# Patient Record
Sex: Female | Born: 1950
Health system: Southern US, Community
[De-identification: ages and names within clinical notes are randomized; demographics above are authoritative.]

## PROBLEM LIST (undated history)

## (undated) DIAGNOSIS — E079 Disorder of thyroid, unspecified: Secondary | ICD-10-CM

## (undated) DIAGNOSIS — E785 Hyperlipidemia, unspecified: Secondary | ICD-10-CM

## (undated) DIAGNOSIS — G709 Myoneural disorder, unspecified: Secondary | ICD-10-CM

## (undated) DIAGNOSIS — K219 Gastro-esophageal reflux disease without esophagitis: Secondary | ICD-10-CM

## (undated) DIAGNOSIS — H409 Unspecified glaucoma: Secondary | ICD-10-CM

## (undated) DIAGNOSIS — M1990 Unspecified osteoarthritis, unspecified site: Secondary | ICD-10-CM

## (undated) DIAGNOSIS — D259 Leiomyoma of uterus, unspecified: Secondary | ICD-10-CM

## (undated) DIAGNOSIS — H269 Unspecified cataract: Secondary | ICD-10-CM

## (undated) DIAGNOSIS — I10 Essential (primary) hypertension: Secondary | ICD-10-CM

## (undated) DIAGNOSIS — N189 Chronic kidney disease, unspecified: Secondary | ICD-10-CM

## (undated) DIAGNOSIS — E119 Type 2 diabetes mellitus without complications: Secondary | ICD-10-CM

## (undated) DIAGNOSIS — R87629 Unspecified abnormal cytological findings in specimens from vagina: Secondary | ICD-10-CM

## (undated) HISTORY — DX: Disorder of thyroid, unspecified: E07.9

## (undated) HISTORY — PX: COLONOSCOPY: SHX174

## (undated) HISTORY — DX: Unspecified cataract: H26.9

## (undated) HISTORY — DX: Hyperlipidemia, unspecified: E78.5

## (undated) HISTORY — DX: Unspecified glaucoma: H40.9

## (undated) HISTORY — DX: Unspecified osteoarthritis, unspecified site: M19.90

## (undated) HISTORY — DX: Unspecified abnormal cytological findings in specimens from vagina: R87.629

## (undated) HISTORY — DX: Myoneural disorder, unspecified: G70.9

## (undated) HISTORY — DX: Chronic kidney disease, unspecified: N18.9

## (undated) HISTORY — DX: Type 2 diabetes mellitus without complications: E11.9

---

## 2002-07-30 ENCOUNTER — Encounter: Payer: Self-pay | Admitting: Internal Medicine

## 2002-07-30 ENCOUNTER — Encounter: Admission: RE | Admit: 2002-07-30 | Discharge: 2002-07-30 | Payer: Self-pay | Admitting: Internal Medicine

## 2003-08-16 ENCOUNTER — Emergency Department (HOSPITAL_COMMUNITY): Admission: EM | Admit: 2003-08-16 | Discharge: 2003-08-17 | Payer: Self-pay | Admitting: Emergency Medicine

## 2004-05-13 ENCOUNTER — Emergency Department (HOSPITAL_COMMUNITY): Admission: EM | Admit: 2004-05-13 | Discharge: 2004-05-13 | Payer: Self-pay | Admitting: Emergency Medicine

## 2004-05-15 ENCOUNTER — Emergency Department (HOSPITAL_COMMUNITY): Admission: EM | Admit: 2004-05-15 | Discharge: 2004-05-15 | Payer: Self-pay | Admitting: Emergency Medicine

## 2004-05-19 ENCOUNTER — Emergency Department (HOSPITAL_COMMUNITY): Admission: EM | Admit: 2004-05-19 | Discharge: 2004-05-19 | Payer: Self-pay | Admitting: Emergency Medicine

## 2005-05-28 ENCOUNTER — Encounter: Admission: RE | Admit: 2005-05-28 | Discharge: 2005-05-28 | Payer: Self-pay | Admitting: Internal Medicine

## 2007-05-28 ENCOUNTER — Emergency Department (HOSPITAL_COMMUNITY): Admission: EM | Admit: 2007-05-28 | Discharge: 2007-05-28 | Payer: Self-pay | Admitting: Emergency Medicine

## 2007-07-07 ENCOUNTER — Encounter: Admission: RE | Admit: 2007-07-07 | Discharge: 2007-07-07 | Payer: Self-pay | Admitting: Internal Medicine

## 2008-08-14 ENCOUNTER — Encounter: Admission: RE | Admit: 2008-08-14 | Discharge: 2008-08-14 | Payer: Self-pay | Admitting: Obstetrics

## 2008-12-14 ENCOUNTER — Inpatient Hospital Stay (HOSPITAL_COMMUNITY): Admission: AD | Admit: 2008-12-14 | Discharge: 2008-12-14 | Payer: Self-pay | Admitting: Obstetrics

## 2008-12-14 ENCOUNTER — Ambulatory Visit: Payer: Self-pay | Admitting: Family

## 2009-02-28 ENCOUNTER — Emergency Department (HOSPITAL_COMMUNITY): Admission: EM | Admit: 2009-02-28 | Discharge: 2009-02-28 | Payer: Self-pay | Admitting: Emergency Medicine

## 2009-09-03 ENCOUNTER — Encounter: Admission: RE | Admit: 2009-09-03 | Discharge: 2009-09-03 | Payer: Self-pay | Admitting: Obstetrics

## 2010-06-04 LAB — CBC
Hemoglobin: 12.2 g/dL (ref 12.0–15.0)
RBC: 3.83 MIL/uL — ABNORMAL LOW (ref 3.87–5.11)
WBC: 5.2 10*3/uL (ref 4.0–10.5)

## 2010-07-11 ENCOUNTER — Inpatient Hospital Stay (HOSPITAL_COMMUNITY)
Admission: AD | Admit: 2010-07-11 | Discharge: 2010-07-11 | Disposition: A | Discharge status: Home / Self Care | Payer: 59 | Source: Ambulatory Visit | Attending: Obstetrics & Gynecology | Admitting: Obstetrics & Gynecology

## 2010-07-11 ENCOUNTER — Inpatient Hospital Stay (HOSPITAL_COMMUNITY): Payer: 59

## 2010-07-11 DIAGNOSIS — D259 Leiomyoma of uterus, unspecified: Secondary | ICD-10-CM

## 2010-07-11 DIAGNOSIS — R109 Unspecified abdominal pain: Secondary | ICD-10-CM

## 2010-07-11 LAB — CBC
HCT: 37.4 % (ref 36.0–46.0)
MCHC: 32.6 g/dL (ref 30.0–36.0)
Platelets: 221 10*3/uL (ref 150–400)
RDW: 14.6 % (ref 11.5–15.5)
WBC: 7.1 10*3/uL (ref 4.0–10.5)

## 2010-07-11 LAB — URINALYSIS, ROUTINE W REFLEX MICROSCOPIC
Glucose, UA: NEGATIVE mg/dL
Specific Gravity, Urine: 1.02 (ref 1.005–1.030)
pH: 6 (ref 5.0–8.0)

## 2010-07-11 LAB — URINE MICROSCOPIC-ADD ON

## 2010-08-30 ENCOUNTER — Inpatient Hospital Stay (HOSPITAL_COMMUNITY)
Admission: AD | Admit: 2010-08-30 | Discharge: 2010-08-30 | Disposition: A | Discharge status: Home / Self Care | Payer: 59 | Source: Ambulatory Visit | Attending: Obstetrics | Admitting: Obstetrics

## 2010-08-30 DIAGNOSIS — M25559 Pain in unspecified hip: Secondary | ICD-10-CM | POA: Insufficient documentation

## 2010-08-30 DIAGNOSIS — W19XXXA Unspecified fall, initial encounter: Secondary | ICD-10-CM | POA: Insufficient documentation

## 2010-09-22 ENCOUNTER — Other Ambulatory Visit: Payer: Self-pay | Admitting: Obstetrics

## 2010-09-22 DIAGNOSIS — Z1231 Encounter for screening mammogram for malignant neoplasm of breast: Secondary | ICD-10-CM

## 2010-10-02 ENCOUNTER — Ambulatory Visit
Admission: RE | Admit: 2010-10-02 | Discharge: 2010-10-02 | Disposition: A | Discharge status: Home / Self Care | Payer: 59 | Source: Ambulatory Visit | Attending: Obstetrics | Admitting: Obstetrics

## 2010-10-02 DIAGNOSIS — Z1231 Encounter for screening mammogram for malignant neoplasm of breast: Secondary | ICD-10-CM

## 2011-09-14 ENCOUNTER — Other Ambulatory Visit: Payer: Self-pay | Admitting: Internal Medicine

## 2011-09-14 DIAGNOSIS — Z780 Asymptomatic menopausal state: Secondary | ICD-10-CM

## 2011-09-16 ENCOUNTER — Ambulatory Visit
Admission: RE | Admit: 2011-09-16 | Discharge: 2011-09-16 | Disposition: A | Discharge status: Home / Self Care | Payer: 59 | Source: Ambulatory Visit | Attending: Internal Medicine | Admitting: Internal Medicine

## 2011-09-16 DIAGNOSIS — Z780 Asymptomatic menopausal state: Secondary | ICD-10-CM

## 2012-01-07 ENCOUNTER — Emergency Department (HOSPITAL_COMMUNITY)
Admission: EM | Admit: 2012-01-07 | Discharge: 2012-01-08 | Disposition: A | Discharge status: Home / Self Care | Payer: 59 | Attending: Emergency Medicine | Admitting: Emergency Medicine

## 2012-01-07 ENCOUNTER — Encounter (HOSPITAL_COMMUNITY): Payer: Self-pay | Admitting: Emergency Medicine

## 2012-01-07 DIAGNOSIS — J3489 Other specified disorders of nose and nasal sinuses: Secondary | ICD-10-CM | POA: Insufficient documentation

## 2012-01-07 DIAGNOSIS — R5381 Other malaise: Secondary | ICD-10-CM | POA: Insufficient documentation

## 2012-01-07 DIAGNOSIS — J029 Acute pharyngitis, unspecified: Secondary | ICD-10-CM | POA: Insufficient documentation

## 2012-01-07 DIAGNOSIS — I10 Essential (primary) hypertension: Secondary | ICD-10-CM | POA: Insufficient documentation

## 2012-01-07 DIAGNOSIS — R51 Headache: Secondary | ICD-10-CM | POA: Insufficient documentation

## 2012-01-07 DIAGNOSIS — J069 Acute upper respiratory infection, unspecified: Secondary | ICD-10-CM | POA: Insufficient documentation

## 2012-01-07 HISTORY — DX: Essential (primary) hypertension: I10

## 2012-01-07 NOTE — ED Notes (Signed)
C/o headache, non-productive cough, and body aches x 1 1/2 weeks.  Denies fever, nausea, and vomiting.

## 2012-01-08 MED ORDER — CETIRIZINE-PSEUDOEPHEDRINE ER 5-120 MG PO TB12: 1.0000 | ORAL_TABLET | Freq: Every day | ORAL | Status: DC

## 2012-01-08 MED ORDER — GUAIFENESIN ER 600 MG PO TB12: 1200.0000 mg | ORAL_TABLET | Freq: Two times a day (BID) | ORAL | Status: DC

## 2012-01-08 NOTE — ED Notes (Signed)
PA to bedside

## 2012-01-08 NOTE — ED Provider Notes (Signed)
History     CSN: [id number]  Arrival date & time 01/07/12  2337   First MD Initiated Contact with Patient 01/07/12 2343      Chief Complaint  Patient presents with  . Headache  . Cough   HPI  History provided by the patient. Patient is a 60 year old female with history of hypertension who presents with multiple complaints of cough, congestion, bodyaches and headache for the past week. Came on gradually and have been persistent. Patient has used Alka-Seltzer cold medicine occasionally without complete relief of symptoms. She denies any known sick contacts. Denies any recent travel. She denies any associated fever, chills, nausea or vomiting symptoms. Any chest pain or shortness of breath. Patient is nonsmoker. No prior history of asthma or bronchitis.     Past Medical History  Diagnosis Date  . Hypertension     History reviewed. No pertinent past surgical history.  No family history on file.  History  Substance Use Topics  . Smoking status: Never Smoker   . Smokeless tobacco: Not on file  . Alcohol Use: No    OB History    Grav Para Term Preterm Abortions TAB SAB Ect Mult Living                  Review of Systems  Constitutional: Positive for fatigue. Negative for fever and chills.  HENT: Positive for congestion, sore throat, rhinorrhea and sinus pressure.   Respiratory: Positive for cough. Negative for shortness of breath and wheezing.   Cardiovascular: Negative for chest pain.  Gastrointestinal: Negative for nausea, vomiting, abdominal pain and diarrhea.  Skin: Negative for rash.  Neurological: Positive for headaches.    Allergies  Review of patient's allergies indicates no known allergies.  Home Medications   Current Outpatient Rx  Name  Route  Sig  Dispense  Refill  . HYDROCHLOROTHIAZIDE 25 MG PO TABS   Oral   Take 25 mg by mouth daily.           BP 150/85  Pulse 89  Temp 99.1 F (37.3 C) (Oral)  Resp 18  SpO2 98%  Physical Exam  Nursing  note and vitals reviewed. Constitutional: She is oriented to person, place, and time. She appears well-developed and well-nourished. No distress.  HENT:  Head: Normocephalic and atraumatic.  Right Ear: Tympanic membrane normal.  Left Ear: Tympanic membrane normal.  Mouth/Throat: Oropharynx is clear and moist.  Neck: Normal range of motion. Neck supple.       No meningeal signs  Cardiovascular: Normal rate and regular rhythm.   No murmur heard. Pulmonary/Chest: Effort normal and breath sounds normal. No respiratory distress. She has no wheezes. She has no rales.  Abdominal: Soft. There is no tenderness. There is no rebound and no guarding.  Musculoskeletal: She exhibits no edema and no tenderness.  Lymphadenopathy:    She has no cervical adenopathy.  Neurological: She is alert and oriented to person, place, and time.  Skin: Skin is warm and dry. No rash noted.  Psychiatric: She has a normal mood and affect. Her behavior is normal.    ED Course  Procedures       1. URI (upper respiratory infection)       MDM  Patient seen and evaluated. Patient well-appearing and nontoxic. No concerning arrhythmia symptoms her headache.        Angus Seller, [place] 01/09/12 [phone]

## 2012-01-09 NOTE — ED Provider Notes (Signed)
Medical screening examination/treatment/procedure(s) were performed by non-physician practitioner and as supervising physician I was immediately available for consultation/collaboration.  Tobin Chad, MD 01/09/12 [phone]

## 2012-02-21 ENCOUNTER — Other Ambulatory Visit: Payer: Self-pay | Admitting: Internal Medicine

## 2012-02-21 ENCOUNTER — Other Ambulatory Visit: Payer: Self-pay | Admitting: Obstetrics

## 2012-02-21 DIAGNOSIS — Z1231 Encounter for screening mammogram for malignant neoplasm of breast: Secondary | ICD-10-CM

## 2012-03-09 ENCOUNTER — Ambulatory Visit
Admission: RE | Admit: 2012-03-09 | Discharge: 2012-03-09 | Disposition: A | Discharge status: Home / Self Care | Payer: 59 | Source: Ambulatory Visit | Attending: Internal Medicine | Admitting: Internal Medicine

## 2012-03-09 DIAGNOSIS — Z1231 Encounter for screening mammogram for malignant neoplasm of breast: Secondary | ICD-10-CM

## 2012-11-09 ENCOUNTER — Ambulatory Visit: Payer: Self-pay | Admitting: Obstetrics

## 2013-02-09 ENCOUNTER — Other Ambulatory Visit: Payer: Self-pay | Admitting: Internal Medicine

## 2013-02-09 DIAGNOSIS — Z1231 Encounter for screening mammogram for malignant neoplasm of breast: Secondary | ICD-10-CM

## 2013-02-19 ENCOUNTER — Ambulatory Visit
Admission: RE | Admit: 2013-02-19 | Discharge: 2013-02-19 | Disposition: A | Discharge status: Home / Self Care | Payer: 59 | Source: Ambulatory Visit | Attending: Internal Medicine | Admitting: Internal Medicine

## 2013-02-19 DIAGNOSIS — Z1231 Encounter for screening mammogram for malignant neoplasm of breast: Secondary | ICD-10-CM

## 2013-04-17 ENCOUNTER — Ambulatory Visit: Payer: 59 | Admitting: Obstetrics

## 2013-04-24 ENCOUNTER — Ambulatory Visit: Payer: 59 | Admitting: Obstetrics

## 2013-05-02 ENCOUNTER — Encounter: Payer: Self-pay | Admitting: Obstetrics

## 2013-05-02 ENCOUNTER — Ambulatory Visit (INDEPENDENT_AMBULATORY_CARE_PROVIDER_SITE_OTHER): Payer: 59 | Admitting: Obstetrics

## 2013-05-02 VITALS — BP 153/97 | HR 77 | Temp 98.6°F | Ht 63.0 in | Wt 188.0 lb

## 2013-05-02 DIAGNOSIS — Z113 Encounter for screening for infections with a predominantly sexual mode of transmission: Secondary | ICD-10-CM

## 2013-05-02 DIAGNOSIS — Z01419 Encounter for gynecological examination (general) (routine) without abnormal findings: Secondary | ICD-10-CM

## 2013-05-02 DIAGNOSIS — Z124 Encounter for screening for malignant neoplasm of cervix: Secondary | ICD-10-CM

## 2013-05-02 NOTE — Progress Notes (Signed)
Subjective:     Kristi Campos is a 63 y.o. female here for a routine exam.  Current complaints: Patient is in the office today for an Annual Exam. Patient state she would like her colon checked. Patient states she would like to have her cholesterol checked.   Personal health questionnaire reviewed: yes.   Gynecologic History No LMP recorded. Patient is postmenopausal. Contraception: post menopausal status Last Pap: 05/14/2011. Results were: abnormal ([place]) Last mammogram: 2014. Results were: normal  Obstetric History OB History  Gravida Para Term Preterm AB SAB TAB Ectopic Multiple Living  0 0 0 0 0 0 0 0 0 0          The following portions of the patient's history were reviewed and updated as appropriate: allergies, current medications, past family history, past medical history, past social history, past surgical history and problem list.  Review of Systems Pertinent items are noted in HPI.    Objective:    General appearance: alert and no distress Breasts: normal appearance, no masses or tenderness Abdomen: normal findings: soft, non-tender Pelvic: external genitalia normal, no adnexal masses or tenderness, no cervical motion tenderness, rectovaginal septum normal, uterus normal size, shape, and consistency and vagina normal without discharge  cervical os stenosed.  Assessment:    Healthy female exam.    Plan:    Follow up in: 1 year.

## 2013-05-03 LAB — GC/CHLAMYDIA PROBE AMP
CT Probe RNA: NEGATIVE
GC PROBE AMP APTIMA: NEGATIVE

## 2013-05-03 LAB — WET PREP BY MOLECULAR PROBE
CANDIDA SPECIES: NEGATIVE
Gardnerella vaginalis: POSITIVE — AB
Trichomonas vaginosis: NEGATIVE

## 2013-05-04 LAB — PAP IG W/ RFLX HPV ASCU

## 2013-05-08 ENCOUNTER — Other Ambulatory Visit: Payer: Self-pay | Admitting: *Deleted

## 2013-05-08 DIAGNOSIS — N760 Acute vaginitis: Secondary | ICD-10-CM

## 2013-05-08 DIAGNOSIS — B9689 Other specified bacterial agents as the cause of diseases classified elsewhere: Secondary | ICD-10-CM

## 2013-05-08 MED ORDER — METRONIDAZOLE 500 MG PO TABS
500.0000 mg | ORAL_TABLET | Freq: Two times a day (BID) | ORAL | Status: DC
Start: 2013-05-08 — End: 2013-07-03

## 2013-06-15 ENCOUNTER — Other Ambulatory Visit: Payer: Self-pay | Admitting: Internal Medicine

## 2013-06-15 DIAGNOSIS — E2839 Other primary ovarian failure: Secondary | ICD-10-CM

## 2013-07-03 ENCOUNTER — Emergency Department (HOSPITAL_COMMUNITY): Payer: 59

## 2013-07-03 ENCOUNTER — Emergency Department (HOSPITAL_COMMUNITY)
Admission: EM | Admit: 2013-07-03 | Discharge: 2013-07-03 | Disposition: A | Discharge status: Home / Self Care | Payer: 59 | Attending: Emergency Medicine | Admitting: Emergency Medicine

## 2013-07-03 ENCOUNTER — Encounter (HOSPITAL_COMMUNITY): Payer: Self-pay | Admitting: Emergency Medicine

## 2013-07-03 DIAGNOSIS — K5900 Constipation, unspecified: Secondary | ICD-10-CM | POA: Insufficient documentation

## 2013-07-03 DIAGNOSIS — Z79899 Other long term (current) drug therapy: Secondary | ICD-10-CM | POA: Insufficient documentation

## 2013-07-03 DIAGNOSIS — I10 Essential (primary) hypertension: Secondary | ICD-10-CM | POA: Insufficient documentation

## 2013-07-03 DIAGNOSIS — Z7982 Long term (current) use of aspirin: Secondary | ICD-10-CM | POA: Insufficient documentation

## 2013-07-03 LAB — CBC WITH DIFFERENTIAL/PLATELET
BASOS ABS: 0 10*3/uL (ref 0.0–0.1)
Basophils Relative: 0 % (ref 0–1)
EOS ABS: 0.1 10*3/uL (ref 0.0–0.7)
EOS PCT: 2 % (ref 0–5)
HCT: 37.5 % (ref 36.0–46.0)
Hemoglobin: 12.8 g/dL (ref 12.0–15.0)
LYMPHS ABS: 1.7 10*3/uL (ref 0.7–4.0)
Lymphocytes Relative: 25 % (ref 12–46)
MCH: 31.3 pg (ref 26.0–34.0)
MCHC: 34.1 g/dL (ref 30.0–36.0)
MCV: 91.7 fL (ref 78.0–100.0)
Monocytes Absolute: 0.5 10*3/uL (ref 0.1–1.0)
Monocytes Relative: 8 % (ref 3–12)
NEUTROS PCT: 65 % (ref 43–77)
Neutro Abs: 4.4 10*3/uL (ref 1.7–7.7)
PLATELETS: 258 10*3/uL (ref 150–400)
RBC: 4.09 MIL/uL (ref 3.87–5.11)
RDW: 14.8 % (ref 11.5–15.5)
WBC: 6.7 10*3/uL (ref 4.0–10.5)

## 2013-07-03 LAB — COMPREHENSIVE METABOLIC PANEL
ALBUMIN: 3.6 g/dL (ref 3.5–5.2)
ALK PHOS: 70 U/L (ref 39–117)
ALT: 23 U/L (ref 0–35)
AST: 24 U/L (ref 0–37)
BUN: 31 mg/dL — ABNORMAL HIGH (ref 6–23)
CALCIUM: 9.6 mg/dL (ref 8.4–10.5)
CO2: 22 mEq/L (ref 19–32)
Chloride: 104 mEq/L (ref 96–112)
Creatinine, Ser: 0.94 mg/dL (ref 0.50–1.10)
GFR calc Af Amer: 73 mL/min — ABNORMAL LOW (ref >90)
GFR calc non Af Amer: 63 mL/min — ABNORMAL LOW (ref >90)
Glucose, Bld: 152 mg/dL — ABNORMAL HIGH (ref 70–99)
Potassium: 3.9 mEq/L (ref 3.7–5.3)
SODIUM: 140 meq/L (ref 137–147)
TOTAL PROTEIN: 7.9 g/dL (ref 6.0–8.3)
Total Bilirubin: 0.4 mg/dL (ref 0.3–1.2)

## 2013-07-03 LAB — URINALYSIS, ROUTINE W REFLEX MICROSCOPIC
BILIRUBIN URINE: NEGATIVE
GLUCOSE, UA: NEGATIVE mg/dL
HGB URINE DIPSTICK: NEGATIVE
KETONES UR: NEGATIVE mg/dL
Leukocytes, UA: NEGATIVE
Nitrite: NEGATIVE
PH: 5.5 (ref 5.0–8.0)
Protein, ur: NEGATIVE mg/dL
SPECIFIC GRAVITY, URINE: 1.023 (ref 1.005–1.030)
Urobilinogen, UA: 0.2 mg/dL (ref 0.0–1.0)

## 2013-07-03 MED ORDER — POLYETHYLENE GLYCOL 3350 17 G PO PACK: 17.0000 g | PACK | Freq: Two times a day (BID) | ORAL | Status: DC

## 2013-07-03 MED ORDER — DOCUSATE SODIUM 100 MG PO CAPS: 100.0000 mg | ORAL_CAPSULE | Freq: Two times a day (BID) | ORAL | Status: DC

## 2013-07-03 NOTE — ED Notes (Signed)
Pt. reports low abdominal pain onset 3 days ago , denies nausea, vomitting or diarrhea . No fever or chills.

## 2013-07-03 NOTE — Discharge Instructions (Signed)
Your abdominal xray showed constipation. Your urine was normal. You likely have pain from your constipation. Take miralax as directed until you are stooling normally. Take colace as needed as well. Stay hydrated and eat foods high in fiber. Seek immediate care if you develop worsening symptoms, nausea, vomiting, fever, or other concerns. Follow up with your PCP.   Constipation, Adult Constipation is when a person:  Poops (bowel movement) less than 3 times a week.  Has a hard time pooping.  Has poop that is dry, hard, or bigger than normal. HOME CARE   Eat more fiber, such as fruits, vegetables, whole grains like brown rice, and beans.  Eat less fatty foods and sugar. This includes [place] fries, hamburgers, cookies, candy, and soda.  If you are not getting enough fiber from food, take products with added fiber in them (supplements).  Drink enough fluid to keep your pee (urine) clear or pale yellow.  Go to the restroom when you feel like you need to poop. Do not hold it.  Only take medicine as told by your doctor. Do not take medicines that help you poop (laxatives) without talking to your doctor first.  Exercise on a regular basis, or as told by your doctor. GET HELP RIGHT AWAY IF:   You have bright red blood in your poop (stool).  Your constipation lasts more than 4 days or gets worse.  You have belly (abdomen) or butt (rectal) pain.  You have thin poop (as thin as a pencil).  You lose weight, and it cannot be explained. MAKE SURE YOU:   Understand these instructions.  Will watch your condition.  Will get help right away if you are not doing well or get worse. Document Released: 08/04/2007 Document Revised: 05/10/2011 Document Reviewed: 11/27/2012 [facility] Patient Information 2014 [place], [place].

## 2013-07-03 NOTE — ED Provider Notes (Signed)
CSN: [id number]     Arrival date & time 07/03/13  5732 History   First MD Initiated Contact with Patient 07/03/13 [phone]     Chief Complaint  Patient presents with  . Abdominal Pain   HPI  Bilateral lower abdominal pain for the past 3 days that is 5/10 and "throbbing." Patient denies fevers, chills, chest pain, dyspnea, diarrhea, vomiting/nausea, or dysuria. No radiation of pain to groin, back, or chest. No skin changes. No hematuria or bloody stool. She has had firm stools which is her baseline, with last stool yesterday that was firm and she had to strain to get out. Pain is worse with standing long periods of time (at work) but she otherwise notes no pattern with time of day or food intake. Denies vaginal bleeding and does not report any vaginal discharge. Only medicines are aspirin and HCTZ, with no recent new medicines.  Past Medical History  Diagnosis Date  . Hypertension   "Thyroid problem"  History reviewed. No pertinent past surgical history. No family history on file. History  Substance Use Topics  . Smoking status: Never Smoker   . Smokeless tobacco: Never Used  . Alcohol Use: No   OB History   Grav Para Term Preterm Abortions TAB SAB Ect Mult Living   0 0 0 0 0 0 0 0 0 0      Review of Systems Per HPI  Allergies  Review of patient's allergies indicates no known allergies.  Home Medications   Prior to Admission medications   Medication Sig Start Date End Date Taking? Authorizing Provider  aspirin EC 81 MG tablet Take 81 mg by mouth daily.   Yes Historical Provider, MD  hydrochlorothiazide (HYDRODIURIL) 25 MG tablet Take 25 mg by mouth daily.   Yes Historical Provider, MD   BP 114/77  Pulse 86  Temp(Src) 98 F (36.7 C) (Oral)  Ht 5\' 3"  (1.6 m)  Wt 185 lb (83.915 kg)  BMI 32.78 kg/m2  SpO2 100% Physical Exam GEN: NAD, pleasant HEENT: Atraumatic, normocephalic, neck supple, EOMI, sclera clear, o/p clear, MMM CV: RRR, no murmurs, rubs, or gallops PULM: CTAB,  normal effort ABD: Soft, nontender, nondistended, obese, NABS, no organomegaly, no suprapubic tenderenss SKIN: No rash or cyanosis; warm and well-perfused PSYCH: Mood and affect euthymic, normal rate and volume of speech NEURO: Awake, alert, no focal deficits grossly, normal speech, normal gait BACK: No CVA tenderness, no erythema or deformity  ED Course  Procedures (including critical care time) Labs Review Labs Reviewed  COMPREHENSIVE METABOLIC PANEL - Abnormal; Notable for the following:    Glucose, Bld 152 (*)    BUN 31 (*)    GFR calc non Af Amer 63 (*)    GFR calc Af Amer 73 (*)    All other components within normal limits  CBC WITH DIFFERENTIAL  URINALYSIS, ROUTINE W REFLEX MICROSCOPIC    Imaging Review Dg Abd 2 Views  07/03/2013   CLINICAL DATA:  Abdominal pain.  EXAM: ABDOMEN - 2 VIEW  COMPARISON:  None.  FINDINGS: Large stool burden throughout the colon. Nonobstructive bowel gas pattern. No free air organomegaly. No suspicious calcification. Visualized lung bases are clear. No acute bony abnormality.  IMPRESSION: Large stool burden.  No acute findings.   Electronically Signed   By: Rolm Baptise M.D.   On: 07/03/2013 09:18     EKG Interpretation None      MDM   Final diagnoses:  None   Subacute abdominal pain in pt with HTN  and hypothyroidism, Nonacute abdomen, no fevers/chills or other systemic symptoms, no nausea/vomiting/diarrhea to raise concern for infective process. DDx includes constipation, UTI (though no dysuria), viral gastroenteritis (though no diarrhea/vomiting or fever), abdominal ligament strain due to pannus, HHS (glucose only 152, no n/v). Normal CMET and WBC. -UA>>negative -ABd Xray>>large stool burden -D/c from ED with Colace+miralax daily prn until stooling normally, and increase fiber content -Core strengthening -F/u with PCP if no better  Hilton Sinclair, MD PGY-2, [facility]     Hilton Sinclair, MD 07/03/13  [phone]

## 2013-07-03 NOTE — ED Provider Notes (Signed)
I performed a history and physical examination of  Kristi Campos and discussed her management with resident physician. I agree with the history, physical, assessment, and plan of care, with the following exceptions: None I was present for the following procedures: None  Time Spent in Critical Care of the patient: None  Time spent in discussions with the patient and family: 15 min  Kobyn Kray Joni Fears, MD 07/04/13 1701

## 2013-07-04 NOTE — ED Provider Notes (Signed)
I saw and evaluated the patient, reviewed the resident's note and I agree with the findings and plan.   EKG Interpretation None      Pt with abd pain. Non peritoneal exam. Reports being constipated - and Xrays ordered, which confirm high stool burden. She is over all healthy, and her vitals are stable, so we don't see any reason to get CT scans.   Varney Biles, MD 07/04/13 [phone]

## 2013-08-09 ENCOUNTER — Ambulatory Visit (HOSPITAL_BASED_OUTPATIENT_CLINIC_OR_DEPARTMENT_OTHER)
Admission: RE | Admit: 2013-08-09 | Discharge: 2013-08-09 | Disposition: A | Discharge status: Home / Self Care | Payer: 59 | Source: Ambulatory Visit | Attending: Orthopedic Surgery | Admitting: Orthopedic Surgery

## 2013-08-09 ENCOUNTER — Encounter (HOSPITAL_BASED_OUTPATIENT_CLINIC_OR_DEPARTMENT_OTHER): Payer: Self-pay | Admitting: *Deleted

## 2013-08-09 ENCOUNTER — Encounter (HOSPITAL_BASED_OUTPATIENT_CLINIC_OR_DEPARTMENT_OTHER)
Admission: RE | Disposition: A | Discharge status: Home / Self Care | Payer: Self-pay | Source: Ambulatory Visit | Attending: Orthopedic Surgery

## 2013-08-09 DIAGNOSIS — L03019 Cellulitis of unspecified finger: Secondary | ICD-10-CM | POA: Insufficient documentation

## 2013-08-09 DIAGNOSIS — I10 Essential (primary) hypertension: Secondary | ICD-10-CM | POA: Insufficient documentation

## 2013-08-09 HISTORY — PX: I & D EXTREMITY: SHX5045

## 2013-08-09 SURGERY — MINOR IRRIGATION AND DEBRIDEMENT EXTREMITY
Anesthesia: LOCAL | Site: Finger | Laterality: Right

## 2013-08-09 MED ORDER — LIDOCAINE HCL 2 % IJ SOLN
INTRAMUSCULAR | Status: DC | PRN
  Administered 2013-08-09: 6 mL

## 2013-08-09 MED ORDER — ACETAMINOPHEN-CODEINE 300-30 MG PO TABS: ORAL_TABLET | ORAL | Status: DC

## 2013-08-09 MED ORDER — DOXYCYCLINE HYCLATE 100 MG PO TABS: 100.0000 mg | ORAL_TABLET | Freq: Two times a day (BID) | ORAL | Status: DC

## 2013-08-09 MED ORDER — METHYLPREDNISOLONE ACETATE 40 MG/ML IJ SUSP
INTRAMUSCULAR | Status: AC
  Filled 2013-08-09: qty 1

## 2013-08-09 SURGICAL SUPPLY — 51 items
BANDAGE ADH SHEER 1  50/CT (GAUZE/BANDAGES/DRESSINGS) IMPLANT
BANDAGE COBAN STERILE 2 (GAUZE/BANDAGES/DRESSINGS) IMPLANT
BLADE SURG 15 STRL LF DISP TIS (BLADE) ×1 IMPLANT
BLADE SURG 15 STRL SS (BLADE) ×2
BNDG CMPR 9X4 STRL LF SNTH (GAUZE/BANDAGES/DRESSINGS)
BNDG CMPR MD 5X2 ELC HKLP STRL (GAUZE/BANDAGES/DRESSINGS)
BNDG COHESIVE 1X5 TAN STRL LF (GAUZE/BANDAGES/DRESSINGS) ×2 IMPLANT
BNDG COHESIVE 4X5 TAN STRL (GAUZE/BANDAGES/DRESSINGS) IMPLANT
BNDG ELASTIC 2 VLCR STRL LF (GAUZE/BANDAGES/DRESSINGS) IMPLANT
BNDG ESMARK 4X9 LF (GAUZE/BANDAGES/DRESSINGS) IMPLANT
BRUSH SCRUB EZ PLAIN DRY (MISCELLANEOUS) ×2 IMPLANT
CORDS BIPOLAR (ELECTRODE) IMPLANT
COVER MAYO STAND STRL (DRAPES) ×2 IMPLANT
CUFF TOURNIQUET SINGLE 18IN (TOURNIQUET CUFF) IMPLANT
DECANTER SPIKE VIAL GLASS SM (MISCELLANEOUS) IMPLANT
DRAIN PENROSE 1/2X12 LTX STRL (WOUND CARE) ×2 IMPLANT
DRAIN PENROSE 1/4X12 LTX STRL (WOUND CARE) IMPLANT
DRAPE SURG 17X23 STRL (DRAPES) IMPLANT
DRAPE U 20/CS (DRAPES) IMPLANT
DRAPE U-SHAPE 47X51 STRL (DRAPES) IMPLANT
DRAPE U-SHAPE 76X120 STRL (DRAPES) IMPLANT
DRSG PAD ABDOMINAL 8X10 ST (GAUZE/BANDAGES/DRESSINGS) IMPLANT
GAUZE SPONGE 4X4 12PLY STRL (GAUZE/BANDAGES/DRESSINGS) IMPLANT
GAUZE XEROFORM 1X8 LF (GAUZE/BANDAGES/DRESSINGS) ×2 IMPLANT
GLOVE BIO SURGEON STRL SZ 6.5 (GLOVE) ×2 IMPLANT
GLOVE BIOGEL M STRL SZ7.5 (GLOVE) ×2 IMPLANT
GLOVE BIOGEL PI IND STRL 7.0 (GLOVE) ×1 IMPLANT
GLOVE BIOGEL PI INDICATOR 7.0 (GLOVE) ×1
GLOVE ECLIPSE 6.5 STRL STRAW (GLOVE) ×2 IMPLANT
GLOVE ORTHO TXT STRL SZ7.5 (GLOVE) ×2 IMPLANT
GOWN STRL REUS W/ TWL LRG LVL3 (GOWN DISPOSABLE) ×2 IMPLANT
GOWN STRL REUS W/ TWL XL LVL3 (GOWN DISPOSABLE) ×1 IMPLANT
GOWN STRL REUS W/TWL LRG LVL3 (GOWN DISPOSABLE) ×4
GOWN STRL REUS W/TWL XL LVL3 (GOWN DISPOSABLE) ×2
NEEDLE 27GAX1X1/2 (NEEDLE) ×2 IMPLANT
PACK BASIN DAY SURGERY FS (CUSTOM PROCEDURE TRAY) ×2 IMPLANT
PADDING CAST ABS 4INX4YD NS (CAST SUPPLIES)
PADDING CAST ABS COTTON 4X4 ST (CAST SUPPLIES) IMPLANT
SPONGE GAUZE 4X4 12PLY STER LF (GAUZE/BANDAGES/DRESSINGS) IMPLANT
STOCKINETTE 4X48 STRL (DRAPES) ×2 IMPLANT
STRIP CLOSURE SKIN 1/2X4 (GAUZE/BANDAGES/DRESSINGS) IMPLANT
SUT ETHILON 5 0 P 3 18 (SUTURE) ×1
SUT NYLON ETHILON 5-0 P-3 1X18 (SUTURE) ×1 IMPLANT
SUT PROLENE 3 0 PS 2 (SUTURE) IMPLANT
SWAB COLLECTION DEVICE MRSA (MISCELLANEOUS) ×2 IMPLANT
SYR 3ML 23GX1 SAFETY (SYRINGE) IMPLANT
SYR CONTROL 10ML LL (SYRINGE) ×2 IMPLANT
TOWEL OR 17X24 6PK STRL BLUE (TOWEL DISPOSABLE) ×4 IMPLANT
TRAY DSU PREP LF (CUSTOM PROCEDURE TRAY) ×2 IMPLANT
TUBE ANAEROBIC SPECIMEN COL (MISCELLANEOUS) ×2 IMPLANT
UNDERPAD 30X30 INCONTINENT (UNDERPADS AND DIAPERS) ×2 IMPLANT

## 2013-08-09 NOTE — Discharge Instructions (Signed)

## 2013-08-09 NOTE — H&P (Signed)
  [name] is a 62 year old right-hand-dominant female who telephoned our office on the morning of 08/09/2013. She stated that she had a finger that had become infected approximately 3-4 days ago. She has seen no other medical provider concerning this finger and requested that we see her and evaluate her for treatment of this infection. Patient states that approximately one week ago she the cuticle of the right finger several days later this became infected. This is apparently waking her up at night due to the pain. After telephoning [place] this morning we asked her to in day surgery Center for this infection.  Allergies: No known drug allergies Review of systems: Patient has no history of diabetes or thyroid disease. There is a history of hypertension which is treated. 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 She does not smoke or drink alcohol.  Exam: Examination of her right long finger reveals a stage III paronychia of the medial crura of the finger distal phalanx. There is no tenderness along the flexor tendon area just with motion of the MP and PIP joints. Vascular she is intact.  Cardiac: Patient has a regular rate and rhythm without murmur Lungs clear  Impression: Paronychia of right long finger  Plan: Patient to be taken to the operating room to undergo sedation and drainage with intraoperative cultures of the right long finger paronychia. The procedure risks benefits and postoperative course was discussed with the patient at length.  Julian Reil PA-C  H&P documentation: 08/09/2013  -History and Physical Reviewed  -Patient has been re-examined  -No change in the plan of care  Cammie Sickle, MD

## 2013-08-09 NOTE — Brief Op Note (Signed)
08/09/2013  4:31 PM  PATIENT:  Kristi Campos  63 y.o. female  PRE-OPERATIVE DIAGNOSIS:  Infected Right Long Finger  POST-OPERATIVE DIAGNOSIS:  infected right long finger  PROCEDURE:  Procedure(s) with comments: MINOR IRRIGATION AND DEBRIDEMENT EXTREMITY (Right) - long       wound class 4  SURGEON:  Surgeon(s) and Role:    * Cammie Sickle, MD - Primary  PHYSICIAN ASSISTANT:   ASSISTANTS: Kathyrn Sheriff.A-C    ANESTHESIA:   local  EBL:     BLOOD ADMINISTERED:none  DRAINS: xeroflo   LOCAL MEDICATIONS USED:  LIDOCAINE   SPECIMEN:  No Specimen  DISPOSITION OF SPECIMEN:  N/A  COUNTS:  YES  TOURNIQUET:    DICTATION: .Other Dictation: Dictation Number [id number]  PLAN OF CARE: Discharge to home after PACU  PATIENT DISPOSITION:  PACU - hemodynamically stable.   Delay start of Pharmacological VTE agent (>24hrs) due to surgical blood loss or risk of bleeding: not applicable

## 2013-08-09 NOTE — Op Note (Signed)
[id number] 

## 2013-08-10 NOTE — Op Note (Signed)
[name], [name]              ACCOUNT NO.:  [id number]  MEDICAL RECORD NO.:  [id number]  LOCATION:                                 FACILITY:  PHYSICIAN:  Youlanda Mighty. Gilberta Peeters, M.D. 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  OPERATION:  Incision and drainage of nail fold abscess, right long finger with aerobic and anaerobic cultures and Gram stain followed by placement of a Xeroflo drain.  OPERATING SURGEON:  Youlanda Mighty. Trenity Pha, M.D.  ASSISTANT:  Marily Lente. Dasnoit, PA-C  ANESTHESIA:  2% lidocaine metacarpal head level block of right long finger.  This was performed as a minor operating room procedure at the [facility].  INDICATIONS:  Kristi Campos is a 63 year old woman who was referred by her primary care physician for evaluation and management of a right long finger acute bacterial paronychia.  She had a large collection of purulent material along the radial nail wall of her right long finger. She was advised by our office to proceed directly to the [facility] for treatment by our on-call physician.  She was noted to have a stage III paronychia.  She reported that she was on hydrochlorothiazide for high blood pressure, did not have antibiotic allergies.  She had no treatment of her predicament with medications to date.  After detailed informed consent, her right long finger was marked as a proper surgical site per protocol with marking pen.  She was then transferred to room 1 of the [facility] and placed in a supine position upon the operating table.  Following Betadine prep of the right hand, 2% lidocaine was infiltrated at the metacarpal head level to obtain a digital block.  After 10 minutes,  excellent anesthesia was achieved.  The right hand and arm were then prepped with Betadine soap and solution, sterilely draped. The long finger was exsanguinated with direct compression and a 0.5-inch Penrose drain was placed at the proximal phalangeal segment as a digital tourniquet.  A routine surgical time-out was accomplished.  We then proceeded to place a Freer elevator deep to the nail plate.  Purulent material was immediately recovered.  This was cultured for aerobic and anaerobic growth and a Gram stain was obtained.  The radial 4 mm of nail plate was then removed beneath the nail fold. There was a large abscess cavity noted deep to the dorsal nail fold. There was also an area that extended proximally along the radial aspect of the finger stopping just short of the DIP joint capsule.  This was curetted and irrigated and subsequently drained with a wick of Xeroflo placed to the depth of the wound.  The wound was dressed open.  We then dressed the finger with Xeroflo, sterile gauze, and Coban wrist base dressing.  There were no apparent complications.  For aftercare, Kristi Campos was provided prescription for Tylenol with Codeine No. 3, one or two tablets p.o. q.4-6 hours p.r.n. pain, 20 tabs  without refill.  Also, she is provided a prescription for doxycycline 100 mg 1 p.o. b.i.d. x4 days as a therapeutic antibiotic.     Youlanda Mighty Sharol Croghan, M.D.     RVS/MEDQ  D:  08/09/2013  T:  08/10/2013  Job:  [id number]

## 2013-08-12 LAB — WOUND CULTURE

## 2013-08-13 ENCOUNTER — Encounter (HOSPITAL_BASED_OUTPATIENT_CLINIC_OR_DEPARTMENT_OTHER): Payer: Self-pay | Admitting: Orthopedic Surgery

## 2013-08-14 LAB — ANAEROBIC CULTURE

## 2013-09-17 ENCOUNTER — Encounter (INDEPENDENT_AMBULATORY_CARE_PROVIDER_SITE_OTHER): Payer: Self-pay

## 2013-09-17 ENCOUNTER — Ambulatory Visit
Admission: RE | Admit: 2013-09-17 | Discharge: 2013-09-17 | Disposition: A | Discharge status: Home / Self Care | Payer: 59 | Source: Ambulatory Visit | Attending: Internal Medicine | Admitting: Internal Medicine

## 2013-09-17 DIAGNOSIS — E2839 Other primary ovarian failure: Secondary | ICD-10-CM

## 2014-01-09 ENCOUNTER — Other Ambulatory Visit: Payer: Self-pay | Admitting: Internal Medicine

## 2014-05-06 ENCOUNTER — Ambulatory Visit: Payer: 59 | Admitting: Obstetrics

## 2014-05-21 ENCOUNTER — Other Ambulatory Visit: Payer: Self-pay

## 2014-05-21 DIAGNOSIS — Z1231 Encounter for screening mammogram for malignant neoplasm of breast: Secondary | ICD-10-CM

## 2014-05-24 ENCOUNTER — Ambulatory Visit
Admission: RE | Admit: 2014-05-24 | Discharge: 2014-05-24 | Disposition: A | Discharge status: Home / Self Care | Payer: 59 | Source: Ambulatory Visit

## 2014-05-24 DIAGNOSIS — Z1231 Encounter for screening mammogram for malignant neoplasm of breast: Secondary | ICD-10-CM

## 2014-10-30 ENCOUNTER — Encounter: Payer: Self-pay | Admitting: Obstetrics

## 2014-10-30 ENCOUNTER — Ambulatory Visit (INDEPENDENT_AMBULATORY_CARE_PROVIDER_SITE_OTHER): Payer: 59 | Admitting: Obstetrics

## 2014-10-30 VITALS — BP 129/87 | HR 76 | Temp 97.2°F | Ht 63.0 in | Wt 192.0 lb

## 2014-10-30 DIAGNOSIS — Z01419 Encounter for gynecological examination (general) (routine) without abnormal findings: Secondary | ICD-10-CM | POA: Diagnosis not present

## 2014-10-30 DIAGNOSIS — J029 Acute pharyngitis, unspecified: Secondary | ICD-10-CM | POA: Diagnosis not present

## 2014-10-30 DIAGNOSIS — Z124 Encounter for screening for malignant neoplasm of cervix: Secondary | ICD-10-CM

## 2014-10-30 DIAGNOSIS — R059 Cough, unspecified: Secondary | ICD-10-CM

## 2014-10-30 DIAGNOSIS — J069 Acute upper respiratory infection, unspecified: Secondary | ICD-10-CM | POA: Diagnosis not present

## 2014-10-30 DIAGNOSIS — R05 Cough: Secondary | ICD-10-CM

## 2014-10-30 LAB — POCT RAPID STREP A (OFFICE): Rapid Strep A Screen: NEGATIVE

## 2014-10-30 MED ORDER — GUAIFENESIN-CODEINE 100-10 MG/5ML PO SYRP: 5.0000 mL | ORAL_SOLUTION | Freq: Three times a day (TID) | ORAL | Status: DC | PRN

## 2014-10-30 MED ORDER — AZITHROMYCIN 250 MG PO TABS: ORAL_TABLET | ORAL | Status: DC

## 2014-10-30 NOTE — Progress Notes (Signed)
Subjective:        Kristi Campos is a 64 y.o. female here for a routine exam.  Current complaints: URI with sore throat.    Personal health questionnaire:  Is patient Ashkenazi Jewish, have a family history of breast and/or ovarian cancer: no Is there a family history of uterine cancer diagnosed at age < 29, gastrointestinal cancer, urinary tract cancer, family member who is a Field seismologist syndrome-associated carrier: no Is the patient overweight and hypertensive, family history of diabetes, personal history of gestational diabetes, preeclampsia or PCOS: no Is patient over 79, have PCOS,  family history of premature CHD under age 53, diabetes, smoke, have hypertension or peripheral artery disease:  no At any time, has a partner hit, kicked or otherwise hurt or frightened you?: no Over the past 2 weeks, have you felt down, depressed or hopeless?: no Over the past 2 weeks, have you felt little interest or pleasure in doing things?:no   Gynecologic History No LMP recorded. Patient is postmenopausal. Contraception: post menopausal status Last Pap: 2015. Results were: normal Last mammogram: 2016. Results were: normal  Obstetric History OB History  Gravida Para Term Preterm AB SAB TAB Ectopic Multiple Living  0 0 0 0 0 0 0 0 0 0         Past Medical History  Diagnosis Date  . Hypertension   . Diabetes mellitus without complication     Past Surgical History  Procedure Laterality Date  . I&d extremity Right 08/09/2013    Procedure: MINOR IRRIGATION AND DEBRIDEMENT EXTREMITY;  Surgeon: Cammie Sickle, MD;  Location: [facility];  Service: Orthopedics;  Laterality: Right;  long       wound class 4     Current outpatient prescriptions:  .  aspirin EC 81 MG tablet, Take 81 mg by mouth daily., Disp: , Rfl:  .  losartan-hydrochlorothiazide (HYZAAR) 100-12.5 MG per tablet, , Disp: , Rfl:  .  metFORMIN (GLUCOPHAGE) 500 MG tablet, Take 500 mg by mouth 2 (two) times daily  with a meal., Disp: , Rfl:  .  polyethylene glycol (MIRALAX / GLYCOLAX) packet, Take 17 g by mouth 2 (two) times daily. Until stooling regularly, Disp: 30 packet, Rfl: 0 No Known Allergies  Social History  Substance Use Topics  . Smoking status: Never Smoker   . Smokeless tobacco: Never Used  . Alcohol Use: No    History reviewed. No pertinent family history.    Review of Systems  Constitutional: negative for fatigue and weight loss Respiratory: negative for cough and wheezing Cardiovascular: negative for chest pain, fatigue and palpitations Gastrointestinal: negative for abdominal pain and change in bowel habits Musculoskeletal:negative for myalgias Neurological: negative for gait problems and tremors Behavioral/Psych: negative for abusive relationship, depression Endocrine: negative for temperature intolerance   Genitourinary:negative for abnormal menstrual periods, genital lesions, hot flashes, sexual problems and vaginal discharge Integument/breast: negative for breast lump, breast tenderness, nipple discharge and skin lesion(s)    Objective:       BP 129/87 mmHg  Pulse 76  Temp(Src) 97.2 F (36.2 C)  Ht 5\' 3"  (1.6 m)  Wt 192 lb (87.091 kg)  BMI 34.02 kg/m2 General:   alert  Skin:   no rash or abnormalities  Lungs:   clear to auscultation bilaterally  Heart:   regular rate and rhythm, S1, S2 normal, no murmur, click, rub or gallop  Breasts:   normal without suspicious masses, skin or nipple changes or axillary nodes  Abdomen:  normal  findings: no organomegaly, soft, non-tender and no hernia  Pelvis:  External genitalia: normal general appearance Urinary system: urethral meatus normal and bladder without fullness, nontender Vaginal: normal without tenderness, induration or masses Cervix: normal appearance Adnexa: normal bimanual exam Uterus: anteverted and non-tender, normal size   Lab Review Urine pregnancy test Labs reviewed yes Radiologic studies reviewed  yes    Assessment:    Healthy female exam.    Plan:    Education reviewed: calcium supplements, low fat, low cholesterol diet, self breast exams and weight bearing exercise. Follow up in: 1 year.   Meds ordered this encounter  Medications  . metFORMIN (GLUCOPHAGE) 500 MG tablet    Sig: Take 500 mg by mouth 2 (two) times daily with a meal.  . losartan-hydrochlorothiazide (HYZAAR) 100-12.5 MG per tablet    Sig:    Orders Placed This Encounter  Procedures  . POCT rapid strep A

## 2014-11-01 LAB — PAP, TP IMAGING W/ HPV RNA, RFLX HPV TYPE 16,18/45: HPV mRNA, High Risk: NOT DETECTED

## 2014-11-04 LAB — SURESWAB, VAGINOSIS/VAGINITIS PLUS
Atopobium vaginae: NOT DETECTED Log (cells/mL)
C. TROPICALIS, DNA: NOT DETECTED
C. albicans, DNA: NOT DETECTED
C. glabrata, DNA: NOT DETECTED
C. parapsilosis, DNA: NOT DETECTED
C. trachomatis RNA, TMA: NOT DETECTED
GARDNERELLA VAGINALIS: NOT DETECTED Log (cells/mL)
LACTOBACILLUS SPECIES: 7.6 Log (cells/mL)
MEGASPHAERA SPECIES: NOT DETECTED Log (cells/mL)
N. gonorrhoeae RNA, TMA: NOT DETECTED
T. vaginalis RNA, QL TMA: NOT DETECTED

## 2014-12-18 ENCOUNTER — Encounter (HOSPITAL_COMMUNITY): Payer: Self-pay | Admitting: Emergency Medicine

## 2014-12-18 ENCOUNTER — Emergency Department (HOSPITAL_COMMUNITY)
Admission: EM | Admit: 2014-12-18 | Discharge: 2014-12-19 | Disposition: A | Discharge status: Home / Self Care | Payer: 59 | Attending: Emergency Medicine | Admitting: Emergency Medicine

## 2014-12-18 DIAGNOSIS — E119 Type 2 diabetes mellitus without complications: Secondary | ICD-10-CM | POA: Insufficient documentation

## 2014-12-18 DIAGNOSIS — Z86018 Personal history of other benign neoplasm: Secondary | ICD-10-CM | POA: Diagnosis not present

## 2014-12-18 DIAGNOSIS — R1031 Right lower quadrant pain: Secondary | ICD-10-CM | POA: Diagnosis not present

## 2014-12-18 DIAGNOSIS — Z7982 Long term (current) use of aspirin: Secondary | ICD-10-CM | POA: Diagnosis not present

## 2014-12-18 DIAGNOSIS — I10 Essential (primary) hypertension: Secondary | ICD-10-CM | POA: Insufficient documentation

## 2014-12-18 DIAGNOSIS — Z79899 Other long term (current) drug therapy: Secondary | ICD-10-CM | POA: Insufficient documentation

## 2014-12-18 DIAGNOSIS — R1032 Left lower quadrant pain: Secondary | ICD-10-CM | POA: Diagnosis not present

## 2014-12-18 DIAGNOSIS — R109 Unspecified abdominal pain: Secondary | ICD-10-CM

## 2014-12-18 HISTORY — DX: Leiomyoma of uterus, unspecified: D25.9

## 2014-12-18 LAB — COMPREHENSIVE METABOLIC PANEL
ALK PHOS: 72 U/L (ref 38–126)
ALT: 16 U/L (ref 14–54)
AST: 19 U/L (ref 15–41)
Albumin: 3.6 g/dL (ref 3.5–5.0)
Anion gap: 9 (ref 5–15)
BILIRUBIN TOTAL: 0.4 mg/dL (ref 0.3–1.2)
BUN: 29 mg/dL — ABNORMAL HIGH (ref 6–20)
CALCIUM: 9.5 mg/dL (ref 8.9–10.3)
CO2: 26 mmol/L (ref 22–32)
CREATININE: 1.25 mg/dL — AB (ref 0.44–1.00)
Chloride: 99 mmol/L — ABNORMAL LOW (ref 101–111)
GFR calc Af Amer: 52 mL/min — ABNORMAL LOW (ref >60)
GFR, EST NON AFRICAN AMERICAN: 44 mL/min — AB (ref >60)
Glucose, Bld: 129 mg/dL — ABNORMAL HIGH (ref 65–99)
Potassium: 4 mmol/L (ref 3.5–5.1)
Sodium: 134 mmol/L — ABNORMAL LOW (ref 135–145)
TOTAL PROTEIN: 7.4 g/dL (ref 6.5–8.1)

## 2014-12-18 LAB — CBC
HCT: 35.6 % — ABNORMAL LOW (ref 36.0–46.0)
Hemoglobin: 11.8 g/dL — ABNORMAL LOW (ref 12.0–15.0)
MCH: 30.6 pg (ref 26.0–34.0)
MCHC: 33.1 g/dL (ref 30.0–36.0)
MCV: 92.2 fL (ref 78.0–100.0)
PLATELETS: 271 10*3/uL (ref 150–400)
RBC: 3.86 MIL/uL — AB (ref 3.87–5.11)
RDW: 14.6 % (ref 11.5–15.5)
WBC: 7.5 10*3/uL (ref 4.0–10.5)

## 2014-12-18 LAB — URINALYSIS, ROUTINE W REFLEX MICROSCOPIC
Bilirubin Urine: NEGATIVE
Glucose, UA: NEGATIVE mg/dL
Hgb urine dipstick: NEGATIVE
KETONES UR: NEGATIVE mg/dL
LEUKOCYTES UA: NEGATIVE
NITRITE: NEGATIVE
PROTEIN: NEGATIVE mg/dL
Specific Gravity, Urine: 1.024 (ref 1.005–1.030)
UROBILINOGEN UA: 0.2 mg/dL (ref 0.0–1.0)
pH: 5 (ref 5.0–8.0)

## 2014-12-18 LAB — LIPASE, BLOOD: Lipase: 25 U/L (ref 22–51)

## 2014-12-18 NOTE — ED Notes (Signed)
Pt. reports low abdominal pain onset last week , denies nausea or vomitting , no diarrhea or fever .

## 2014-12-18 NOTE — ED Notes (Signed)
MD at bedside. 

## 2014-12-19 MED ORDER — TRAMADOL HCL 50 MG PO TABS: 50.0000 mg | ORAL_TABLET | Freq: Four times a day (QID) | ORAL | Status: DC | PRN

## 2014-12-19 NOTE — Discharge Instructions (Signed)
Follow up with your md next week.  Return if getting worse

## 2014-12-23 NOTE — ED Provider Notes (Signed)
CSN: [id number]     Arrival date & time 12/18/14  2228 History   First MD Initiated Contact with Patient 12/18/14 2332     Chief Complaint  Patient presents with  . Abdominal Pain     (Consider location/radiation/quality/duration/timing/severity/associated sxs/prior Treatment) Patient is a 64 y.o. female presenting with abdominal pain. The history is provided by the patient (pt complains of intermittent lower abd pain).  Abdominal Pain Pain location:  LLQ and RLQ Pain quality: aching   Pain radiates to:  Does not radiate Pain severity:  Mild Onset quality:  Sudden Timing:  Intermittent Chronicity:  New Context: not alcohol use   Associated symptoms: no chest pain, no cough, no diarrhea, no fatigue and no hematuria     Past Medical History  Diagnosis Date  . Hypertension   . Diabetes mellitus without complication ([place])   . Fibroid uterus    Past Surgical History  Procedure Laterality Date  . I&d extremity Right 08/09/2013    Procedure: MINOR IRRIGATION AND DEBRIDEMENT EXTREMITY;  Surgeon: Cammie Sickle, MD;  Location: [place];  Service: Orthopedics;  Laterality: Right;  long       wound class 4   No family history on file. Social History  Substance Use Topics  . Smoking status: Never Smoker   . Smokeless tobacco: Never Used  . Alcohol Use: No   OB History    Gravida Para Term Preterm AB TAB SAB Ectopic Multiple Living   0 0 0 0 0 0 0 0 0 0      Review of Systems  Constitutional: Negative for appetite change and fatigue.  HENT: Negative for congestion, ear discharge and sinus pressure.   Eyes: Negative for discharge.  Respiratory: Negative for cough.   Cardiovascular: Negative for chest pain.  Gastrointestinal: Positive for abdominal pain. Negative for diarrhea.  Genitourinary: Negative for frequency and hematuria.  Musculoskeletal: Negative for back pain.  Skin: Negative for rash.  Neurological: Negative for seizures and headaches.   Psychiatric/Behavioral: Negative for hallucinations.      Allergies  Review of patient's allergies indicates no known allergies.  Home Medications   Prior to Admission medications   Medication Sig Start Date End Date Taking? Authorizing Provider  aspirin EC 81 MG tablet Take 81 mg by mouth daily.    Historical Provider, MD  azithromycin (ZITHROMAX Z-PAK) 250 MG tablet Take as directed. 10/30/14   Shelly Bombard, MD  guaiFENesin-codeine (GUAIFENESIN AC) 100-10 MG/5ML syrup Take 5 mLs by mouth 3 (three) times daily as needed for cough. 10/30/14   Shelly Bombard, MD  losartan-hydrochlorothiazide [facility]) 100-12.5 MG per tablet  10/08/14   Historical Provider, MD  metFORMIN (GLUCOPHAGE) 500 MG tablet Take 500 mg by mouth 2 (two) times daily with a meal.    Historical Provider, MD  polyethylene glycol (MIRALAX / GLYCOLAX) packet Take 17 g by mouth 2 (two) times daily. Until stooling regularly 07/03/13   Hilton Sinclair, MD  traMADol (ULTRAM) 50 MG tablet Take 1 tablet (50 mg total) by mouth every 6 (six) hours as needed. 12/19/14   Milton Ferguson, MD   BP 123/75 mmHg  Pulse 81  Temp(Src) 98.2 F (36.8 C) (Oral)  Resp 16  Ht 5\' 3"  (1.6 m)  Wt 191 lb (86.637 kg)  BMI 33.84 kg/m2  SpO2 100% Physical Exam  Constitutional: She is oriented to person, place, and time. She appears well-developed.  HENT:  Head: Normocephalic.  Eyes: Conjunctivae and EOM are normal.  No scleral icterus.  Neck: Neck supple. No thyromegaly present.  Cardiovascular: Normal rate and regular rhythm.  Exam reveals no gallop and no friction rub.   No murmur heard. Pulmonary/Chest: No stridor. She has no wheezes. She has no rales. She exhibits no tenderness.  Abdominal: She exhibits no distension. There is no tenderness. There is no rebound.  Musculoskeletal: Normal range of motion. She exhibits no edema.  Lymphadenopathy:    She has no cervical adenopathy.  Neurological: She is oriented to person, place, and  time. She exhibits normal muscle tone. Coordination normal.  Skin: No rash noted. No erythema.  Psychiatric: She has a normal mood and affect. Her behavior is normal.    ED Course  Procedures (including critical care time) Labs Review Labs Reviewed  COMPREHENSIVE METABOLIC PANEL - Abnormal; Notable for the following:    Sodium 134 (*)    Chloride 99 (*)    Glucose, Bld 129 (*)    BUN 29 (*)    Creatinine, Ser 1.25 (*)    GFR calc non Af Amer 44 (*)    GFR calc Af Amer 52 (*)    All other components within normal limits  CBC - Abnormal; Notable for the following:    RBC 3.86 (*)    Hemoglobin 11.8 (*)    HCT 35.6 (*)    All other components within normal limits  LIPASE, BLOOD  URINALYSIS, ROUTINE W REFLEX MICROSCOPIC (NOT AT [facility])    Imaging Review No results found. I have personally reviewed and evaluated these images and lab results as part of my medical decision-making.   EKG Interpretation None      MDM   Final diagnoses:  Abdominal pain in female       Milton Ferguson, MD 12/23/14 1553

## 2015-04-06 ENCOUNTER — Emergency Department (INDEPENDENT_AMBULATORY_CARE_PROVIDER_SITE_OTHER)
Admission: EM | Admit: 2015-04-06 | Discharge: 2015-04-06 | Disposition: A | Discharge status: Home / Self Care | Payer: 59 | Source: Home / Self Care | Attending: Family Medicine | Admitting: Family Medicine

## 2015-04-06 ENCOUNTER — Encounter (HOSPITAL_COMMUNITY): Payer: Self-pay | Admitting: Emergency Medicine

## 2015-04-06 ENCOUNTER — Emergency Department (INDEPENDENT_AMBULATORY_CARE_PROVIDER_SITE_OTHER): Payer: 59

## 2015-04-06 DIAGNOSIS — M545 Low back pain, unspecified: Secondary | ICD-10-CM

## 2015-04-06 MED ORDER — NAPROXEN 500 MG PO TABS: 500.0000 mg | ORAL_TABLET | Freq: Two times a day (BID) | ORAL | Status: DC

## 2015-04-06 MED ORDER — CYCLOBENZAPRINE HCL 10 MG PO TABS: 10.0000 mg | ORAL_TABLET | Freq: Two times a day (BID) | ORAL | Status: DC | PRN

## 2015-04-06 NOTE — ED Notes (Signed)
C/o back pain onset 3-4 months... Hx of pinched nerve Denies inj/trauma A&O x4... No acute distress.

## 2015-04-06 NOTE — ED Notes (Signed)
Pt d/c by Linde Gillis, PA

## 2015-04-06 NOTE — ED Provider Notes (Signed)
CSN: [id number]     Arrival date & time 04/06/15  1333 History   First MD Initiated Contact with Patient 04/06/15 1450     Chief Complaint  Patient presents with  . Back Pain   (Consider location/radiation/quality/duration/timing/severity/associated sxs/prior Treatment) HPI History obtained from patient:   LOCATION:bilateral lumbar SEVERITY:6 DURATION:over 1 week CONTEXT: unsure if pain onset while at home or at work QUALITY:like similar back pain MODIFYING FACTORS:rest, nsaids not helping ASSOCIATED SYMPTOMS:none TIMING:constant OCCUPATION:weaver   Past Medical History  Diagnosis Date  . Hypertension   . Diabetes mellitus without complication ([place])   . Fibroid uterus    Past Surgical History  Procedure Laterality Date  . I&d extremity Right 08/09/2013    Procedure: MINOR IRRIGATION AND DEBRIDEMENT EXTREMITY;  Surgeon: Cammie Sickle, MD;  Location: [place];  Service: Orthopedics;  Laterality: Right;  long       wound class 4   No family history on file. Social History  Substance Use Topics  . Smoking status: Never Smoker   . Smokeless tobacco: Never Used  . Alcohol Use: No   OB History    Gravida Para Term Preterm AB TAB SAB Ectopic Multiple Living   0 0 0 0 0 0 0 0 0 0      Review of Systems ROS +'ve back pain Denies: HEADACHE, NAUSEA, ABDOMINAL PAIN, CHEST PAIN, CONGESTION, DYSURIA, SHORTNESS OF BREATH  Allergies  Review of patient's allergies indicates no known allergies.  Home Medications   Prior to Admission medications   Medication Sig Start Date End Date Taking? Authorizing Provider  losartan-hydrochlorothiazide [facility]) 100-12.5 MG per tablet  10/08/14  Yes Historical Provider, MD  metFORMIN (GLUCOPHAGE) 500 MG tablet Take 500 mg by mouth 2 (two) times daily with a meal.   Yes Historical Provider, MD  aspirin EC 81 MG tablet Take 81 mg by mouth daily.    Historical Provider, MD  azithromycin (ZITHROMAX Z-PAK) 250 MG tablet Take as  directed. 10/30/14   Shelly Bombard, MD  cyclobenzaprine (FLEXERIL) 10 MG tablet Take 1 tablet (10 mg total) by mouth 2 (two) times daily as needed for muscle spasms. 04/06/15   Konrad Felix, PA  guaiFENesin-codeine (GUAIFENESIN AC) 100-10 MG/5ML syrup Take 5 mLs by mouth 3 (three) times daily as needed for cough. 10/30/14   Shelly Bombard, MD  naproxen (NAPROSYN) 500 MG tablet Take 1 tablet (500 mg total) by mouth 2 (two) times daily. 04/06/15   Konrad Felix, PA  polyethylene glycol [facility] / GLYCOLAX) packet Take 17 g by mouth 2 (two) times daily. Until stooling regularly 07/03/13   Hilton Sinclair, MD  traMADol (ULTRAM) 50 MG tablet Take 1 tablet (50 mg total) by mouth every 6 (six) hours as needed. 12/19/14   Milton Ferguson, MD   Meds Ordered and Administered this Visit  Medications - No data to display  BP 150/54 mmHg  Pulse 69  Temp(Src) 98.2 F (36.8 C) (Oral)  Resp 18  SpO2 97% No data found.   Physical Exam  Constitutional: She is oriented to person, place, and time. She appears well-developed and well-nourished.  HENT:  Head: Normocephalic and atraumatic.  Pulmonary/Chest: Effort normal.  Abdominal: Soft. Bowel sounds are normal.  Musculoskeletal: Normal range of motion. She exhibits tenderness.       Lumbar back: She exhibits pain and spasm. She exhibits no tenderness.       Back:  Neurological: She is alert and oriented to person, place,  and time.  Skin: Skin is warm and dry.  Nursing note and vitals reviewed.   ED Course  Procedures (including critical care time)  Labs Review Labs Reviewed - No data to display  Imaging Review Dg Lumbar Spine Complete  04/06/2015  CLINICAL DATA:  Chronic low back pain for 30 years without known injury. No radiculopathy. EXAM: LUMBAR SPINE - COMPLETE 4+ VIEW COMPARISON:  None. FINDINGS: No fracture or significant spondylolisthesis is noted. Severe degenerative disc disease is noted at L5-S1. Degenerative changes seen  involving posterior facet joints of L3-4 and L4-5 bilaterally. IMPRESSION: Severe degenerative disc disease is noted at L5-S1. No acute abnormality seen in the lumbar spine. Electronically Signed   By: Marijo Conception, M.D.   On: 04/06/2015 15:08     Visual Acuity Review  Right Eye Distance:   Left Eye Distance:   Bilateral Distance:    Right Eye Near:   Left Eye Near:    Bilateral Near:        Discussed results of x-rays with patient. MDM   1. Bilateral low back pain without sciatica    Patient is advised to continue home symptomatic treatment. Prescription for flexeril  sent pharmacy patient has indicated. Patient is advised that if there are new or worsening symptoms or attend the emergency department, or contact primary care provider. Instructions of care provided discharged home in stable condition. Return to work/school note provided.  THIS NOTE WAS GENERATED USING A VOICE RECOGNITION SOFTWARE PROGRAM. ALL REASONABLE EFFORTS  WERE MADE TO PROOFREAD THIS DOCUMENT FOR ACCURACY.     Konrad Felix, [place] 04/06/15 2000

## 2015-04-06 NOTE — Discharge Instructions (Signed)

## 2015-07-21 ENCOUNTER — Other Ambulatory Visit: Payer: Self-pay

## 2015-07-21 DIAGNOSIS — Z1231 Encounter for screening mammogram for malignant neoplasm of breast: Secondary | ICD-10-CM

## 2015-07-24 ENCOUNTER — Ambulatory Visit
Admission: RE | Admit: 2015-07-24 | Discharge: 2015-07-24 | Disposition: A | Discharge status: Home / Self Care | Payer: 59 | Source: Ambulatory Visit

## 2015-07-24 DIAGNOSIS — Z1231 Encounter for screening mammogram for malignant neoplasm of breast: Secondary | ICD-10-CM

## 2015-10-01 ENCOUNTER — Other Ambulatory Visit: Payer: Self-pay | Admitting: Internal Medicine

## 2015-10-01 DIAGNOSIS — E2839 Other primary ovarian failure: Secondary | ICD-10-CM

## 2015-10-01 DIAGNOSIS — R5381 Other malaise: Secondary | ICD-10-CM

## 2015-10-13 ENCOUNTER — Other Ambulatory Visit: Payer: 59

## 2015-10-17 ENCOUNTER — Ambulatory Visit
Admission: RE | Admit: 2015-10-17 | Discharge: 2015-10-17 | Disposition: A | Discharge status: Home / Self Care | Payer: 59 | Source: Ambulatory Visit | Attending: Internal Medicine | Admitting: Internal Medicine

## 2015-10-17 DIAGNOSIS — E2839 Other primary ovarian failure: Secondary | ICD-10-CM

## 2015-11-05 ENCOUNTER — Ambulatory Visit: Payer: 59 | Admitting: Obstetrics

## 2016-02-23 ENCOUNTER — Encounter (HOSPITAL_COMMUNITY): Payer: Self-pay

## 2016-02-23 ENCOUNTER — Emergency Department (HOSPITAL_COMMUNITY)
Admission: EM | Admit: 2016-02-23 | Discharge: 2016-02-23 | Disposition: A | Discharge status: Home / Self Care | Payer: 59 | Attending: Emergency Medicine | Admitting: Emergency Medicine

## 2016-02-23 DIAGNOSIS — R102 Pelvic and perineal pain: Secondary | ICD-10-CM

## 2016-02-23 DIAGNOSIS — Z7982 Long term (current) use of aspirin: Secondary | ICD-10-CM | POA: Insufficient documentation

## 2016-02-23 DIAGNOSIS — Z79899 Other long term (current) drug therapy: Secondary | ICD-10-CM | POA: Diagnosis not present

## 2016-02-23 DIAGNOSIS — I10 Essential (primary) hypertension: Secondary | ICD-10-CM | POA: Insufficient documentation

## 2016-02-23 DIAGNOSIS — N289 Disorder of kidney and ureter, unspecified: Secondary | ICD-10-CM

## 2016-02-23 DIAGNOSIS — E119 Type 2 diabetes mellitus without complications: Secondary | ICD-10-CM | POA: Diagnosis not present

## 2016-02-23 DIAGNOSIS — N3001 Acute cystitis with hematuria: Secondary | ICD-10-CM | POA: Diagnosis not present

## 2016-02-23 DIAGNOSIS — R350 Frequency of micturition: Secondary | ICD-10-CM | POA: Diagnosis not present

## 2016-02-23 DIAGNOSIS — E1165 Type 2 diabetes mellitus with hyperglycemia: Secondary | ICD-10-CM

## 2016-02-23 DIAGNOSIS — R1032 Left lower quadrant pain: Secondary | ICD-10-CM | POA: Diagnosis present

## 2016-02-23 LAB — CBC WITH DIFFERENTIAL/PLATELET
Basophils Absolute: 0 10*3/uL (ref 0.0–0.1)
Basophils Relative: 0 %
EOS ABS: 0.1 10*3/uL (ref 0.0–0.7)
Eosinophils Relative: 1 %
HEMATOCRIT: 37.6 % (ref 36.0–46.0)
HEMOGLOBIN: 12.8 g/dL (ref 12.0–15.0)
LYMPHS ABS: 2.2 10*3/uL (ref 0.7–4.0)
Lymphocytes Relative: 26 %
MCH: 31.1 pg (ref 26.0–34.0)
MCHC: 34 g/dL (ref 30.0–36.0)
MCV: 91.5 fL (ref 78.0–100.0)
MONOS PCT: 7 %
Monocytes Absolute: 0.6 10*3/uL (ref 0.1–1.0)
NEUTROS PCT: 66 %
Neutro Abs: 5.7 10*3/uL (ref 1.7–7.7)
Platelets: 256 10*3/uL (ref 150–400)
RBC: 4.11 MIL/uL (ref 3.87–5.11)
RDW: 14.4 % (ref 11.5–15.5)
WBC: 8.6 10*3/uL (ref 4.0–10.5)

## 2016-02-23 LAB — COMPREHENSIVE METABOLIC PANEL
ALK PHOS: 65 U/L (ref 38–126)
ALT: 28 U/L (ref 14–54)
ANION GAP: 8 (ref 5–15)
AST: 25 U/L (ref 15–41)
Albumin: 3.6 g/dL (ref 3.5–5.0)
BILIRUBIN TOTAL: 0.8 mg/dL (ref 0.3–1.2)
BUN: 22 mg/dL — ABNORMAL HIGH (ref 6–20)
CALCIUM: 9.6 mg/dL (ref 8.9–10.3)
CO2: 26 mmol/L (ref 22–32)
Chloride: 104 mmol/L (ref 101–111)
Creatinine, Ser: 1.04 mg/dL — ABNORMAL HIGH (ref 0.44–1.00)
GFR, EST NON AFRICAN AMERICAN: 55 mL/min — AB (ref >60)
GLUCOSE: 181 mg/dL — AB (ref 65–99)
Potassium: 3.8 mmol/L (ref 3.5–5.1)
Sodium: 138 mmol/L (ref 135–145)
TOTAL PROTEIN: 7.7 g/dL (ref 6.5–8.1)

## 2016-02-23 LAB — URINALYSIS, ROUTINE W REFLEX MICROSCOPIC
BILIRUBIN URINE: NEGATIVE
GLUCOSE, UA: NEGATIVE mg/dL
Ketones, ur: NEGATIVE mg/dL
LEUKOCYTES UA: NEGATIVE
NITRITE: NEGATIVE
PH: 6 (ref 5.0–8.0)
Protein, ur: NEGATIVE mg/dL
SPECIFIC GRAVITY, URINE: 1.017 (ref 1.005–1.030)

## 2016-02-23 LAB — LIPASE, BLOOD: Lipase: 16 U/L (ref 11–51)

## 2016-02-23 MED ORDER — IBUPROFEN 400 MG PO TABS
400.0000 mg | ORAL_TABLET | Freq: Once | ORAL | Status: AC
  Administered 2016-02-23: 400 mg via ORAL
  Filled 2016-02-23: qty 1

## 2016-02-23 MED ORDER — CEPHALEXIN 500 MG PO CAPS: ORAL_CAPSULE | ORAL | 0 refills | Status: DC

## 2016-02-23 NOTE — ED Provider Notes (Signed)
[facility] DEPT Provider Note   CSN: [id number] Arrival date & time: 02/23/16  1125     History   Chief Complaint Chief Complaint  Patient presents with  . Abdominal Pain    HPI Kristi Campos is a 65 y.o. female with a PMHx of DM2, HTN, and uterine fibroids, who presents to the ED with complaints of gradual onset lower abdominal pain 8 days. Patient describes the pain as 4/10 intermittent nonradiating aching and sharp lower abdominal pain, worse with holding her urine, and with no treatments tried prior to arrival and no known alleviating factors. Associated symptoms include increased urinary frequency and urgency. Nursing report stated that she had "loose stools" but patient denies this and states that she had a small soft formed stool approximately 1 hour prior to arrival. Denies any changes in her pain related to BMs. Reports that she had a UTI many years ago, and this feels somewhat similar to that.  She denies any fevers, chills, CP, SOB, N/V/D/C, obstipation, melena, hematochezia, hematuria, dysuria, vaginal bleeding/discharge, myalgias, arthralgias, numbness, tingling, weakness, or any other symptoms. Denies sick contacts, recent travel, suspicious food intake, EtOH/NSAID use, or prior abd surg.    The history is provided by the patient and medical records. No language interpreter was used.  Abdominal Pain   This is a new problem. The current episode started more than 1 week ago. The problem occurs daily. The problem has not changed since onset.The pain is associated with an unknown factor. The pain is located in the suprapubic region, LLQ and RLQ. The quality of the pain is sharp and aching. The pain is at a severity of 4/10. The pain is mild. Associated symptoms include frequency. Pertinent negatives include fever, diarrhea, flatus, hematochezia, melena, nausea, vomiting, constipation, dysuria, hematuria, arthralgias and myalgias. Exacerbated by: holding her urine. Nothing  relieves the symptoms.    Past Medical History:  Diagnosis Date  . Diabetes mellitus without complication ([place])   . Fibroid uterus   . Hypertension     There are no active problems to display for this patient.   Past Surgical History:  Procedure Laterality Date  . I&D EXTREMITY Right 08/09/2013   Procedure: MINOR IRRIGATION AND DEBRIDEMENT EXTREMITY;  Surgeon: Cammie Sickle, MD;  Location: [place];  Service: Orthopedics;  Laterality: Right;  long       wound class 4    OB History    Gravida Para Term Preterm AB Living   0 0 0 0 0 0   SAB TAB Ectopic Multiple Live Births   0 0 0 0         Home Medications    Prior to Admission medications   Medication Sig Start Date End Date Taking? Authorizing Provider  aspirin EC 81 MG tablet Take 81 mg by mouth daily.    Historical Provider, MD  azithromycin (ZITHROMAX Z-PAK) 250 MG tablet Take as directed. 10/30/14   Shelly Bombard, MD  cyclobenzaprine (FLEXERIL) 10 MG tablet Take 1 tablet (10 mg total) by mouth 2 (two) times daily as needed for muscle spasms. 04/06/15   Konrad Felix, PA  guaiFENesin-codeine (GUAIFENESIN AC) 100-10 MG/5ML syrup Take 5 mLs by mouth 3 (three) times daily as needed for cough. 10/30/14   Shelly Bombard, MD  losartan-hydrochlorothiazide [facility]) 100-12.5 MG per tablet  10/08/14   Historical Provider, MD  metFORMIN (GLUCOPHAGE) 500 MG tablet Take 500 mg by mouth 2 (two) times daily with a meal.  Historical Provider, MD  naproxen (NAPROSYN) 500 MG tablet Take 1 tablet (500 mg total) by mouth 2 (two) times daily. 04/06/15   Konrad Felix, PA  polyethylene glycol [facility] / GLYCOLAX) packet Take 17 g by mouth 2 (two) times daily. Until stooling regularly 07/03/13   Hilton Sinclair, MD  traMADol (ULTRAM) 50 MG tablet Take 1 tablet (50 mg total) by mouth every 6 (six) hours as needed. 12/19/14   Milton Ferguson, MD    Family History No family history on file.  Social History Social  History  Substance Use Topics  . Smoking status: Never Smoker  . Smokeless tobacco: Never Used  . Alcohol use No     Allergies   Patient has no known allergies.   Review of Systems Review of Systems  Constitutional: Negative for chills and fever.  Respiratory: Negative for shortness of breath.   Cardiovascular: Negative for chest pain.  Gastrointestinal: Positive for abdominal pain. Negative for blood in stool, constipation, diarrhea, flatus, hematochezia, melena, nausea and vomiting.  Genitourinary: Positive for frequency and urgency. Negative for dysuria, hematuria, vaginal bleeding and vaginal discharge.  Musculoskeletal: Negative for arthralgias and myalgias.  Skin: Negative for color change.  Allergic/Immunologic: Positive for immunocompromised state (diabetic).  Neurological: Negative for weakness and numbness.  Psychiatric/Behavioral: Negative for confusion.   10 Systems reviewed and are negative for acute change except as noted in the HPI.   Physical Exam Updated Vital Signs BP 156/95 (BP Location: Left Arm)   Pulse 116   Temp 99.1 F (37.3 C) (Oral)   Resp 18   Ht 5\' 3"  (1.6 m)   Wt 86.2 kg   SpO2 98%   BMI 33.66 kg/m   Physical Exam  Constitutional: She is oriented to person, place, and time. Vital signs are normal. She appears well-developed and well-nourished.  Non-toxic appearance. No distress.  Afebrile, nontoxic, NAD  HENT:  Head: Normocephalic and atraumatic.  Mouth/Throat: Oropharynx is clear and moist and mucous membranes are normal.  Eyes: Conjunctivae and EOM are normal. Right eye exhibits no discharge. Left eye exhibits no discharge.  Neck: Normal range of motion. Neck supple.  Cardiovascular: Normal rate, regular rhythm, normal heart sounds and intact distal pulses.  Exam reveals no gallop and no friction rub.   No murmur heard. HR 98-100 during exam  Pulmonary/Chest: Effort normal and breath sounds normal. No respiratory distress. She has no  decreased breath sounds. She has no wheezes. She has no rhonchi. She has no rales.  Abdominal: Soft. Normal appearance and bowel sounds are normal. She exhibits no distension. There is tenderness in the suprapubic area. There is no rigidity, no rebound, no guarding, no CVA tenderness, no tenderness at McBurney's point and negative Murphy's sign.  Soft, obese which limits exam slightly, but doesn't appear distended, +BS throughout, with very mild suprapubic TTP, no r/g/r, neg murphy's, neg mcburney's, no CVA TTP   Musculoskeletal: Normal range of motion.  Neurological: She is alert and oriented to person, place, and time. She has normal strength. No sensory deficit.  Skin: Skin is warm, dry and intact. No rash noted.  Psychiatric: She has a normal mood and affect.  Nursing note and vitals reviewed.    ED Treatments / Results  Labs (all labs ordered are listed, but only abnormal results are displayed) Labs Reviewed  COMPREHENSIVE METABOLIC PANEL - Abnormal; Notable for the following:       Result Value   Glucose, Bld 181 (*)    BUN 22 (*)  Creatinine, Ser 1.04 (*)    GFR calc non Af Amer 55 (*)    All other components within normal limits  URINALYSIS, ROUTINE W REFLEX MICROSCOPIC - Abnormal; Notable for the following:    Hgb urine dipstick SMALL (*)    Bacteria, UA RARE (*)    Squamous Epithelial / LPF 0-5 (*)    All other components within normal limits  URINE CULTURE  LIPASE, BLOOD  CBC WITH DIFFERENTIAL/PLATELET    EKG  EKG Interpretation None       Radiology No results found.  Procedures Procedures (including critical care time)  Medications Ordered in ED Medications  ibuprofen (ADVIL,MOTRIN) tablet 400 mg (400 mg Oral Given 02/23/16 1150)     Initial Impression / Assessment and Plan / ED Course  I have reviewed the triage vital signs and the nursing notes.  Pertinent labs & imaging results that were available during my care of the patient were reviewed by me  and considered in my medical decision making (see chart for details).  Clinical Course     65 y.o. female here with lower abd pain x8 days and increased urinary freq/urg. Nursing staff reported "loose stool" but pt reports that she had a small soft formed BM just prior to arrival and denies any diarrhea or loose stools to me. On exam, mild suprapubic TTP, nonperitoneal exam otherwise. Triage reporting HR 116 but during exam HR 98-100. Overall well appearing. Will proceed with labs and U/A, but hold off on imaging. Seems most consistent with UTI. Pt driving, so will opt for ibuprofen instead of narcotic meds. Will reassess shortly.   12:50 PM Pt feeling better. CBC w/diff unremarkable. CMP with mildly elevated BUN 22/Cr 1.04 near baseline, and gluc 181 but no anion gap or abnormal bicarb. Lipase WNL. U/A with small Hgb, 0-5 WBC and rare bacteria without significant amount of squamous cells, so likely represents fairly clean catch. Given symptoms c/w UTI, and U/A showing some evidence of such, will treat for UTI; will send for culture as well. Will start on keflex. Doubt other emergent pathology requiring further emergent work up. Discussed tylenol/motrin for pain, staying hydrated with water, and f/up with PCP in 3-5 days for recheck (pt actually has appt in 2 days). Strict return precautions discussed. I explained the diagnosis and have given explicit precautions to return to the ER including for any other new or worsening symptoms. The patient understands and accepts the medical plan as it's been dictated and I have answered their questions. Discharge instructions concerning home care and prescriptions have been given. The patient is STABLE and is discharged to home in good condition.   Final Clinical Impressions(s) / ED Diagnoses   Final diagnoses:  Suprapubic abdominal pain  Urinary frequency  Acute cystitis with hematuria  Renal insufficiency  Type 2 diabetes mellitus with hyperglycemia,  without long-term current use of insulin (HCC)    New Prescriptions New Prescriptions   CEPHALEXIN (KEFLEX) 500 MG CAPSULE    2 caps po bid x 5 days     Clea Dubach Camprubi-Soms, PA-C 02/23/16 1250    Julianne Rice, MD 02/25/16 1553

## 2016-02-23 NOTE — Discharge Instructions (Signed)
Stay very well hydrated with plenty of water throughout the day. Take antibiotic until completed. Use tylenol or motrin as needed for pain. Follow up with your primary care physician in 3-5 days for recheck of ongoing symptoms but return to ER for emergent changing or worsening of symptoms. Please seek immediate care if you develop the following: You develop back pain.  Your symptoms are no better, or worse in 3 days. There is severe back pain or lower abdominal pain.  You develop chills.  You have a fever.  There is nausea or vomiting.  There is continued burning or discomfort with urination.

## 2016-02-23 NOTE — ED Triage Notes (Signed)
Per Pt, Pt is coming from home with complaints of lower abdominal pain that started last Sunday. Pt reports "loose stools" and urinary frequency. But denies any nausea or vomiting.

## 2016-02-24 LAB — URINE CULTURE

## 2016-02-25 ENCOUNTER — Ambulatory Visit (INDEPENDENT_AMBULATORY_CARE_PROVIDER_SITE_OTHER): Payer: 59 | Admitting: Obstetrics

## 2016-02-25 ENCOUNTER — Encounter: Payer: Self-pay | Admitting: Obstetrics

## 2016-02-25 VITALS — BP 127/84 | HR 99 | Temp 98.0°F | Wt 192.8 lb

## 2016-02-25 DIAGNOSIS — Z01419 Encounter for gynecological examination (general) (routine) without abnormal findings: Secondary | ICD-10-CM

## 2016-02-25 DIAGNOSIS — Z124 Encounter for screening for malignant neoplasm of cervix: Secondary | ICD-10-CM

## 2016-02-25 NOTE — Addendum Note (Signed)
Addended by: Manuela Schwartz C on: 02/25/2016 02:42 PM   Modules accepted: Orders

## 2016-02-25 NOTE — Progress Notes (Signed)
Subjective:        Kristi Campos is a 65 y.o. female here for a routine exam.  Current complaints: Recent UTI, treated.    Personal health questionnaire:  Is patient Ashkenazi Jewish, have a family history of breast and/or ovarian cancer: no Is there a family history of uterine cancer diagnosed at age < 47, gastrointestinal cancer, urinary tract cancer, family member who is a Field seismologist syndrome-associated carrier: no Is the patient overweight and hypertensive, family history of diabetes, personal history of gestational diabetes, preeclampsia or PCOS: no Is patient over 76, have PCOS,  family history of premature CHD under age 41, diabetes, smoke, have hypertension or peripheral artery disease:  no At any time, has a partner hit, kicked or otherwise hurt or frightened you?: no Over the past 2 weeks, have you felt down, depressed or hopeless?: no Over the past 2 weeks, have you felt little interest or pleasure in doing things?:no   Gynecologic History No LMP recorded. Patient is postmenopausal. Contraception: post menopausal status Last Pap: 2016. Results were: normal Last mammogram: 2017. Results were: normal  Obstetric History OB History  Gravida Para Term Preterm AB Living  0 0 0 0 0 0  SAB TAB Ectopic Multiple Live Births  0 0 0 0          Past Medical History:  Diagnosis Date  . Diabetes mellitus without complication ([place])   . Fibroid uterus   . Hypertension     Past Surgical History:  Procedure Laterality Date  . I&D EXTREMITY Right 08/09/2013   Procedure: MINOR IRRIGATION AND DEBRIDEMENT EXTREMITY;  Surgeon: Cammie Sickle, MD;  Location: [place];  Service: Orthopedics;  Laterality: Right;  long       wound class 4     Current Outpatient Prescriptions:  .  aspirin EC 81 MG tablet, Take 81 mg by mouth daily., Disp: , Rfl:  .  azithromycin (ZITHROMAX Z-PAK) 250 MG tablet, Take as directed., Disp: 6 tablet, Rfl: 1 .  cephALEXin (KEFLEX) 500 MG  capsule, 2 caps po bid x 5 days, Disp: 20 capsule, Rfl: 0 .  cyclobenzaprine (FLEXERIL) 10 MG tablet, Take 1 tablet (10 mg total) by mouth 2 (two) times daily as needed for muscle spasms., Disp: 20 tablet, Rfl: 0 .  losartan-hydrochlorothiazide (HYZAAR) 100-12.5 MG per tablet, Take 1 tablet by mouth daily. , Disp: , Rfl:  .  metFORMIN (GLUCOPHAGE) 500 MG tablet, Take 500 mg by mouth 2 (two) times daily with a meal., Disp: , Rfl:  .  naproxen (NAPROSYN) 500 MG tablet, Take 1 tablet (500 mg total) by mouth 2 (two) times daily., Disp: 30 tablet, Rfl: 0 .  polyethylene glycol (MIRALAX / GLYCOLAX) packet, Take 17 g by mouth 2 (two) times daily. Until stooling regularly, Disp: 30 packet, Rfl: 0 .  guaiFENesin-codeine (GUAIFENESIN AC) 100-10 MG/5ML syrup, Take 5 mLs by mouth 3 (three) times daily as needed for cough. (Patient not taking: Reported on 02/25/2016), Disp: 236 mL, Rfl: 0 .  traMADol (ULTRAM) 50 MG tablet, Take 1 tablet (50 mg total) by mouth every 6 (six) hours as needed. (Patient not taking: Reported on 02/25/2016), Disp: 15 tablet, Rfl: 0 No Known Allergies  Social History  Substance Use Topics  . Smoking status: Never Smoker  . Smokeless tobacco: Never Used  . Alcohol use No    History reviewed. No pertinent family history.    Review of Systems  Constitutional: negative for fatigue and weight loss  Respiratory: negative for cough and wheezing Cardiovascular: negative for chest pain, fatigue and palpitations Gastrointestinal: negative for abdominal pain and change in bowel habits Musculoskeletal:negative for myalgias Neurological: negative for gait problems and tremors Behavioral/Psych: negative for abusive relationship, depression Endocrine: negative for temperature intolerance    Genitourinary:negative for abnormal menstrual periods, genital lesions, hot flashes, sexual problems and vaginal discharge Integument/breast: negative for breast lump, breast tenderness, nipple discharge  and skin lesion(s)    Objective:       BP 127/84   Pulse 99   Temp 98 F (36.7 C) (Oral)   Wt 192 lb 12.8 oz (87.5 kg)   BMI 34.15 kg/m  General:   alert  Skin:   no rash or abnormalities  Lungs:   clear to auscultation bilaterally  Heart:   regular rate and rhythm, S1, S2 normal, no murmur, click, rub or gallop  Breasts:   normal without suspicious masses, skin or nipple changes or axillary nodes  Abdomen:  normal findings: no organomegaly, soft, non-tender and no hernia  Pelvis:  External genitalia: normal general appearance Urinary system: urethral meatus normal and bladder without fullness, nontender Vaginal: normal without tenderness, induration or masses Cervix: normal appearance Adnexa: normal bimanual exam Uterus: anteverted and non-tender, normal size   Lab Review Urine pregnancy test Labs reviewed yes Radiologic studies reviewed yes  50% of 20 min visit spent on counseling and coordination of care.    Assessment:    Healthy female exam.    Plan:    Education reviewed: calcium supplements, low fat, low cholesterol diet, self breast exams and weight bearing exercise. Follow up in: 2 years.   No orders of the defined types were placed in this encounter.  No orders of the defined types were placed in this encounter.    Patient ID: Kristi Campos, female   DOB: 03-30-1950, 65 y.o.   MRN: [id number]

## 2016-02-26 LAB — CYTOLOGY - PAP
Diagnosis: UNDETERMINED — AB
HPV: NOT DETECTED

## 2016-02-27 LAB — NUSWAB BV AND CANDIDA, NAA
Candida albicans, NAA: NEGATIVE
Candida glabrata, NAA: NEGATIVE

## 2016-03-25 DIAGNOSIS — L97409 Non-pressure chronic ulcer of unspecified heel and midfoot with unspecified severity: Secondary | ICD-10-CM | POA: Diagnosis not present

## 2016-03-25 DIAGNOSIS — E1165 Type 2 diabetes mellitus with hyperglycemia: Secondary | ICD-10-CM | POA: Diagnosis not present

## 2016-03-25 DIAGNOSIS — I10 Essential (primary) hypertension: Secondary | ICD-10-CM | POA: Diagnosis not present

## 2016-03-25 DIAGNOSIS — E669 Obesity, unspecified: Secondary | ICD-10-CM | POA: Diagnosis not present

## 2016-04-19 DIAGNOSIS — M7731 Calcaneal spur, right foot: Secondary | ICD-10-CM | POA: Diagnosis not present

## 2016-04-19 DIAGNOSIS — M79672 Pain in left foot: Secondary | ICD-10-CM | POA: Diagnosis not present

## 2016-04-19 DIAGNOSIS — M7732 Calcaneal spur, left foot: Secondary | ICD-10-CM | POA: Diagnosis not present

## 2016-04-19 DIAGNOSIS — M79671 Pain in right foot: Secondary | ICD-10-CM | POA: Diagnosis not present

## 2016-04-19 DIAGNOSIS — M71571 Other bursitis, not elsewhere classified, right ankle and foot: Secondary | ICD-10-CM | POA: Diagnosis not present

## 2016-04-19 DIAGNOSIS — M722 Plantar fascial fibromatosis: Secondary | ICD-10-CM | POA: Diagnosis not present

## 2016-04-19 DIAGNOSIS — M71572 Other bursitis, not elsewhere classified, left ankle and foot: Secondary | ICD-10-CM | POA: Diagnosis not present

## 2016-05-03 DIAGNOSIS — M71572 Other bursitis, not elsewhere classified, left ankle and foot: Secondary | ICD-10-CM | POA: Diagnosis not present

## 2016-05-03 DIAGNOSIS — M71571 Other bursitis, not elsewhere classified, right ankle and foot: Secondary | ICD-10-CM | POA: Diagnosis not present

## 2016-05-03 DIAGNOSIS — M722 Plantar fascial fibromatosis: Secondary | ICD-10-CM | POA: Diagnosis not present

## 2016-05-06 DIAGNOSIS — K219 Gastro-esophageal reflux disease without esophagitis: Secondary | ICD-10-CM | POA: Diagnosis not present

## 2016-05-06 DIAGNOSIS — E1165 Type 2 diabetes mellitus with hyperglycemia: Secondary | ICD-10-CM | POA: Diagnosis not present

## 2016-05-06 DIAGNOSIS — I10 Essential (primary) hypertension: Secondary | ICD-10-CM | POA: Diagnosis not present

## 2016-05-06 DIAGNOSIS — M7061 Trochanteric bursitis, right hip: Secondary | ICD-10-CM | POA: Diagnosis not present

## 2016-05-24 DIAGNOSIS — M71571 Other bursitis, not elsewhere classified, right ankle and foot: Secondary | ICD-10-CM | POA: Diagnosis not present

## 2016-05-24 DIAGNOSIS — M71572 Other bursitis, not elsewhere classified, left ankle and foot: Secondary | ICD-10-CM | POA: Diagnosis not present

## 2016-05-24 DIAGNOSIS — M722 Plantar fascial fibromatosis: Secondary | ICD-10-CM | POA: Diagnosis not present

## 2016-06-14 DIAGNOSIS — M71571 Other bursitis, not elsewhere classified, right ankle and foot: Secondary | ICD-10-CM | POA: Diagnosis not present

## 2016-06-14 DIAGNOSIS — M722 Plantar fascial fibromatosis: Secondary | ICD-10-CM | POA: Diagnosis not present

## 2016-06-14 DIAGNOSIS — M71572 Other bursitis, not elsewhere classified, left ankle and foot: Secondary | ICD-10-CM | POA: Diagnosis not present

## 2016-06-17 DIAGNOSIS — K219 Gastro-esophageal reflux disease without esophagitis: Secondary | ICD-10-CM | POA: Diagnosis not present

## 2016-06-17 DIAGNOSIS — I10 Essential (primary) hypertension: Secondary | ICD-10-CM | POA: Diagnosis not present

## 2016-06-17 DIAGNOSIS — E669 Obesity, unspecified: Secondary | ICD-10-CM | POA: Diagnosis not present

## 2016-06-17 DIAGNOSIS — E1165 Type 2 diabetes mellitus with hyperglycemia: Secondary | ICD-10-CM | POA: Diagnosis not present

## 2016-07-05 DIAGNOSIS — M722 Plantar fascial fibromatosis: Secondary | ICD-10-CM | POA: Diagnosis not present

## 2016-07-05 DIAGNOSIS — M79671 Pain in right foot: Secondary | ICD-10-CM | POA: Diagnosis not present

## 2016-07-05 DIAGNOSIS — M79672 Pain in left foot: Secondary | ICD-10-CM | POA: Diagnosis not present

## 2016-10-28 DIAGNOSIS — M722 Plantar fascial fibromatosis: Secondary | ICD-10-CM | POA: Diagnosis not present

## 2016-10-28 DIAGNOSIS — M65871 Other synovitis and tenosynovitis, right ankle and foot: Secondary | ICD-10-CM | POA: Diagnosis not present

## 2016-10-28 DIAGNOSIS — M65872 Other synovitis and tenosynovitis, left ankle and foot: Secondary | ICD-10-CM | POA: Diagnosis not present

## 2016-12-23 DIAGNOSIS — I10 Essential (primary) hypertension: Secondary | ICD-10-CM | POA: Diagnosis not present

## 2016-12-23 DIAGNOSIS — Z23 Encounter for immunization: Secondary | ICD-10-CM | POA: Diagnosis not present

## 2016-12-23 DIAGNOSIS — M5136 Other intervertebral disc degeneration, lumbar region: Secondary | ICD-10-CM | POA: Diagnosis not present

## 2016-12-23 DIAGNOSIS — E1165 Type 2 diabetes mellitus with hyperglycemia: Secondary | ICD-10-CM | POA: Diagnosis not present

## 2017-01-05 DIAGNOSIS — M71572 Other bursitis, not elsewhere classified, left ankle and foot: Secondary | ICD-10-CM | POA: Diagnosis not present

## 2017-01-05 DIAGNOSIS — M722 Plantar fascial fibromatosis: Secondary | ICD-10-CM | POA: Diagnosis not present

## 2017-01-05 DIAGNOSIS — M71571 Other bursitis, not elsewhere classified, right ankle and foot: Secondary | ICD-10-CM | POA: Diagnosis not present

## 2017-01-22 ENCOUNTER — Emergency Department (HOSPITAL_COMMUNITY): Payer: BLUE CROSS/BLUE SHIELD

## 2017-01-22 ENCOUNTER — Emergency Department (HOSPITAL_COMMUNITY)
Admission: EM | Admit: 2017-01-22 | Discharge: 2017-01-22 | Disposition: A | Discharge status: Home / Self Care | Payer: BLUE CROSS/BLUE SHIELD | Attending: Emergency Medicine | Admitting: Emergency Medicine

## 2017-01-22 ENCOUNTER — Other Ambulatory Visit: Payer: Self-pay

## 2017-01-22 ENCOUNTER — Encounter (HOSPITAL_COMMUNITY): Payer: Self-pay | Admitting: Emergency Medicine

## 2017-01-22 DIAGNOSIS — Y93E1 Activity, personal bathing and showering: Secondary | ICD-10-CM | POA: Diagnosis not present

## 2017-01-22 DIAGNOSIS — S0003XA Contusion of scalp, initial encounter: Secondary | ICD-10-CM | POA: Insufficient documentation

## 2017-01-22 DIAGNOSIS — Y999 Unspecified external cause status: Secondary | ICD-10-CM | POA: Diagnosis not present

## 2017-01-22 DIAGNOSIS — Y929 Unspecified place or not applicable: Secondary | ICD-10-CM | POA: Diagnosis not present

## 2017-01-22 DIAGNOSIS — E119 Type 2 diabetes mellitus without complications: Secondary | ICD-10-CM | POA: Diagnosis not present

## 2017-01-22 DIAGNOSIS — S0990XA Unspecified injury of head, initial encounter: Secondary | ICD-10-CM

## 2017-01-22 DIAGNOSIS — S064X0A Epidural hemorrhage without loss of consciousness, initial encounter: Secondary | ICD-10-CM | POA: Diagnosis not present

## 2017-01-22 DIAGNOSIS — I10 Essential (primary) hypertension: Secondary | ICD-10-CM | POA: Insufficient documentation

## 2017-01-22 DIAGNOSIS — W010XXA Fall on same level from slipping, tripping and stumbling without subsequent striking against object, initial encounter: Secondary | ICD-10-CM | POA: Insufficient documentation

## 2017-01-22 DIAGNOSIS — Z79899 Other long term (current) drug therapy: Secondary | ICD-10-CM | POA: Insufficient documentation

## 2017-01-22 MED ORDER — ACETAMINOPHEN 325 MG PO TABS
650.0000 mg | ORAL_TABLET | Freq: Once | ORAL | Status: AC
  Administered 2017-01-22: 650 mg via ORAL
  Filled 2017-01-22: qty 2

## 2017-01-22 NOTE — ED Notes (Signed)
Patient transported to CT 

## 2017-01-22 NOTE — ED Triage Notes (Signed)
Received pt from home with c/o fell out of shower backwards hitting head on bathroom sink. Pt denies + LOC.

## 2017-01-22 NOTE — Discharge Instructions (Signed)
Please read attached information. If you experience any new or worsening signs or symptoms please return to the emergency room for evaluation. Please follow-up with your primary care provider or specialist as discussed.  °

## 2017-01-22 NOTE — ED Provider Notes (Signed)
[facility] EMERGENCY DEPARTMENT Provider Note   CSN: [id number] Arrival date & time: 01/22/17  1032   History   Chief Complaint Chief Complaint  Patient presents with  . Fall  . Head Injury    HPI Kristi Campos is a 66 y.o. female.  HPI   66 year old female presents status post fall.  Patient reports she slipped in the shower fell back and hit the posterior aspect her head.  She denies any other injuries, denies any neck pain, back pain, that she can ambulate without significant difficulty.  Patient reports posterior head pain, no bleeding, denies any loss of consciousness, denies any neurological deficits.  Patient reports she is not on blood thinners.   Past Medical History:  Diagnosis Date  . Diabetes mellitus without complication ([place])   . Fibroid uterus   . Hypertension     There are no active problems to display for this patient.   Past Surgical History:  Procedure Laterality Date  . I&D EXTREMITY Right 08/09/2013   Procedure: MINOR IRRIGATION AND DEBRIDEMENT EXTREMITY;  Surgeon: Cammie Sickle, MD;  Location: [facility];  Service: Orthopedics;  Laterality: Right;  long       wound class 4    OB History    Gravida Para Term Preterm AB Living   0 0 0 0 0 0   SAB TAB Ectopic Multiple Live Births   0 0 0 0         Home Medications    Prior to Admission medications   Medication Sig Start Date End Date Taking? Authorizing Provider  aspirin EC 81 MG tablet Take 81 mg by mouth daily.   Yes [provider]  losartan-hydrochlorothiazide (HYZAAR) 100-12.5 MG per tablet Take 1 tablet by mouth daily.  10/08/14  Yes [provider]  metFORMIN (GLUCOPHAGE) 500 MG tablet Take 500 mg by mouth 2 (two) times daily with a meal.   Yes [provider]  naproxen (NAPROSYN) 500 MG tablet Take 1 tablet (500 mg total) by mouth 2 (two) times daily. 04/06/15  Yes Konrad Felix, PA  polyethylene glycol (MIRALAX /  GLYCOLAX) packet Take 17 g by mouth 2 (two) times daily. Until stooling regularly 07/03/13  Yes Hilton Sinclair, MD  azithromycin (ZITHROMAX Z-PAK) 250 MG tablet Take as directed. Patient not taking: Reported on 01/22/2017 10/30/14   Shelly Bombard, MD  cephALEXin [facility]) 500 MG capsule 2 caps po bid x 5 days Patient not taking: Reported on 01/22/2017 02/23/16   Street, [place], PA-C  cyclobenzaprine (FLEXERIL) 10 MG tablet Take 1 tablet (10 mg total) by mouth 2 (two) times daily as needed for muscle spasms. Patient not taking: Reported on 01/22/2017 04/06/15   Konrad Felix, PA  guaiFENesin-codeine (GUAIFENESIN AC) 100-10 MG/5ML syrup Take 5 mLs by mouth 3 (three) times daily as needed for cough. Patient not taking: Reported on 02/25/2016 10/30/14   Shelly Bombard, MD    Family History No family history on file.  Social History Social History   Tobacco Use  . Smoking status: Never Smoker  . Smokeless tobacco: Never Used  Substance Use Topics  . Alcohol use: No    Alcohol/week: 0.0 oz  . Drug use: No     Allergies   Patient has no known allergies.   Review of Systems Review of Systems  All other systems reviewed and are negative.    Physical Exam Updated Vital Signs BP (!) 161/76  Pulse 60   Temp 98.6 F (37 C) (Oral)   Resp 16   Ht 5\' 3"  (1.6 m)   Wt 86.6 kg (191 lb)   SpO2 100%   BMI 33.83 kg/m   Physical Exam  Constitutional: She is oriented to person, place, and time. She appears well-developed and well-nourished.  HENT:  Head: Normocephalic.  Hematoma to occipital region  Eyes: Conjunctivae are normal. Pupils are equal, round, and reactive to light. Right eye exhibits no discharge. Left eye exhibits no discharge. No scleral icterus.  Neck: Normal range of motion. No JVD present. No tracheal deviation present.  Pulmonary/Chest: Effort normal. No stridor.  Musculoskeletal:  No CT or L-spine tenderness palpation full active range of motion of  the neck, bilateral upper and lower extremities atraumatic full active pain-free range of motion  Neurological: She is alert and oriented to person, place, and time. No cranial nerve deficit. Coordination normal.  Psychiatric: She has a normal mood and affect. Her behavior is normal. Judgment and thought content normal.  Nursing note and vitals reviewed.    ED Treatments / Results  Labs (all labs ordered are listed, but only abnormal results are displayed) Labs Reviewed - No data to display  EKG  EKG Interpretation None       Radiology Ct Head Wo Contrast  Result Date: 01/22/2017 CLINICAL DATA:  Posttraumatic headache after fall in shower today. EXAM: CT HEAD WITHOUT CONTRAST TECHNIQUE: Contiguous axial images were obtained from the base of the skull through the vertex without intravenous contrast. COMPARISON:  None. FINDINGS: Brain: No evidence of acute infarction, hemorrhage, hydrocephalus, extra-axial collection or mass lesion/mass effect. Vascular: No hyperdense vessel or unexpected calcification. Skull: Normal. Negative for fracture or focal lesion. Sinuses/Orbits: No acute finding. Other: None. IMPRESSION: Normal head CT. Electronically Signed   By: Marijo Conception, M.D.   On: 01/22/2017 11:56    Procedures Procedures (including critical care time)  Medications Ordered in ED Medications  acetaminophen (TYLENOL) tablet 650 mg (650 mg Oral Given 01/22/17 1239)     Initial Impression / Assessment and Plan / ED Course  I have reviewed the triage vital signs and the nursing notes.  Pertinent labs & imaging results that were available during my care of the patient were reviewed by me and considered in my medical decision making (see chart for details).      Final Clinical Impressions(s) / ED Diagnoses   Final diagnoses:  Injury of head, initial encounter   Labs:   Imaging: CT head without  Consults:  Therapeutics:  Discharge Meds:   Assessment/Plan:  66 year old female presents status post fall.  She has a small hematoma on the posterior aspect of her head.  She has no neurological deficits, no neck pain, no other injuries from the fall.  I discussed the options of imaging versus not imaging.  I have low suspicion for acute intracranial abnormality, but patient is concerned that she hit her head with significant force and would like imaging.  CT head will be ordered, if no significant findings noted patient will be discharged with strict return precautions and follow-up information.  Patient verbalized understanding and agreement to today's plan had no further questions or concerns at time of discharge.      ED Discharge Orders    None       Francee Gentile 01/22/17 1628    Virgel Manifold, MD 01/23/17 1139

## 2017-01-24 DIAGNOSIS — M71572 Other bursitis, not elsewhere classified, left ankle and foot: Secondary | ICD-10-CM | POA: Diagnosis not present

## 2017-01-24 DIAGNOSIS — M722 Plantar fascial fibromatosis: Secondary | ICD-10-CM | POA: Diagnosis not present

## 2017-01-31 DIAGNOSIS — Z79899 Other long term (current) drug therapy: Secondary | ICD-10-CM | POA: Diagnosis not present

## 2017-01-31 DIAGNOSIS — M6702 Short Achilles tendon (acquired), left ankle: Secondary | ICD-10-CM | POA: Diagnosis not present

## 2017-01-31 DIAGNOSIS — E119 Type 2 diabetes mellitus without complications: Secondary | ICD-10-CM | POA: Diagnosis not present

## 2017-01-31 DIAGNOSIS — Z01812 Encounter for preprocedural laboratory examination: Secondary | ICD-10-CM | POA: Diagnosis not present

## 2017-01-31 DIAGNOSIS — M722 Plantar fascial fibromatosis: Secondary | ICD-10-CM | POA: Diagnosis not present

## 2017-02-15 DIAGNOSIS — M6702 Short Achilles tendon (acquired), left ankle: Secondary | ICD-10-CM | POA: Diagnosis not present

## 2017-02-15 DIAGNOSIS — M722 Plantar fascial fibromatosis: Secondary | ICD-10-CM | POA: Diagnosis not present

## 2017-02-18 DIAGNOSIS — M722 Plantar fascial fibromatosis: Secondary | ICD-10-CM | POA: Diagnosis not present

## 2017-03-04 DIAGNOSIS — M722 Plantar fascial fibromatosis: Secondary | ICD-10-CM | POA: Diagnosis not present

## 2017-03-18 DIAGNOSIS — M722 Plantar fascial fibromatosis: Secondary | ICD-10-CM | POA: Diagnosis not present

## 2017-04-04 ENCOUNTER — Other Ambulatory Visit: Payer: Self-pay | Admitting: Obstetrics

## 2017-04-04 DIAGNOSIS — Z139 Encounter for screening, unspecified: Secondary | ICD-10-CM

## 2017-04-15 DIAGNOSIS — M722 Plantar fascial fibromatosis: Secondary | ICD-10-CM | POA: Diagnosis not present

## 2017-04-18 ENCOUNTER — Other Ambulatory Visit: Payer: Self-pay

## 2017-04-18 ENCOUNTER — Other Ambulatory Visit (HOSPITAL_COMMUNITY)
Admission: RE | Admit: 2017-04-18 | Discharge: 2017-04-18 | Disposition: A | Discharge status: Home / Self Care | Payer: BLUE CROSS/BLUE SHIELD | Source: Ambulatory Visit | Attending: Obstetrics | Admitting: Obstetrics

## 2017-04-18 ENCOUNTER — Encounter: Payer: Self-pay | Admitting: Obstetrics

## 2017-04-18 ENCOUNTER — Ambulatory Visit (INDEPENDENT_AMBULATORY_CARE_PROVIDER_SITE_OTHER): Payer: BLUE CROSS/BLUE SHIELD | Admitting: Obstetrics

## 2017-04-18 VITALS — BP 120/76 | HR 75 | Ht 63.0 in | Wt 189.8 lb

## 2017-04-18 DIAGNOSIS — Z780 Asymptomatic menopausal state: Secondary | ICD-10-CM | POA: Diagnosis not present

## 2017-04-18 DIAGNOSIS — Z01419 Encounter for gynecological examination (general) (routine) without abnormal findings: Secondary | ICD-10-CM | POA: Diagnosis not present

## 2017-04-18 DIAGNOSIS — N898 Other specified noninflammatory disorders of vagina: Secondary | ICD-10-CM

## 2017-04-18 NOTE — Progress Notes (Signed)
Subjective:        Kristi Campos is a 67 y.o. female here for a routine exam.  Current complaints: None.    Personal health questionnaire:  Is patient Ashkenazi Jewish, have a family history of breast and/or ovarian cancer: no Is there a family history of uterine cancer diagnosed at age < 48, gastrointestinal cancer, urinary tract cancer, family member who is a Field seismologist syndrome-associated carrier: no Is the patient overweight and hypertensive, family history of diabetes, personal history of gestational diabetes, preeclampsia or PCOS: no Is patient over 56, have PCOS,  family history of premature CHD under age 16, diabetes, smoke, have hypertension or peripheral artery disease:  no At any time, has a partner hit, kicked or otherwise hurt or frightened you?: no Over the past 2 weeks, have you felt down, depressed or hopeless?: no Over the past 2 weeks, have you felt little interest or pleasure in doing things?:no   Gynecologic History No LMP recorded. Patient is postmenopausal. Contraception: post menopausal status Last Pap: 2017. Results were: normal Last mammogram: 2017. Results were: normal  Obstetric History OB History  Gravida Para Term Preterm AB Living  0 0 0 0 0 0  SAB TAB Ectopic Multiple Live Births  0 0 0 0          Past Medical History:  Diagnosis Date  . Diabetes mellitus without complication ([place])   . Fibroid uterus   . Hypertension     Past Surgical History:  Procedure Laterality Date  . I&D EXTREMITY Right 08/09/2013   Procedure: MINOR IRRIGATION AND DEBRIDEMENT EXTREMITY;  Surgeon: Cammie Sickle, MD;  Location: [facility];  Service: Orthopedics;  Laterality: Right;  long       wound class 4     Current Outpatient Medications:  .  aspirin EC 81 MG tablet, Take 81 mg by mouth daily., Disp: , Rfl:  .  azithromycin (ZITHROMAX Z-PAK) 250 MG tablet, Take as directed. (Patient not taking: Reported on 01/22/2017), Disp: 6 tablet, Rfl: 1 .   cephALEXin (KEFLEX) 500 MG capsule, 2 caps po bid x 5 days (Patient not taking: Reported on 01/22/2017), Disp: 20 capsule, Rfl: 0 .  cyclobenzaprine (FLEXERIL) 10 MG tablet, Take 1 tablet (10 mg total) by mouth 2 (two) times daily as needed for muscle spasms. (Patient not taking: Reported on 01/22/2017), Disp: 20 tablet, Rfl: 0 .  guaiFENesin-codeine (GUAIFENESIN AC) 100-10 MG/5ML syrup, Take 5 mLs by mouth 3 (three) times daily as needed for cough. (Patient not taking: Reported on 02/25/2016), Disp: 236 mL, Rfl: 0 .  losartan-hydrochlorothiazide (HYZAAR) 100-12.5 MG per tablet, Take 1 tablet by mouth daily. , Disp: , Rfl:  .  metFORMIN (GLUCOPHAGE) 500 MG tablet, Take 500 mg by mouth 2 (two) times daily with a meal., Disp: , Rfl:  .  naproxen (NAPROSYN) 500 MG tablet, Take 1 tablet (500 mg total) by mouth 2 (two) times daily. (Patient not taking: Reported on 04/18/2017), Disp: 30 tablet, Rfl: 0 .  polyethylene glycol (MIRALAX / GLYCOLAX) packet, Take 17 g by mouth 2 (two) times daily. Until stooling regularly, Disp: 30 packet, Rfl: 0 No Known Allergies  Social History   Tobacco Use  . Smoking status: Never Smoker  . Smokeless tobacco: Never Used  Substance Use Topics  . Alcohol use: No    Alcohol/week: 0.0 oz    History reviewed. No pertinent family history.    Review of Systems  Constitutional: negative for fatigue and weight loss  Respiratory: negative for cough and wheezing Cardiovascular: negative for chest pain, fatigue and palpitations Gastrointestinal: negative for abdominal pain and change in bowel habits Musculoskeletal:negative for myalgias Neurological: negative for gait problems and tremors Behavioral/Psych: negative for abusive relationship, depression Endocrine: negative for temperature intolerance    Genitourinary:negative for abnormal menstrual periods, genital lesions, hot flashes, sexual problems and vaginal discharge Integument/breast: negative for breast lump, breast  tenderness, nipple discharge and skin lesion(s)    Objective:       BP 120/76   Pulse 75   Ht 5\' 3"  (1.6 m)   Wt 189 lb 12.8 oz (86.1 kg)   BMI 33.62 kg/m  General:   alert  Skin:   no rash or abnormalities  Lungs:   clear to auscultation bilaterally  Heart:   regular rate and rhythm, S1, S2 normal, no murmur, click, rub or gallop  Breasts:   normal without suspicious masses, skin or nipple changes or axillary nodes  Abdomen:  normal findings: no organomegaly, soft, non-tender and no hernia  Pelvis:  External genitalia: normal general appearance Urinary system: urethral meatus normal and bladder without fullness, nontender Vaginal: normal without tenderness, induration or masses Cervix: normal appearance Adnexa: normal bimanual exam Uterus: anteverted and non-tender, normal size   Lab Review Urine pregnancy test Labs reviewed yes Radiologic studies reviewed yes  50% of 20 min visit spent on counseling and coordination of care.   Assessment:     1. Encounter for routine gynecological examination with Papanicolaou smear of cervix Rx: - Cytology - PAP  2. Postmenopausal - doing well  3. Vaginal discharge Rx - Cervicovaginal ancillary only    Plan:    Education reviewed: calcium supplements, depression evaluation, low fat, low cholesterol diet, safe sex/STD prevention, self breast exams and weight bearing exercise. Follow up in: 2 years.   No orders of the defined types were placed in this encounter.  No orders of the defined types were placed in this encounter.   Shelly Bombard MD

## 2017-04-18 NOTE — Progress Notes (Signed)
Presents for AEX/PAP/GC

## 2017-04-19 LAB — CYTOLOGY - PAP
DIAGNOSIS: NEGATIVE
HPV: NOT DETECTED

## 2017-04-19 LAB — CERVICOVAGINAL ANCILLARY ONLY
BACTERIAL VAGINITIS: NEGATIVE
Candida vaginitis: NEGATIVE
TRICH (WINDOWPATH): NEGATIVE

## 2017-04-22 ENCOUNTER — Ambulatory Visit
Admission: RE | Admit: 2017-04-22 | Discharge: 2017-04-22 | Disposition: A | Discharge status: Home / Self Care | Payer: BLUE CROSS/BLUE SHIELD | Source: Ambulatory Visit | Attending: Obstetrics | Admitting: Obstetrics

## 2017-04-22 DIAGNOSIS — Z1231 Encounter for screening mammogram for malignant neoplasm of breast: Secondary | ICD-10-CM | POA: Diagnosis not present

## 2017-04-22 DIAGNOSIS — Z139 Encounter for screening, unspecified: Secondary | ICD-10-CM

## 2017-05-31 DIAGNOSIS — I10 Essential (primary) hypertension: Secondary | ICD-10-CM | POA: Diagnosis not present

## 2017-05-31 DIAGNOSIS — Z0000 Encounter for general adult medical examination without abnormal findings: Secondary | ICD-10-CM | POA: Diagnosis not present

## 2017-05-31 DIAGNOSIS — E1165 Type 2 diabetes mellitus with hyperglycemia: Secondary | ICD-10-CM | POA: Diagnosis not present

## 2017-05-31 DIAGNOSIS — M5136 Other intervertebral disc degeneration, lumbar region: Secondary | ICD-10-CM | POA: Diagnosis not present

## 2017-06-15 DIAGNOSIS — E1165 Type 2 diabetes mellitus with hyperglycemia: Secondary | ICD-10-CM | POA: Diagnosis not present

## 2017-06-15 DIAGNOSIS — I10 Essential (primary) hypertension: Secondary | ICD-10-CM | POA: Diagnosis not present

## 2017-06-15 DIAGNOSIS — M5136 Other intervertebral disc degeneration, lumbar region: Secondary | ICD-10-CM | POA: Diagnosis not present

## 2017-06-15 DIAGNOSIS — Z0000 Encounter for general adult medical examination without abnormal findings: Secondary | ICD-10-CM | POA: Diagnosis not present

## 2017-08-10 DIAGNOSIS — M71572 Other bursitis, not elsewhere classified, left ankle and foot: Secondary | ICD-10-CM | POA: Diagnosis not present

## 2017-08-10 DIAGNOSIS — M722 Plantar fascial fibromatosis: Secondary | ICD-10-CM | POA: Diagnosis not present

## 2017-08-10 DIAGNOSIS — M7732 Calcaneal spur, left foot: Secondary | ICD-10-CM | POA: Diagnosis not present

## 2017-08-23 DIAGNOSIS — I10 Essential (primary) hypertension: Secondary | ICD-10-CM | POA: Diagnosis not present

## 2017-08-23 DIAGNOSIS — E1165 Type 2 diabetes mellitus with hyperglycemia: Secondary | ICD-10-CM | POA: Diagnosis not present

## 2017-08-23 DIAGNOSIS — M5136 Other intervertebral disc degeneration, lumbar region: Secondary | ICD-10-CM | POA: Diagnosis not present

## 2017-08-23 DIAGNOSIS — E669 Obesity, unspecified: Secondary | ICD-10-CM | POA: Diagnosis not present

## 2017-08-24 DIAGNOSIS — M722 Plantar fascial fibromatosis: Secondary | ICD-10-CM | POA: Diagnosis not present

## 2017-08-24 DIAGNOSIS — M71572 Other bursitis, not elsewhere classified, left ankle and foot: Secondary | ICD-10-CM | POA: Diagnosis not present

## 2017-09-19 ENCOUNTER — Encounter (HOSPITAL_COMMUNITY): Payer: Self-pay | Admitting: Emergency Medicine

## 2017-09-19 ENCOUNTER — Emergency Department (HOSPITAL_COMMUNITY)
Admission: EM | Admit: 2017-09-19 | Discharge: 2017-09-20 | Disposition: A | Discharge status: Home / Self Care | Payer: BLUE CROSS/BLUE SHIELD | Attending: Emergency Medicine | Admitting: Emergency Medicine

## 2017-09-19 DIAGNOSIS — R112 Nausea with vomiting, unspecified: Secondary | ICD-10-CM | POA: Insufficient documentation

## 2017-09-19 DIAGNOSIS — Z7984 Long term (current) use of oral hypoglycemic drugs: Secondary | ICD-10-CM | POA: Diagnosis not present

## 2017-09-19 DIAGNOSIS — Z7982 Long term (current) use of aspirin: Secondary | ICD-10-CM | POA: Diagnosis not present

## 2017-09-19 DIAGNOSIS — I10 Essential (primary) hypertension: Secondary | ICD-10-CM | POA: Diagnosis not present

## 2017-09-19 DIAGNOSIS — E119 Type 2 diabetes mellitus without complications: Secondary | ICD-10-CM | POA: Diagnosis not present

## 2017-09-19 DIAGNOSIS — K5900 Constipation, unspecified: Secondary | ICD-10-CM | POA: Diagnosis not present

## 2017-09-19 DIAGNOSIS — Z79899 Other long term (current) drug therapy: Secondary | ICD-10-CM | POA: Insufficient documentation

## 2017-09-19 DIAGNOSIS — N3000 Acute cystitis without hematuria: Secondary | ICD-10-CM

## 2017-09-19 DIAGNOSIS — R1084 Generalized abdominal pain: Secondary | ICD-10-CM | POA: Diagnosis not present

## 2017-09-19 LAB — LIPASE, BLOOD: Lipase: 32 U/L (ref 11–51)

## 2017-09-19 LAB — URINALYSIS, ROUTINE W REFLEX MICROSCOPIC
Bilirubin Urine: NEGATIVE
Glucose, UA: NEGATIVE mg/dL
Hgb urine dipstick: NEGATIVE
Ketones, ur: NEGATIVE mg/dL
Nitrite: NEGATIVE
PROTEIN: 30 mg/dL — AB
SPECIFIC GRAVITY, URINE: 1.021 (ref 1.005–1.030)
pH: 5 (ref 5.0–8.0)

## 2017-09-19 LAB — CBC
HCT: 38.9 % (ref 36.0–46.0)
Hemoglobin: 12.3 g/dL (ref 12.0–15.0)
MCH: 30.4 pg (ref 26.0–34.0)
MCHC: 31.6 g/dL (ref 30.0–36.0)
MCV: 96 fL (ref 78.0–100.0)
PLATELETS: 297 10*3/uL (ref 150–400)
RBC: 4.05 MIL/uL (ref 3.87–5.11)
RDW: 14.5 % (ref 11.5–15.5)
WBC: 7.7 10*3/uL (ref 4.0–10.5)

## 2017-09-19 LAB — COMPREHENSIVE METABOLIC PANEL
ALT: 18 U/L (ref 0–44)
AST: 18 U/L (ref 15–41)
Albumin: 3.7 g/dL (ref 3.5–5.0)
Alkaline Phosphatase: 69 U/L (ref 38–126)
Anion gap: 13 (ref 5–15)
BUN: 25 mg/dL — AB (ref 8–23)
CHLORIDE: 101 mmol/L (ref 98–111)
CO2: 24 mmol/L (ref 22–32)
CREATININE: 2.06 mg/dL — AB (ref 0.44–1.00)
Calcium: 9.6 mg/dL (ref 8.9–10.3)
GFR calc Af Amer: 28 mL/min — ABNORMAL LOW (ref >60)
GFR calc non Af Amer: 24 mL/min — ABNORMAL LOW (ref >60)
GLUCOSE: 135 mg/dL — AB (ref 70–99)
Potassium: 4.3 mmol/L (ref 3.5–5.1)
Sodium: 138 mmol/L (ref 135–145)
Total Bilirubin: 0.5 mg/dL (ref 0.3–1.2)
Total Protein: 7.7 g/dL (ref 6.5–8.1)

## 2017-09-19 NOTE — ED Triage Notes (Signed)
Pt reports abd pain cramping X 1 week, intermittent. Denies N/V/D.

## 2017-09-20 ENCOUNTER — Emergency Department (HOSPITAL_COMMUNITY): Payer: BLUE CROSS/BLUE SHIELD

## 2017-09-20 DIAGNOSIS — K5900 Constipation, unspecified: Secondary | ICD-10-CM | POA: Diagnosis not present

## 2017-09-20 MED ORDER — CEPHALEXIN 500 MG PO CAPS: 500.0000 mg | ORAL_CAPSULE | Freq: Two times a day (BID) | ORAL | 0 refills | Status: DC

## 2017-09-20 MED ORDER — CEPHALEXIN 500 MG PO CAPS: 500.0000 mg | ORAL_CAPSULE | Freq: Three times a day (TID) | ORAL | 0 refills | Status: DC

## 2017-09-20 MED ORDER — SODIUM CHLORIDE 0.9 % IV SOLN
1.0000 g | Freq: Once | INTRAVENOUS | Status: AC
  Administered 2017-09-20: 1 g via INTRAVENOUS
  Filled 2017-09-20: qty 10

## 2017-09-20 MED ORDER — SODIUM CHLORIDE 0.9 % IV BOLUS
1000.0000 mL | Freq: Once | INTRAVENOUS | Status: AC
  Administered 2017-09-20: 1000 mL via INTRAVENOUS

## 2017-09-20 NOTE — Discharge Instructions (Signed)
Take the prescribed medication as directed.  Can increase your miralax at home to twice daily for now to get bowels moving a little better, then can reduce back to once daily.  Make sure to drink plenty of water. Your labs showed some signs of dehydration-- we recommend to have your kidney function re-checked soon. Follow-up with your primary care doctor. Return to the ED for new or worsening symptoms.

## 2017-09-20 NOTE — ED Provider Notes (Signed)
[facility] EMERGENCY DEPARTMENT Provider Note   CSN: [id number] Arrival date & time: 09/19/17  2123     History   Chief Complaint Chief Complaint  Patient presents with  . Abdominal Pain    HPI Kristi Campos is a 67 y.o. female.  The history is provided by the patient and medical records.  Abdominal Pain   Associated symptoms include nausea and vomiting.    67 year old female with history of diabetes, uterine fibroids, hypertension, presenting to the ED with intermittent abdominal pain.  Reports for the past week she is been having some intermittent cramping sensations and feeling of fullness in her lower abdomen.  States this comes and goes, sometimes random but has noticed increased sensation of fullness after eating.  She denies any nausea, vomiting, or diarrhea.  She reports some urinary frequency but denies any dysuria.  Does admit when she urinates sometimes she just urinates a small amount.  States she does work in a hot environment but tries to drink plenty of water.  She denies any fever or chills.  States she has been having bowel movements every day or every other day, the last few have been somewhat harder in caliber.  States she does take miralax when she starts to feel constipated.  Past Medical History:  Diagnosis Date  . Diabetes mellitus without complication ([place])   . Fibroid uterus   . Hypertension     There are no active problems to display for this patient.   Past Surgical History:  Procedure Laterality Date  . I&D EXTREMITY Right 08/09/2013   Procedure: MINOR IRRIGATION AND DEBRIDEMENT EXTREMITY;  Surgeon: Cammie Sickle, MD;  Location: [facility];  Service: Orthopedics;  Laterality: Right;  long       wound class 4     OB History    Gravida  0   Para  0   Term  0   Preterm  0   AB  0   Living  0     SAB  0   TAB  0   Ectopic  0   Multiple  0   Live Births               Home Medications      Prior to Admission medications   Medication Sig Start Date End Date Taking? Authorizing Provider  aspirin EC 81 MG tablet Take 81 mg by mouth daily.    [provider]  azithromycin (ZITHROMAX Z-PAK) 250 MG tablet Take as directed. Patient not taking: Reported on 01/22/2017 10/30/14   Shelly Bombard, MD  cephALEXin [facility]) 500 MG capsule 2 caps po bid x 5 days Patient not taking: Reported on 01/22/2017 02/23/16   Street, [place], PA-C  cyclobenzaprine (FLEXERIL) 10 MG tablet Take 1 tablet (10 mg total) by mouth 2 (two) times daily as needed for muscle spasms. Patient not taking: Reported on 01/22/2017 04/06/15   Konrad Felix, PA  guaiFENesin-codeine (GUAIFENESIN AC) 100-10 MG/5ML syrup Take 5 mLs by mouth 3 (three) times daily as needed for cough. Patient not taking: Reported on 02/25/2016 10/30/14   Shelly Bombard, MD  losartan-hydrochlorothiazide [facility]) 100-12.5 MG per tablet Take 1 tablet by mouth daily.  10/08/14   [provider]  metFORMIN (GLUCOPHAGE) 500 MG tablet Take 500 mg by mouth 2 (two) times daily with a meal.    [provider]  naproxen (NAPROSYN) 500 MG tablet Take 1 tablet (500 mg total) by  mouth 2 (two) times daily. Patient not taking: Reported on 04/18/2017 04/06/15   Linde Gillis C, PA  polyethylene glycol [facility] / GLYCOLAX) packet Take 17 g by mouth 2 (two) times daily. Until stooling regularly 07/03/13   Hilton Sinclair, MD    Family History No family history on file.  Social History Social History   Tobacco Use  . Smoking status: Never Smoker  . Smokeless tobacco: Never Used  Substance Use Topics  . Alcohol use: No    Alcohol/week: 0.0 oz  . Drug use: No     Allergies   Patient has no known allergies.   Review of Systems Review of Systems  Gastrointestinal: Positive for abdominal pain, nausea and vomiting.  All other systems reviewed and are negative.    Physical Exam Updated Vital Signs BP 126/77 (BP  Location: Right Arm)   Pulse 70   Temp 98.1 F (36.7 C) (Oral)   Resp 16   Ht 5\' 3"  (1.6 m)   Wt 86.2 kg (190 lb)   SpO2 (!) 70%   BMI 33.66 kg/m   Physical Exam  Constitutional: She is oriented to person, place, and time. She appears well-developed and well-nourished.  HENT:  Head: Normocephalic and atraumatic.  Mouth/Throat: Oropharynx is clear and moist.  Eyes: Pupils are equal, round, and reactive to light. Conjunctivae and EOM are normal.  Neck: Normal range of motion.  Cardiovascular: Normal rate, regular rhythm and normal heart sounds.  Pulmonary/Chest: Effort normal and breath sounds normal.  Abdominal: Soft. Bowel sounds are normal. There is no tenderness. There is no rigidity, no guarding and no tenderness at McBurney's point.  Soft, non-tender, no peritoneal signs No CVA tenderness  Musculoskeletal: Normal range of motion.  Neurological: She is alert and oriented to person, place, and time.  Skin: Skin is warm and dry.  Psychiatric: She has a normal mood and affect.  Nursing note and vitals reviewed.    ED Treatments / Results  Labs (all labs ordered are listed, but only abnormal results are displayed) Labs Reviewed  COMPREHENSIVE METABOLIC PANEL - Abnormal; Notable for the following components:      Result Value   Glucose, Bld 135 (*)    BUN 25 (*)    Creatinine, Ser 2.06 (*)    GFR calc non Af Amer 24 (*)    GFR calc Af Amer 28 (*)    All other components within normal limits  URINALYSIS, ROUTINE W REFLEX MICROSCOPIC - Abnormal; Notable for the following components:   Color, Urine AMBER (*)    APPearance CLOUDY (*)    Protein, ur 30 (*)    Leukocytes, UA TRACE (*)    Bacteria, UA MANY (*)    All other components within normal limits  URINE CULTURE  LIPASE, BLOOD  CBC    EKG None  Radiology Dg Abd Acute W/chest  Result Date: 09/20/2017 CLINICAL DATA:  Pain and fullness in the abdomen. Constipation for 4 days. EXAM: DG ABDOMEN ACUTE W/ 1V CHEST  COMPARISON:  Abdomen 07/03/2013 FINDINGS: Normal heart size and pulmonary vascularity. No focal airspace disease or consolidation in the lungs. No blunting of costophrenic angles. No pneumothorax. Mediastinal contours appear intact. Diffusely stool-filled colon. No small or large bowel distention. No free intra-abdominal air. No radiopaque stones. Visualized bones and soft tissue contours appear intact. Calcified phleboliths in the pelvis. IMPRESSION: No evidence of active pulmonary disease. Nonobstructive bowel gas pattern with diffusely stool-filled colon. Electronically Signed   By: Lucienne Capers  M.D.   On: 09/20/2017 02:00    Procedures Procedures (including critical care time)  Medications Ordered in ED Medications  sodium chloride 0.9 % bolus 1,000 mL (has no administration in time range)  cefTRIAXone (ROCEPHIN) 1 g in sodium chloride 0.9 % 100 mL IVPB (has no administration in time range)     Initial Impression / Assessment and Plan / ED Course  I have reviewed the triage vital signs and the nursing notes.  Pertinent labs & imaging results that were available during my care of the patient were reviewed by me and considered in my medical decision making (see chart for details).  67 y.o. F here with intermittent lower abdominal pain for the past week.  Sensation of fullness, somewhat worse after eating.  No vomiting/diarrhea.  Stools more firm lately, some urinary frequency as well.  She is afebrile, non-toxic.  Abdomen soft, benign.  Labs overall reassuring-- SrCr elevated today at 2.06, BUN 25.  Last on record from December 2017, 1.05 with BUN 22 at that time.  Patient does report working in a hot environment lately but has been trying to drink a little bit of water.  Some of this may be progression of kidney disease with superimposed dehydration.  UA also appears infectious with many bacteria, culture pending.  Patient given liter of fluids here as well as a run of IV Rocephin.  Acute  abdominal series was obtained, stool-filled colon.  Patient tolerating PO well here.  No obstructive type symptoms.  Do not feel she needs CT scan at this time.  We will continue Keflex pending urine culture.  We will also have her increase her home MiraLAX to twice daily to help with bowel movements, then can reduce back to once daily.  Encourage lots of oral fluids.  We will have her follow-up with her doctor for recheck of her creatinine this week.  She will return here for any new/acute changes.  Final Clinical Impressions(s) / ED Diagnoses   Final diagnoses:  Abdominal pain, generalized  Constipation, unspecified constipation type  Acute cystitis without hematuria    ED Discharge Orders        Ordered         cephALEXin (KEFLEX) 500 MG capsule  2 times daily     09/20/17 0411       Larene Pickett, PA-C 09/20/17 [id number]    Jola Schmidt, MD 09/20/17 [id number]

## 2017-09-21 LAB — URINE CULTURE

## 2017-11-04 DIAGNOSIS — M722 Plantar fascial fibromatosis: Secondary | ICD-10-CM | POA: Diagnosis not present

## 2017-11-04 DIAGNOSIS — M71572 Other bursitis, not elsewhere classified, left ankle and foot: Secondary | ICD-10-CM | POA: Diagnosis not present

## 2017-11-21 DIAGNOSIS — M722 Plantar fascial fibromatosis: Secondary | ICD-10-CM | POA: Diagnosis not present

## 2017-11-22 DIAGNOSIS — E1165 Type 2 diabetes mellitus with hyperglycemia: Secondary | ICD-10-CM | POA: Diagnosis not present

## 2017-11-22 DIAGNOSIS — K5730 Diverticulosis of large intestine without perforation or abscess without bleeding: Secondary | ICD-10-CM | POA: Diagnosis not present

## 2017-11-22 DIAGNOSIS — I10 Essential (primary) hypertension: Secondary | ICD-10-CM | POA: Diagnosis not present

## 2017-11-22 DIAGNOSIS — Z23 Encounter for immunization: Secondary | ICD-10-CM | POA: Diagnosis not present

## 2017-12-06 DIAGNOSIS — E1165 Type 2 diabetes mellitus with hyperglycemia: Secondary | ICD-10-CM | POA: Diagnosis not present

## 2017-12-06 DIAGNOSIS — I10 Essential (primary) hypertension: Secondary | ICD-10-CM | POA: Diagnosis not present

## 2018-01-18 DIAGNOSIS — M71571 Other bursitis, not elsewhere classified, right ankle and foot: Secondary | ICD-10-CM | POA: Diagnosis not present

## 2018-01-18 DIAGNOSIS — M722 Plantar fascial fibromatosis: Secondary | ICD-10-CM | POA: Diagnosis not present

## 2018-02-02 DIAGNOSIS — Z1211 Encounter for screening for malignant neoplasm of colon: Secondary | ICD-10-CM | POA: Diagnosis not present

## 2018-02-02 DIAGNOSIS — E669 Obesity, unspecified: Secondary | ICD-10-CM | POA: Diagnosis not present

## 2018-02-02 DIAGNOSIS — K5730 Diverticulosis of large intestine without perforation or abscess without bleeding: Secondary | ICD-10-CM | POA: Diagnosis not present

## 2018-02-02 DIAGNOSIS — K5900 Constipation, unspecified: Secondary | ICD-10-CM | POA: Diagnosis not present

## 2018-02-02 DIAGNOSIS — Z800 Family history of malignant neoplasm of digestive organs: Secondary | ICD-10-CM | POA: Diagnosis not present

## 2018-02-13 DIAGNOSIS — M722 Plantar fascial fibromatosis: Secondary | ICD-10-CM | POA: Diagnosis not present

## 2018-02-13 DIAGNOSIS — M71571 Other bursitis, not elsewhere classified, right ankle and foot: Secondary | ICD-10-CM | POA: Diagnosis not present

## 2018-02-16 DIAGNOSIS — M5136 Other intervertebral disc degeneration, lumbar region: Secondary | ICD-10-CM | POA: Diagnosis not present

## 2018-02-16 DIAGNOSIS — E1165 Type 2 diabetes mellitus with hyperglycemia: Secondary | ICD-10-CM | POA: Diagnosis not present

## 2018-02-16 DIAGNOSIS — I10 Essential (primary) hypertension: Secondary | ICD-10-CM | POA: Diagnosis not present

## 2018-02-16 DIAGNOSIS — K5730 Diverticulosis of large intestine without perforation or abscess without bleeding: Secondary | ICD-10-CM | POA: Diagnosis not present

## 2018-02-27 DIAGNOSIS — K635 Polyp of colon: Secondary | ICD-10-CM | POA: Diagnosis not present

## 2018-02-27 DIAGNOSIS — D125 Benign neoplasm of sigmoid colon: Secondary | ICD-10-CM | POA: Diagnosis not present

## 2018-02-27 DIAGNOSIS — Z1211 Encounter for screening for malignant neoplasm of colon: Secondary | ICD-10-CM | POA: Diagnosis not present

## 2018-02-27 DIAGNOSIS — Z800 Family history of malignant neoplasm of digestive organs: Secondary | ICD-10-CM | POA: Diagnosis not present

## 2018-02-27 DIAGNOSIS — K5730 Diverticulosis of large intestine without perforation or abscess without bleeding: Secondary | ICD-10-CM | POA: Diagnosis not present

## 2018-03-21 ENCOUNTER — Ambulatory Visit (HOSPITAL_COMMUNITY)
Admission: EM | Admit: 2018-03-21 | Discharge: 2018-03-21 | Disposition: A | Discharge status: Home / Self Care | Payer: BLUE CROSS/BLUE SHIELD | Attending: Family Medicine | Admitting: Family Medicine

## 2018-03-21 ENCOUNTER — Encounter (HOSPITAL_COMMUNITY): Payer: Self-pay

## 2018-03-21 DIAGNOSIS — L509 Urticaria, unspecified: Secondary | ICD-10-CM | POA: Insufficient documentation

## 2018-03-21 DIAGNOSIS — L500 Allergic urticaria: Secondary | ICD-10-CM

## 2018-03-21 MED ORDER — CETIRIZINE HCL 10 MG PO TABS: 10.0000 mg | ORAL_TABLET | Freq: Two times a day (BID) | ORAL | 0 refills | Status: DC

## 2018-03-21 MED ORDER — METHYLPREDNISOLONE ACETATE 80 MG/ML IJ SUSP
80.0000 mg | Freq: Once | INTRAMUSCULAR | Status: AC
  Administered 2018-03-21: 80 mg via INTRAMUSCULAR

## 2018-03-21 MED ORDER — METHYLPREDNISOLONE ACETATE 80 MG/ML IJ SUSP
INTRAMUSCULAR | Status: AC
  Filled 2018-03-21: qty 1

## 2018-03-21 NOTE — ED Provider Notes (Signed)
[facility]    CSN: [id number] Arrival date & time: 03/21/18  1921     History   Chief Complaint Chief Complaint  Patient presents with  . Allergic Reaction    HPI Kristi Campos is a 68 y.o. female.   HPI  Patient is here for an allergic reaction.  Yesterday while at work she developed a rash.  No new chemicals at work.  No new foods.  No new soap lotion or powder.  She does not know what might have caused this.  She is never had this before.  She has very itchy "lumps" on her skin around her neck and upper back.  No trouble breathing.  No trouble speaking.  No trouble swallowing.  Past Medical History:  Diagnosis Date  . Diabetes mellitus without complication ([place])   . Fibroid uterus   . Hypertension     There are no active problems to display for this patient.   Past Surgical History:  Procedure Laterality Date  . I&D EXTREMITY Right 08/09/2013   Procedure: MINOR IRRIGATION AND DEBRIDEMENT EXTREMITY;  Surgeon: Cammie Sickle, MD;  Location: [facility];  Service: Orthopedics;  Laterality: Right;  long       wound class 4    OB History    Gravida  0   Para  0   Term  0   Preterm  0   AB  0   Living  0     SAB  0   TAB  0   Ectopic  0   Multiple  0   Live Births               Home Medications    Prior to Admission medications   Medication Sig Start Date End Date Taking? Authorizing Provider  aspirin EC 81 MG tablet Take 81 mg by mouth daily.    [provider]  cetirizine (ZYRTEC) 10 MG tablet Take 1 tablet (10 mg total) by mouth 2 (two) times daily. 03/21/18   Raylene Everts, MD  losartan-hydrochlorothiazide (HYZAAR) 100-12.5 MG per tablet Take 1 tablet by mouth daily.  10/08/14   [provider]  metFORMIN (GLUCOPHAGE) 500 MG tablet Take 500 mg by mouth 2 (two) times daily with a meal.    [provider]  polyethylene glycol (MIRALAX / GLYCOLAX) packet Take 17 g by mouth 2 (two)  times daily. Until stooling regularly 07/03/13   Hilton Sinclair, MD    Family History History reviewed. No pertinent family history.  Social History Social History   Tobacco Use  . Smoking status: Never Smoker  . Smokeless tobacco: Never Used  Substance Use Topics  . Alcohol use: No    Alcohol/week: 0.0 standard drinks  . Drug use: No     Allergies   Patient has no known allergies.   Review of Systems Review of Systems  Constitutional: Negative for chills and fever.  HENT: Negative for ear pain and sore throat.   Eyes: Negative for pain and visual disturbance.  Respiratory: Negative for cough and shortness of breath.   Cardiovascular: Negative for chest pain and palpitations.  Gastrointestinal: Negative for abdominal pain and vomiting.  Genitourinary: Negative for dysuria and hematuria.  Musculoskeletal: Negative for arthralgias and back pain.  Skin: Positive for rash. Negative for color change.  Neurological: Negative for seizures and syncope.  All other systems reviewed and are negative.    Physical Exam Triage Vital Signs ED Triage Vitals  Enc Vitals Group     BP 03/21/18 1956 133/74     Pulse Rate 03/21/18 1956 96     Resp 03/21/18 1956 18     Temp 03/21/18 1956 98.3 F (36.8 C)     Temp Source 03/21/18 1956 Oral     SpO2 03/21/18 1956 100 %     Weight --    No data found.  Updated Vital Signs BP 133/74 (BP Location: Right Arm)   Pulse 96   Temp 98.3 F (36.8 C) (Oral)   Resp 18   SpO2 100%      Physical Exam Constitutional:      General: She is not in acute distress.    Appearance: She is well-developed.  HENT:     Head: Normocephalic and atraumatic.  Eyes:     Conjunctiva/sclera: Conjunctivae normal.     Pupils: Pupils are equal, round, and reactive to light.  Neck:     Musculoskeletal: Normal range of motion.  Cardiovascular:     Rate and Rhythm: Normal rate.  Pulmonary:     Effort: Pulmonary effort is normal. No respiratory  distress.  Abdominal:     General: There is no distension.     Palpations: Abdomen is soft.  Musculoskeletal: Normal range of motion.  Skin:    General: Skin is warm and dry.     Comments: Patient has urticarial wheals, erythematous, well-circumscribed, some excoriated around her neck and upper back.  Neurological:     General: No focal deficit present.     Mental Status: She is alert. Mental status is at baseline.  Psychiatric:        Mood and Affect: Mood normal.      UC Treatments / Results  Labs (all labs ordered are listed, but only abnormal results are displayed) Labs Reviewed - No data to display  EKG None  Radiology No results found.  Procedures Procedures (including critical care time)  Medications Ordered in UC Medications  methylPREDNISolone acetate (DEPO-MEDROL) injection 80 mg (has no administration in time range)    Initial Impression / Assessment and Plan / UC Course  I have reviewed the triage vital signs and the nursing notes.  Pertinent labs & imaging results that were available during my care of the patient were reviewed by me and considered in my medical decision making (see chart for details).    View causes of allergic reaction.  Difficulty in determining what might of caused it, ease of treatment.  I told her if she has recurring hives, she should initially treat with antihistamines.  If she requires medical visits Final Clinical Impressions(s) / UC Diagnoses   Final diagnoses:  Urticaria     Discharge Instructions     We have given you a shot of prednisone.  This is for the allergic reaction Take the zyrtec 2 x a day Expect improvement over a couple of days See your PCP if you have more reactions, and get allergy testing    ED Prescriptions    Medication Sig Dispense Auth. Provider   cetirizine (ZYRTEC) 10 MG tablet Take 1 tablet (10 mg total) by mouth 2 (two) times daily. 20 tablet Raylene Everts, MD     Controlled Substance  Prescriptions [place] Controlled Substance Registry consulted? Not Applicable   Raylene Everts, MD 03/21/18 2030

## 2018-03-21 NOTE — ED Triage Notes (Signed)
Pt presents with allergic reaction from unknown source; pt complains of itchiness all over.

## 2018-03-21 NOTE — Discharge Instructions (Signed)
We have given you a shot of prednisone.  This is for the allergic reaction Take the zyrtec 2 x a day Expect improvement over a couple of days See your PCP if you have more reactions, and get allergy testing

## 2018-05-18 DIAGNOSIS — Z0000 Encounter for general adult medical examination without abnormal findings: Secondary | ICD-10-CM | POA: Diagnosis not present

## 2018-05-18 DIAGNOSIS — E1165 Type 2 diabetes mellitus with hyperglycemia: Secondary | ICD-10-CM | POA: Diagnosis not present

## 2018-05-18 DIAGNOSIS — I10 Essential (primary) hypertension: Secondary | ICD-10-CM | POA: Diagnosis not present

## 2018-05-18 DIAGNOSIS — K219 Gastro-esophageal reflux disease without esophagitis: Secondary | ICD-10-CM | POA: Diagnosis not present

## 2018-05-18 DIAGNOSIS — M5136 Other intervertebral disc degeneration, lumbar region: Secondary | ICD-10-CM | POA: Diagnosis not present

## 2018-05-29 ENCOUNTER — Other Ambulatory Visit: Payer: Self-pay | Admitting: Obstetrics

## 2018-05-29 DIAGNOSIS — Z1231 Encounter for screening mammogram for malignant neoplasm of breast: Secondary | ICD-10-CM

## 2018-06-27 DIAGNOSIS — M5136 Other intervertebral disc degeneration, lumbar region: Secondary | ICD-10-CM | POA: Diagnosis not present

## 2018-06-27 DIAGNOSIS — I10 Essential (primary) hypertension: Secondary | ICD-10-CM | POA: Diagnosis not present

## 2018-06-27 DIAGNOSIS — E1165 Type 2 diabetes mellitus with hyperglycemia: Secondary | ICD-10-CM | POA: Diagnosis not present

## 2018-07-25 ENCOUNTER — Other Ambulatory Visit: Payer: Self-pay

## 2018-07-25 ENCOUNTER — Ambulatory Visit
Admission: RE | Admit: 2018-07-25 | Discharge: 2018-07-25 | Disposition: A | Discharge status: Home / Self Care | Payer: BLUE CROSS/BLUE SHIELD | Source: Ambulatory Visit | Attending: Obstetrics | Admitting: Obstetrics

## 2018-07-25 DIAGNOSIS — M545 Low back pain: Secondary | ICD-10-CM | POA: Diagnosis not present

## 2018-07-25 DIAGNOSIS — Z1231 Encounter for screening mammogram for malignant neoplasm of breast: Secondary | ICD-10-CM | POA: Diagnosis not present

## 2018-07-25 DIAGNOSIS — M4696 Unspecified inflammatory spondylopathy, lumbar region: Secondary | ICD-10-CM | POA: Diagnosis not present

## 2018-07-25 DIAGNOSIS — M5136 Other intervertebral disc degeneration, lumbar region: Secondary | ICD-10-CM | POA: Diagnosis not present

## 2018-08-01 DIAGNOSIS — M722 Plantar fascial fibromatosis: Secondary | ICD-10-CM | POA: Diagnosis not present

## 2018-08-01 DIAGNOSIS — M71571 Other bursitis, not elsewhere classified, right ankle and foot: Secondary | ICD-10-CM | POA: Diagnosis not present

## 2018-08-04 DIAGNOSIS — M545 Low back pain: Secondary | ICD-10-CM | POA: Diagnosis not present

## 2018-08-07 DIAGNOSIS — M545 Low back pain: Secondary | ICD-10-CM | POA: Diagnosis not present

## 2018-08-08 DIAGNOSIS — E1165 Type 2 diabetes mellitus with hyperglycemia: Secondary | ICD-10-CM | POA: Diagnosis not present

## 2018-08-08 DIAGNOSIS — M5136 Other intervertebral disc degeneration, lumbar region: Secondary | ICD-10-CM | POA: Diagnosis not present

## 2018-08-08 DIAGNOSIS — K219 Gastro-esophageal reflux disease without esophagitis: Secondary | ICD-10-CM | POA: Diagnosis not present

## 2018-08-08 DIAGNOSIS — I10 Essential (primary) hypertension: Secondary | ICD-10-CM | POA: Diagnosis not present

## 2018-08-09 DIAGNOSIS — M545 Low back pain: Secondary | ICD-10-CM | POA: Diagnosis not present

## 2018-08-14 DIAGNOSIS — M545 Low back pain: Secondary | ICD-10-CM | POA: Diagnosis not present

## 2018-08-15 DIAGNOSIS — M545 Low back pain: Secondary | ICD-10-CM | POA: Diagnosis not present

## 2018-08-15 DIAGNOSIS — G894 Chronic pain syndrome: Secondary | ICD-10-CM | POA: Diagnosis not present

## 2018-08-16 DIAGNOSIS — M71571 Other bursitis, not elsewhere classified, right ankle and foot: Secondary | ICD-10-CM | POA: Diagnosis not present

## 2018-08-16 DIAGNOSIS — M722 Plantar fascial fibromatosis: Secondary | ICD-10-CM | POA: Diagnosis not present

## 2018-08-18 DIAGNOSIS — E1165 Type 2 diabetes mellitus with hyperglycemia: Secondary | ICD-10-CM | POA: Diagnosis not present

## 2018-08-18 DIAGNOSIS — I10 Essential (primary) hypertension: Secondary | ICD-10-CM | POA: Diagnosis not present

## 2018-08-18 DIAGNOSIS — M545 Low back pain: Secondary | ICD-10-CM | POA: Diagnosis not present

## 2018-08-18 DIAGNOSIS — M5136 Other intervertebral disc degeneration, lumbar region: Secondary | ICD-10-CM | POA: Diagnosis not present

## 2018-08-21 DIAGNOSIS — M545 Low back pain: Secondary | ICD-10-CM | POA: Diagnosis not present

## 2018-08-25 DIAGNOSIS — M545 Low back pain: Secondary | ICD-10-CM | POA: Diagnosis not present

## 2018-08-28 DIAGNOSIS — M545 Low back pain: Secondary | ICD-10-CM | POA: Diagnosis not present

## 2018-08-31 DIAGNOSIS — M545 Low back pain: Secondary | ICD-10-CM | POA: Diagnosis not present

## 2018-09-05 DIAGNOSIS — M545 Low back pain: Secondary | ICD-10-CM | POA: Diagnosis not present

## 2018-09-07 DIAGNOSIS — K219 Gastro-esophageal reflux disease without esophagitis: Secondary | ICD-10-CM | POA: Diagnosis not present

## 2018-09-07 DIAGNOSIS — M5136 Other intervertebral disc degeneration, lumbar region: Secondary | ICD-10-CM | POA: Diagnosis not present

## 2018-09-07 DIAGNOSIS — I10 Essential (primary) hypertension: Secondary | ICD-10-CM | POA: Diagnosis not present

## 2018-09-07 DIAGNOSIS — E1165 Type 2 diabetes mellitus with hyperglycemia: Secondary | ICD-10-CM | POA: Diagnosis not present

## 2018-09-13 DIAGNOSIS — M545 Low back pain: Secondary | ICD-10-CM | POA: Diagnosis not present

## 2018-09-20 DIAGNOSIS — M545 Low back pain: Secondary | ICD-10-CM | POA: Diagnosis not present

## 2018-10-03 DIAGNOSIS — K5730 Diverticulosis of large intestine without perforation or abscess without bleeding: Secondary | ICD-10-CM | POA: Diagnosis not present

## 2018-10-03 DIAGNOSIS — I10 Essential (primary) hypertension: Secondary | ICD-10-CM | POA: Diagnosis not present

## 2018-10-03 DIAGNOSIS — K219 Gastro-esophageal reflux disease without esophagitis: Secondary | ICD-10-CM | POA: Diagnosis not present

## 2018-10-03 DIAGNOSIS — E1165 Type 2 diabetes mellitus with hyperglycemia: Secondary | ICD-10-CM | POA: Diagnosis not present

## 2018-10-03 DIAGNOSIS — Z23 Encounter for immunization: Secondary | ICD-10-CM | POA: Diagnosis not present

## 2018-10-06 ENCOUNTER — Ambulatory Visit (HOSPITAL_COMMUNITY)
Admission: EM | Admit: 2018-10-06 | Discharge: 2018-10-06 | Disposition: A | Discharge status: Home / Self Care | Payer: Medicare Other | Attending: Family Medicine | Admitting: Family Medicine

## 2018-10-06 ENCOUNTER — Other Ambulatory Visit: Payer: Self-pay

## 2018-10-06 ENCOUNTER — Encounter (HOSPITAL_COMMUNITY): Payer: Self-pay

## 2018-10-06 ENCOUNTER — Ambulatory Visit (INDEPENDENT_AMBULATORY_CARE_PROVIDER_SITE_OTHER): Payer: Medicare Other

## 2018-10-06 DIAGNOSIS — M545 Low back pain, unspecified: Secondary | ICD-10-CM

## 2018-10-06 MED ORDER — METHOCARBAMOL 500 MG PO TABS: 500.0000 mg | ORAL_TABLET | Freq: Two times a day (BID) | ORAL | 0 refills | Status: DC

## 2018-10-06 MED ORDER — MELOXICAM 7.5 MG PO TABS: 7.5000 mg | ORAL_TABLET | Freq: Every day | ORAL | 0 refills | Status: AC

## 2018-10-06 NOTE — Discharge Instructions (Signed)
X ray without fracture Mobic daily for the next 1-2 weeks Robaxin twice daily, may cause drowsiness Continue to move around and go about daily activities  Follow-up in emergency room if developing headache, vision change, dizziness, lightheadedness, weakness, difficulty speaking, issues controlling urination or bowel movements

## 2018-10-06 NOTE — ED Triage Notes (Signed)
Patient presents to Urgent Care with complaints of falling in the bathroom since 2 days ago. Patient reports she did hit her head on the door, no LOC. Pt reports lower back pain, worse on the right. Was able to get off the floor without assistance, walks with cane baseline.

## 2018-10-06 NOTE — ED Provider Notes (Signed)
[facility]    CSN: [id number] Arrival date & time: 10/06/18  5465     History   Chief Complaint Chief Complaint  Patient presents with  . Fall    HPI Kristi Campos is a 68 y.o. female history of DM type II, hypertension, presenting today for evaluation of back pain secondary to a fall.  Patient was in the bathroom 2 days ago and fell and landed on her back.  She does state that she hit her head on the way down.  Denies loss of consciousness.  She states that occasionally when she stands up quickly she feels her right leg gives out.  She denies persistent weakness on the right side.  Denies headache, vision changes, dizziness or lightheadedness that triggered fall.  Since she has not developed any of the symptoms either.  Her main concern is right lower back pain.  She is not taking any medicine for symptoms.  She denies radiation.  Denies bowel or bladder dysfunction.  Denies numbness or tingling.  Denies fevers.  She does note that she has a history of a herniated disc which she previously went to therapy for.  HPI  Past Medical History:  Diagnosis Date  . Diabetes mellitus without complication ([place])   . Fibroid uterus   . Hypertension     There are no active problems to display for this patient.   Past Surgical History:  Procedure Laterality Date  . I&D EXTREMITY Right 08/09/2013   Procedure: MINOR IRRIGATION AND DEBRIDEMENT EXTREMITY;  Surgeon: Cammie Sickle, MD;  Location: [facility];  Service: Orthopedics;  Laterality: Right;  long       wound class 4    OB History    Gravida  0   Para  0   Term  0   Preterm  0   AB  0   Living  0     SAB  0   TAB  0   Ectopic  0   Multiple  0   Live Births               Home Medications    Prior to Admission medications   Medication Sig Start Date End Date Taking? Authorizing Provider  aspirin EC 81 MG tablet Take 81 mg by mouth daily.    [provider]  cetirizine  (ZYRTEC) 10 MG tablet Take 1 tablet (10 mg total) by mouth 2 (two) times daily. 03/21/18   Raylene Everts, MD  losartan-hydrochlorothiazide (HYZAAR) 100-12.5 MG per tablet Take 1 tablet by mouth daily.  10/08/14   [provider]  meloxicam (MOBIC) 7.5 MG tablet Take 1 tablet (7.5 mg total) by mouth daily for 10 days. Take in the morning, with food. 10/06/18 10/16/18  Brode Sculley C, PA-C  metFORMIN (GLUCOPHAGE) 500 MG tablet Take 500 mg by mouth 2 (two) times daily with a meal.    [provider]  methocarbamol (ROBAXIN) 500 MG tablet Take 1 tablet (500 mg total) by mouth 2 (two) times daily. 10/06/18   Lilienne Weins C, PA-C  polyethylene glycol (MIRALAX / GLYCOLAX) packet Take 17 g by mouth 2 (two) times daily. Until stooling regularly 07/03/13   Hilton Sinclair, MD    Family History Family History  Family history unknown: Yes    Social History Social History   Tobacco Use  . Smoking status: Never Smoker  . Smokeless tobacco: Never Used  Substance Use Topics  . Alcohol use:  No    Alcohol/week: 0.0 standard drinks  . Drug use: No     Allergies   Patient has no known allergies.   Review of Systems Review of Systems  Constitutional: Negative for fatigue and fever.  HENT: Negative for congestion, sinus pressure and sore throat.   Eyes: Negative for photophobia, pain and visual disturbance.  Respiratory: Negative for cough and shortness of breath.   Cardiovascular: Negative for chest pain.  Gastrointestinal: Negative for abdominal pain, nausea and vomiting.  Genitourinary: Negative for decreased urine volume, difficulty urinating and hematuria.  Musculoskeletal: Positive for back pain and myalgias. Negative for neck pain and neck stiffness.  Neurological: Negative for dizziness, syncope, facial asymmetry, speech difficulty, weakness, light-headedness, numbness and headaches.     Physical Exam Triage Vital Signs ED Triage Vitals  Enc Vitals Group      BP 10/06/18 0951 (!) 174/78     Pulse Rate 10/06/18 0951 (!) 104     Resp 10/06/18 0951 17     Temp 10/06/18 0951 98.2 F (36.8 C)     Temp Source 10/06/18 0951 Oral     SpO2 10/06/18 0951 100 %     Weight --      Height --      Head Circumference --      Peak Flow --      Pain Score 10/06/18 0950 4     Pain Loc --      Pain Edu? --      Excl. in [place]? --    No data found.  Updated Vital Signs BP 125/86 (BP Location: Left Arm)   Pulse (!) 104   Temp 98.2 F (36.8 C) (Oral)   Resp 17   SpO2 100%   Visual Acuity Right Eye Distance:   Left Eye Distance:   Bilateral Distance:    Right Eye Near:   Left Eye Near:    Bilateral Near:     Physical Exam Vitals signs and nursing note reviewed.  Constitutional:      General: She is not in acute distress.    Appearance: She is well-developed.  HENT:     Head: Normocephalic and atraumatic.     Ears:     Comments: Bilateral ears without tenderness to palpation of external auricle, tragus and mastoid, EAC's without erythema or swelling, TM's with good bony landmarks and cone of light. Non erythematous.    Mouth/Throat:     Comments: Oral mucosa pink and moist, no tonsillar enlargement or exudate. Posterior pharynx patent and nonerythematous, no uvula deviation or swelling. Normal phonation. Palate elevates symmetrically Eyes:     Extraocular Movements: Extraocular movements intact.     Conjunctiva/sclera: Conjunctivae normal.     Pupils: Pupils are equal, round, and reactive to light.  Neck:     Musculoskeletal: Neck supple.  Cardiovascular:     Rate and Rhythm: Normal rate and regular rhythm.     Heart sounds: No murmur.  Pulmonary:     Effort: Pulmonary effort is normal. No respiratory distress.     Breath sounds: Normal breath sounds.  Abdominal:     Palpations: Abdomen is soft.     Tenderness: There is no abdominal tenderness.  Musculoskeletal:     Comments: Nontender to palpation cervical, thoracic and lumbar spine  midline, nontender to palpation throughout bilateral lumbar and upper gluteal/sacral musculature. Negative straight leg raise  Skin:    General: Skin is warm and dry.  Neurological:     General: No  focal deficit present.     Mental Status: She is alert and oriented to person, place, and time. Mental status is at baseline.     Comments: Patient A&O x3, cranial nerves II-XII grossly intact, strength at shoulders, hips and knees 5/5, equal bilaterally, patellar reflex 1+ bilaterally.  Ambulating with cane, but independently.      UC Treatments / Results  Labs (all labs ordered are listed, but only abnormal results are displayed) Labs Reviewed - No data to display  EKG   Radiology Dg Lumbar Spine Complete  Result Date: 10/06/2018 CLINICAL DATA:  Low back pain after fall EXAM: LUMBAR SPINE - COMPLETE 4+ VIEW COMPARISON:  04/06/2015 FINDINGS: Five lumbar type vertebral segments. The T12 segment has hypoplastic appearing ribs. Vertebral body heights and alignment are maintained. No fracture identified. Intervertebral disc height loss of L5-S1. Moderate lower lumbar facet arthrosis. Bones appear demineralized. IMPRESSION: No acute fracture or subluxation of the lumbar spine. Electronically Signed   By: Davina Poke M.D.   On: 10/06/2018 10:49    Procedures Procedures (including critical care time)  Medications Ordered in UC Medications - No data to display  Initial Impression / Assessment and Plan / UC Course  I have reviewed the triage vital signs and the nursing notes.  Pertinent labs & imaging results that were available during my care of the patient were reviewed by me and considered in my medical decision making (see chart for details).  Clinical Course as of Oct 05 1057  Fri Oct 06, 2018  1024 Vitals rechecked, heart rate 92, O2 99     [HW]    Clinical Course User Index [HW] Joshus Rogan, [place] C, PA-C    X-ray negative for acute bony abnormality.  Blood pressure rechecked  and is much more reassuring.  No red flags for cauda equina, no neuro deficit.  Do not suspect underlying stroke at this time.  Fall seems to have been triggered by a more MSK cause than true neurologic weakness.  Discussed stroke signs and symptoms moving forward for patient to monitor to follow-up in emergency room for.  Will treat with Mobic and Robaxin at this time.  Discussed drowsiness regarding muscle relaxers.Discussed strict return precautions. Patient verbalized understanding and is agreeable with plan.  Final Clinical Impressions(s) / UC Diagnoses   Final diagnoses:  Acute right-sided low back pain without sciatica     Discharge Instructions     X ray without fracture Mobic daily for the next 1-2 weeks Robaxin twice daily, may cause drowsiness Continue to move around and go about daily activities  Follow-up in emergency room if developing headache, vision change, dizziness, lightheadedness, weakness, difficulty speaking, issues controlling urination or bowel movements    ED Prescriptions    Medication Sig Dispense Auth. Provider   meloxicam (MOBIC) 7.5 MG tablet Take 1 tablet (7.5 mg total) by mouth daily for 10 days. Take in the morning, with food. 20 tablet Mykelti Goldenstein C, PA-C   methocarbamol (ROBAXIN) 500 MG tablet Take 1 tablet (500 mg total) by mouth 2 (two) times daily. 20 tablet Jawaan Adachi, [place] C, PA-C     Controlled Substance Prescriptions [place] Controlled Substance Registry consulted? Not Applicable   Janith Lima, [place] 10/06/18 1101

## 2018-10-13 ENCOUNTER — Other Ambulatory Visit: Payer: Self-pay

## 2018-10-13 ENCOUNTER — Encounter (HOSPITAL_COMMUNITY): Payer: Self-pay

## 2018-10-13 ENCOUNTER — Ambulatory Visit (HOSPITAL_COMMUNITY)
Admission: EM | Admit: 2018-10-13 | Discharge: 2018-10-13 | Disposition: A | Discharge status: Home / Self Care | Payer: BC Managed Care – PPO | Attending: Urgent Care | Admitting: Urgent Care

## 2018-10-13 DIAGNOSIS — N644 Mastodynia: Secondary | ICD-10-CM

## 2018-10-13 DIAGNOSIS — I10 Essential (primary) hypertension: Secondary | ICD-10-CM

## 2018-10-13 DIAGNOSIS — R944 Abnormal results of kidney function studies: Secondary | ICD-10-CM

## 2018-10-13 DIAGNOSIS — E1165 Type 2 diabetes mellitus with hyperglycemia: Secondary | ICD-10-CM

## 2018-10-13 MED ORDER — PREDNISONE 20 MG PO TABS: ORAL_TABLET | ORAL | 0 refills | Status: DC

## 2018-10-13 MED ORDER — VALACYCLOVIR HCL 1 G PO TABS: 1000.0000 mg | ORAL_TABLET | Freq: Every day | ORAL | 0 refills | Status: AC

## 2018-10-13 NOTE — ED Triage Notes (Signed)
PT present right breast pain, symptoms started yesterday. Pt denies any discharge from her breast.

## 2018-10-13 NOTE — ED Provider Notes (Signed)
MRN: [id number] DOB: March 23, 1950  Subjective:   Kristi Campos is a 68 y.o. female presenting for 1 day history of right sided breast pain. Has not tried medications for relief. Has always had normal mammograms. Denies fever, nipple discharge, nipple inversion, skin changes, warmth, swelling, masses, redness.  Patient has not tried any medications for relief.  She is an uncontrolled diabetic, last blood sugar check was this morning and was in the 180s.  She also has decreased GFR which the patient is unaware of, last GFR was 28 on 09/19/2017.  No current facility-administered medications for this encounter.   Current Outpatient Medications:  .  aspirin EC 81 MG tablet, Take 81 mg by mouth daily., Disp: , Rfl:  .  cetirizine (ZYRTEC) 10 MG tablet, Take 1 tablet (10 mg total) by mouth 2 (two) times daily., Disp: 20 tablet, Rfl: 0 .  losartan-hydrochlorothiazide (HYZAAR) 100-12.5 MG per tablet, Take 1 tablet by mouth daily. , Disp: , Rfl:  .  meloxicam (MOBIC) 7.5 MG tablet, Take 1 tablet (7.5 mg total) by mouth daily for 10 days. Take in the morning, with food., Disp: 20 tablet, Rfl: 0 .  metFORMIN (GLUCOPHAGE) 500 MG tablet, Take 500 mg by mouth 2 (two) times daily with a meal., Disp: , Rfl:  .  methocarbamol (ROBAXIN) 500 MG tablet, Take 1 tablet (500 mg total) by mouth 2 (two) times daily., Disp: 20 tablet, Rfl: 0 .  polyethylene glycol (MIRALAX / GLYCOLAX) packet, Take 17 g by mouth 2 (two) times daily. Until stooling regularly, Disp: 30 packet, Rfl: 0   No Known Allergies  Past Medical History:  Diagnosis Date  . Diabetes mellitus without complication ([place])   . Fibroid uterus   . Hypertension      Past Surgical History:  Procedure Laterality Date  . I&D EXTREMITY Right 08/09/2013   Procedure: MINOR IRRIGATION AND DEBRIDEMENT EXTREMITY;  Surgeon: Cammie Sickle, MD;  Location: [facility];  Service: Orthopedics;  Laterality: Right;  long       wound class 4    ROS  Objective:   Vitals: BP 109/77 (BP Location: Left Arm)   Pulse 89   Temp 98.5 F (36.9 C) (Oral)   Resp 18   SpO2 100%   Physical Exam Exam conducted with a chaperone present Oncologist).  Constitutional:      General: She is not in acute distress.    Appearance: Normal appearance. She is well-developed. She is not ill-appearing.  HENT:     Head: Normocephalic and atraumatic.     Nose: Nose normal.     Mouth/Throat:     Mouth: Mucous membranes are moist.     Pharynx: Oropharynx is clear.  Eyes:     General: No scleral icterus.    Extraocular Movements: Extraocular movements intact.     Pupils: Pupils are equal, round, and reactive to light.  Cardiovascular:     Rate and Rhythm: Normal rate.  Pulmonary:     Effort: Pulmonary effort is normal.  Chest:     Breasts:        Right: Tenderness (Over area depicted) present. No swelling, bleeding, inverted nipple, mass, nipple discharge or skin change.        Left: No swelling, bleeding, inverted nipple, mass, nipple discharge, skin change or tenderness.    Lymphadenopathy:     Upper Body:     Right upper body: No supraclavicular, axillary or pectoral adenopathy.     Left upper body:  No supraclavicular, axillary or pectoral adenopathy.  Skin:    General: Skin is warm and dry.  Neurological:     General: No focal deficit present.     Mental Status: She is alert and oriented to person, place, and time.  Psychiatric:        Mood and Affect: Mood normal.        Behavior: Behavior normal.     Assessment and Plan :   1. Breast pain, right   2. Uncontrolled type 2 diabetes mellitus with hyperglycemia ([place])   3. Essential hypertension   4. Decreased GFR     Discussed differential with patient.  Last mammogram was on 07/25/2018 and was only significant for scattered areas of fibroglandular density.  Counseled that this may be the likely source of her symptoms but it is also possible that she may be having pre-herpetic  neuralgia.  Due to her uncontrolled diabetes and decreased GFR, we will be using prednisone at 20 mg once daily.  Recommended patient monitor her carbohydrates very closely and continue checking her blood sugar in light of needing to use prednisone.  Also recommend she use Tylenol in addition for pain control.  Follow-up with PCP to consider imaging including ultrasound of right breast. Counseled patient on potential for adverse effects with medications prescribed/recommended today, ER and return-to-clinic precautions discussed, patient verbalized understanding.    Jaynee Eagles, PA-C 10/14/18 1007

## 2018-10-13 NOTE — Discharge Instructions (Signed)
Since I will be starting you on prednisone to help with pain and inflammation he have to monitor your carbohydrates very closely for your diabetes.  This includes limiting rice, potatoes, breads, pastas, pizza, sodas, sweet tea, starchy fruits such as bananas and grapes, etc. If you develop a painful blisterlike rash over your right breast, you can go ahead and fill the medication Valtrex to address this as a shingles rash.  Make sure that you follow-up with your primary care doctor on your decreased kidney function.

## 2018-10-14 ENCOUNTER — Encounter (HOSPITAL_COMMUNITY): Payer: Self-pay | Admitting: Urgent Care

## 2018-10-17 ENCOUNTER — Ambulatory Visit: Payer: BC Managed Care – PPO | Admitting: Family Medicine

## 2018-10-17 ENCOUNTER — Encounter (HOSPITAL_COMMUNITY): Payer: Self-pay | Admitting: Emergency Medicine

## 2018-10-17 ENCOUNTER — Other Ambulatory Visit: Payer: Self-pay

## 2018-10-17 ENCOUNTER — Emergency Department (HOSPITAL_COMMUNITY)
Admission: EM | Admit: 2018-10-17 | Discharge: 2018-10-17 | Disposition: A | Discharge status: Home / Self Care | Payer: Medicare Other | Attending: Emergency Medicine | Admitting: Emergency Medicine

## 2018-10-17 DIAGNOSIS — Z7984 Long term (current) use of oral hypoglycemic drugs: Secondary | ICD-10-CM | POA: Insufficient documentation

## 2018-10-17 DIAGNOSIS — Z79899 Other long term (current) drug therapy: Secondary | ICD-10-CM | POA: Diagnosis not present

## 2018-10-17 DIAGNOSIS — Z9114 Patient's other noncompliance with medication regimen: Secondary | ICD-10-CM | POA: Diagnosis not present

## 2018-10-17 DIAGNOSIS — Z7982 Long term (current) use of aspirin: Secondary | ICD-10-CM | POA: Diagnosis not present

## 2018-10-17 DIAGNOSIS — E119 Type 2 diabetes mellitus without complications: Secondary | ICD-10-CM | POA: Insufficient documentation

## 2018-10-17 DIAGNOSIS — Z9119 Patient's noncompliance with other medical treatment and regimen: Secondary | ICD-10-CM

## 2018-10-17 DIAGNOSIS — N644 Mastodynia: Secondary | ICD-10-CM | POA: Diagnosis not present

## 2018-10-17 DIAGNOSIS — Z91199 Patient's noncompliance with other medical treatment and regimen due to unspecified reason: Secondary | ICD-10-CM

## 2018-10-17 DIAGNOSIS — I10 Essential (primary) hypertension: Secondary | ICD-10-CM | POA: Diagnosis not present

## 2018-10-17 LAB — BASIC METABOLIC PANEL
Anion gap: 10 (ref 5–15)
BUN: 23 mg/dL (ref 8–23)
CO2: 22 mmol/L (ref 22–32)
Calcium: 10 mg/dL (ref 8.9–10.3)
Chloride: 104 mmol/L (ref 98–111)
Creatinine, Ser: 1.01 mg/dL — ABNORMAL HIGH (ref 0.44–1.00)
GFR calc Af Amer: >60 mL/min (ref >60)
GFR calc non Af Amer: 57 mL/min — ABNORMAL LOW (ref >60)
Glucose, Bld: 260 mg/dL — ABNORMAL HIGH (ref 70–99)
Potassium: 4.7 mmol/L (ref 3.5–5.1)
Sodium: 136 mmol/L (ref 135–145)

## 2018-10-17 LAB — CBC
HCT: 43.4 % (ref 36.0–46.0)
Hemoglobin: 14 g/dL (ref 12.0–15.0)
MCH: 30.9 pg (ref 26.0–34.0)
MCHC: 32.3 g/dL (ref 30.0–36.0)
MCV: 95.8 fL (ref 80.0–100.0)
Platelets: 307 10*3/uL (ref 150–400)
RBC: 4.53 MIL/uL (ref 3.87–5.11)
RDW: 13.9 % (ref 11.5–15.5)
WBC: 6.5 10*3/uL (ref 4.0–10.5)
nRBC: 0 % (ref 0.0–0.2)

## 2018-10-17 LAB — URINALYSIS, ROUTINE W REFLEX MICROSCOPIC
Bilirubin Urine: NEGATIVE
Glucose, UA: >=500 mg/dL — AB
Hgb urine dipstick: NEGATIVE
Ketones, ur: NEGATIVE mg/dL
Leukocytes,Ua: NEGATIVE
Nitrite: NEGATIVE
Protein, ur: NEGATIVE mg/dL
Specific Gravity, Urine: 1.008 (ref 1.005–1.030)
pH: 6 (ref 5.0–8.0)

## 2018-10-17 NOTE — Discharge Instructions (Addendum)
Today your blood sugar was high.  In addition to taking your other diabetes medicine please increase your metformin dosing from once a day to twice a day.  It is important that you keep your follow-up appointment with your primary care doctor. Your kidney function today is essentially normal and is the best it has been since 2015.

## 2018-10-17 NOTE — ED Triage Notes (Signed)
Pt reports she was told she had possible kidney failure and needed to get evaluated by a doctor.

## 2018-10-17 NOTE — ED Provider Notes (Signed)
[facility] EMERGENCY DEPARTMENT Provider Note   CSN: [id number] Arrival date & time: 10/17/18  1033    History   Chief Complaint Chief Complaint  Patient presents with  . Possible Kidney Failure    HPI Kristi Campos is a 68 y.o. female with a past medical history of diabetes, hypertension presents today stating that she is concerned that she may have renal failure.  She had come into urgent care on 8/14 for right-sided breast pain, according to their note she was unaware that her GFR from her labs on 09/19/2017 showed a GFR of 28.  Patient reports that they sent her here.  Note does not mention specific need for ER follow-up due to concern for renal failure.  Patient states that her right breast pain is unchanged.  She denies any rashes.  She states that she has been taking her metformin as half a pill once a day instead of 2 pills twice a day.  She is also taking Jardiance.  She reports that she has chronic, unchanged right-sided back pain.  She previously has not had a primary care doctor, however now has an appointment with 1 and is awaiting that appointment.  She denies any fevers or chest pain.  No cough or shortness of breath.     HPI  Past Medical History:  Diagnosis Date  . Diabetes mellitus without complication ([place])   . Fibroid uterus   . Hypertension     There are no active problems to display for this patient.   Past Surgical History:  Procedure Laterality Date  . I&D EXTREMITY Right 08/09/2013   Procedure: MINOR IRRIGATION AND DEBRIDEMENT EXTREMITY;  Surgeon: Cammie Sickle, MD;  Location: [facility];  Service: Orthopedics;  Laterality: Right;  long       wound class 4     OB History    Gravida  0   Para  0   Term  0   Preterm  0   AB  0   Living  0     SAB  0   TAB  0   Ectopic  0   Multiple  0   Live Births               Home Medications    Prior to Admission medications   Medication Sig Start  Date End Date Taking? Authorizing Provider  aspirin EC 81 MG tablet Take 81 mg by mouth daily.    [provider]  cetirizine (ZYRTEC) 10 MG tablet Take 1 tablet (10 mg total) by mouth 2 (two) times daily. 03/21/18   Raylene Everts, MD  losartan-hydrochlorothiazide (HYZAAR) 100-12.5 MG per tablet Take 1 tablet by mouth daily.  10/08/14   [provider]  metFORMIN (GLUCOPHAGE) 500 MG tablet Take 500 mg by mouth 2 (two) times daily with a meal.    [provider]  methocarbamol (ROBAXIN) 500 MG tablet Take 1 tablet (500 mg total) by mouth 2 (two) times daily. 10/06/18   Wieters, Hallie C, PA-C  polyethylene glycol (MIRALAX / GLYCOLAX) packet Take 17 g by mouth 2 (two) times daily. Until stooling regularly 07/03/13   Hilton Sinclair, MD  predniSONE (DELTASONE) 20 MG tablet Take 2 tablets daily with breakfast. 10/13/18   Jaynee Eagles, PA-C  valACYclovir (VALTREX) 1000 MG tablet Take 1 tablet (1,000 mg total) by mouth daily for 10 days. 10/13/18 10/23/18  Jaynee Eagles, PA-C    Family History Family History  Family history unknown: Yes    Social History Social History   Tobacco Use  . Smoking status: Never Smoker  . Smokeless tobacco: Never Used  Substance Use Topics  . Alcohol use: No    Alcohol/week: 0.0 standard drinks  . Drug use: No     Allergies   Patient has no known allergies.   Review of Systems Review of Systems  Constitutional: Negative for chills and fever.  HENT: Negative for congestion.   Respiratory: Negative for chest tightness and shortness of breath.   Cardiovascular: Negative for chest pain, palpitations and leg swelling.  Gastrointestinal: Negative for abdominal pain, diarrhea, nausea and vomiting.  Genitourinary: Negative for dysuria, flank pain, frequency and urgency.  Musculoskeletal: Positive for back pain (Chronic, unchanged over many years.).  Neurological: Negative for weakness and headaches.  Psychiatric/Behavioral: Negative  for confusion.  All other systems reviewed and are negative.    Physical Exam Updated Vital Signs BP 129/77 (BP Location: Left Arm)   Pulse 80   Temp 98.4 F (36.9 C) (Oral)   Resp 16   Ht 5\' 3"  (1.6 m)   Wt 86.6 kg   SpO2 99%   BMI 33.83 kg/m   Physical Exam Vitals signs and nursing note reviewed.  Constitutional:      General: She is not in acute distress.    Appearance: She is well-developed. She is not diaphoretic.  HENT:     Head: Normocephalic and atraumatic.  Eyes:     General: No scleral icterus.       Right eye: No discharge.        Left eye: No discharge.     Conjunctiva/sclera: Conjunctivae normal.  Neck:     Musculoskeletal: Normal range of motion.  Cardiovascular:     Rate and Rhythm: Normal rate and regular rhythm.  Pulmonary:     Effort: Pulmonary effort is normal. No respiratory distress.     Breath sounds: No stridor.  Chest:     Comments: Visual exam only of the lateral right breast/ chest wall was performed without visualization of the nipple or palpation.  There is no rash or abnormal redness. Abdominal:     General: There is no distension.  Musculoskeletal:        General: No deformity.  Skin:    General: Skin is warm and dry.  Neurological:     Mental Status: She is alert.     Motor: No abnormal muscle tone.  Psychiatric:        Behavior: Behavior normal.      ED Treatments / Results  Labs (all labs ordered are listed, but only abnormal results are displayed) Labs Reviewed  URINALYSIS, ROUTINE W REFLEX MICROSCOPIC - Abnormal; Notable for the following components:      Result Value   Color, Urine STRAW (*)    Glucose, UA >=500 (*)    Bacteria, UA FEW (*)    All other components within normal limits  BASIC METABOLIC PANEL - Abnormal; Notable for the following components:   Glucose, Bld 260 (*)    Creatinine, Ser 1.01 (*)    GFR calc non Af Amer 57 (*)    All other components within normal limits  CBC    EKG None  Radiology  No results found.  Procedures Procedures (including critical care time)  Medications Ordered in ED Medications - No data to display   Initial Impression / Assessment and Plan / ED Course  I have reviewed the triage vital signs  and the nursing notes.  Pertinent labs & imaging results that were available during my care of the patient were reviewed by me and considered in my medical decision making (see chart for details).       Patient presents today concerned that she may be in renal failure.  She was recently informed that in July of last year her GFR was 28 so she is concern for renal failure.  Here today her creatinine is 1.04 GFR of over 60.  She does not have evidence of renal failure and in fact this is the best her creatinine and GFR have been since 2015.  Her urine has over 500 glucose.  She denies any dysuria, increased frequency or urgency.  We discussed that her blood sugar is not controlled.  She is only taking half a pill of metformin once a day as taking a full pill twice a day was giving her diarrhea, I recommended that she increase this to taking half a pill twice a day.  We also discussed the need for dietary compliance.  Her urine does not have evidence of ketones and she denies any specific symptoms today therefore do not suspect her Hyperglycemia requires emergent treatment.    There was concern in the urgent care note for her pain on her right breast being due to pre-herpetic neuralgia, visual exam of right lateral chest wall did not show any lesions that would be consistent with herpes zoster.  Stressed the importance of primary care follow-up.  Also discussed medication changes and that her diabetes does not appear fully controlled at this time.  Given that she does not have any symptoms or significant electrolyte derangements I recommended compliance with her metformin at home.  Return precautions were discussed with patient who states their understanding.  At the time  of discharge patient denied any unaddressed complaints or concerns.  Patient is agreeable for discharge home.    Final Clinical Impressions(s) / ED Diagnoses   Final diagnoses:  Type 2 diabetes mellitus without complication, without long-term current use of insulin ([place])  Personal history of noncompliance with medical treatment    ED Discharge Orders    None       Ollen Gross 10/17/18 1626    Veryl Speak, MD 10/18/18 1304

## 2018-10-23 ENCOUNTER — Other Ambulatory Visit: Payer: Self-pay

## 2018-10-24 ENCOUNTER — Other Ambulatory Visit: Payer: Self-pay | Admitting: Family Medicine

## 2018-10-24 ENCOUNTER — Encounter: Payer: Self-pay | Admitting: Family Medicine

## 2018-10-24 ENCOUNTER — Ambulatory Visit (INDEPENDENT_AMBULATORY_CARE_PROVIDER_SITE_OTHER): Payer: Medicare Other | Admitting: Family Medicine

## 2018-10-24 VITALS — BP 118/68 | HR 97 | Temp 97.7°F | Ht 63.0 in | Wt 188.2 lb

## 2018-10-24 DIAGNOSIS — E669 Obesity, unspecified: Secondary | ICD-10-CM | POA: Diagnosis not present

## 2018-10-24 DIAGNOSIS — G8929 Other chronic pain: Secondary | ICD-10-CM

## 2018-10-24 DIAGNOSIS — Z23 Encounter for immunization: Secondary | ICD-10-CM | POA: Diagnosis not present

## 2018-10-24 DIAGNOSIS — E1169 Type 2 diabetes mellitus with other specified complication: Secondary | ICD-10-CM | POA: Diagnosis not present

## 2018-10-24 DIAGNOSIS — R739 Hyperglycemia, unspecified: Secondary | ICD-10-CM

## 2018-10-24 DIAGNOSIS — M545 Low back pain, unspecified: Secondary | ICD-10-CM

## 2018-10-24 LAB — MICROALBUMIN / CREATININE URINE RATIO
Creatinine,U: 67.4 mg/dL
Microalb Creat Ratio: 1 mg/g (ref 0.0–30.0)
Microalb, Ur: <0.7 mg/dL (ref 0.0–1.9)

## 2018-10-24 LAB — HEMOGLOBIN A1C: Hgb A1c MFr Bld: 9.9 % — ABNORMAL HIGH (ref 4.6–6.5)

## 2018-10-24 MED ORDER — METFORMIN HCL 500 MG PO TABS: ORAL_TABLET | ORAL | 3 refills | Status: DC

## 2018-10-24 MED ORDER — PIOGLITAZONE HCL 30 MG PO TABS: 30.0000 mg | ORAL_TABLET | Freq: Every day | ORAL | 1 refills | Status: DC

## 2018-10-24 NOTE — Patient Instructions (Signed)
Heat (pad or rice pillow in microwave) over affected area, 10-15 minutes twice daily.   OK to take Tylenol 1000 mg (2 extra strength tabs) or 975 mg (3 regular strength tabs) every 6 hours as needed.  If you do not hear anything about your referral in the next 1-2 weeks, call our office and ask for an update.  Keep the diet clean and stay active.  Aim to do some physical exertion for 150 minutes per week. This is typically divided into 5 days per week, 30 minutes per day. The activity should be enough to get your heart rate up. Anything is better than nothing if you have time constraints.  EXERCISES  RANGE OF MOTION (ROM) AND STRETCHING EXERCISES - Low Back Pain Most people with lower back pain will find that their symptoms get worse with excessive bending forward (flexion) or arching at the lower back (extension). The exercises that will help resolve your symptoms will focus on the opposite motion.  If you have pain, numbness or tingling which travels down into your buttocks, leg or foot, the goal of the therapy is for these symptoms to move closer to your back and eventually resolve. Sometimes, these leg symptoms will get better, but your lower back pain may worsen. This is often an indication of progress in your rehabilitation. Be very alert to any changes in your symptoms and the activities in which you participated in the 24 hours prior to the change. Sharing this information with your caregiver will allow him or her to most efficiently treat your condition. These exercises may help you when beginning to rehabilitate your injury. Your symptoms may resolve with or without further involvement from your physician, physical therapist or athletic trainer. While completing these exercises, remember:   Restoring tissue flexibility helps normal motion to return to the joints. This allows healthier, less painful movement and activity.  An effective stretch should be held for at least 30 seconds.  A  stretch should never be painful. You should only feel a gentle lengthening or release in the stretched tissue. FLEXION RANGE OF MOTION AND STRETCHING EXERCISES:  STRETCH - Flexion, Single Knee to Chest   Lie on a firm bed or floor with both legs extended in front of you.  Keeping one leg in contact with the floor, bring your opposite knee to your chest. Hold your leg in place by either grabbing behind your thigh or at your knee.  Pull until you feel a gentle stretch in your low back. Hold 30 seconds.  Slowly release your grasp and repeat the exercise with the opposite side. Repeat 2 times. Complete this exercise 3 times per week.   STRETCH - Flexion, Double Knee to Chest  Lie on a firm bed or floor with both legs extended in front of you.  Keeping one leg in contact with the floor, bring your opposite knee to your chest.  Tense your stomach muscles to support your back and then lift your other knee to your chest. Hold your legs in place by either grabbing behind your thighs or at your knees.  Pull both knees toward your chest until you feel a gentle stretch in your low back. Hold 30 seconds.  Tense your stomach muscles and slowly return one leg at a time to the floor. Repeat 2 times. Complete this exercise 3 times per week.   STRETCH - Low Trunk Rotation  Lie on a firm bed or floor. Keeping your legs in front of you, bend your  knees so they are both pointed toward the ceiling and your feet are flat on the floor.  Extend your arms out to the side. This will stabilize your upper body by keeping your shoulders in contact with the floor.  Gently and slowly drop both knees together to one side until you feel a gentle stretch in your low back. Hold for 30 seconds.  Tense your stomach muscles to support your lower back as you bring your knees back to the starting position. Repeat the exercise to the other side. Repeat 2 times. Complete this exercise at least 3 times per week.    EXTENSION RANGE OF MOTION AND FLEXIBILITY EXERCISES:  STRETCH - Extension, Prone on Elbows   Lie on your stomach on the floor, a bed will be too soft. Place your palms about shoulder width apart and at the height of your head.  Place your elbows under your shoulders. If this is too painful, stack pillows under your chest.  Allow your body to relax so that your hips drop lower and make contact more completely with the floor.  Hold this position for 30 seconds.  Slowly return to lying flat on the floor. Repeat 2 times. Complete this exercise 3 times per week.   RANGE OF MOTION - Extension, Prone Press Ups  Lie on your stomach on the floor, a bed will be too soft. Place your palms about shoulder width apart and at the height of your head.  Keeping your back as relaxed as possible, slowly straighten your elbows while keeping your hips on the floor. You may adjust the placement of your hands to maximize your comfort. As you gain motion, your hands will come more underneath your shoulders.  Hold this position 30 seconds.  Slowly return to lying flat on the floor. Repeat 2 times. Complete this exercise 3 times per week.   RANGE OF MOTION- Quadruped, Neutral Spine   Assume a hands and knees position on a firm surface. Keep your hands under your shoulders and your knees under your hips. You may place padding under your knees for comfort.  Drop your head and point your tailbone toward the ground below you. This will round out your lower back like an angry cat. Hold this position for 30 seconds.  Slowly lift your head and release your tail bone so that your back sags into a large arch, like an old horse.  Hold this position for 30 seconds.  Repeat this until you feel limber in your low back.  Now, find your "sweet spot." This will be the most comfortable position somewhere between the two previous positions. This is your neutral spine. Once you have found this position, tense your stomach  muscles to support your low back.  Hold this position for 30 seconds. Repeat 2 times. Complete this exercise 3 times per week.   STRENGTHENING EXERCISES - Low Back Sprain These exercises may help you when beginning to rehabilitate your injury. These exercises should be done near your "sweet spot." This is the neutral, low-back arch, somewhere between fully rounded and fully arched, that is your least painful position. When performed in this safe range of motion, these exercises can be used for people who have either a flexion or extension based injury. These exercises may resolve your symptoms with or without further involvement from your physician, physical therapist or athletic trainer. While completing these exercises, remember:   Muscles can gain both the endurance and the strength needed for everyday activities through controlled  exercises.  Complete these exercises as instructed by your physician, physical therapist or athletic trainer. Increase the resistance and repetitions only as guided.  You may experience muscle soreness or fatigue, but the pain or discomfort you are trying to eliminate should never worsen during these exercises. If this pain does worsen, stop and make certain you are following the directions exactly. If the pain is still present after adjustments, discontinue the exercise until you can discuss the trouble with your caregiver.  STRENGTHENING - Deep Abdominals, Pelvic Tilt   Lie on a firm bed or floor. Keeping your legs in front of you, bend your knees so they are both pointed toward the ceiling and your feet are flat on the floor.  Tense your lower abdominal muscles to press your low back into the floor. This motion will rotate your pelvis so that your tail bone is scooping upwards rather than pointing at your feet or into the floor. With a gentle tension and even breathing, hold this position for 3 seconds. Repeat 2 times. Complete this exercise 3 times per week.    STRENGTHENING - Abdominals, Crunches   Lie on a firm bed or floor. Keeping your legs in front of you, bend your knees so they are both pointed toward the ceiling and your feet are flat on the floor. Cross your arms over your chest.  Slightly tip your chin down without bending your neck.  Tense your abdominals and slowly lift your trunk high enough to just clear your shoulder blades. Lifting higher can put excessive stress on the lower back and does not further strengthen your abdominal muscles.  Control your return to the starting position. Repeat 2 times. Complete this exercise 3 times per week.   STRENGTHENING - Quadruped, Opposite UE/LE Lift   Assume a hands and knees position on a firm surface. Keep your hands under your shoulders and your knees under your hips. You may place padding under your knees for comfort.  Find your neutral spine and gently tense your abdominal muscles so that you can maintain this position. Your shoulders and hips should form a rectangle that is parallel with the floor and is not twisted.  Keeping your trunk steady, lift your right hand no higher than your shoulder and then your left leg no higher than your hip. Make sure you are not holding your breath. Hold this position for 30 seconds.  Continuing to keep your abdominal muscles tense and your back steady, slowly return to your starting position. Repeat with the opposite arm and leg. Repeat 2 times. Complete this exercise 3 times per week.   STRENGTHENING - Abdominals and Quadriceps, Straight Leg Raise   Lie on a firm bed or floor with both legs extended in front of you.  Keeping one leg in contact with the floor, bend the other knee so that your foot can rest flat on the floor.  Find your neutral spine, and tense your abdominal muscles to maintain your spinal position throughout the exercise.  Slowly lift your straight leg off the floor about 6 inches for a count of 3, making sure to not hold your  breath.  Still keeping your neutral spine, slowly lower your leg all the way to the floor. Repeat this exercise with each leg 2 times. Complete this exercise 3 times per week.  POSTURE AND BODY MECHANICS CONSIDERATIONS - Low Back Sprain Keeping correct posture when sitting, standing or completing your activities will reduce the stress put on different body tissues, allowing  injured tissues a chance to heal and limiting painful experiences. The following are general guidelines for improved posture.  While reading these guidelines, remember:  The exercises prescribed by your provider will help you have the flexibility and strength to maintain correct postures.  The correct posture provides the best environment for your joints to work. All of your joints have less wear and tear when properly supported by a spine with good posture. This means you will experience a healthier, less painful body.  Correct posture must be practiced with all of your activities, especially prolonged sitting and standing. Correct posture is as important when doing repetitive low-stress activities (typing) as it is when doing a single heavy-load activity (lifting).  RESTING POSITIONS Consider which positions are most painful for you when choosing a resting position. If you have pain with flexion-based activities (sitting, bending, stooping, squatting), choose a position that allows you to rest in a less flexed posture. You would want to avoid curling into a fetal position on your side. If your pain worsens with extension-based activities (prolonged standing, working overhead), avoid resting in an extended position such as sleeping on your stomach. Most people will find more comfort when they rest with their spine in a more neutral position, neither too rounded nor too arched. Lying on a non-sagging bed on your side with a pillow between your knees, or on your back with a pillow under your knees will often provide some relief.  Keep in mind, being in any one position for a prolonged period of time, no matter how correct your posture, can still lead to stiffness.  PROPER SITTING POSTURE In order to minimize stress and discomfort on your spine, you must sit with correct posture. Sitting with good posture should be effortless for a healthy body. Returning to good posture is a gradual process. Many people can work toward this most comfortably by using various supports until they have the flexibility and strength to maintain this posture on their own. When sitting with proper posture, your ears will fall over your shoulders and your shoulders will fall over your hips. You should use the back of the chair to support your upper back. Your lower back will be in a neutral position, just slightly arched. You may place a small pillow or folded towel at the base of your lower back for  support.  When working at a desk, create an environment that supports good, upright posture. Without extra support, muscles tire, which leads to excessive strain on joints and other tissues. Keep these recommendations in mind:  CHAIR:  A chair should be able to slide under your desk when your back makes contact with the back of the chair. This allows you to work closely.  The chair's height should allow your eyes to be level with the upper part of your monitor and your hands to be slightly lower than your elbows.  BODY POSITION  Your feet should make contact with the floor. If this is not possible, use a foot rest.  Keep your ears over your shoulders. This will reduce stress on your neck and low back.  INCORRECT SITTING POSTURES  If you are feeling tired and unable to assume a healthy sitting posture, do not slouch or slump. This puts excessive strain on your back tissues, causing more damage and pain. Healthier options include:  Using more support, like a lumbar pillow.  Switching tasks to something that requires you to be upright or walking.   Talking a brief walk.  Lying down to rest in a neutral-spine position.  PROLONGED STANDING WHILE SLIGHTLY LEANING FORWARD  When completing a task that requires you to lean forward while standing in one place for a long time, place either foot up on a stationary 2-4 inch high object to help maintain the best posture. When both feet are on the ground, the lower back tends to lose its slight inward curve. If this curve flattens (or becomes too large), then the back and your other joints will experience too much stress, tire more quickly, and can cause pain.  CORRECT STANDING POSTURES Proper standing posture should be assumed with all daily activities, even if they only take a few moments, like when brushing your teeth. As in sitting, your ears should fall over your shoulders and your shoulders should fall over your hips. You should keep a slight tension in your abdominal muscles to brace your spine. Your tailbone should point down to the ground, not behind your body, resulting in an over-extended swayback posture.   INCORRECT STANDING POSTURES  Common incorrect standing postures include a forward head, locked knees and/or an excessive swayback. WALKING Walk with an upright posture. Your ears, shoulders and hips should all line-up.  PROLONGED ACTIVITY IN A FLEXED POSITION When completing a task that requires you to bend forward at your waist or lean over a low surface, try to find a way to stabilize 3 out of 4 of your limbs. You can place a hand or elbow on your thigh or rest a knee on the surface you are reaching across. This will provide you more stability, so that your muscles do not tire as quickly. By keeping your knees relaxed, or slightly bent, you will also reduce stress across your lower back. CORRECT LIFTING TECHNIQUES  DO :  Assume a wide stance. This will provide you more stability and the opportunity to get as close as possible to the object which you are lifting.  Tense your  abdominals to brace your spine. Bend at the knees and hips. Keeping your back locked in a neutral-spine position, lift using your leg muscles. Lift with your legs, keeping your back straight.  Test the weight of unknown objects before attempting to lift them.  Try to keep your elbows locked down at your sides in order get the best strength from your shoulders when carrying an object.     Always ask for help when lifting heavy or awkward objects. INCORRECT LIFTING TECHNIQUES DO NOT:   Lock your knees when lifting, even if it is a small object.  Bend and twist. Pivot at your feet or move your feet when needing to change directions.  Assume that you can safely pick up even a paperclip without proper posture.

## 2018-10-24 NOTE — Progress Notes (Signed)
Chief Complaint  Patient presents with  . New Patient (Initial Visit)  . Flank Pain       New Patient Visit SUBJECTIVE: HPI: Kristi Campos is an 68 y.o.female who is being seen for establishing care.  The patient was previously seen at Dr Avebuere's office.  4-5 yrs of lower R back pain. Hurts when she walks. Sharp kind of pain, rates it as a 6/10 when she walks. Other than walking, sitting too long with cause her pain. No radiation. BC's powder helps. Has not tried anything else. No neurologic s/s's, bruising, no bowel/bladder incontinence.   DM Sugars range from mid 100's-200's. Diet is fair, does not exercise. Does not have low readings. Last eye exam was around 1 year ago. Taking Metformin 500 mg twice daily. Not on statin. She is not having any vision issues, CP or SOB. No skin problems or unexpected wt changes.  She has never had a pneumonia vaccine.  No Known Allergies  Past Medical History:  Diagnosis Date  . Diabetes mellitus without complication ([place])   . Fibroid uterus   . Hypertension    Past Surgical History:  Procedure Laterality Date  . I&D EXTREMITY Right 08/09/2013   Procedure: MINOR IRRIGATION AND DEBRIDEMENT EXTREMITY;  Surgeon: Cammie Sickle, MD;  Location: [facility];  Service: Orthopedics;  Laterality: Right;  long       wound class 4   Family History  Family history unknown: Yes   No Known Allergies  Current Outpatient Medications:  .  aspirin EC 81 MG tablet, Take 81 mg by mouth daily., Disp: , Rfl:  .  losartan-hydrochlorothiazide (HYZAAR) 100-12.5 MG per tablet, Take 1 tablet by mouth daily. , Disp: , Rfl:  .  metFORMIN (GLUCOPHAGE) 500 MG tablet, Take 500 mg by mouth 2 (two) times daily with a meal., Disp: , Rfl:  .  polyethylene glycol (MIRALAX / GLYCOLAX) packet, Take 17 g by mouth 2 (two) times daily. Until stooling regularly, Disp: 30 packet, Rfl: 0  No LMP recorded. Patient is postmenopausal.  ROS CV: No Cp Lungs: No  SOB MSK: +back/side pain Neuro: No weakness Skin: No erythema Heme: No bruising GU: No urinary incontinence GI: No bowel incontinence Eyes: No vision changes Endo: No weight changes  OBJECTIVE: BP 118/68 (BP Location: Left Arm, Patient Position: Sitting, Cuff Size: Normal)   Pulse 97   Temp 97.7 F (36.5 C) (Temporal)   Ht 5\' 3"  (1.6 m)   Wt 188 lb 4 oz (85.4 kg)   SpO2 100%   BMI 33.35 kg/m   Constitutional: -  VS reviewed -  Well developed, well nourished, appears stated age -  No apparent distress  Psychiatric: -  Oriented to person, place, and time -  Memory intact -  Affect and mood normal -  Fluent conversation, good eye contact -  Judgment and insight age appropriate  Eye: -  Conjunctivae clear, no discharge -  Pupils symmetric, round, reactive to light  ENMT: -  MMM    Pharynx moist, no exudate, no erythema  Neck: -  No gross swelling, no palpable masses -  Thyroid midline, not enlarged, mobile, no palpable masses  Cardiovascular: -  RRR -  No bruits -  No LE edema  Respiratory: -  Normal respiratory effort, no accessory muscle use, no retraction -  Breath sounds equal, no wheezes, no ronchi, no crackles  Gastrointestinal: -  Bowel sounds normal -  No tenderness, no distention, no guarding, no  masses  Neurological:  -  CN II - XII grossly intact -  Sensation intact to pinprick, equal bilaterally  Skin: -  No significant lesion on inspection -  Warm and dry to palpation   ASSESSMENT/PLAN: Chronic right-sided low back pain without sciatica - Plan: Ambulatory referral to Physical Therapy; heat, Tylenol, stretches and exercises also provided.  Diabetes mellitus type 2 in obese ([place]) - Plan: Hemoglobin A1c, Microalbumin / creatinine urine ratio; foot exam today, update immunizations.  Need for vaccination against Streptococcus pneumoniae - Plan: Pneumococcal polysaccharide vaccine 23-valent greater than or equal to 2yo subcutaneous/IM  Patient instructed to  sign release of records form from her previous PCP. Patient should return pending the results of her A1c. The patient voiced understanding and agreement to the plan.   [place], DO 10/24/18  11:53 AM

## 2018-11-07 ENCOUNTER — Other Ambulatory Visit: Payer: Self-pay

## 2018-11-07 ENCOUNTER — Ambulatory Visit: Payer: Medicare Other | Attending: Family Medicine | Admitting: Physical Therapy

## 2018-11-07 ENCOUNTER — Encounter: Payer: Self-pay | Admitting: Physical Therapy

## 2018-11-07 DIAGNOSIS — M6281 Muscle weakness (generalized): Secondary | ICD-10-CM

## 2018-11-07 DIAGNOSIS — R262 Difficulty in walking, not elsewhere classified: Secondary | ICD-10-CM | POA: Insufficient documentation

## 2018-11-07 DIAGNOSIS — G8929 Other chronic pain: Secondary | ICD-10-CM

## 2018-11-07 DIAGNOSIS — M545 Low back pain: Secondary | ICD-10-CM | POA: Diagnosis present

## 2018-11-07 NOTE — Therapy (Signed)
[place], [place], [address] Phone: [phone]   Fax:  [phone]  Physical Therapy Evaluation  Patient Details  Name: Kristi Campos MRN: [id number] Date of Birth: [birth year] Referring Provider (PT): Shelda Pal, [place]   Encounter Date: 11/07/2018  PT End of Session - 11/07/18 0936    Visit Number  1    Number of Visits  12    Date for PT Re-Evaluation  01/07/19    Authorization Type  MDC, submitted for inital 3 visits.    PT Start Time  [id number]    PT Stop Time  1015    PT Time Calculation (min)  50 min    Activity Tolerance  Patient tolerated treatment well    Behavior During Therapy  WFL for tasks assessed/performed       Past Medical History:  Diagnosis Date  . Diabetes mellitus without complication ([place])   . Fibroid uterus   . Hypertension     Past Surgical History:  Procedure Laterality Date  . I&D EXTREMITY Right 08/09/2013   Procedure: MINOR IRRIGATION AND DEBRIDEMENT EXTREMITY;  Surgeon: Cammie Sickle, MD;  Location: [facility] [facility];  Service: Orthopedics;  Laterality: Right;  long       wound class 4    There were no vitals filed for this visit.   Subjective Assessment - 11/07/18 0929    Subjective  Pt arriving to therapy for PT evaluation with chronic back pain that has been doing on for 4-5 years. Pt reporting her pain limits her ADL's and community activities. Pt reporting 3/10 at rest. Pt reporting her pain can increase to a 10/10 at times. Standing prolonged or doing household chores pt reports needing several rest breaks and reporting increased pain that can last for day.    Pertinent History  HTN, DM    Limitations  Sitting;House hold activities;Standing;Walking    How long can you sit comfortably?  20 minutes    How long can you stand comfortably?  20 minutes    How long can you walk comfortably?  30 minutes    Patient Stated Goals  Stop hurting     Currently in Pain?  Yes    Pain Score  3     Pain Location  Back    Pain Orientation  Right;Lower    Pain Descriptors / Indicators  Aching    Pain Type  Chronic pain    Pain Onset  More than a month ago    Pain Frequency  Intermittent    Aggravating Factors   standing prolonged, sitting prolonged, walking prolonged, household chores    Pain Relieving Factors  BC powder    Effect of Pain on Daily Activities  difficulty with household chores and ADL's    Multiple Pain Sites  No         [facility] PT Assessment - 11/07/18 0001      Assessment   Medical Diagnosis  R sided LBP    Referring Provider (PT)  Nani Ravens, Crosby Oyster, DO    Onset Date/Surgical Date  --   5 years ago   Hand Dominance  Right    Next MD Visit  follow up after therapy    Prior Therapy  yes      Precautions   Precautions  None      Restrictions   Weight Bearing Restrictions  No      Balance Screen   Has the patient fallen  in the past 6 months  --   almost falls and LOB reported   Is the patient reluctant to leave their home because of a fear of falling?   No      Home Film/video editor residence      Prior Function   Level of Independence  Independent      Cognition   Overall Cognitive Status  Within Functional Limits for tasks assessed      Observation/Other Assessments   Focus on Therapeutic Outcomes (FOTO)   61% limitation      Posture/Postural Control   Posture/Postural Control  Postural limitations    Postural Limitations  Rounded Shoulders;Forward head;Decreased lumbar lordosis;Increased thoracic kyphosis      ROM / Strength   AROM / PROM / Strength  AROM;Strength      AROM   Overall AROM   Deficits    AROM Assessment Site  Lumbar    Lumbar Flexion  50   pain reported   Lumbar Extension  12    Lumbar - Right Side Bend  10    Lumbar - Left Side Bend  10    Lumbar - Right Rotation  75% limited   pain reported   Lumbar - Left Rotation  75% limited   pain  reported     Strength   Strength Assessment Site  Hip;Knee    Right/Left Hip  Right;Left    Right Hip Flexion  3/5    Right Hip ABduction  3-/5    Right Hip ADduction  3-/5    Left Hip Flexion  3/5    Left Hip ABduction  3/5    Left Hip ADduction  3/5    Right/Left Knee  Right;Left    Right Knee Flexion  3/5    Right Knee Extension  3/5    Left Knee Flexion  4/5    Left Knee Extension  4/5      Palpation   Palpation comment  TTP on lumbar paraspinals on R side, R iliac creast      Special Tests    Special Tests  Lumbar    Lumbar Tests  Straight Leg Raise      Straight Leg Raise   Findings  Positive    Side   Right      Transfers   Five time sit to stand comments   25.3 seconds using UE support      Ambulation/Gait   Gait Comments  Pt amb with stragiht cane with shuffeling gait pattern with decreased foot clearance, decreased step length and decreased hip and knee flexion bilaterally.       Standardized Balance Assessment   Standardized Balance Assessment  Berg Balance Test      Berg Balance Test   Sit to Stand  Able to stand  independently using hands    Standing Unsupported  Able to stand safely 2 minutes    Sitting with Back Unsupported but Feet Supported on Floor or Stool  Able to sit safely and securely 2 minutes    Stand to Sit  Sits safely with minimal use of hands    Transfers  Able to transfer safely, definite need of hands    Standing Unsupported with Eyes Closed  Able to stand 3 seconds    Standing Unsupported with Feet Together  Able to place feet together independently and stand for 1 minute with supervision    From Standing, Reach Forward with Outstretched Arm  Can reach forward >12 cm safely (5")    From Standing Position, Pick up Object from Floor  Unable to pick up and needs supervision    From Standing Position, Turn to Look Behind Over each Shoulder  Turn sideways only but maintains balance    Turn 360 Degrees  Able to turn 360 degrees safely but  slowly    Standing Unsupported, Alternately Place Feet on Step/Stool  Needs assistance to keep from falling or unable to try    Standing Unsupported, One Foot in Front  Needs help to step but can hold 15 seconds    Standing on One Leg  Unable to try or needs assist to prevent fall    Total Score  32                Objective measurements completed on examination: See above findings.              PT Education - 11/07/18 0935    Education Details  posture, HEP    Person(s) Educated  Patient    Methods  Explanation;Demonstration;Handout    Comprehension  Verbalized understanding;Returned demonstration       PT Short Term Goals - 11/07/18 0937      PT SHORT TERM GOAL #1   Title  Pt will be independent in her HEP.    Baseline  inital HEP issued today    Time  3    Period  Weeks    Status  New    Target Date  11/28/18      PT SHORT TERM GOAL #2   Title  Pt will be able to perform 5 times sit to stand in </= 18 seconds.    Baseline  25.3 seconds using UE support on 11/07/2018    Time  3    Period  Weeks    Status  New    Target Date  11/28/18      PT SHORT TERM GOAL #3   Title  Pt will with able to amb 500 feet with  step through gait pattern  with straight cane.    Baseline  Pt presenting with shuffeling gait pattern with decreased foot clearance on9/09/2000    Time  3    Period  Weeks    Status  New        PT Long Term Goals - 11/07/18 [id number]      PT LONG TERM GOAL #1   Title  Pt will improve her LE hip strength to grossly >/= 4/5 to improve functional mobility.    Baseline  grossly 3/5 11/07/2018    Time  8    Period  Weeks    Status  New      PT LONG TERM GOAL #2   Title  Pt will improve her FOTO score to </=  47% limitation.    Baseline  61 % limitation 11/07/2018    Time  8    Period  Weeks    Status  New    Target Date  01/02/19      PT LONG TERM GOAL #3   Title  Pt will be able to amb 20 minutes for community level activities with pain </=  3/10.    Baseline  pt reporitng pain of 6-7/10 with walking prolonged or grocery shopping 11/07/2018    Time  8    Period  Weeks    Status  New  Plan - 11/07/18 0955    Clinical Impression Statement  Pt presenting with R sided chronic LBP. Pt with tenderness to palpation in R lumbar paraspinals and over R iliac crest. Pt with + SLR on the R. Pt with limited hamstring flexibility and weakness noted in bilateral LE's. Pt with pain of 3/10 at rest, but reporting pain can increased to 10/10 with household chores where she has to stop and rest. Pt presenting with shuffeling gait pattern using her st cane for balance. Skilled PT needed to address pt's impairments with the below interventions.    Personal Factors and Comorbidities  Age;Comorbidity 2    Comorbidities  HTN, DM    Examination-Activity Limitations  Stairs;Stand;Sit;Bed Mobility    Examination-Participation Restrictions  Cleaning;Community Activity    Stability/Clinical Decision Making  Stable/Uncomplicated    Clinical Decision Making  Low    Rehab Potential  Good    PT Frequency  2x / week    PT Duration  6 weeks    PT Treatment/Interventions  ADLs/Self Care Home Management;Electrical Stimulation;Cryotherapy;Functional mobility training;Therapeutic activities;Therapeutic exercise;Balance training;Gait training;Stair training;Patient/family education;Manual techniques;Taping;Passive range of motion;Dry needling    PT Next Visit Plan  LUmbar stretching, balance, LE strengthening, modalities as needed, STW to lumbar paraspinals    PT Home Exercise Plan  Access Code: [email or web address]    Consulted and Agree with Plan of Care  Patient       Patient will benefit from skilled therapeutic intervention in order to improve the following deficits and impairments:  Pain, Postural dysfunction, Decreased strength, Decreased activity tolerance, Obesity, Decreased range of motion, Impaired flexibility, Difficulty walking, Decreased  balance  Visit Diagnosis: Chronic right-sided low back pain without sciatica  Difficulty in walking, not elsewhere classified  Muscle weakness (generalized)     Problem List Patient Active Problem List   Diagnosis Date Noted  . Chronic right-sided low back pain without sciatica 10/24/2018    Oretha Caprice, PT 11/07/2018, 12:21 PM  [facility] [address] [place], [place], [address] Phone: [phone]   Fax:  [phone]  Name: Kristi Campos MRN: [id number] Date of Birth: Nov 12, 1950

## 2018-11-17 ENCOUNTER — Encounter: Payer: Self-pay | Admitting: Physical Therapy

## 2018-11-17 ENCOUNTER — Ambulatory Visit: Payer: Medicare Other | Admitting: Physical Therapy

## 2018-11-17 ENCOUNTER — Other Ambulatory Visit: Payer: Self-pay

## 2018-11-17 DIAGNOSIS — M545 Low back pain: Secondary | ICD-10-CM | POA: Diagnosis not present

## 2018-11-17 DIAGNOSIS — G8929 Other chronic pain: Secondary | ICD-10-CM

## 2018-11-17 DIAGNOSIS — R262 Difficulty in walking, not elsewhere classified: Secondary | ICD-10-CM

## 2018-11-17 DIAGNOSIS — M6281 Muscle weakness (generalized): Secondary | ICD-10-CM

## 2018-11-17 NOTE — Therapy (Signed)
[place], [place], [address] Phone: [phone]   Fax:  [phone]  Physical Therapy Treatment  Patient Details  Name: Kristi Campos MRN: [id number] Date of Birth: February 11, 1951 Referring Provider (PT): Shelda Pal, [place]   Encounter Date: 11/17/2018  PT End of Session - 11/17/18 1243    Visit Number  2    Number of Visits  12    Date for PT Re-Evaluation  01/07/19    Authorization Type  --    Authorization - Visit Number  --    Authorization - Number of Visits  --    PT Start Time  1230    PT Stop Time  1315    PT Time Calculation (min)  45 min    Activity Tolerance  Patient tolerated treatment well    Behavior During Therapy  [facility] for tasks assessed/performed       Past Medical History:  Diagnosis Date  . Diabetes mellitus without complication ([place])   . Fibroid uterus   . Hypertension     Past Surgical History:  Procedure Laterality Date  . I&D EXTREMITY Right 08/09/2013   Procedure: MINOR IRRIGATION AND DEBRIDEMENT EXTREMITY;  Surgeon: Cammie Sickle, MD;  Location: [place];  Service: Orthopedics;  Laterality: Right;  long       wound class 4    There were no vitals filed for this visit.  Subjective Assessment - 11/17/18 1239    Subjective  Pt arriving to therapy today reporting low back pain on R side. Pt tolerating exercises well. Continue with skilled PT.    Pertinent History  HTN, DM    Limitations  Sitting;House hold activities;Standing;Walking    How long can you sit comfortably?  20 minutes    How long can you stand comfortably?  20 minutes    How long can you walk comfortably?  30 minutes    Patient Stated Goals  Stop hurting    Currently in Pain?  Yes    Pain Score  8     Pain Location  Back    Pain Orientation  Right    Pain Descriptors / Indicators  Aching    Pain Type  Chronic pain    Pain Onset  More than a month ago                        [facility] Adult PT Treatment/Exercise - 11/17/18 0001      Exercises   Exercises  Lumbar      Lumbar Exercises: Stretches   Active Hamstring Stretch  Right;Left;2 reps;30 seconds    Active Hamstring Stretch Limitations  green strap    Single Knee to Chest Stretch  Right;Left;3 reps;10 seconds    Piriformis Stretch  Right;Left;2 reps;20 seconds;Limitations    Piriformis Stretch Limitations  modified      Lumbar Exercises: Aerobic   Nustep  L3 6 minutes      Lumbar Exercises: Supine   Clam  15 reps;3 seconds;Limitations    Clam Limitations  red theraband    Bent Knee Raise  10 reps;3 seconds    Bridge  10 reps;5 seconds    Straight Leg Raise  10 reps;2 seconds;Limitations    Straight Leg Raises Limitations  instruction to keep knee straight      Manual Therapy   Manual Therapy  Soft tissue mobilization    Manual therapy comments  10 minutes  Soft tissue mobilization  R sided low back in sidelying, pirformis and R lumbar paraspinals               PT Short Term Goals - 11/17/18 1252      PT SHORT TERM GOAL #1   Title  Pt will be independent in her HEP.    Baseline  inital HEP issued today    Time  3    Period  Weeks    Status  New    Target Date  11/28/18      PT SHORT TERM GOAL #2   Title  Pt will be able to perform 5 times sit to stand in </= 18 seconds.    Baseline  25.3 seconds using UE support on 11/07/2018    Time  3    Period  Weeks    Status  New    Target Date  11/28/18      PT SHORT TERM GOAL #3   Title  Pt will with able to amb 500 feet with  step through gait pattern  with straight cane.    Baseline  Pt presenting with shuffeling gait pattern with decreased foot clearance on9/09/2000    Time  3    Period  Weeks    Status  New        PT Long Term Goals - 11/17/18 1255      PT LONG TERM GOAL #1   Title  Pt will improve her LE hip strength to grossly >/= 4/5 to improve functional mobility.    Baseline  grossly  3/5 11/07/2018    Period  Weeks    Status  New      PT LONG TERM GOAL #2   Title  Pt will improve her FOTO score to </=  47% limitation.    Time  8    Period  Weeks    Status  New      PT LONG TERM GOAL #3   Title  Pt will be able to amb 20 minutes for community level activities with pain </= 3/10.    Baseline  pt reporitng pain of 6-7/10 with walking prolonged or grocery shopping 11/07/2018    Time  8    Period  Weeks    Status  New            Plan - 11/17/18 1249    Clinical Impression Statement  Pt tolreating exercises well. Still reporting pain in right side low back and into R hip. Pt with limited core strength and LE hip strength. Continue with skilled PT.    Personal Factors and Comorbidities  Age;Comorbidity 2    Comorbidities  HTN, DM    Examination-Activity Limitations  Stairs;Stand;Sit;Bed Mobility    Examination-Participation Restrictions  Cleaning;Community Activity    Stability/Clinical Decision Making  Stable/Uncomplicated    Rehab Potential  Good    PT Frequency  2x / week    PT Duration  6 weeks    PT Treatment/Interventions  ADLs/Self Care Home Management;Electrical Stimulation;Cryotherapy;Functional mobility training;Therapeutic activities;Therapeutic exercise;Balance training;Gait training;Stair training;Patient/family education;Manual techniques;Taping;Passive range of motion;Dry needling    PT Next Visit Plan  LUmbar stretching, balance, LE strengthening, modalities as needed, STW to lumbar paraspinals    PT Home Exercise Plan  Access Code: [email or web address]    Consulted and Agree with Plan of Care  Patient       Patient will benefit from skilled therapeutic intervention in order to improve the following deficits and impairments:  Pain, Postural dysfunction, Decreased strength, Decreased activity tolerance, Obesity, Decreased range of motion, Impaired flexibility, Difficulty walking, Decreased balance  Visit Diagnosis: Chronic right-sided low back pain without  sciatica  Difficulty in walking, not elsewhere classified  Muscle weakness (generalized)     Problem List Patient Active Problem List   Diagnosis Date Noted  . Chronic right-sided low back pain without sciatica 10/24/2018    Oretha Caprice, PT 11/17/2018, 12:58 PM  [facility] [address] [place], [place], [address] Phone: [phone]   Fax:  [phone]  Name: Kristi Campos MRN: [id number] Date of Birth: 06/08/1950

## 2018-11-21 ENCOUNTER — Ambulatory Visit: Payer: Medicare Other | Admitting: Physical Therapy

## 2018-11-21 ENCOUNTER — Encounter: Payer: Self-pay | Admitting: Physical Therapy

## 2018-11-21 ENCOUNTER — Other Ambulatory Visit: Payer: Self-pay

## 2018-11-21 DIAGNOSIS — M545 Low back pain, unspecified: Secondary | ICD-10-CM

## 2018-11-21 DIAGNOSIS — M6281 Muscle weakness (generalized): Secondary | ICD-10-CM

## 2018-11-21 DIAGNOSIS — G8929 Other chronic pain: Secondary | ICD-10-CM

## 2018-11-21 DIAGNOSIS — R262 Difficulty in walking, not elsewhere classified: Secondary | ICD-10-CM

## 2018-11-21 NOTE — Therapy (Signed)
[place], [place], [address] Phone: [phone]   Fax:  [phone]  Physical Therapy Treatment  Patient Details  Name: Kristi Campos MRN: [id number] Date of Birth: 1950-12-12 Referring Provider (PT): Shelda Pal, [place]   Encounter Date: 11/21/2018  PT End of Session - 11/21/18 1152    Visit Number  3    Number of Visits  12    Date for PT Re-Evaluation  01/07/19    Authorization Type  [employer] Medicare/BCBS    PT Start Time  1142    PT Stop Time  1226    PT Time Calculation (min)  44 min       Past Medical History:  Diagnosis Date  . Diabetes mellitus without complication ([place])   . Fibroid uterus   . Hypertension     Past Surgical History:  Procedure Laterality Date  . I&D EXTREMITY Right 08/09/2013   Procedure: MINOR IRRIGATION AND DEBRIDEMENT EXTREMITY;  Surgeon: Cammie Sickle, MD;  Location: [place];  Service: Orthopedics;  Laterality: Right;  long       wound class 4    There were no vitals filed for this visit.  Subjective Assessment - 11/21/18 1151    Subjective  She reports no pain today and compliance with HEP.    Currently in Pain?  No/denies                       [facility] Adult PT Treatment/Exercise - 11/21/18 0001      Ambulation/Gait   Gait Comments  cues for increased step length and correct sequencing with SPC, needs constant cues       Lumbar Exercises: Aerobic   Nustep  L5 LE only x 5 minutes       Lumbar Exercises: Standing   Heel Raises  20 reps    Heel Raises Limitations  20 x toe raises    Other Standing Lumbar Exercises  3 way hip in // bars, with bilat UE, cues for posture and control of LE with therex      Lumbar Exercises: Seated   Sit to Stand  10 reps    Sit to Stand Limitations  without UE      Lumbar Exercises: Supine   Pelvic Tilt  20 reps    Pelvic Tilt Limitations  max verbal and tactile cues     Clam  20  reps    Clam Limitations  red, cues for abdominal draw in    Bent Knee Raise  10 reps;3 seconds    Bridge  10 reps;5 seconds    Straight Leg Raise  10 reps;2 seconds;Limitations    Straight Leg Raises Limitations  cues for abdominal draw in               PT Short Term Goals - 11/17/18 1252      PT SHORT TERM GOAL #1   Title  Pt will be independent in her HEP.    Baseline  inital HEP issued today    Time  3    Period  Weeks    Status  New    Target Date  11/28/18      PT SHORT TERM GOAL #2   Title  Pt will be able to perform 5 times sit to stand in </= 18 seconds.    Baseline  25.3 seconds using UE support on 11/07/2018    Time  3  Period  Weeks    Status  New    Target Date  11/28/18      PT SHORT TERM GOAL #3   Title  Pt will with able to amb 500 feet with  step through gait pattern  with straight cane.    Baseline  Pt presenting with shuffeling gait pattern with decreased foot clearance on9/09/2000    Time  3    Period  Weeks    Status  New        PT Long Term Goals - 11/17/18 1255      PT LONG TERM GOAL #1   Title  Pt will improve her LE hip strength to grossly >/= 4/5 to improve functional mobility.    Baseline  grossly 3/5 11/07/2018    Period  Weeks    Status  New      PT LONG TERM GOAL #2   Title  Pt will improve her FOTO score to </=  47% limitation.    Time  8    Period  Weeks    Status  New      PT LONG TERM GOAL #3   Title  Pt will be able to amb 20 minutes for community level activities with pain </= 3/10.    Baseline  pt reporitng pain of 6-7/10 with walking prolonged or grocery shopping 11/07/2018    Time  8    Period  Weeks    Status  New            Plan - 11/21/18 1227    Clinical Impression Statement  No pain today on arrival. Time spent with gait mechanics with improved improved SPC sequnecing and step length by end of session. Able to progress with closed chain strengthening. No increased pain reported during session.    PT Next  Visit Plan  LUmbar stretching, balance, LE strengthening, modalities as needed, STW to lumbar paraspinals    PT Home Exercise Plan  Access Code: [email or web address]       Patient will benefit from skilled therapeutic intervention in order to improve the following deficits and impairments:     Visit Diagnosis: Chronic right-sided low back pain without sciatica  Difficulty in walking, not elsewhere classified  Muscle weakness (generalized)     Problem List Patient Active Problem List   Diagnosis Date Noted  . Chronic right-sided low back pain without sciatica 10/24/2018    [name], [place] 11/21/2018, 12:30 PM  [facility] [address] [place], [place], [address] Phone: [phone]   Fax:  [phone]  Name: Kristi Campos MRN: [id number] Date of Birth: May 01, 1950

## 2018-11-23 ENCOUNTER — Ambulatory Visit: Payer: Medicare Other | Admitting: Physical Therapy

## 2018-11-23 ENCOUNTER — Other Ambulatory Visit: Payer: Self-pay

## 2018-11-23 DIAGNOSIS — G8929 Other chronic pain: Secondary | ICD-10-CM

## 2018-11-23 DIAGNOSIS — R262 Difficulty in walking, not elsewhere classified: Secondary | ICD-10-CM

## 2018-11-23 DIAGNOSIS — M545 Low back pain: Secondary | ICD-10-CM | POA: Diagnosis not present

## 2018-11-23 DIAGNOSIS — M6281 Muscle weakness (generalized): Secondary | ICD-10-CM

## 2018-11-23 NOTE — Therapy (Signed)
[place], [place], [address] Phone: [phone]   Fax:  [phone]  Physical Therapy Treatment  Patient Details  Name: Kristi Campos MRN: [id number] Date of Birth: 22-Aug-1950 Referring Provider (PT): Shelda Pal, [place]   Encounter Date: 11/23/2018  PT End of Session - 11/23/18 1135    Visit Number  4    Number of Visits  12    Date for PT Re-Evaluation  01/07/19    Authorization Type  UHC Medicare/BCBS    PT Start Time  1100    PT Stop Time  1138    PT Time Calculation (min)  38 min       Past Medical History:  Diagnosis Date  . Diabetes mellitus without complication ([place])   . Fibroid uterus   . Hypertension     Past Surgical History:  Procedure Laterality Date  . I&D EXTREMITY Right 08/09/2013   Procedure: MINOR IRRIGATION AND DEBRIDEMENT EXTREMITY;  Surgeon: Cammie Sickle, MD;  Location: [facility];  Service: Orthopedics;  Laterality: Right;  long       wound class 4    There were no vitals filed for this visit.  Subjective Assessment - 11/23/18 1137    Subjective  I fell at home after last visit.    Currently in Pain?  Yes    Pain Score  3     Pain Location  --   left hip and left shoulder , post fall on left side   Pain Orientation  Left    Pain Descriptors / Indicators  Sore    Aggravating Factors   fall    Pain Relieving Factors  rest                       [facility] Adult PT Treatment/Exercise - 11/23/18 0001      Lumbar Exercises: Stretches   Single Knee to Chest Stretch  Right;Left;3 reps;10 seconds    Lower Trunk Rotation  5 reps      Lumbar Exercises: Aerobic   Nustep  L5 UE /LE x 5 minutes       Lumbar Exercises: Supine   Pelvic Tilt  20 reps    Pelvic Tilt Limitations  min verbal /tactile cues     Clam  20 reps    Clam Limitations  red, cues for abdominal draw in    Bridge  10 reps;5 seconds    Straight Leg Raise  10  reps;2 seconds;Limitations    Straight Leg Raises Limitations  cues for abdominal draw in               PT Short Term Goals - 11/17/18 1252      PT SHORT TERM GOAL #1   Title  Pt will be independent in her HEP.    Baseline  inital HEP issued today    Time  3    Period  Weeks    Status  New    Target Date  11/28/18      PT SHORT TERM GOAL #2   Title  Pt will be able to perform 5 times sit to stand in </= 18 seconds.    Baseline  25.3 seconds using UE support on 11/07/2018    Time  3    Period  Weeks    Status  New    Target Date  11/28/18      PT SHORT TERM GOAL #3  Title  Pt will with able to amb 500 feet with  step through gait pattern  with straight cane.    Baseline  Pt presenting with shuffeling gait pattern with decreased foot clearance on9/09/2000    Time  3    Period  Weeks    Status  New        PT Long Term Goals - 11/17/18 1255      PT LONG TERM GOAL #1   Title  Pt will improve her LE hip strength to grossly >/= 4/5 to improve functional mobility.    Baseline  grossly 3/5 11/07/2018    Period  Weeks    Status  New      PT LONG TERM GOAL #2   Title  Pt will improve her FOTO score to </=  47% limitation.    Time  8    Period  Weeks    Status  New      PT LONG TERM GOAL #3   Title  Pt will be able to amb 20 minutes for community level activities with pain </= 3/10.    Baseline  pt reporitng pain of 6-7/10 with walking prolonged or grocery shopping 11/07/2018    Time  8    Period  Weeks    Status  New            Plan - 11/23/18 1105    Clinical Impression Statement  Pt reports a fall at home after last session. She fell on her left hip and left shoulder both which are now sore and painful. She did not seek medical attention. Mat exercises performed today due to recent fall. Able to complete therex with soreness in left hip.    PT Next Visit Plan  LUmbar stretching, balance, LE strengthening, modalities as needed, STW to lumbar paraspinals    PT  Home Exercise Plan  Access Code: [email or web address]       Patient will benefit from skilled therapeutic intervention in order to improve the following deficits and impairments:  Pain, Postural dysfunction, Decreased strength, Decreased activity tolerance, Obesity, Decreased range of motion, Impaired flexibility, Difficulty walking, Decreased balance  Visit Diagnosis: Chronic right-sided low back pain without sciatica  Difficulty in walking, not elsewhere classified  Muscle weakness (generalized)     Problem List Patient Active Problem List   Diagnosis Date Noted  . Chronic right-sided low back pain without sciatica 10/24/2018    Kristi Campos, [place] 11/23/2018, 11:41 AM  [place], [place], [address] Phone: [phone]   Fax:  [phone]  Name: Kristi Campos MRN: [id number] Date of Birth: 12/08/1950

## 2018-11-24 ENCOUNTER — Encounter: Payer: Self-pay | Admitting: Family Medicine

## 2018-11-24 ENCOUNTER — Ambulatory Visit (INDEPENDENT_AMBULATORY_CARE_PROVIDER_SITE_OTHER): Payer: Medicare Other | Admitting: Family Medicine

## 2018-11-24 VITALS — BP 118/78 | HR 71 | Temp 96.6°F | Ht 63.0 in | Wt 193.1 lb

## 2018-11-24 DIAGNOSIS — E669 Obesity, unspecified: Secondary | ICD-10-CM

## 2018-11-24 DIAGNOSIS — E119 Type 2 diabetes mellitus without complications: Secondary | ICD-10-CM | POA: Insufficient documentation

## 2018-11-24 DIAGNOSIS — W19XXXA Unspecified fall, initial encounter: Secondary | ICD-10-CM | POA: Diagnosis not present

## 2018-11-24 DIAGNOSIS — M25612 Stiffness of left shoulder, not elsewhere classified: Secondary | ICD-10-CM | POA: Diagnosis not present

## 2018-11-24 DIAGNOSIS — Z23 Encounter for immunization: Secondary | ICD-10-CM | POA: Diagnosis not present

## 2018-11-24 DIAGNOSIS — E1169 Type 2 diabetes mellitus with other specified complication: Secondary | ICD-10-CM | POA: Diagnosis not present

## 2018-11-24 NOTE — Progress Notes (Signed)
Chief Complaint  Patient presents with  . Follow-up    diabetes    Subjective: Patient is a 68 y.o. female here for f/u sugars.  Last a1c was 9.9. AM sugars usually in low - mid 100's. Does not check in afteroons.  Diet is fair, she tries to stay active.  It has been around 1 year since her last eye exam.  She continues to take metformin 250 mg twice daily and was recently started on Actos.  Unfortunately, she did not get this prescription until 2 days ago.  3 days ago, the patient fell on her left side.  She is getting better overall but is having some decreased range of motion of the left shoulder.  No bruising, swelling or redness.   ROS: Endo: No wt changes MSK: +shoulder pain  Past Medical History:  Diagnosis Date  . Diabetes mellitus without complication ([place])   . Fibroid uterus   . Hypertension     Objective: BP 118/78 (BP Location: Left Arm, Patient Position: Sitting, Cuff Size: Normal)   Pulse 71   Temp (!) 96.6 F (35.9 C) (Temporal)   Ht 5\' 3"  (1.6 m)   Wt 193 lb 2 oz (87.6 kg)   SpO2 97%   BMI 34.21 kg/m  General: Awake, appears stated age Heart: RRR, no bruits, no lower extremity edema Lungs: CTAB, no rales, wheezes or rhonchi. No accessory muscle use  MSK: mild ttp over the lateral acromion, decreased range of motion diffusely of the left shoulder, no deformity Psych: Age appropriate judgment and insight, normal affect and mood  Assessment and Plan: Diabetes mellitus type 2 in obese ([place])  Fall, initial encounter  Need for tetanus booster - Plan: Td vaccine greater than or equal to 7yo preservative free IM  Need for influenza vaccination - Plan: Flu Vaccine QUAD High Dose(Fluad)  1-uncontrolled; continue current medications for now.  I think she needs to start checking her sugars in the afternoon and evenings.  If they are elevated despite starting the Actos, she will follow-up in 1 month.  Otherwise I will plan on seeing her in 2 months.  Counseled on  diet and exercise. 2- Stretches and exercises were given for the left shoulder to prevent frozen shoulder.  She does state she is improving overall. The patient voiced understanding and agreement to the plan.  [place], DO 11/24/18  12:37 PM

## 2018-11-24 NOTE — Patient Instructions (Addendum)
If sugars don't get better in evening or afternoon, I want to see you in 1 month. If they look good, I will see you in 2 months.  Keep the diet clean and stay active.  Call your eye doctor for your yearly appointment.   Check sugars in afternoon and evening as well.  EXERCISES  RANGE OF MOTION (ROM) AND STRETCHING EXERCISES These exercises may help you when beginning to rehabilitate your injury. While completing these exercises, remember:   Restoring tissue flexibility helps normal motion to return to the joints. This allows healthier, less painful movement and activity.  An effective stretch should be held for at least 30 seconds.  A stretch should never be painful. You should only feel a gentle lengthening or release in the stretched tissue.  ROM - Pendulum  Bend at the waist so that your right / left arm falls away from your body. Support yourself with your opposite hand on a solid surface, such as a table or a countertop.  Your right / left arm should be perpendicular to the ground. If it is not perpendicular, you need to lean over farther. Relax the muscles in your right / left arm and shoulder as much as possible.  Gently sway your hips and trunk so they move your right / left arm without any use of your right / left shoulder muscles.  Progress your movements so that your right / left arm moves side to side, then forward and backward, and finally, both clockwise and counterclockwise.  Complete 10-15 repetitions in each direction. Many people use this exercise to relieve discomfort in their shoulder as well as to gain range of motion. Repeat 2 times. Complete this exercise 3 times per week.  STRETCH - Flexion, Standing  Stand with good posture. With an underhand grip on your right / left hand and an overhand grip on the opposite hand, grasp a broomstick or cane so that your hands are a little more than shoulder-width apart.  Keeping your right / left elbow straight and  shoulder muscles relaxed, push the stick with your opposite hand to raise your right / left arm in front of your body and then overhead. Raise your arm until you feel a stretch in your right / left shoulder, but before you have increased shoulder pain.  Try to avoid shrugging your right / left shoulder as your arm rises by keeping your shoulder blade tucked down and toward your mid-back spine. Hold 30 seconds.  Slowly return to the starting position. Repeat 2 times. Complete this exercise 3 times per week.  STRETCH - Internal Rotation  Place your right / left hand behind your back, palm-up.  Throw a towel or belt over your opposite shoulder. Grasp the towel/belt with your right / left hand.  While keeping an upright posture, gently pull up on the towel/belt until you feel a stretch in the front of your right / left shoulder.  Avoid shrugging your right / left shoulder as your arm rises by keeping your shoulder blade tucked down and toward your mid-back spine.  Hold 30. Release the stretch by lowering your opposite hand. Repeat 2 times. Complete this exercise 3 times per week.  STRETCH - External Rotation and Abduction  Stagger your stance through a doorframe. It does not matter which foot is forward.  As instructed by your physician, physical therapist or athletic trainer, place your hands: ? And forearms above your head and on the door frame. ? And forearms at head-height  and on the door frame. ? At elbow-height and on the door frame.  Keeping your head and chest upright and your stomach muscles tight to prevent over-extending your low-back, slowly shift your weight onto your front foot until you feel a stretch across your chest and/or in the front of your shoulders.  Hold 30 seconds. Shift your weight to your back foot to release the stretch. Repeat 2 times. Complete this stretch 3 times per week.   STRENGTHENING EXERCISES  These exercises may help you when beginning to  rehabilitate your injury. They may resolve your symptoms with or without further involvement from your physician, physical therapist or athletic trainer. While completing these exercises, remember:   Muscles can gain both the endurance and the strength needed for everyday activities through controlled exercises.  Complete these exercises as instructed by your physician, physical therapist or athletic trainer. Progress the resistance and repetitions only as guided.  You may experience muscle soreness or fatigue, but the pain or discomfort you are trying to eliminate should never worsen during these exercises. If this pain does worsen, stop and make certain you are following the directions exactly. If the pain is still present after adjustments, discontinue the exercise until you can discuss the trouble with your clinician.  If advised by your physician, during your recovery, avoid activity or exercises which involve actions that place your right / left hand or elbow above your head or behind your back or head. These positions stress the tissues which are trying to heal.  STRENGTH - Scapular Depression and Adduction  With good posture, sit on a firm chair. Supported your arms in front of you with pillows, arm rests or a table top. Have your elbows in line with the sides of your body.  Gently draw your shoulder blades down and toward your mid-back spine. Gradually increase the tension without tensing the muscles along the top of your shoulders and the back of your neck.  Hold for 3 seconds. Slowly release the tension and relax your muscles completely before completing the next repetition.  After you have practiced this exercise, remove the arm support and complete it in standing as well as sitting. Repeat 2 times. Complete this exercise 3 times per week.   STRENGTH - External Rotators  Secure a rubber exercise band/tubing to a fixed object so that it is at the same height as your right / left  elbow when you are standing or sitting on a firm surface.  Stand or sit so that the secured exercise band/tubing is at your side that is not injured.  Bend your elbow 90 degrees. Place a folded towel or small pillow under your right / left arm so that your elbow is a few inches away from your side.  Keeping the tension on the exercise band/tubing, pull it away from your body, as if pivoting on your elbow. Be sure to keep your body steady so that the movement is only coming from your shoulder rotating.  Hold 3 seconds. Release the tension in a controlled manner as you return to the starting position. Repeat 2 times. Complete this exercise 3 times per week.   STRENGTH - Supraspinatus  Stand or sit with good posture. Grasp a 2-3 lb weight or an exercise band/tubing so that your hand is "thumbs-up," like when you shake hands.  Slowly lift your right / left hand from your thigh into the air, traveling about 30 degrees from straight out at your side. Lift your hand to  shoulder height or as far as you can without increasing any shoulder pain. Initially, many people do not lift their hands above shoulder height.  Avoid shrugging your right / left shoulder as your arm rises by keeping your shoulder blade tucked down and toward your mid-back spine.  Hold for 3 seconds. Control the descent of your hand as you slowly return to your starting position. Repeat 2 times. Complete this exercise 3 times per week.   STRENGTH - Shoulder Extensors  Secure a rubber exercise band/tubing so that it is at the height of your shoulders when you are either standing or sitting on a firm arm-less chair.  With a thumbs-up grip, grasp an end of the band/tubing in each hand. Straighten your elbows and lift your hands straight in front of you at shoulder height. Step back away from the secured end of band/tubing until it becomes tense.  Squeezing your shoulder blades together, pull your hands down to the sides of your  thighs. Do not allow your hands to go behind you.  Hold for 3 seconds. Slowly ease the tension on the band/tubing as you reverse the directions and return to the starting position. Repeat 2 times. Complete this exercise 3 times per week.   STRENGTH - Scapular Retractors  Secure a rubber exercise band/tubing so that it is at the height of your shoulders when you are either standing or sitting on a firm arm-less chair.  With a palm-down grip, grasp an end of the band/tubing in each hand. Straighten your elbows and lift your hands straight in front of you at shoulder height. Step back away from the secured end of band/tubing until it becomes tense.  Squeezing your shoulder blades together, draw your elbows back as you bend them. Keep your upper arm lifted away from your body throughout the exercise.  Hold 3 seconds. Slowly ease the tension on the band/tubing as you reverse the directions and return to the starting position. Repeat 2 times. Complete this exercise 3 times per week.  STRENGTH - Scapular Depressors  Find a sturdy chair without wheels, such as a from a dining room table.  Keeping your feet on the floor, lift your bottom from the seat and lock your elbows.  Keeping your elbows straight, allow gravity to pull your body weight down. Your shoulders will rise toward your ears.  Raise your body against gravity by drawing your shoulder blades down your back, shortening the distance between your shoulders and ears. Although your feet should always maintain contact with the floor, your feet should progressively support less body weight as you get stronger.  Hold 3 seconds. In a controlled and slow manner, lower your body weight to begin the next repetition. Repeat 2 times. Complete this exercise 3 times per week.   This information is not intended to replace advice given to you by your health care provider. Make sure you discuss any questions you have with your health care  provider.  Document Released: 12/30/2004 Document Revised: 03/08/2014 Document Reviewed: 05/30/2008 Elsevier Interactive Patient Education [employer].

## 2018-11-28 ENCOUNTER — Other Ambulatory Visit: Payer: Self-pay

## 2018-11-28 ENCOUNTER — Ambulatory Visit: Payer: Medicare Other | Admitting: Physical Therapy

## 2018-11-28 ENCOUNTER — Encounter: Payer: Self-pay | Admitting: Physical Therapy

## 2018-11-28 DIAGNOSIS — G8929 Other chronic pain: Secondary | ICD-10-CM

## 2018-11-28 DIAGNOSIS — M6281 Muscle weakness (generalized): Secondary | ICD-10-CM

## 2018-11-28 DIAGNOSIS — R262 Difficulty in walking, not elsewhere classified: Secondary | ICD-10-CM

## 2018-11-28 DIAGNOSIS — M545 Low back pain: Secondary | ICD-10-CM | POA: Diagnosis not present

## 2018-11-28 NOTE — Therapy (Signed)
[place], [place], [address] Phone: [phone]   Fax:  [phone]  Physical Therapy Treatment  Patient Details  Name: Kristi Campos MRN: [id number] Date of Birth: 09-21-1950 Referring Provider (PT): Shelda Pal, [place]   Encounter Date: 11/28/2018  PT End of Session - 11/28/18 1132    Visit Number  5    Number of Visits  12    Date for PT Re-Evaluation  01/07/19    Authorization Type  UHC Medicare/BCBS    PT Start Time  1100    PT Stop Time  1145    PT Time Calculation (min)  45 min       Past Medical History:  Diagnosis Date  . Diabetes mellitus without complication ([place])   . Fibroid uterus   . Hypertension     Past Surgical History:  Procedure Laterality Date  . I&D EXTREMITY Right 08/09/2013   Procedure: MINOR IRRIGATION AND DEBRIDEMENT EXTREMITY;  Surgeon: Cammie Sickle, MD;  Location: [facility];  Service: Orthopedics;  Laterality: Right;  long       wound class 4    There were no vitals filed for this visit.  Subjective Assessment - 11/28/18 1124    Subjective  Shoulder and hip are better since falling down last week. I have a [place] bracelet now in case I fall.    Currently in Pain?  No/denies                       [facility] Adult PT Treatment/Exercise - 11/28/18 0001      Ambulation/Gait   Pre-Gait Activities  tapping forward,side and back then weight shifting forwward side and back each side x 3 rounds. side stepping x 4 in parallel bars, tandem gait       Neuro Re-ed    Neuro Re-ed Details   tandem stance, narrow stance on foarm with head turns, alternating step taps and unilateral step taps without UE to 6 inch step       Lumbar Exercises: Aerobic   Nustep  L5 UE/LE x 5 minutes       Lumbar Exercises: Standing   Heel Raises  20 reps      Lumbar Exercises: Seated   Sit to Stand  10 reps   2 sets   Sit to Stand Limitations   without UE      Lumbar Exercises: Supine   Bridge  20 reps    Straight Leg Raise  10 reps;2 seconds;Limitations    Straight Leg Raises Limitations  cues for abdominal draw in               PT Short Term Goals - 11/17/18 1252      PT SHORT TERM GOAL #1   Title  Pt will be independent in her HEP.    Baseline  inital HEP issued today    Time  3    Period  Weeks    Status  New    Target Date  11/28/18      PT SHORT TERM GOAL #2   Title  Pt will be able to perform 5 times sit to stand in </= 18 seconds.    Baseline  25.3 seconds using UE support on 11/07/2018    Time  3    Period  Weeks    Status  New    Target Date  11/28/18  PT SHORT TERM GOAL #3   Title  Pt will with able to amb 500 feet with  step through gait pattern  with straight cane.    Baseline  Pt presenting with shuffeling gait pattern with decreased foot clearance on9/09/2000    Time  3    Period  Weeks    Status  New        PT Long Term Goals - 11/17/18 1255      PT LONG TERM GOAL #1   Title  Pt will improve her LE hip strength to grossly >/= 4/5 to improve functional mobility.    Baseline  grossly 3/5 11/07/2018    Period  Weeks    Status  New      PT LONG TERM GOAL #2   Title  Pt will improve her FOTO score to </=  47% limitation.    Time  8    Period  Weeks    Status  New      PT LONG TERM GOAL #3   Title  Pt will be able to amb 20 minutes for community level activities with pain </= 3/10.    Baseline  pt reporitng pain of 6-7/10 with walking prolonged or grocery shopping 11/07/2018    Time  8    Period  Weeks    Status  New            Plan - 11/28/18 1143    Clinical Impression Statement  Pt arrives with LIFE ALERT wrist band in case of falls. She is feeling better today with no pain in shoulder and hip since falling last week. She requires cues to maintain proper sequencing with cane. She has episodes of shuffling pattern in clinic. She reaches for walls and counters during session.  We were able to resume standing balance training today and pre-gait activites without increased pain.    PT Next Visit Plan  LUmbar stretching, balance, LE strengthening, modalities as needed, STW to lumbar paraspinals    PT Home Exercise Plan  Access Code: [email or web address]       Patient will benefit from skilled therapeutic intervention in order to improve the following deficits and impairments:  Pain, Postural dysfunction, Decreased strength, Decreased activity tolerance, Obesity, Decreased range of motion, Impaired flexibility, Difficulty walking, Decreased balance  Visit Diagnosis: Chronic right-sided low back pain without sciatica  Difficulty in walking, not elsewhere classified  Muscle weakness (generalized)     Problem List Patient Active Problem List   Diagnosis Date Noted  . Diabetes mellitus type 2 in obese ([place]) 11/24/2018  . Chronic right-sided low back pain without sciatica 10/24/2018    [name], [place] 11/28/2018, 12:43 PM  [facility] [address] [place], [place], [address] Phone: [phone]   Fax:  [phone]  Name: Kristi Campos MRN: [id number] Date of Birth: 1950/10/24

## 2018-11-30 ENCOUNTER — Encounter: Payer: Self-pay | Admitting: Physical Therapy

## 2018-11-30 ENCOUNTER — Ambulatory Visit: Payer: Medicare Other | Attending: Family Medicine | Admitting: Physical Therapy

## 2018-11-30 ENCOUNTER — Other Ambulatory Visit: Payer: Self-pay

## 2018-11-30 DIAGNOSIS — M545 Low back pain: Secondary | ICD-10-CM | POA: Insufficient documentation

## 2018-11-30 DIAGNOSIS — R262 Difficulty in walking, not elsewhere classified: Secondary | ICD-10-CM | POA: Diagnosis present

## 2018-11-30 DIAGNOSIS — M6281 Muscle weakness (generalized): Secondary | ICD-10-CM | POA: Diagnosis present

## 2018-11-30 DIAGNOSIS — G8929 Other chronic pain: Secondary | ICD-10-CM | POA: Diagnosis present

## 2018-11-30 NOTE — Therapy (Signed)
[place], [place], [address] Phone: [phone]   Fax:  [phone]  Physical Therapy Treatment  Patient Details  Name: Kristi Campos MRN: [id number] Date of Birth: [birth year] Referring Provider (PT): Shelda Pal, [place]   Encounter Date: 11/30/2018  PT End of Session - 11/30/18 1107    Visit Number  6    Number of Visits  12    Date for PT Re-Evaluation  01/07/19    Authorization Type  UHC Medicare/BCBS    PT Start Time  1057    PT Stop Time  [id number]    PT Time Calculation (min)  51 min       Past Medical History:  Diagnosis Date  . Diabetes mellitus without complication ([place])   . Fibroid uterus   . Hypertension     Past Surgical History:  Procedure Laterality Date  . I&D EXTREMITY Right 08/09/2013   Procedure: MINOR IRRIGATION AND DEBRIDEMENT EXTREMITY;  Surgeon: Cammie Sickle, MD;  Location: [facility];  Service: Orthopedics;  Laterality: Right;  long       wound class 4    There were no vitals filed for this visit.  Subjective Assessment - 11/30/18 1106    Subjective  My back hurts so bad when I am up doing stuff for about 20 or 30 minutes.                       [facility] Adult PT Treatment/Exercise - 11/30/18 0001      Lumbar Exercises: Stretches   Single Knee to Chest Stretch  Right;Left;3 reps;10 seconds    Lower Trunk Rotation  10 seconds      Lumbar Exercises: Aerobic   Nustep  L5 UE/LE x 5 minutes       Lumbar Exercises: Supine   Pelvic Tilt  20 reps    Pelvic Tilt Limitations  min verbal /tactile cues     Clam  20 reps    Clam Limitations  red, cues for abdominal draw in    Bridge  10 reps;5 seconds      Modalities   Modalities  Moist Heat      Moist Heat Therapy   Number Minutes Moist Heat  10 Minutes    Moist Heat Location  Lumbar Spine   in left side lying     Manual Therapy   Manual therapy comments  10 minutes    Soft  tissue mobilization  R sided low back in sidelying, pirformis and R lumbar paraspinals               PT Short Term Goals - 11/17/18 1252      PT SHORT TERM GOAL #1   Title  Pt will be independent in her HEP.    Baseline  inital HEP issued today    Time  3    Period  Weeks    Status  New    Target Date  11/28/18      PT SHORT TERM GOAL #2   Title  Pt will be able to perform 5 times sit to stand in </= 18 seconds.    Baseline  25.3 seconds using UE support on 11/07/2018    Time  3    Period  Weeks    Status  New    Target Date  11/28/18      PT SHORT TERM GOAL #3   Title  Pt  will with able to amb 500 feet with  step through gait pattern  with straight cane.    Baseline  Pt presenting with shuffeling gait pattern with decreased foot clearance on9/09/2000    Time  3    Period  Weeks    Status  New        PT Long Term Goals - 11/17/18 1255      PT LONG TERM GOAL #1   Title  Pt will improve her LE hip strength to grossly >/= 4/5 to improve functional mobility.    Baseline  grossly 3/5 11/07/2018    Period  Weeks    Status  New      PT LONG TERM GOAL #2   Title  Pt will improve her FOTO score to </=  47% limitation.    Time  8    Period  Weeks    Status  New      PT LONG TERM GOAL #3   Title  Pt will be able to amb 20 minutes for community level activities with pain </= 3/10.    Baseline  pt reporitng pain of 6-7/10 with walking prolonged or grocery shopping 11/07/2018    Time  8    Period  Weeks    Status  New            Plan - 11/30/18 1111    Clinical Impression Statement  Pt arrives reporting increased pain with standing activity located in right lowe back. She continues to require cues for gait and SPC sequencing. Focused on her trunk flexibility and core strength. Performed STW folled by HMP to right lower lumbar and upper gluteals.    PT Next Visit Plan  LUmbar stretching, balance, LE strengthening, modalities as needed, STW to lumbar paraspinals, Test  BERG    PT Home Exercise Plan  Access Code: [email or web address]       Patient will benefit from skilled therapeutic intervention in order to improve the following deficits and impairments:  Pain, Postural dysfunction, Decreased strength, Decreased activity tolerance, Obesity, Decreased range of motion, Impaired flexibility, Difficulty walking, Decreased balance  Visit Diagnosis: No diagnosis found.     Problem List Patient Active Problem List   Diagnosis Date Noted  . Diabetes mellitus type 2 in obese ([place]) 11/24/2018  . Chronic right-sided low back pain without sciatica 10/24/2018    [name], [place] 11/30/2018, 1:13 PM  [facility] [address] [place], [place], [address] Phone: [phone]   Fax:  [phone]  Name: Kristi Campos MRN: [id number] Date of Birth: 12-Oct-1950

## 2018-12-11 ENCOUNTER — Telehealth: Payer: Self-pay | Admitting: Family Medicine

## 2018-12-11 NOTE — Telephone Encounter (Signed)
Called informed the patient of PCP instructions. She did verbalize understanding.

## 2018-12-11 NOTE — Telephone Encounter (Signed)
To my knowledge, a very select few lots of the extended release Metformin were implicated so I want her to continue as she is not on the extended release. She can call her pharmacy to double check as well. Ty.

## 2018-12-11 NOTE — Telephone Encounter (Signed)
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 Please advise.  [facility] [place], [place]  CB#: [phone]

## 2018-12-12 ENCOUNTER — Ambulatory Visit: Payer: Medicare Other | Admitting: Physical Therapy

## 2018-12-14 ENCOUNTER — Encounter: Payer: Self-pay | Admitting: Physical Therapy

## 2018-12-14 ENCOUNTER — Ambulatory Visit: Payer: Medicare Other | Admitting: Physical Therapy

## 2018-12-14 ENCOUNTER — Other Ambulatory Visit: Payer: Self-pay

## 2018-12-14 DIAGNOSIS — M545 Low back pain: Secondary | ICD-10-CM | POA: Diagnosis not present

## 2018-12-14 DIAGNOSIS — R262 Difficulty in walking, not elsewhere classified: Secondary | ICD-10-CM

## 2018-12-14 DIAGNOSIS — M6281 Muscle weakness (generalized): Secondary | ICD-10-CM

## 2018-12-14 DIAGNOSIS — G8929 Other chronic pain: Secondary | ICD-10-CM

## 2018-12-14 NOTE — Therapy (Signed)
[place], [place], [address] Phone: [phone]   Fax:  [phone]  Physical Therapy Treatment  Patient Details  Name: Sharley Bohner MRN: [id number] Date of Birth: 1950/05/16 Referring Provider (PT): Shelda Pal, [place]   Encounter Date: 12/14/2018  PT End of Session - 12/14/18 1630    Visit Number  7    Number of Visits  12    Date for PT Re-Evaluation  01/07/19    Authorization Type  UHC Medicare/BCBS    PT Start Time  [id number]    PT Stop Time  1629    PT Time Calculation (min)  46 min    Activity Tolerance  Patient tolerated treatment well    Behavior During Therapy  [facility] for tasks assessed/performed       Past Medical History:  Diagnosis Date  . Diabetes mellitus without complication ([place])   . Fibroid uterus   . Hypertension     Past Surgical History:  Procedure Laterality Date  . I&D EXTREMITY Right 08/09/2013   Procedure: MINOR IRRIGATION AND DEBRIDEMENT EXTREMITY;  Surgeon: Cammie Sickle, MD;  Location: [facility];  Service: Orthopedics;  Laterality: Right;  long       wound class 4    There were no vitals filed for this visit.  Subjective Assessment - 12/14/18 1543    Subjective  Feeling good today. My Left arm hurts, 2/10 it is very hard to lift it.    Currently in Pain?  No/denies         [facility] PT Assessment - 12/14/18 0001      Assessment   Medical Diagnosis  R sided LBP    Referring Provider (PT)  Nani Ravens, Crosby Oyster, DO      Berg Balance Test   Sit to Stand  Able to stand without using hands and stabilize independently    Standing Unsupported  Able to stand safely 2 minutes    Sitting with Back Unsupported but Feet Supported on Floor or Stool  Able to sit safely and securely 2 minutes    Stand to Sit  Sits safely with minimal use of hands    Transfers  Able to transfer safely, definite need of hands    Standing Unsupported with Eyes Closed   Able to stand 10 seconds safely    Standing Unsupported with Feet Together  Able to place feet together independently and stand 1 minute safely    From Standing, Reach Forward with Outstretched Arm  Can reach forward >5 cm safely (2")    From Standing Position, Pick up Object from [place] to pick up shoe safely and easily    From Standing Position, Turn to Look Behind Over each Shoulder  Looks behind from both sides and weight shifts well    Turn 360 Degrees  Needs close supervision or verbal cueing    Standing Unsupported, Alternately Place Feet on Step/Stool  Able to complete >2 steps/needs minimal assist    Standing Unsupported, One Foot in Front  Able to plae foot ahead of the other independently and hold 30 seconds    Standing on One Leg  Able to lift leg independently and hold equal to or more than 3 seconds    Total Score  44                   [facility] Adult PT Treatment/Exercise - 12/14/18 0001      Lumbar Exercises:  Stretches   Passive Hamstring Stretch Limitations  seated EOB      Lumbar Exercises: Aerobic   Nustep  L5 UE/LE x 5 minutes       Lumbar Exercises: Standing   Other Standing Lumbar Exercises  gait training- higher steps, heel, toe, cane in left hand, Rt foot ahead to turn Lt             PT Education - 12/14/18 1631    Education Details  BERG, gait test 500 ft in clinic, gait pattern    Person(s) Educated  Patient    Methods  Explanation;Demonstration;Tactile cues;Verbal cues    Comprehension  Verbalized understanding;Returned demonstration;Verbal cues required;Tactile cues required       PT Short Term Goals - 12/14/18 1545      PT SHORT TERM GOAL #1   Title  Pt will be independent in her HEP.    Status  Achieved      PT SHORT TERM GOAL #2   Title  Pt will be able to perform 5 times sit to stand in </= 18 seconds.    Baseline  22s on 10/15, improved from 25    Status  On-going      PT SHORT TERM GOAL #3   Title  Pt will with able to  amb 500 feet with  step through gait pattern  with straight cane.    Baseline  able to demo in clinic, reports fatigue in Rt LE; shuffled Rt foot as she fatigued but able to maintain balance    Status  Achieved        PT Long Term Goals - 11/17/18 1255      PT LONG TERM GOAL #1   Title  Pt will improve her LE hip strength to grossly >/= 4/5 to improve functional mobility.    Baseline  grossly 3/5 11/07/2018    Period  Weeks    Status  New      PT LONG TERM GOAL #2   Title  Pt will improve her FOTO score to </=  47% limitation.    Time  8    Period  Weeks    Status  New      PT LONG TERM GOAL #3   Title  Pt will be able to amb 20 minutes for community level activities with pain </= 3/10.    Baseline  pt reporitng pain of 6-7/10 with walking prolonged or grocery shopping 11/07/2018    Time  8    Period  Weeks    Status  New            Plan - 12/14/18 1613    Clinical Impression Statement  Ataxic gait pattern noted today- Rt foot freezing and getting left behind-almost parkinsonian. Encouraged pt to stomp her feet which improved ability to begin walking. Able to turn to the Rt appropriately but turning to the Left, the Right foot freezes. Able to change surfaces without hesitation. suspect underlying neurological involvement. I asked her to contact PCP and request earlier appointment to discuss this    PT Treatment/Interventions  ADLs/Self Care Home Management;Electrical Stimulation;Cryotherapy;Functional mobility training;Therapeutic activities;Therapeutic exercise;Balance training;Gait training;Stair training;Patient/family education;Manual techniques;Taping;Passive range of motion;Dry needling    PT Next Visit Plan  gross stretching & CKC strenthening, gait pattern.    PT Home Exercise Plan  Access Code: [email or web address]., high stepping/stomping    Consulted and Agree with Plan of Care  Patient       Patient will benefit from  skilled therapeutic intervention in order to improve the  following deficits and impairments:  Pain, Postural dysfunction, Decreased strength, Decreased activity tolerance, Obesity, Decreased range of motion, Impaired flexibility, Difficulty walking, Decreased balance  Visit Diagnosis: Chronic right-sided low back pain without sciatica  Difficulty in walking, not elsewhere classified  Muscle weakness (generalized)     Problem List Patient Active Problem List   Diagnosis Date Noted  . Diabetes mellitus type 2 in obese ([place]) 11/24/2018  . Chronic right-sided low back pain without sciatica 10/24/2018    Jamaul Heist C. Emmalyn Hinson PT, DPT 12/14/18 4:32 PM   [place] [facility] [address] [place], [place], [address] Phone: [phone]   Fax:  [phone]  Name: Dawnna Borroel MRN: [id number] Date of Birth: 15-Mar-1950

## 2018-12-19 ENCOUNTER — Other Ambulatory Visit: Payer: Self-pay

## 2018-12-19 ENCOUNTER — Ambulatory Visit: Payer: Medicare Other | Admitting: Physical Therapy

## 2018-12-19 ENCOUNTER — Encounter: Payer: Self-pay | Admitting: Physical Therapy

## 2018-12-19 DIAGNOSIS — G8929 Other chronic pain: Secondary | ICD-10-CM

## 2018-12-19 DIAGNOSIS — M545 Low back pain: Secondary | ICD-10-CM | POA: Diagnosis not present

## 2018-12-19 DIAGNOSIS — R262 Difficulty in walking, not elsewhere classified: Secondary | ICD-10-CM

## 2018-12-19 DIAGNOSIS — M6281 Muscle weakness (generalized): Secondary | ICD-10-CM

## 2018-12-19 NOTE — Therapy (Signed)
[place], [place], [address] Phone: [phone]   Fax:  [phone]  Physical Therapy Treatment  Patient Details  Name: Kristi Campos MRN: [id number] Date of Birth: December 09, 1950 Referring Provider (PT): Shelda Pal, [place]   Encounter Date: 12/19/2018  PT End of Session - 12/19/18 1025    Visit Number  8    Number of Visits  12    Date for PT Re-Evaluation  01/07/19    Authorization Type  UHC Medicare/BCBS    PT Start Time  1015    PT Stop Time  1055    PT Time Calculation (min)  40 min       Past Medical History:  Diagnosis Date  . Diabetes mellitus without complication ([place])   . Fibroid uterus   . Hypertension     Past Surgical History:  Procedure Laterality Date  . I&D EXTREMITY Right 08/09/2013   Procedure: MINOR IRRIGATION AND DEBRIDEMENT EXTREMITY;  Surgeon: Cammie Sickle, MD;  Location: [facility];  Service: Orthopedics;  Laterality: Right;  long       wound class 4    There were no vitals filed for this visit.  Subjective Assessment - 12/19/18 1018    Subjective  My back feels good. But my outside thigh feels tired but its not pain. I feel it when I am walking around the house doing house work. I cannot stand to do dishes for very long due to my outside thigh feeling tired.                       [facility] Adult PT Treatment/Exercise - 12/19/18 0001      Lumbar Exercises: Standing   Heel Raises  20 reps    Other Standing Lumbar Exercises  reminder to use SPC in left hand    Other Standing Lumbar Exercises  standing 3 way hip bilateral x 10 each , 1 UE support , alternating and uniateral step taps without UE, 6 inch , side stepping in parallel bars       Lumbar Exercises: Seated   Sit to Stand  10 reps    Sit to Stand Limitations  without UE      Lumbar Exercises: Supine   Clam  20 reps    Clam Limitations  green band     Bridge  20 reps     Straight Leg Raise  10 reps;2 seconds;Limitations    Straight Leg Raises Limitations  cues for abdominal draw in      Lumbar Exercises: Sidelying   Hip Abduction  10 reps               PT Short Term Goals - 12/14/18 1545      PT SHORT TERM GOAL #1   Title  Pt will be independent in her HEP.    Status  Achieved      PT SHORT TERM GOAL #2   Title  Pt will be able to perform 5 times sit to stand in </= 18 seconds.    Baseline  22s on 10/15, improved from 25    Status  On-going      PT SHORT TERM GOAL #3   Title  Pt will with able to amb 500 feet with  step through gait pattern  with straight cane.    Baseline  able to demo in clinic, reports fatigue in Rt LE; shuffled Rt foot as she  fatigued but able to maintain balance    Status  Achieved        PT Long Term Goals - 11/17/18 1255      PT LONG TERM GOAL #1   Title  Pt will improve her LE hip strength to grossly >/= 4/5 to improve functional mobility.    Baseline  grossly 3/5 11/07/2018    Period  Weeks    Status  New      PT LONG TERM GOAL #2   Title  Pt will improve her FOTO score to </=  47% limitation.    Time  8    Period  Weeks    Status  New      PT LONG TERM GOAL #3   Title  Pt will be able to amb 20 minutes for community level activities with pain </= 3/10.    Baseline  pt reporitng pain of 6-7/10 with walking prolonged or grocery shopping 11/07/2018    Time  8    Period  Weeks    Status  New            Plan - 12/19/18 1124    Clinical Impression Statement  Pt reports she has an appointment with MD to discuss her gait pattern as recommended by PT. She reports no LBP however is limited by lateral thigh fatigue when performing household activities. Focused hip/core strength and balance today. Fatigues with sidelying hip abduction.    PT Next Visit Plan  gross stretching & CKC strenthening, gait pattern.    PT Home Exercise Plan  Access Code: [email or web address]., high stepping/stomping       Patient will  benefit from skilled therapeutic intervention in order to improve the following deficits and impairments:  Pain, Postural dysfunction, Decreased strength, Decreased activity tolerance, Obesity, Decreased range of motion, Impaired flexibility, Difficulty walking, Decreased balance  Visit Diagnosis: Chronic right-sided low back pain without sciatica  Difficulty in walking, not elsewhere classified  Muscle weakness (generalized)     Problem List Patient Active Problem List   Diagnosis Date Noted  . Diabetes mellitus type 2 in obese ([place]) 11/24/2018  . Chronic right-sided low back pain without sciatica 10/24/2018    [name], [place] 12/19/2018, 11:31 AM  [place], [place], [address] Phone: [phone]   Fax:  [phone]  Name: Kristi Campos MRN: [id number] Date of Birth: [birth year]

## 2018-12-20 ENCOUNTER — Other Ambulatory Visit: Payer: Self-pay

## 2018-12-20 ENCOUNTER — Encounter: Payer: Self-pay | Admitting: Family Medicine

## 2018-12-20 ENCOUNTER — Ambulatory Visit (INDEPENDENT_AMBULATORY_CARE_PROVIDER_SITE_OTHER): Payer: Medicare Other | Admitting: Family Medicine

## 2018-12-20 ENCOUNTER — Other Ambulatory Visit: Payer: Self-pay | Admitting: *Deleted

## 2018-12-20 VITALS — BP 106/70 | HR 81 | Temp 96.4°F | Resp 12 | Ht 63.0 in | Wt 198.8 lb

## 2018-12-20 DIAGNOSIS — R29898 Other symptoms and signs involving the musculoskeletal system: Secondary | ICD-10-CM

## 2018-12-20 MED ORDER — PIOGLITAZONE HCL 30 MG PO TABS: 30.0000 mg | ORAL_TABLET | Freq: Every day | ORAL | 1 refills | Status: DC

## 2018-12-20 NOTE — Progress Notes (Signed)
Chief Complaint  Patient presents with  . right leg drag    Subjective: Patient is a 68 y.o. female here for R LE weakness.  4-5 years had has weakness in RLE. Just finished course of PT and back is better. Therapist concerned for neurologic issues. Will have balance issues while walking. Feels fine is she is sitting or laying down. Has had intermittent falls. PT and HEP for RLE was not helpful, reports compliance. No numbness/tingling.   ROS: Neuro: +weakness MSK: No RLE pain  Past Medical History:  Diagnosis Date  . Diabetes mellitus without complication ([place])   . Fibroid uterus   . Hypertension     Objective: BP 106/70 (BP Location: Left Arm, Cuff Size: Large)   Pulse 81   Temp (!) 96.4 F (35.8 C) (Temporal)   Resp 12   Ht 5\' 3"  (1.6 m)   Wt 198 lb 12.8 oz (90.2 kg)   SpO2 99%   BMI 35.22 kg/m  General: Awake, appears stated age Neuro: 1/4 DTR b/l, no clonus, favors RLE when walking, sensation over lateral thigh decreased on R lateral thigh compared to other areas where sensation is normal. Strength is 4/5 with knee ext and hip flexion on R, 5/5 strength throughout otherwise. MSK: No ttp over RLE, there is muscle atrophy on R compared to L thigh Lungs: CTAB, no rales, wheezes or rhonchi. No accessory muscle use Psych: Age appropriate judgment and insight, normal affect and mood  Assessment and Plan: Weakness of right lower extremity - Plan: Ambulatory referral to Neurology  Orders as above. Cont HEP from PT. F/u as originally scheduled.  The patient voiced understanding and agreement to the plan.  [place], DO 12/20/18  1:41 PM

## 2018-12-20 NOTE — Patient Instructions (Addendum)
If you do not hear anything about your referral in the next 1-2 weeks, call our office and ask for an update.  Continue the home exercise program you received from PT.   Let us know if you need anything.

## 2018-12-21 ENCOUNTER — Ambulatory Visit: Payer: Medicare Other | Admitting: Physical Therapy

## 2018-12-21 ENCOUNTER — Encounter: Payer: Self-pay | Admitting: Physical Therapy

## 2018-12-21 DIAGNOSIS — M6281 Muscle weakness (generalized): Secondary | ICD-10-CM

## 2018-12-21 DIAGNOSIS — M545 Low back pain, unspecified: Secondary | ICD-10-CM

## 2018-12-21 DIAGNOSIS — G8929 Other chronic pain: Secondary | ICD-10-CM

## 2018-12-21 DIAGNOSIS — R262 Difficulty in walking, not elsewhere classified: Secondary | ICD-10-CM

## 2018-12-21 NOTE — Therapy (Signed)
[place], [place], [address] Phone: [phone]   Fax:  [phone]  Physical Therapy Treatment  Patient Details  Name: Kristi Campos MRN: [id number] Date of Birth: [birth year] Referring Provider (PT): Shelda Pal, [place]   Encounter Date: 12/21/2018  PT End of Session - 12/21/18 1546    Visit Number  9    Number of Visits  12    Date for PT Re-Evaluation  01/07/19    Authorization Type  UHC Medicare/BCBS    PT Start Time  1545    PT Stop Time  1623    PT Time Calculation (min)  38 min    Activity Tolerance  Patient tolerated treatment well    Behavior During Therapy  [facility] for tasks assessed/performed       Past Medical History:  Diagnosis Date  . Diabetes mellitus without complication ([place])   . Fibroid uterus   . Hypertension     Past Surgical History:  Procedure Laterality Date  . I&D EXTREMITY Right 08/09/2013   Procedure: MINOR IRRIGATION AND DEBRIDEMENT EXTREMITY;  Surgeon: Cammie Sickle, MD;  Location: [facility];  Service: Orthopedics;  Laterality: Right;  long       wound class 4    There were no vitals filed for this visit.  Subjective Assessment - 12/21/18 1547    Subjective  Last night, Rt lower back was bothering me. Feels a little better now but hurts when I walk.    Patient Stated Goals  Stop hurting    Currently in Pain?  Yes    Pain Score  1     Pain Location  Back    Pain Orientation  Left;Right;Lower    Pain Descriptors / Indicators  Sore    Aggravating Factors   walking    Pain Relieving Factors  rest                       [facility] Adult PT Treatment/Exercise - 12/21/18 0001      Lumbar Exercises: Stretches   Passive Hamstring Stretch Limitations  large fwd reach x5 seated EOB      Lumbar Exercises: Aerobic   Nustep  5 min L5 UE & LE      Lumbar Exercises: Standing   Heel Raises  20 reps    Heel Raises Limitations  at  counter    Forward Lunge Limitations  big step& return    Side Lunge Limitations  big step & return    Other Standing Lumbar Exercises  gait- large steps, avoid Rt freezing, keep Right ahead when turning, stop/go, quick turns      Lumbar Exercises: Seated   Sit to Stand  10 reps    Sit to Stand Limitations  with OH reach               PT Short Term Goals - 12/14/18 1545      PT SHORT TERM GOAL #1   Title  Pt will be independent in her HEP.    Status  Achieved      PT SHORT TERM GOAL #2   Title  Pt will be able to perform 5 times sit to stand in </= 18 seconds.    Baseline  22s on 10/15, improved from 25    Status  On-going      PT SHORT TERM GOAL #3   Title  Pt will with able to amb  500 feet with  step through gait pattern  with straight cane.    Baseline  able to demo in clinic, reports fatigue in Rt LE; shuffled Rt foot as she fatigued but able to maintain balance    Status  Achieved        PT Long Term Goals - 11/17/18 1255      PT LONG TERM GOAL #1   Title  Pt will improve her LE hip strength to grossly >/= 4/5 to improve functional mobility.    Baseline  grossly 3/5 11/07/2018    Period  Weeks    Status  New      PT LONG TERM GOAL #2   Title  Pt will improve her FOTO score to </=  47% limitation.    Time  8    Period  Weeks    Status  New      PT LONG TERM GOAL #3   Title  Pt will be able to amb 20 minutes for community level activities with pain </= 3/10.    Baseline  pt reporitng pain of 6-7/10 with walking prolonged or grocery shopping 11/07/2018    Time  8    Period  Weeks    Status  New            Plan - 12/21/18 1625    Clinical Impression Statement  She did a lot of walking and did better with stopping and making her right foot step without freezing. did require assistance on multiple occasions to avoid falling when pivoting and rounding corners. Reported fatigue in her Right leg and resolution of LBP.    PT Treatment/Interventions   ADLs/Self Care Home Management;Electrical Stimulation;Cryotherapy;Functional mobility training;Therapeutic activities;Therapeutic exercise;Balance training;Gait training;Stair training;Patient/family education;Manual techniques;Taping;Passive range of motion;Dry needling    PT Next Visit Plan  continue gait training & large steps. how did neurologist visit go?    PT Home Exercise Plan  Access Code: [email or web address]., high stepping/stomping    Consulted and Agree with Plan of Care  Patient       Patient will benefit from skilled therapeutic intervention in order to improve the following deficits and impairments:  Pain, Postural dysfunction, Decreased strength, Decreased activity tolerance, Obesity, Decreased range of motion, Impaired flexibility, Difficulty walking, Decreased balance  Visit Diagnosis: Chronic right-sided low back pain without sciatica  Difficulty in walking, not elsewhere classified  Muscle weakness (generalized)     Problem List Patient Active Problem List   Diagnosis Date Noted  . Diabetes mellitus type 2 in obese ([place]) 11/24/2018  . Chronic right-sided low back pain without sciatica 10/24/2018    Rehmat Murtagh C. Chamika Cunanan PT, DPT 12/21/18 4:28 PM   [facility] [address] [place], [place], [address] Phone: [phone]   Fax:  [phone]  Name: Kristi Campos MRN: [id number] Date of Birth: February 15, 1951

## 2018-12-22 LAB — HM DIABETES EYE EXAM

## 2018-12-25 ENCOUNTER — Encounter: Payer: Self-pay | Admitting: Family Medicine

## 2018-12-26 ENCOUNTER — Ambulatory Visit: Payer: Medicare Other | Admitting: Physical Therapy

## 2018-12-26 ENCOUNTER — Other Ambulatory Visit: Payer: Self-pay

## 2018-12-26 ENCOUNTER — Encounter: Payer: Self-pay | Admitting: Neurology

## 2018-12-26 ENCOUNTER — Ambulatory Visit: Payer: Medicare Other | Admitting: Neurology

## 2018-12-26 VITALS — BP 140/89 | HR 79 | Temp 97.8°F | Ht 63.0 in | Wt 198.0 lb

## 2018-12-26 DIAGNOSIS — R29898 Other symptoms and signs involving the musculoskeletal system: Secondary | ICD-10-CM | POA: Diagnosis not present

## 2018-12-26 NOTE — Progress Notes (Signed)
Reason for visit: right leg weakness  Referring physician: Dr. Rogelia Rohrer is a 68 y.o. female  History of present illness:  Ms. Foister is a 68 year old right-handed black female with a history of right-sided low back pain and some discomfort down the right leg over the last for 5 years.  The patient claims that this has worsened as time has gone on.  The patient has worked as a Museum/gallery curator, she was laid off because the Covid virus in March 2020, she claims at this point she cannot go back to work because of her right leg problem and she is trying to get on disability.  The patient has pain mainly in the posterior low back on the right, she may have some occasional pain down the right leg to just below the right knee.  The patient reports some occasional numbness in this distribution as well.  The patient believes that the right leg is weak, she will occasionally fall but she has not fallen as long she has used her cane.  She is in physical therapy, the physical therapist thought that there was a neurologic issue at work and has recommended a neurology evaluation.  In 2017 she had an x-ray of the low back that showed severe degenerative disc disease at the L5-S1 level.  She reports no difficulty with neck pain or pain down the arms, she does have some right shoulder discomfort.  She denies issues controlling the bowels or the bladder.  The patient may have some back pain even while lying down but it is worse when she is standing for long period of time.  She comes here for further evaluation.  Past Medical History:  Diagnosis Date  . Diabetes mellitus without complication ([place])   . Fibroid uterus   . Hypertension     Past Surgical History:  Procedure Laterality Date  . I&D EXTREMITY Right 08/09/2013   Procedure: MINOR IRRIGATION AND DEBRIDEMENT EXTREMITY;  Surgeon: Cammie Sickle, MD;  Location: [facility];  Service: Orthopedics;  Laterality: Right;  long        wound class 4    Family History  Family history unknown: Yes    Social history:  reports that she has never smoked. She has never used smokeless tobacco. She reports that she does not drink alcohol or use drugs.  Medications:  Prior to Admission medications   Medication Sig Start Date End Date Taking? Authorizing Provider  aspirin EC 81 MG tablet Take 81 mg by mouth daily.   Yes [provider]  losartan-hydrochlorothiazide (HYZAAR) 100-12.5 MG per tablet Take 1 tablet by mouth daily.  10/08/14  Yes [provider]  metFORMIN (GLUCOPHAGE) 500 MG tablet Take 1/2 tablet  twice daily 10/24/18  Yes Wendling, Crosby Oyster, DO  pioglitazone (ACTOS) 30 MG tablet Take 1 tablet (30 mg total) by mouth daily. 12/20/18  Yes Wendling, Crosby Oyster, DO  polyethylene glycol (MIRALAX / GLYCOLAX) packet Take 17 g by mouth 2 (two) times daily. Until stooling regularly 07/03/13  Yes Hilton Sinclair, MD     No Known Allergies  ROS:  Out of a complete 14 system review of symptoms, the patient complains only of the following symptoms, and all other reviewed systems are negative.  Back pain Leg weakness Walking difficulty  Blood pressure 140/89, pulse 79, temperature 97.8 F (36.6 C), temperature source Temporal, height 5\' 3"  (1.6 m), weight 198 lb (89.8 kg).  Physical Exam  General: The  patient is alert and cooperative at the time of the examination.  The patient is moderately to markedly obese.  Eyes: Pupils are equal, round, and reactive to light. Discs are flat bilaterally.  Neck: The neck is supple, no carotid bruits are noted.  Respiratory: The respiratory examination is clear.  Cardiovascular: The cardiovascular examination reveals a regular rate and rhythm, no obvious murmurs or rubs are noted.  Neuromuscular: Internal and external rotation of the hips on both sides does not elicit pain.  Skin: Extremities are without significant edema.  Neurologic Exam  Mental  status: The patient is alert and oriented x 3 at the time of the examination. The patient has apparent normal recent and remote memory, with an apparently normal attention span and concentration ability.  Cranial nerves: Facial symmetry is present. There is good sensation of the face to pinprick and soft touch bilaterally. The strength of the facial muscles and the muscles to head turning and shoulder shrug are normal bilaterally. Speech is well enunciated, no aphasia or dysarthria is noted. Extraocular movements are full. Visual fields are full. The tongue is midline, and the patient has symmetric elevation of the soft palate. No obvious hearing deficits are noted.  Motor: The motor testing reveals 5 over 5 strength of all 4 extremities. Good symmetric motor tone is noted throughout.  Sensory: Sensory testing is intact to pinprick, soft touch, vibration sensation, and position sense on all 4 extremities, with exception of some decreased pinprick sensation of the right arm relative to the left and decreased vibration sensation of the right foot as compared to the left. No evidence of extinction is noted.  Coordination: Cerebellar testing reveals good finger-nose-finger and heel-to-shin bilaterally.  Gait and station: Gait is somewhat unusual, when the patient initiates ambulation she has a head CT with using the right leg, when she starts walking, she takes more normal strides.  She is able to perform tandem gait but is slightly unsteady.  Romberg is negative.  No drift is seen.  Reflexes: Deep tendon reflexes are symmetric and normal bilaterally, with exception that the ankle jerk reflexes are depressed bilaterally. Toes are downgoing bilaterally.   Assessment/Plan:  1.  Gait disturbance, reported right leg weakness  2.  Right low back pain, x-ray evidence of L5-S1 degenerative disc disease  This patient has unusual gait pattern, it is possible this may not be organic in nature.  The patient is  trying to get on disability for her chronic right-sided low back pain.  Prior x-rays have shown significant degenerative disc disease at the L5-S1 level.  The patient will be set up for EMG and nerve conduction study evaluation, nerve conductions will be on both legs, EMG on the right leg.  We may consider MRI of the lumbar spine in the future.  She will otherwise follow-up in 4 months.  Jill Alexanders MD 12/26/2018 10:09 AM  [facility] [address] [place], [place] [address]  Phone [phone] Fax [phone]

## 2018-12-28 ENCOUNTER — Ambulatory Visit: Payer: Medicare Other | Admitting: Physical Therapy

## 2019-01-02 ENCOUNTER — Encounter: Payer: Self-pay | Admitting: Physical Therapy

## 2019-01-02 ENCOUNTER — Ambulatory Visit: Payer: Medicare Other | Attending: Family Medicine | Admitting: Physical Therapy

## 2019-01-02 ENCOUNTER — Other Ambulatory Visit: Payer: Self-pay

## 2019-01-02 DIAGNOSIS — M6281 Muscle weakness (generalized): Secondary | ICD-10-CM | POA: Diagnosis present

## 2019-01-02 DIAGNOSIS — G8929 Other chronic pain: Secondary | ICD-10-CM | POA: Diagnosis present

## 2019-01-02 DIAGNOSIS — R262 Difficulty in walking, not elsewhere classified: Secondary | ICD-10-CM | POA: Diagnosis present

## 2019-01-02 DIAGNOSIS — M545 Low back pain: Secondary | ICD-10-CM | POA: Insufficient documentation

## 2019-01-02 NOTE — Therapy (Signed)
[place], [place], [address] Phone: [phone]   Fax:  [phone]  Physical Therapy Treatment Progress Note Reporting Period 11/07/2018 to 01/02/2019   See note below for Objective Data and Assessment of Progress/Goals.       Patient Details  Name: Kristi Campos MRN: [id number] Date of Birth: 09/01/1950 Referring Provider (PT): Shelda Pal, [place]   Encounter Date: 01/02/2019  PT End of Session - 01/02/19 1027    Visit Number  10    Number of Visits  12    Date for PT Re-Evaluation  01/07/19    Authorization Type  [employer] Medicare/BCBS    PT Start Time  1020    PT Stop Time  1059    PT Time Calculation (min)  39 min       Past Medical History:  Diagnosis Date  . Diabetes mellitus without complication ([place])   . Fibroid uterus   . Hypertension     Past Surgical History:  Procedure Laterality Date  . I&D EXTREMITY Right 08/09/2013   Procedure: MINOR IRRIGATION AND DEBRIDEMENT EXTREMITY;  Surgeon: Cammie Sickle, MD;  Location: [place];  Service: Orthopedics;  Laterality: Right;  long       wound class 4    There were no vitals filed for this visit.  Subjective Assessment - 01/02/19 1025    Subjective  I fell the other day, I got up and the right leg wouldn't move and I fell. The side of my Rt hip hurts. Exercises are pretty good but I am not practicing walking.    Currently in Pain?  Yes    Pain Score  3     Pain Location  Hip    Pain Orientation  Right;Lateral    Pain Descriptors / Indicators  Sore         [facility] PT Assessment - 01/02/19 0001      Assessment   Medical Diagnosis  R sided LBP    Referring Provider (PT)  Shelda Pal, DO      Precautions   Precautions  Fall      Restrictions   Weight Bearing Restrictions  No      Balance Screen   Has the patient fallen in the past 6 months  Yes    How many times?  --   multiple   Has the  patient had a decrease in activity level because of a fear of falling?   Yes    Is the patient reluctant to leave their home because of a fear of falling?   No      Home Film/video editor residence    Living Arrangements  Alone      Prior Function   Level of [place] Requirements  does not plan to return to work      Cognition   Overall Cognitive Status  Within Functional Limits for tasks assessed      Sensation   Additional Comments  [facility]      Strength   Overall Strength Comments  bil DF 5/5    Right Hip Flexion  5/5    Right Hip ABduction  4/5    Left Hip Flexion  5/5    Left Hip ABduction  4-/5    Right Knee Flexion  5/5    Right Knee Extension  5/5    Left Knee Flexion  5/5    Left Knee Extension  5/5      Ambulation/Gait   Pre-Gait Activities  foot flat strike, short step length, Rt foot freezing- significant difficulty turning Lt      Berg Balance Test   Sit to Stand  Able to stand without using hands and stabilize independently    Standing Unsupported  Able to stand safely 2 minutes    Sitting with Back Unsupported but Feet Supported on Floor or Stool  Able to sit safely and securely 2 minutes    Stand to Sit  Sits safely with minimal use of hands    Transfers  Able to transfer safely, minor use of hands    Standing Unsupported with Eyes Closed  Able to stand 10 seconds safely    Standing Unsupported with Feet Together  Able to place feet together independently and stand 1 minute safely    From Standing, Reach Forward with Outstretched Arm  Can reach forward >12 cm safely (5")    From Standing Position, Pick up Object from Floor  Able to pick up shoe safely and easily    From Standing Position, Turn to Look Behind Over each Shoulder  Looks behind from both sides and weight shifts well    Turn 360 Degrees  Needs close supervision or verbal cueing    Standing Unsupported, Alternately Place Feet on Step/Stool  Able to  stand independently and complete 8 steps >20 seconds    Standing Unsupported, One Foot in Front  Able to take small step independently and hold 30 seconds    Standing on One Leg  Able to lift leg independently and hold equal to or more than 3 seconds    Total Score  47                   [facility] Adult PT Treatment/Exercise - 01/02/19 0001      Lumbar Exercises: Aerobic   Nustep  5 min Ue & LE L3      Lumbar Exercises: Standing   Other Standing Lumbar Exercises  gait training around clinic             PT Education - 01/02/19 1247    Education Details  BERG, goals, HEP, POC    Person(s) Educated  Patient    Methods  Explanation    Comprehension  Verbalized understanding       PT Short Term Goals - 12/14/18 1545      PT SHORT TERM GOAL #1   Title  Pt will be independent in her HEP.    Status  Achieved      PT SHORT TERM GOAL #2   Title  Pt will be able to perform 5 times sit to stand in </= 18 seconds.    Baseline  22s on 10/15, improved from 25    Status  On-going      PT SHORT TERM GOAL #3   Title  Pt will with able to amb 500 feet with  step through gait pattern  with straight cane.    Baseline  able to demo in clinic, reports fatigue in Rt LE; shuffled Rt foot as she fatigued but able to maintain balance    Status  Achieved        PT Long Term Goals - 01/02/19 1044      PT LONG TERM GOAL #1   Title  Pt will improve her LE hip strength to grossly >/= 4/5 to improve functional mobility.  Baseline  see flowsheet      PT LONG TERM GOAL #2   Title  Pt will improve her FOTO score to </=  47% limitation.    Baseline  49% limited    Status  On-going      PT LONG TERM GOAL #3   Title  Pt will be able to amb 20 minutes for community level activities with pain </= 3/10.    Baseline  I do pretty good    Status  Achieved            Plan - 01/02/19 1249    Clinical Impression Statement  Pt continues to make significant improvements in balance and  strength- strength measures affected today by hip pain after falling. Cues continued to be provided for gait pattern but overall she has significantly improved.    PT Treatment/Interventions  ADLs/Self Care Home Management;Electrical Stimulation;Cryotherapy;Functional mobility training;Therapeutic activities;Therapeutic exercise;Balance training;Gait training;Stair training;Patient/family education;Manual techniques;Taping;Passive range of motion;Dry needling    PT Next Visit Plan  review HEP & D/C    PT Home Exercise Plan  Access Code: [email or web address]., high stepping/stomping    Consulted and Agree with Plan of Care  Patient       Patient will benefit from skilled therapeutic intervention in order to improve the following deficits and impairments:  Pain, Postural dysfunction, Decreased strength, Decreased activity tolerance, Obesity, Decreased range of motion, Impaired flexibility, Difficulty walking, Decreased balance  Visit Diagnosis: Chronic right-sided low back pain without sciatica  Difficulty in walking, not elsewhere classified  Muscle weakness (generalized)     Problem List Patient Active Problem List   Diagnosis Date Noted  . Diabetes mellitus type 2 in obese ([place]) 11/24/2018  . Chronic right-sided low back pain without sciatica 10/24/2018    Vicky Mccanless C. Anthonia Monger PT, DPT 01/02/19 12:53 PM   [facility] [address] [place], [place], [address] Phone: [phone]   Fax:  [phone]  Name: Kristi Campos MRN: [id number] Date of Birth: 06/26/1950

## 2019-01-03 ENCOUNTER — Ambulatory Visit (INDEPENDENT_AMBULATORY_CARE_PROVIDER_SITE_OTHER): Payer: Medicare Other | Admitting: Family Medicine

## 2019-01-03 ENCOUNTER — Encounter: Payer: Self-pay | Admitting: Family Medicine

## 2019-01-03 ENCOUNTER — Ambulatory Visit (HOSPITAL_BASED_OUTPATIENT_CLINIC_OR_DEPARTMENT_OTHER)
Admission: RE | Admit: 2019-01-03 | Discharge: 2019-01-03 | Disposition: A | Discharge status: Home / Self Care | Payer: Medicare Other | Source: Ambulatory Visit | Attending: Family Medicine | Admitting: Family Medicine

## 2019-01-03 VITALS — BP 118/70 | HR 86 | Temp 97.1°F | Ht 63.0 in | Wt 199.2 lb

## 2019-01-03 DIAGNOSIS — M545 Low back pain, unspecified: Secondary | ICD-10-CM

## 2019-01-03 MED ORDER — MELOXICAM 15 MG PO TABS: 15.0000 mg | ORAL_TABLET | Freq: Every day | ORAL | 0 refills | Status: DC

## 2019-01-03 NOTE — Progress Notes (Signed)
Musculoskeletal Exam  Patient: Kristi Campos DOB: 1950-08-04  DOS: 01/03/2019  SUBJECTIVE:  Chief Complaint:   Chief Complaint  Patient presents with  . Fall  . Hip Pain    right    Kristi Campos is a 68 y.o.  female for evaluation and treatment of R hip pain.   Onset:  8 days ago. Tried walking, lost balance, and fell  Forward and twisted on the floor landing on R hip/back Location: R posterior hip/low back area Character:  aching  Progression of issue:  is unchanged Associated symptoms: pain w ambulation Treatment: to date has been heat.   Neurovascular symptoms: no  ROS: Musculoskeletal/Extremities: +R hip pain  Past Medical History:  Diagnosis Date  . Diabetes mellitus without complication ([place])   . Fibroid uterus   . Hypertension     Objective: VITAL SIGNS: BP 118/70 (BP Location: Left Arm, Patient Position: Sitting, Cuff Size: Normal)   Pulse 86   Temp (!) 97.1 F (36.2 C) (Temporal)   Ht 5\' 3"  (1.6 m)   Wt 199 lb 4 oz (90.4 kg)   SpO2 98%   BMI 35.30 kg/m  Constitutional: Well formed, well developed. No acute distress. Cardiovascular: Brisk cap refill Thorax & Lungs: No accessory muscle use Musculoskeletal: Low back.   Normal active and passive range of motion of the hip Tenderness to palpation: yes, over R lower lumbar parasp msc and midline Deformity: no Ecchymosis: no Tests positive: none Tests negative: FABER, FADDIR, Stinchfield, log roll, straight leg Poor hamstring ROM b/l Neurologic: Normal sensory function. No focal deficits noted. Antalgic gait Psychiatric: Normal mood. Age appropriate judgment and insight. Alert & oriented x 3.    Assessment:  Acute right-sided low back pain without sciatica - Plan: DG Lumbar Spine Complete, meloxicam (MOBIC) 15 MG tablet  Plan: Ck XR given fall. Tylenol, ice, stretches/exercises.  F/u prn. The patient voiced understanding and agreement to the plan.   Roger Mills, DO 01/03/19  3:10  PM

## 2019-01-03 NOTE — Patient Instructions (Addendum)
We will be in touch regarding the results of your X-ray.  Heat (pad or rice pillow in microwave) over affected area, 10-15 minutes twice daily.   Ice/cold pack over area for 10-15 min twice daily.  OK to take Tylenol 1000 mg (2 extra strength tabs) or 975 mg (3 regular strength tabs) every 6 hours as needed.  Let us know if you need anything.  EXERCISES  RANGE OF MOTION (ROM) AND STRETCHING EXERCISES - Low Back Pain Most people with lower back pain will find that their symptoms get worse with excessive bending forward (flexion) or arching at the lower back (extension). The exercises that will help resolve your symptoms will focus on the opposite motion.  If you have pain, numbness or tingling which travels down into your buttocks, leg or foot, the goal of the therapy is for these symptoms to move closer to your back and eventually resolve. Sometimes, these leg symptoms will get better, but your lower back pain may worsen. This is often an indication of progress in your rehabilitation. Be very alert to any changes in your symptoms and the activities in which you participated in the 24 hours prior to the change. Sharing this information with your caregiver will allow him or her to most efficiently treat your condition. These exercises may help you when beginning to rehabilitate your injury. Your symptoms may resolve with or without further involvement from your physician, physical therapist or athletic trainer. While completing these exercises, remember:   Restoring tissue flexibility helps normal motion to return to the joints. This allows healthier, less painful movement and activity.  An effective stretch should be held for at least 30 seconds.  A stretch should never be painful. You should only feel a gentle lengthening or release in the stretched tissue. FLEXION RANGE OF MOTION AND STRETCHING EXERCISES:  STRETCH - Flexion, Single Knee to Chest   Lie on a firm bed or floor with both legs  extended in front of you.  Keeping one leg in contact with the floor, bring your opposite knee to your chest. Hold your leg in place by either grabbing behind your thigh or at your knee.  Pull until you feel a gentle stretch in your low back. Hold 30 seconds.  Slowly release your grasp and repeat the exercise with the opposite side. Repeat 2 times. Complete this exercise 3 times per week.   STRETCH - Flexion, Double Knee to Chest  Lie on a firm bed or floor with both legs extended in front of you.  Keeping one leg in contact with the floor, bring your opposite knee to your chest.  Tense your stomach muscles to support your back and then lift your other knee to your chest. Hold your legs in place by either grabbing behind your thighs or at your knees.  Pull both knees toward your chest until you feel a gentle stretch in your low back. Hold 30 seconds.  Tense your stomach muscles and slowly return one leg at a time to the floor. Repeat 2 times. Complete this exercise 3 times per week.   STRETCH - Low Trunk Rotation  Lie on a firm bed or floor. Keeping your legs in front of you, bend your knees so they are both pointed toward the ceiling and your feet are flat on the floor.  Extend your arms out to the side. This will stabilize your upper body by keeping your shoulders in contact with the floor.  Gently and slowly drop both knees  together to one side until you feel a gentle stretch in your low back. Hold for 30 seconds.  Tense your stomach muscles to support your lower back as you bring your knees back to the starting position. Repeat the exercise to the other side. Repeat 2 times. Complete this exercise at least 3 times per week.   EXTENSION RANGE OF MOTION AND FLEXIBILITY EXERCISES:  STRETCH - Extension, Prone on Elbows   Lie on your stomach on the floor, a bed will be too soft. Place your palms about shoulder width apart and at the height of your head.  Place your elbows under  your shoulders. If this is too painful, stack pillows under your chest.  Allow your body to relax so that your hips drop lower and make contact more completely with the floor.  Hold this position for 30 seconds.  Slowly return to lying flat on the floor. Repeat 2 times. Complete this exercise 3 times per week.   RANGE OF MOTION - Extension, Prone Press Ups  Lie on your stomach on the floor, a bed will be too soft. Place your palms about shoulder width apart and at the height of your head.  Keeping your back as relaxed as possible, slowly straighten your elbows while keeping your hips on the floor. You may adjust the placement of your hands to maximize your comfort. As you gain motion, your hands will come more underneath your shoulders.  Hold this position 30 seconds.  Slowly return to lying flat on the floor. Repeat 2 times. Complete this exercise 3 times per week.   RANGE OF MOTION- Quadruped, Neutral Spine   Assume a hands and knees position on a firm surface. Keep your hands under your shoulders and your knees under your hips. You may place padding under your knees for comfort.  Drop your head and point your tailbone toward the ground below you. This will round out your lower back like an angry cat. Hold this position for 30 seconds.  Slowly lift your head and release your tail bone so that your back sags into a large arch, like an old horse.  Hold this position for 30 seconds.  Repeat this until you feel limber in your low back.  Now, find your "sweet spot." This will be the most comfortable position somewhere between the two previous positions. This is your neutral spine. Once you have found this position, tense your stomach muscles to support your low back.  Hold this position for 30 seconds. Repeat 2 times. Complete this exercise 3 times per week.   STRENGTHENING EXERCISES - Low Back Sprain These exercises may help you when beginning to rehabilitate your injury. These  exercises should be done near your "sweet spot." This is the neutral, low-back arch, somewhere between fully rounded and fully arched, that is your least painful position. When performed in this safe range of motion, these exercises can be used for people who have either a flexion or extension based injury. These exercises may resolve your symptoms with or without further involvement from your physician, physical therapist or athletic trainer. While completing these exercises, remember:   Muscles can gain both the endurance and the strength needed for everyday activities through controlled exercises.  Complete these exercises as instructed by your physician, physical therapist or athletic trainer. Increase the resistance and repetitions only as guided.  You may experience muscle soreness or fatigue, but the pain or discomfort you are trying to eliminate should never worsen during these exercises.  If this pain does worsen, stop and make certain you are following the directions exactly. If the pain is still present after adjustments, discontinue the exercise until you can discuss the trouble with your caregiver.  STRENGTHENING - Deep Abdominals, Pelvic Tilt   Lie on a firm bed or floor. Keeping your legs in front of you, bend your knees so they are both pointed toward the ceiling and your feet are flat on the floor.  Tense your lower abdominal muscles to press your low back into the floor. This motion will rotate your pelvis so that your tail bone is scooping upwards rather than pointing at your feet or into the floor. With a gentle tension and even breathing, hold this position for 3 seconds. Repeat 2 times. Complete this exercise 3 times per week.   STRENGTHENING - Abdominals, Crunches   Lie on a firm bed or floor. Keeping your legs in front of you, bend your knees so they are both pointed toward the ceiling and your feet are flat on the floor. Cross your arms over your chest.  Slightly tip your  chin down without bending your neck.  Tense your abdominals and slowly lift your trunk high enough to just clear your shoulder blades. Lifting higher can put excessive stress on the lower back and does not further strengthen your abdominal muscles.  Control your return to the starting position. Repeat 2 times. Complete this exercise 3 times per week.   STRENGTHENING - Quadruped, Opposite UE/LE Lift   Assume a hands and knees position on a firm surface. Keep your hands under your shoulders and your knees under your hips. You may place padding under your knees for comfort.  Find your neutral spine and gently tense your abdominal muscles so that you can maintain this position. Your shoulders and hips should form a rectangle that is parallel with the floor and is not twisted.  Keeping your trunk steady, lift your right hand no higher than your shoulder and then your left leg no higher than your hip. Make sure you are not holding your breath. Hold this position for 30 seconds.  Continuing to keep your abdominal muscles tense and your back steady, slowly return to your starting position. Repeat with the opposite arm and leg. Repeat 2 times. Complete this exercise 3 times per week.   STRENGTHENING - Abdominals and Quadriceps, Straight Leg Raise   Lie on a firm bed or floor with both legs extended in front of you.  Keeping one leg in contact with the floor, bend the other knee so that your foot can rest flat on the floor.  Find your neutral spine, and tense your abdominal muscles to maintain your spinal position throughout the exercise.  Slowly lift your straight leg off the floor about 6 inches for a count of 3, making sure to not hold your breath.  Still keeping your neutral spine, slowly lower your leg all the way to the floor. Repeat this exercise with each leg 2 times. Complete this exercise 3 times per week.  POSTURE AND BODY MECHANICS CONSIDERATIONS - Low Back Sprain Keeping correct  posture when sitting, standing or completing your activities will reduce the stress put on different body tissues, allowing injured tissues a chance to heal and limiting painful experiences. The following are general guidelines for improved posture.  While reading these guidelines, remember:  The exercises prescribed by your provider will help you have the flexibility and strength to maintain correct postures.  The correct posture  provides the best environment for your joints to work. All of your joints have less wear and tear when properly supported by a spine with good posture. This means you will experience a healthier, less painful body.  Correct posture must be practiced with all of your activities, especially prolonged sitting and standing. Correct posture is as important when doing repetitive low-stress activities (typing) as it is when doing a single heavy-load activity (lifting).  RESTING POSITIONS Consider which positions are most painful for you when choosing a resting position. If you have pain with flexion-based activities (sitting, bending, stooping, squatting), choose a position that allows you to rest in a less flexed posture. You would want to avoid curling into a fetal position on your side. If your pain worsens with extension-based activities (prolonged standing, working overhead), avoid resting in an extended position such as sleeping on your stomach. Most people will find more comfort when they rest with their spine in a more neutral position, neither too rounded nor too arched. Lying on a non-sagging bed on your side with a pillow between your knees, or on your back with a pillow under your knees will often provide some relief. Keep in mind, being in any one position for a prolonged period of time, no matter how correct your posture, can still lead to stiffness.  PROPER SITTING POSTURE In order to minimize stress and discomfort on your spine, you must sit with correct posture.  Sitting with good posture should be effortless for a healthy body. Returning to good posture is a gradual process. Many people can work toward this most comfortably by using various supports until they have the flexibility and strength to maintain this posture on their own. When sitting with proper posture, your ears will fall over your shoulders and your shoulders will fall over your hips. You should use the back of the chair to support your upper back. Your lower back will be in a neutral position, just slightly arched. You may place a small pillow or folded towel at the base of your lower back for  support.  When working at a desk, create an environment that supports good, upright posture. Without extra support, muscles tire, which leads to excessive strain on joints and other tissues. Keep these recommendations in mind:  CHAIR:  A chair should be able to slide under your desk when your back makes contact with the back of the chair. This allows you to work closely.  The chair's height should allow your eyes to be level with the upper part of your monitor and your hands to be slightly lower than your elbows.  BODY POSITION  Your feet should make contact with the floor. If this is not possible, use a foot rest.  Keep your ears over your shoulders. This will reduce stress on your neck and low back.  INCORRECT SITTING POSTURES  If you are feeling tired and unable to assume a healthy sitting posture, do not slouch or slump. This puts excessive strain on your back tissues, causing more damage and pain. Healthier options include:  Using more support, like a lumbar pillow.  Switching tasks to something that requires you to be upright or walking.  Talking a brief walk.  Lying down to rest in a neutral-spine position.  PROLONGED STANDING WHILE SLIGHTLY LEANING FORWARD  When completing a task that requires you to lean forward while standing in one place for a long time, place either foot up on a  stationary 2-4 inch high object  to help maintain the best posture. When both feet are on the ground, the lower back tends to lose its slight inward curve. If this curve flattens (or becomes too large), then the back and your other joints will experience too much stress, tire more quickly, and can cause pain.  CORRECT STANDING POSTURES Proper standing posture should be assumed with all daily activities, even if they only take a few moments, like when brushing your teeth. As in sitting, your ears should fall over your shoulders and your shoulders should fall over your hips. You should keep a slight tension in your abdominal muscles to brace your spine. Your tailbone should point down to the ground, not behind your body, resulting in an over-extended swayback posture.   INCORRECT STANDING POSTURES  Common incorrect standing postures include a forward head, locked knees and/or an excessive swayback. WALKING Walk with an upright posture. Your ears, shoulders and hips should all line-up.  PROLONGED ACTIVITY IN A FLEXED POSITION When completing a task that requires you to bend forward at your waist or lean over a low surface, try to find a way to stabilize 3 out of 4 of your limbs. You can place a hand or elbow on your thigh or rest a knee on the surface you are reaching across. This will provide you more stability, so that your muscles do not tire as quickly. By keeping your knees relaxed, or slightly bent, you will also reduce stress across your lower back. CORRECT LIFTING TECHNIQUES  DO :  Assume a wide stance. This will provide you more stability and the opportunity to get as close as possible to the object which you are lifting.  Tense your abdominals to brace your spine. Bend at the knees and hips. Keeping your back locked in a neutral-spine position, lift using your leg muscles. Lift with your legs, keeping your back straight.  Test the weight of unknown objects before attempting to lift them.   Try to keep your elbows locked down at your sides in order get the best strength from your shoulders when carrying an object.     Always ask for help when lifting heavy or awkward objects. INCORRECT LIFTING TECHNIQUES DO NOT:   Lock your knees when lifting, even if it is a small object.  Bend and twist. Pivot at your feet or move your feet when needing to change directions.  Assume that you can safely pick up even a paperclip without proper posture.

## 2019-01-04 ENCOUNTER — Other Ambulatory Visit: Payer: Self-pay

## 2019-01-04 ENCOUNTER — Encounter: Payer: Self-pay | Admitting: Physical Therapy

## 2019-01-04 ENCOUNTER — Ambulatory Visit: Payer: Medicare Other | Admitting: Physical Therapy

## 2019-01-04 DIAGNOSIS — M6281 Muscle weakness (generalized): Secondary | ICD-10-CM

## 2019-01-04 DIAGNOSIS — G8929 Other chronic pain: Secondary | ICD-10-CM

## 2019-01-04 DIAGNOSIS — R262 Difficulty in walking, not elsewhere classified: Secondary | ICD-10-CM

## 2019-01-04 DIAGNOSIS — M545 Low back pain: Secondary | ICD-10-CM | POA: Diagnosis not present

## 2019-01-04 NOTE — Therapy (Signed)
[place], [place], [address] Phone: [phone]   Fax:  [phone]  Physical Therapy Treatment/Discharge  Patient Details  Name: Kristi Campos MRN: [id number] Date of Birth: 27-Jan-1951 Referring Provider (PT): Shelda Pal, [place]   Encounter Date: 01/04/2019  PT End of Session - 01/04/19 1505    Visit Number  11    Number of Visits  12    Date for PT Re-Evaluation  01/07/19    Authorization Type  [employer] Medicare/BCBS    PT Start Time  1502    PT Stop Time  1540    PT Time Calculation (min)  38 min    Activity Tolerance  Patient tolerated treatment well    Behavior During Therapy  [facility] for tasks assessed/performed       Past Medical History:  Diagnosis Date  . Diabetes mellitus without complication ([place])   . Fibroid uterus   . Hypertension     Past Surgical History:  Procedure Laterality Date  . I&D EXTREMITY Right 08/09/2013   Procedure: MINOR IRRIGATION AND DEBRIDEMENT EXTREMITY;  Surgeon: Cammie Sickle, MD;  Location: [facility];  Service: Orthopedics;  Laterality: Right;  long       wound class 4    There were no vitals filed for this visit.  Subjective Assessment - 01/04/19 1542    Subjective  My leg swelled up last night. Hip is still a little sore after falling.         [facility] PT Assessment - 01/04/19 0001      Assessment   Medical Diagnosis  R sided LBP    Referring Provider (PT)  Shelda Pal, DO      Precautions   Precautions  Fall      Restrictions   Weight Bearing Restrictions  No      Balance Screen   Has the patient fallen in the past 6 months  Yes    How many times?  --   multiple   Has the patient had a decrease in activity level because of a fear of falling?   Yes    Is the patient reluctant to leave their home because of a fear of falling?   No      Home Film/video editor residence    Living  Arrangements  Alone      Prior Function   Level of [place] Requirements  does not plan to return to work      Cognition   Overall Cognitive Status  Within Functional Limits for tasks assessed      Sensation   Additional Comments  [facility]      Strength   Right Hip Flexion  5/5    Right Hip ABduction  4/5    Left Hip Flexion  5/5    Left Hip ABduction  4/5    Right Knee Flexion  5/5    Right Knee Extension  5/5    Left Knee Flexion  5/5    Left Knee Extension  5/5                   [facility] Adult PT Treatment/Exercise - 01/04/19 0001      Lumbar Exercises: Standing   Heel Raises  20 reps    Heel Raises Limitations  at counter    Other Standing Lumbar Exercises  gait training- heel-toe, turning/stomping  Other Standing Lumbar Exercises  marching at counter, 3-way hip at counter      Lumbar Exercises: Supine   Pelvic Tilt Limitations  post pelvic tilt, cues to decrease push through [place] Limitations  green band     Bridge  10 reps             PT Education - 01/04/19 1543    Education Details  importance of continued HEP, goals, gait pattern    Person(s) Educated  Patient    Methods  Explanation;Demonstration;Tactile cues;Verbal cues;Handout    Comprehension  Verbalized understanding;Returned demonstration;Verbal cues required;Tactile cues required       PT Short Term Goals - 01/04/19 1534      PT SHORT TERM GOAL #1   Title  Pt will be independent in her HEP.    Status  Achieved      PT SHORT TERM GOAL #2   Title  Pt will be able to perform 5 times sit to stand in </= 18 seconds.    Baseline  best time 22s, improved from 25s but did not meet goal    Status  Not Met      PT SHORT TERM GOAL #3   Title  Pt will with able to amb 500 feet with  step through gait pattern  with straight cane.    Baseline  able to demo in clinic, reports fatigue in Rt LE; shuffled Rt foot as she fatigued but able to maintain balance     Status  Achieved        PT Long Term Goals - 01/04/19 1536      PT LONG TERM GOAL #1   Title  Pt will improve her LE hip strength to grossly >/= 4/5 to improve functional mobility.    Baseline  see flowsheet    Status  Achieved      PT LONG TERM GOAL #2   Title  Pt will improve her FOTO score to </=  47% limitation.    Baseline  49% limited, improved but goal not met    Status  Not Met      PT LONG TERM GOAL #3   Title  Pt will be able to amb 20 minutes for community level activities with pain </= 3/10.    Baseline  I do pretty good    Status  Achieved            Plan - 01/04/19 1540    Clinical Impression Statement  Pt has met all of her goals at this time and is d/c to independent program. Is able to ambulate around clinic without LOB or need for assistance. Provided with handout of final HEP and pt was able to demonstrate with proper form. Encouraged her to continue performing HEP and importance of doing so. Will contact us with any further questions.    PT Treatment/Interventions  ADLs/Self Care Home Management;Electrical Stimulation;Cryotherapy;Functional mobility training;Therapeutic activities;Therapeutic exercise;Balance training;Gait training;Stair training;Patient/family education;Manual techniques;Taping;Passive range of motion;Dry needling    PT Home Exercise Plan  PPT, supine clam, marching, hip 3-way, heel raises    Consulted and Agree with Plan of Care  Patient       Patient will benefit from skilled therapeutic intervention in order to improve the following deficits and impairments:  Pain, Postural dysfunction, Decreased strength, Decreased activity tolerance, Obesity, Decreased range of motion, Impaired flexibility, Difficulty walking, Decreased balance  Visit Diagnosis: Chronic right-sided low back pain without sciatica  Difficulty in  walking, not elsewhere classified  Muscle weakness (generalized)     Problem List Patient Active Problem List    Diagnosis Date Noted  . Diabetes mellitus type 2 in obese ([place]) 11/24/2018  . Chronic right-sided low back pain without sciatica 10/24/2018   PHYSICAL THERAPY DISCHARGE SUMMARY  Visits from Start of Care: 11  Current functional level related to goals / functional outcomes: See above   Remaining deficits: See above   Education / Equipment: Anatomy of condition, POC, HEP, exercise form/rationale  Plan: Patient agrees to discharge.  Patient goals were partially met. Patient is being discharged due to being pleased with the current functional level.  ?????      Rexine Gowens C. Deondra Labrador PT, DPT 01/04/19 3:44 PM   [facility] [address] [place], [place], [address] Phone: [phone]   Fax:  [phone]  Name: Keiran Sias MRN: [id number] Date of Birth: 03/21/1950

## 2019-01-13 ENCOUNTER — Emergency Department (HOSPITAL_COMMUNITY): Payer: Medicare Other

## 2019-01-13 ENCOUNTER — Emergency Department (HOSPITAL_COMMUNITY)
Admission: EM | Admit: 2019-01-13 | Discharge: 2019-01-13 | Disposition: A | Discharge status: Home / Self Care | Payer: Medicare Other | Attending: Emergency Medicine | Admitting: Emergency Medicine

## 2019-01-13 ENCOUNTER — Other Ambulatory Visit: Payer: Self-pay

## 2019-01-13 ENCOUNTER — Emergency Department (HOSPITAL_BASED_OUTPATIENT_CLINIC_OR_DEPARTMENT_OTHER): Payer: Medicare Other

## 2019-01-13 ENCOUNTER — Encounter (HOSPITAL_COMMUNITY): Payer: Self-pay | Admitting: Emergency Medicine

## 2019-01-13 DIAGNOSIS — I10 Essential (primary) hypertension: Secondary | ICD-10-CM | POA: Insufficient documentation

## 2019-01-13 DIAGNOSIS — Z79899 Other long term (current) drug therapy: Secondary | ICD-10-CM | POA: Diagnosis not present

## 2019-01-13 DIAGNOSIS — M25551 Pain in right hip: Secondary | ICD-10-CM | POA: Insufficient documentation

## 2019-01-13 DIAGNOSIS — M7989 Other specified soft tissue disorders: Secondary | ICD-10-CM

## 2019-01-13 DIAGNOSIS — E119 Type 2 diabetes mellitus without complications: Secondary | ICD-10-CM | POA: Insufficient documentation

## 2019-01-13 DIAGNOSIS — R1030 Lower abdominal pain, unspecified: Secondary | ICD-10-CM | POA: Diagnosis present

## 2019-01-13 DIAGNOSIS — Z7984 Long term (current) use of oral hypoglycemic drugs: Secondary | ICD-10-CM | POA: Insufficient documentation

## 2019-01-13 DIAGNOSIS — Z7982 Long term (current) use of aspirin: Secondary | ICD-10-CM | POA: Diagnosis not present

## 2019-01-13 DIAGNOSIS — R109 Unspecified abdominal pain: Secondary | ICD-10-CM | POA: Diagnosis not present

## 2019-01-13 LAB — COMPREHENSIVE METABOLIC PANEL
ALT: 20 U/L (ref 0–44)
AST: 21 U/L (ref 15–41)
Albumin: 3.6 g/dL (ref 3.5–5.0)
Alkaline Phosphatase: 62 U/L (ref 38–126)
Anion gap: 11 (ref 5–15)
BUN: 22 mg/dL (ref 8–23)
CO2: 24 mmol/L (ref 22–32)
Calcium: 9.6 mg/dL (ref 8.9–10.3)
Chloride: 103 mmol/L (ref 98–111)
Creatinine, Ser: 0.87 mg/dL (ref 0.44–1.00)
GFR calc Af Amer: >60 mL/min (ref >60)
GFR calc non Af Amer: >60 mL/min (ref >60)
Glucose, Bld: 125 mg/dL — ABNORMAL HIGH (ref 70–99)
Potassium: 4 mmol/L (ref 3.5–5.1)
Sodium: 138 mmol/L (ref 135–145)
Total Bilirubin: 0.7 mg/dL (ref 0.3–1.2)
Total Protein: 7.6 g/dL (ref 6.5–8.1)

## 2019-01-13 LAB — URINALYSIS, ROUTINE W REFLEX MICROSCOPIC
Bilirubin Urine: NEGATIVE
Glucose, UA: NEGATIVE mg/dL
Hgb urine dipstick: NEGATIVE
Ketones, ur: NEGATIVE mg/dL
Leukocytes,Ua: NEGATIVE
Nitrite: NEGATIVE
Protein, ur: NEGATIVE mg/dL
Specific Gravity, Urine: 1.012 (ref 1.005–1.030)
pH: 6 (ref 5.0–8.0)

## 2019-01-13 LAB — CBC
HCT: 37.3 % (ref 36.0–46.0)
Hemoglobin: 12.1 g/dL (ref 12.0–15.0)
MCH: 30.6 pg (ref 26.0–34.0)
MCHC: 32.4 g/dL (ref 30.0–36.0)
MCV: 94.2 fL (ref 80.0–100.0)
Platelets: 245 10*3/uL (ref 150–400)
RBC: 3.96 MIL/uL (ref 3.87–5.11)
RDW: 14.9 % (ref 11.5–15.5)
WBC: 5.2 10*3/uL (ref 4.0–10.5)
nRBC: 0 % (ref 0.0–0.2)

## 2019-01-13 LAB — WET PREP, GENITAL
Clue Cells Wet Prep HPF POC: NONE SEEN
Sperm: NONE SEEN
Trich, Wet Prep: NONE SEEN
Yeast Wet Prep HPF POC: NONE SEEN

## 2019-01-13 LAB — LIPASE, BLOOD: Lipase: 24 U/L (ref 11–51)

## 2019-01-13 MED ORDER — SODIUM CHLORIDE 0.9% FLUSH: 3.0000 mL | Freq: Once | INTRAVENOUS | Status: DC

## 2019-01-13 MED ORDER — LIDOCAINE 5 % EX PTCH
1.0000 | MEDICATED_PATCH | CUTANEOUS | Status: DC
  Administered 2019-01-13: 1 via TRANSDERMAL
  Filled 2019-01-13: qty 1

## 2019-01-13 MED ORDER — LIDOCAINE 5 % EX PTCH: 1.0000 | MEDICATED_PATCH | CUTANEOUS | 0 refills | Status: DC

## 2019-01-13 MED ORDER — IOHEXOL 300 MG/ML  SOLN
100.0000 mL | Freq: Once | INTRAMUSCULAR | Status: AC | PRN
  Administered 2019-01-13: 100 mL via INTRAVENOUS

## 2019-01-13 NOTE — ED Provider Notes (Signed)
[facility] EMERGENCY DEPARTMENT Provider Note   CSN: [id number] Arrival date & time: 01/13/19  0807     History   Chief Complaint Chief Complaint  Patient presents with   Abdominal Pain   Fall   Hip Pain    HPI Kristi Campos is a 68 y.o. female.      Abdominal Pain Pain location:  Suprapubic Pain quality: aching   Pain radiates to:  Does not radiate Pain severity:  Mild Onset quality:  Gradual Duration:  3 days Timing:  Constant Progression:  Waxing and waning Chronicity:  New Context comment:  Patient is also had diarrhea for the past several days.  He also been noticing swelling right lower extremity that is worse in the morning and improved by the afternoon and evening hours.  She reports that she fell several weeks ago Relieved by:  None tried Worsened by:  Nothing Ineffective treatments:  None tried Associated symptoms: no anorexia, no chest pain, no chills, no cough, no dysuria, no fever, no hematuria, no shortness of breath, no sore throat and no vomiting   Risk factors: not pregnant   Fall Associated symptoms include abdominal pain. Pertinent negatives include no chest pain and no shortness of breath.  Hip Pain Associated symptoms include abdominal pain. Pertinent negatives include no chest pain and no shortness of breath.  Patient reports that she fell several weeks ago without hitting her head or her neck or losing consciousness.  She has been ambulatory since that time without difficulty.  She states that she was having some pain in her left shoulder and elbow area since the fall but has been able to move her arm through the full range of motion without difficulty and has been able to use her left upper extremity since that time.  Past Medical History:  Diagnosis Date   Diabetes mellitus without complication ([place])    Fibroid uterus    Hypertension     Patient Active Problem List   Diagnosis Date Noted   Diabetes mellitus  type 2 in obese ([place]) 11/24/2018   Chronic right-sided low back pain without sciatica 10/24/2018    Past Surgical History:  Procedure Laterality Date   I&D EXTREMITY Right 08/09/2013   Procedure: MINOR IRRIGATION AND DEBRIDEMENT EXTREMITY;  Surgeon: Cammie Sickle, MD;  Location: [facility];  Service: Orthopedics;  Laterality: Right;  long       wound class 4     OB History    Gravida  0   Para  0   Term  0   Preterm  0   AB  0   Living  0     SAB  0   TAB  0   Ectopic  0   Multiple  0   Live Births               Home Medications    Prior to Admission medications   Medication Sig Start Date End Date Taking? Authorizing Provider  aspirin EC 81 MG tablet Take 81 mg by mouth daily.    [provider]  losartan-hydrochlorothiazide (HYZAAR) 100-12.5 MG per tablet Take 1 tablet by mouth daily.  10/08/14   [provider]  meloxicam (MOBIC) 15 MG tablet Take 1 tablet (15 mg total) by mouth daily. 01/03/19   Shelda Pal, DO  metFORMIN (GLUCOPHAGE) 500 MG tablet Take 1/2 tablet  twice daily 10/24/18   Shelda Pal, DO  pioglitazone (ACTOS) 30  MG tablet Take 1 tablet (30 mg total) by mouth daily. 12/20/18   Wendling, Crosby Oyster, DO  polyethylene glycol [facility] / GLYCOLAX) packet Take 17 g by mouth 2 (two) times daily. Until stooling regularly 07/03/13   Hilton Sinclair, MD    Family History Family History  Family history unknown: Yes    Social History Social History   Tobacco Use   Smoking status: Never Smoker   Smokeless tobacco: Never Used  Substance Use Topics   Alcohol use: No    Alcohol/week: 0.0 standard drinks   Drug use: No     Allergies   Patient has no known allergies.   Review of Systems Review of Systems  Constitutional: Negative for chills and fever.  HENT: Negative for ear pain and sore throat.   Eyes: Negative for pain and visual disturbance.  Respiratory: Negative  for cough and shortness of breath.   Cardiovascular: Positive for leg swelling (Right leg). Negative for chest pain and palpitations.  Gastrointestinal: Positive for abdominal pain. Negative for anorexia and vomiting.  Genitourinary: Negative for dysuria and hematuria.  Musculoskeletal: Negative for arthralgias, back pain, joint swelling, neck pain and neck stiffness.  Skin: Negative for color change and rash.  Neurological: Negative for seizures and syncope.  All other systems reviewed and are negative.    Physical Exam Updated Vital Signs BP (!) 148/89    Pulse 69    Temp 98.5 F (36.9 C) (Oral)    Resp 20    SpO2 100%   Physical Exam Vitals signs and nursing note reviewed.  Constitutional:      General: She is not in acute distress.    Appearance: She is well-developed. She is obese.  HENT:     Head: Normocephalic and atraumatic.  Eyes:     Conjunctiva/sclera: Conjunctivae normal.  Neck:     Musculoskeletal: Neck supple.  Cardiovascular:     Rate and Rhythm: Normal rate and regular rhythm.     Heart sounds: No murmur.     Comments: I do not appreciate any edema of either lower extremity. No calf TTP, no ankle swelling noted Pulmonary:     Effort: Pulmonary effort is normal. No respiratory distress.     Breath sounds: Normal breath sounds.  Abdominal:     General: There is no distension.     Palpations: Abdomen is soft.     Tenderness: There is abdominal tenderness in the suprapubic area. There is no right CVA tenderness or left CVA tenderness.  Genitourinary:    Comments: Normal appearing external female genitalia. No lesions or other abnormalities noted. No CMT or cervical abnormality noted. No discharge or bleeding. Musculoskeletal:     Left shoulder: She exhibits pain. She exhibits normal range of motion, no tenderness, no deformity and no laceration.     Left elbow: Normal. She exhibits normal range of motion, no swelling, no effusion, no deformity and no laceration.  No tenderness found.     Right hip: She exhibits swelling. She exhibits normal range of motion, normal strength, no tenderness, no bony tenderness, no deformity and no laceration.  Skin:    General: Skin is warm and dry.     Capillary Refill: Capillary refill takes less than 2 seconds.  Neurological:     General: No focal deficit present.     Mental Status: She is alert and oriented to person, place, and time.     Comments: Strength 5/5 in all extremities, no sensation deficits, no coordination abnormality,  ambulatory in the ED without difficulty with a cane which is her baseline.       ED Treatments / Results  Labs (all labs ordered are listed, but only abnormal results are displayed) Labs Reviewed  COMPREHENSIVE METABOLIC PANEL - Abnormal; Notable for the following components:      Result Value   Glucose, Bld 125 (*)    All other components within normal limits  URINALYSIS, ROUTINE W REFLEX MICROSCOPIC - Abnormal; Notable for the following components:   Color, Urine STRAW (*)    All other components within normal limits  WET PREP, GENITAL  LIPASE, BLOOD  CBC  GC/CHLAMYDIA PROBE AMP ([place]) NOT AT [facility]    EKG None  Radiology Dg Elbow 2 Views Left  Result Date: 01/13/2019 CLINICAL DATA:  Fall, pain EXAM: LEFT SHOULDER - 2+ VIEW; LEFT ELBOW - 2 VIEW COMPARISON:  None. FINDINGS: No fracture or dislocation of the left shoulder. Moderate acromioclavicular arthrosis. The glenohumeral joint is preserved. The partially imaged left chest is unremarkable. No fracture or dislocation of the left elbow. Mild humeroulnar arthrosis. No elbow joint effusion. Soft tissues are unremarkable. IMPRESSION: 1. No fracture or dislocation of the left shoulder. Moderate acromioclavicular arthrosis. The glenohumeral joint is preserved. 2. No fracture or dislocation of the left elbow. Mild humeroulnar arthrosis. No elbow joint effusion. Electronically Signed   By: Eddie Candle M.D.   On: 01/13/2019  10:39   Dg Shoulder Left  Result Date: 01/13/2019 CLINICAL DATA:  Fall, pain EXAM: LEFT SHOULDER - 2+ VIEW; LEFT ELBOW - 2 VIEW COMPARISON:  None. FINDINGS: No fracture or dislocation of the left shoulder. Moderate acromioclavicular arthrosis. The glenohumeral joint is preserved. The partially imaged left chest is unremarkable. No fracture or dislocation of the left elbow. Mild humeroulnar arthrosis. No elbow joint effusion. Soft tissues are unremarkable. IMPRESSION: 1. No fracture or dislocation of the left shoulder. Moderate acromioclavicular arthrosis. The glenohumeral joint is preserved. 2. No fracture or dislocation of the left elbow. Mild humeroulnar arthrosis. No elbow joint effusion. Electronically Signed   By: Eddie Candle M.D.   On: 01/13/2019 10:39   Dg Hip Unilat With Pelvis 2-3 Views Right  Result Date: 01/13/2019 CLINICAL DATA:  Right hip pain. Fell 2 weeks ago. EXAM: DG HIP (WITH OR WITHOUT PELVIS) 2-3V RIGHT COMPARISON:  None FINDINGS: Normal appearing right hip. No fracture or dislocation. Lower lumbar spine degenerative changes. IMPRESSION: Normal appearing right hip. Lower lumbar spine degenerative changes. Electronically Signed   By: Claudie Revering M.D.   On: 01/13/2019 10:36    Procedures Procedures (including critical care time)  Medications Ordered in ED Medications  sodium chloride flush (NS) 0.9 % injection 3 mL (0 mLs Intravenous Hold 01/13/19 0838)  iohexol (OMNIPAQUE) 300 MG/ML solution 100 mL (100 mLs Intravenous Contrast Given 01/13/19 1048)     Initial Impression / Assessment and Plan / ED Course  I have reviewed the triage vital signs and the nursing notes.  Pertinent labs & imaging results that were available during my care of the patient were reviewed by me and considered in my medical decision making (see chart for details).        Patient is a 68 year old female with past medical history and physical exam as above presents emergency department for  evaluation of lower abdominal pain for the last 3 to 4 days as well as right lower extremity pain and swelling predominantly noted in the mornings for the past 3 to 4 days. I doubt this  is due to CHF as she has no history of same, swelling is in the upper portion of the leg and only in the RLE. Also she has been having no other symptoms of CHF.  She states that she fell several weeks ago but did not have any pain in her hip and has been able to walk since that time.  No symptoms in her leg until the past several days.  She states that she has been having pain in her left shoulder and elbow area since the fall but has been able to move her arm and use it.  Labs were obtained in the emergency department which demonstrated no emergent findings.  No leukocytosis.  No anemia.  Mildly hyperglycemic at 125.  LFTs within normal limits..  CT scan of the abdomen is performed to evaluate for her lower abdominal pain.  X-rays of her left upper arm and right hip were obtained for evaluation of her pain.  Imaging demonstrated no fractures on x-ray.Marland Kitchen  DVT study was also ordered to evaluate patient's right lower extremity swelling.  This study demonstrated no DVT.  I also performed a pelvic exam as documented above which was unremarkable. Wet prep negative.   Patient was reevaluated multiple times in the emergency department while she is here.  She was offered pain medication at the time of my initial evaluation but declined. She was given a lidoderm patch for pain and discharged with recommendations to follow up with PCP as soon as possible to continue outpatient workup as necessary if symptoms persist. She was discharged in stable condition.  Final Clinical Impressions(s) / ED Diagnoses   Final diagnoses:  None    ED Discharge Orders    None       Romona Curls, MD 01/13/19 1601    Noemi Chapel, MD 01/14/19 [phone]

## 2019-01-13 NOTE — ED Provider Notes (Signed)
I saw and evaluated the patient, reviewed the resident's note and I agree with the findings and plan.  Pertinent History: 2-3 weeks ago had a fall =- since has had some developing R hip pain and R leg swelling, pain and sewlling more in the  Morning - walking in the morning is trouble - at the end of the day is better - no pain or swelling.  Then back in the morning.  Also having some L arm pain upper arm with movement.  SP abd pain as well.  Pertinent Exam findings: R leg with minimal edema.  Abdomen - ttp in the lower abdomen.  Normal heart and lung exams.  Hip xray US of the leg Scan of the abdomen - looking for cause of ttp.  I was personally present and directly supervised the following procedures:  Medical evaluation  I personally interpreted the EKG as well as the resident and agree with the interpretation on the resident's chart.  Final diagnoses:  Right hip pain  Abdominal pain, unspecified abdominal location      Noemi Chapel, MD 01/14/19 [phone]

## 2019-01-13 NOTE — ED Notes (Signed)
Pt taken to CT.

## 2019-01-13 NOTE — ED Provider Notes (Signed)
  Physical Exam  BP 90/74   Pulse 93   Temp 98.5 F (36.9 C) (Oral)   Resp 20   SpO2 97%    MDM  Patient will be a likely discharge. Currently pending on her wet prep for chief complaint of abdominal pain.  Patient reassessed at the time of handoff and very well-appearing.  Wet prep was negative.  Will discharge with plan to follow-up with PCP within a week.  Care of patient discussed with the supervising attending.      Julianne Rice, MD 01/13/19 1608    Noemi Chapel, MD 01/14/19 [phone]

## 2019-01-13 NOTE — ED Triage Notes (Signed)
C/o lower abd pain x 1 week.  Denies nausea, vomiting, diarrhea, and urinary complaints.  States she fell 3-4 weeks ago and has R hip pain that radiates down R leg with R leg swelling.

## 2019-01-13 NOTE — Discharge Instructions (Addendum)
Your pelvic labs were all negative. Please follow up with your PCP within a week.

## 2019-01-13 NOTE — ED Notes (Signed)
Got patient undress on the monitor patient is resting with call bell in reach 

## 2019-01-16 LAB — GC/CHLAMYDIA PROBE AMP (~~LOC~~) NOT AT ARMC
Chlamydia: NEGATIVE
Neisseria Gonorrhea: NEGATIVE

## 2019-01-17 ENCOUNTER — Ambulatory Visit (INDEPENDENT_AMBULATORY_CARE_PROVIDER_SITE_OTHER): Payer: Medicare Other | Admitting: Family Medicine

## 2019-01-17 ENCOUNTER — Encounter: Payer: Self-pay | Admitting: Family Medicine

## 2019-01-17 ENCOUNTER — Other Ambulatory Visit: Payer: Self-pay

## 2019-01-17 VITALS — BP 136/84 | HR 88 | Temp 96.6°F | Ht 63.0 in | Wt 200.2 lb

## 2019-01-17 DIAGNOSIS — M25551 Pain in right hip: Secondary | ICD-10-CM | POA: Diagnosis not present

## 2019-01-17 MED ORDER — METHYLPREDNISOLONE ACETATE 40 MG/ML IJ SUSP
40.0000 mg | Freq: Once | INTRAMUSCULAR | Status: AC
  Administered 2019-01-17: 40 mg via INTRA_ARTICULAR

## 2019-01-17 NOTE — Patient Instructions (Signed)
Ice/cold pack over area for 10-15 min twice daily.  Continue doing the stretches/exercises.  OK to take Tylenol 1000 mg (2 extra strength tabs) or 975 mg (3 regular strength tabs) every 6 hours as needed.  Let us know if you need anything.

## 2019-01-17 NOTE — Progress Notes (Signed)
Musculoskeletal Exam  Patient: Kristi Campos DOB: 1950-05-17  DOS: 01/17/2019  SUBJECTIVE:  Chief Complaint:   Chief Complaint  Patient presents with  . Hip Pain    right    Kristi Campos is a 68 y.o.  female for evaluation and treatment of R hip pain.   Onset:  3 weeks ago. Had sustained a fall Location: outer R hip Character:  sharp  Progression of issue:  is unchanged Associated symptoms: Affecting gait; no bruising, redness or swelling Treatment: to date has been rest, ice and home exercises.   Neurovascular symptoms: no  ROS: Musculoskeletal/Extremities: +R hip pain  Past Medical History:  Diagnosis Date  . Diabetes mellitus without complication ([place])   . Fibroid uterus   . Hypertension     Objective: VITAL SIGNS: BP 136/84 (BP Location: Left Arm, Patient Position: Sitting, Cuff Size: Large)   Pulse 88   Temp (!) 96.6 F (35.9 C) (Temporal)   Ht 5\' 3"  (1.6 m)   Wt 200 lb 4 oz (90.8 kg)   SpO2 96%   BMI 35.47 kg/m  Constitutional: Well formed, well developed. No acute distress. Thorax & Lungs: No accessory muscle use Musculoskeletal: R hip.   Normal active range of motion: yes.   Normal passive range of motion: yes Tenderness to palpation: +ttp over R greater troch Deformity: no Ecchymosis: no Neurologic: Normal sensory function.  Psychiatric: Normal mood. Age appropriate judgment and insight. Alert & oriented x 3.    Procedure note: Greater trochanteric bursa injection Verbal consent obtained. The area of interest was palpated and demarcated with an otoscope speculum. It was cleaned with an alcohol swab. Freeze spray was used. A 27 g needle was inserted at a perpendicular angle through the area of interested. The plunger was withdrawn to ensure our placement was not in a vessel. 2 mL of 1% lidocaine without epi and 40 mg of Depomedrol was injected. A bandaid was placed. The patient tolerated the procedure well.  There were no complications  noted.   Assessment:  Right hip pain - Plan: methylPREDNISolone acetate (DEPO-MEDROL) injection 40 mg, PR DRAIN/INJECT LARGE JOINT/BURSA  Plan: Orders as above. Ice, Tylenol, stretches/exercises.  F/u prn. The patient voiced understanding and agreement to the plan.   [place], DO 01/17/19  3:04 PM

## 2019-01-24 ENCOUNTER — Ambulatory Visit: Payer: Medicare Other | Admitting: Family Medicine

## 2019-01-29 ENCOUNTER — Encounter: Payer: Self-pay | Admitting: Obstetrics and Gynecology

## 2019-01-29 ENCOUNTER — Ambulatory Visit: Payer: Medicare Other | Admitting: Obstetrics and Gynecology

## 2019-01-29 ENCOUNTER — Other Ambulatory Visit: Payer: Self-pay

## 2019-01-29 VITALS — BP 155/85 | HR 83 | Wt 204.0 lb

## 2019-01-29 DIAGNOSIS — R102 Pelvic and perineal pain: Secondary | ICD-10-CM

## 2019-01-29 NOTE — Progress Notes (Signed)
Pt states she was recently in the ED with abd/lower pelvic pain.  States she was told her fibroids are bigger and to questions to have them removed.

## 2019-01-29 NOTE — Progress Notes (Signed)
GYNECOLOGY OFFICE FOLLOW UP NOTE  History:  68 y.o. G0P0000 here today for follow up for ED. Was seen in ED for stomach pain. States it is about a 3/10, happens when she needs to void. Says it is achy, not too bothersome, does not interfere with her every day life. Pain improves when she voids. Reports h/o fibroids. States he was told in ED that her fibroids have gotten bigger and she should see GYN for potential removal. No vaginal bleeding. Also went to ED because she fell. No injuries from fall. Has seen PCP since fall.   Pap normal 04/2017  Past Medical History:  Diagnosis Date  . Diabetes mellitus without complication ([place])   . Fibroid uterus   . Hypertension     Past Surgical History:  Procedure Laterality Date  . I&D EXTREMITY Right 08/09/2013   Procedure: MINOR IRRIGATION AND DEBRIDEMENT EXTREMITY;  Surgeon: Cammie Sickle, MD;  Location: [facility];  Service: Orthopedics;  Laterality: Right;  long       wound class 4     Current Outpatient Medications:  .  aspirin EC 81 MG tablet, Take 81 mg by mouth daily., Disp: , Rfl:  .  Iron-Vitamins (GERITOL PO), Take 1 tablet by mouth 2 (two) times a week., Disp: , Rfl:  .  lidocaine (LIDODERM) 5 %, Place 1 patch onto the skin daily. Remove & Discard patch within 12 hours or as directed by MD, Disp: 30 patch, Rfl: 0 .  losartan-hydrochlorothiazide (HYZAAR) 100-12.5 MG per tablet, Take 1 tablet by mouth daily. , Disp: , Rfl:  .  meloxicam (MOBIC) 15 MG tablet, Take 1 tablet (15 mg total) by mouth daily. (Patient taking differently: Take 15 mg by mouth daily as needed for pain. ), Disp: 30 tablet, Rfl: 0 .  metFORMIN (GLUCOPHAGE) 500 MG tablet, Take 1/2 tablet  twice daily, Disp: 30 tablet, Rfl: 3 .  pioglitazone (ACTOS) 30 MG tablet, Take 1 tablet (30 mg total) by mouth daily., Disp: 90 tablet, Rfl: 1 .  simvastatin (ZOCOR) 10 MG tablet, Take 10 mg by mouth at bedtime., Disp: , Rfl:   The following portions of the  patient's history were reviewed and updated as appropriate: allergies, current medications, past family history, past medical history, past social history, past surgical history and problem list.   Review of Systems:  Pertinent items noted in HPI and remainder of comprehensive ROS otherwise negative.   Objective:  Physical Exam BP (!) 155/85   Pulse 83   Wt 204 lb (92.5 kg)   BMI 36.14 kg/m  CONSTITUTIONAL: Well-developed, well-nourished female in no acute distress.  HENT:  Normocephalic, atraumatic. External right and left ear normal. Oropharynx is clear and moist EYES: Conjunctivae and EOM are normal. Pupils are equal, round, and reactive to light. No scleral icterus.  NECK: Normal range of motion, supple, no masses SKIN: Skin is warm and dry. No rash noted. Not diaphoretic. No erythema. No pallor. NEUROLOGIC: Alert and oriented to person, place, and time. Normal reflexes, muscle tone coordination. No cranial nerve deficit noted. PSYCHIATRIC: Normal mood and affect. Normal behavior. Normal judgment and thought content. CARDIOVASCULAR: Normal heart rate noted RESPIRATORY: Effort normal, no problems with respiration noted ABDOMEN: Soft, no distention noted.   PELVIC: deferred MUSCULOSKELETAL: Normal range of motion. No edema noted.  Assessment & Plan:   1. Pelvic pain H/o fibroids (review of EMR shows several 1 cm fibroids noted in 2012) - pelvic pain improved with urination, has  not occurred since - no pain on abdominal exam today - with no ongoing pain/bleeding and no changes in reproductive tract noted on [place], would not recommend [place] at this time, reviewed risks/benefits of pelvic exam (discomfort with pelvic exam when she is not currently having any pain and no other symptoms), patient declines today, will have her come back for annual and pap in 04/2019    Routine preventative health maintenance measures emphasized. Please refer to After Visit Summary for other counseling  recommendations.   Return in about 3 months (around 04/29/2019) for annual.  Total face-to-face time with patient: 20 minutes. Over 50% of encounter was spent on counseling and coordination of care.  Feliz Beam, M.D. Attending Center for [employer] [facility])

## 2019-02-05 ENCOUNTER — Ambulatory Visit (INDEPENDENT_AMBULATORY_CARE_PROVIDER_SITE_OTHER): Payer: Medicare Other | Admitting: Neurology

## 2019-02-05 ENCOUNTER — Ambulatory Visit: Payer: Medicare Other | Admitting: Neurology

## 2019-02-05 ENCOUNTER — Encounter: Payer: Self-pay | Admitting: Neurology

## 2019-02-05 ENCOUNTER — Other Ambulatory Visit: Payer: Self-pay

## 2019-02-05 ENCOUNTER — Other Ambulatory Visit: Payer: Self-pay | Admitting: Family Medicine

## 2019-02-05 DIAGNOSIS — G8929 Other chronic pain: Secondary | ICD-10-CM

## 2019-02-05 DIAGNOSIS — R29898 Other symptoms and signs involving the musculoskeletal system: Secondary | ICD-10-CM

## 2019-02-05 DIAGNOSIS — M545 Low back pain, unspecified: Secondary | ICD-10-CM

## 2019-02-05 DIAGNOSIS — M5431 Sciatica, right side: Secondary | ICD-10-CM

## 2019-02-05 MED ORDER — MELOXICAM 15 MG PO TABS: 15.0000 mg | ORAL_TABLET | Freq: Every day | ORAL | 0 refills | Status: DC

## 2019-02-05 NOTE — Progress Notes (Signed)
Please refer to EMG and nerve conduction procedure note.  

## 2019-02-05 NOTE — Procedures (Signed)
     HISTORY:  Kristi Campos is a 68 year old patient with a history of low back pain and pain down the right leg to below the knee.  The patient is being evaluated for a possible lumbar radiculopathy.  She has an associated problem with walking, she uses a cane for ambulation.  NERVE CONDUCTION STUDIES:  Nerve conduction studies were performed on both lower extremities. The distal motor latencies and motor amplitudes for the peroneal and posterior tibial nerves were within normal limits. The nerve conduction velocities for these nerves were also normal. The sensory latencies for the peroneal and sural nerves were within normal limits. The F wave latencies for the posterior tibial nerves were within normal limits.   EMG STUDIES:  EMG study was performed on the right lower extremity:  The tibialis anterior muscle reveals 2 to 4K motor units with full recruitment. No fibrillations or positive waves were seen. The peroneus tertius muscle reveals 2 to 4K motor units with full recruitment. No fibrillations or positive waves were seen. The medial gastrocnemius muscle reveals 1 to 3K motor units with full recruitment. No fibrillations or positive waves were seen. The vastus lateralis muscle reveals 2 to 4K motor units with full recruitment. No fibrillations or positive waves were seen. The iliopsoas muscle reveals 2 to 4K motor units with full recruitment. No fibrillations or positive waves were seen. The biceps femoris muscle (long head) reveals 2 to 4K motor units with full recruitment. No fibrillations or positive waves were seen. The lumbosacral paraspinal muscles were tested at 3 levels, and revealed no abnormalities of insertional activity at all 3 levels tested. There was good relaxation.   IMPRESSION:  Nerve conduction studies done on both lower extremities were within normal limits.  No evidence of a neuropathy is seen.  EMG of the right lower extremity was unremarkable, no evidence of  an overlying lumbar radiculopathy was seen.  Jill Alexanders MD 02/05/2019 2:22 PM  [facility] [address] [place], [place] [address]  Phone [phone] Fax [phone]

## 2019-02-05 NOTE — Progress Notes (Addendum)
The patient comes in today for EMG and nerve conduction study evaluation.  Nerve conductions on both legs are unremarkable, EMG of the right leg is normal.  The patient does have facet joint arthritis at L5-S1 level, she reports pain down the right leg to below the knee.  The pain could be related to the facet joint arthritis, she claims that she has had an epidural steroid injection recently without benefit.  I will get her set up for MRI of the lumbar spine.         Nerve / Sites Muscle Latency Ref. Amplitude Ref. Rel Amp Segments Distance Velocity Ref. Area    ms ms mV mV %  cm m/s m/s mVms  R Peroneal - EDB     Ankle EDB 4.8 ?6.5 5.7 ?2.0 100 Ankle - EDB 9   17.2     Fib head EDB 10.5  5.0  88.1 Fib head - Ankle 27 47 ?44 15.9     Pop fossa EDB 12.6  4.7  94.9 Pop fossa - Fib head 10 48 ?44 15.3         Pop fossa - Ankle      L Peroneal - EDB     Ankle EDB 6.0 ?6.5 3.5 ?2.0 100 Ankle - EDB 9   12.7     Fib head EDB 11.5  3.1  88.4 Fib head - Ankle 27 49 ?44 12.5     Pop fossa EDB 13.5  3.0  95.1 Pop fossa - Fib head 10 49 ?44 12.2         Pop fossa - Ankle      R Tibial - AH     Ankle AH 5.3 ?5.8 8.7 ?4.0 100 Ankle - AH 9   38.8     Pop fossa AH 14.1  4.9  56.5 Pop fossa - Ankle 36 41 ?41 22.1  L Tibial - AH     Ankle AH 5.8 ?5.8 11.5 ?4.0 100 Ankle - AH 9   39.1     Pop fossa AH 14.2  8.9  77.5 Pop fossa - Ankle 36 43 ?41 31.1             SNC    Nerve / Sites Rec. Site Peak Lat Ref.  Amp Ref. Segments Distance    ms ms V V  cm  R Sural - Ankle (Calf)     Calf Ankle 3.9 ?4.4 20 ?6 Calf - Ankle 14  L Sural - Ankle (Calf)     Calf Ankle 3.8 ?4.4 30 ?6 Calf - Ankle 14  R Superficial peroneal - Ankle     Lat leg Ankle 4.1 ?4.4 13 ?6 Lat leg - Ankle 14  L Superficial peroneal - Ankle     Lat leg Ankle 4.4 ?4.4 13 ?6 Lat leg - Ankle 14             F  Wave    Nerve F Lat Ref.   ms ms  R Tibial - AH 67.0 ?56.0  L Tibial - AH 63.3 ?56.0

## 2019-03-06 ENCOUNTER — Other Ambulatory Visit: Payer: Self-pay | Admitting: Family Medicine

## 2019-03-06 DIAGNOSIS — M545 Low back pain, unspecified: Secondary | ICD-10-CM

## 2019-03-06 MED ORDER — MELOXICAM 15 MG PO TABS: 15.0000 mg | ORAL_TABLET | Freq: Every day | ORAL | 0 refills | Status: DC

## 2019-03-10 ENCOUNTER — Other Ambulatory Visit: Payer: Self-pay

## 2019-03-10 ENCOUNTER — Ambulatory Visit
Admission: RE | Admit: 2019-03-10 | Discharge: 2019-03-10 | Disposition: A | Discharge status: Home / Self Care | Payer: Medicare Other | Source: Ambulatory Visit | Attending: Neurology | Admitting: Neurology

## 2019-03-10 DIAGNOSIS — R29898 Other symptoms and signs involving the musculoskeletal system: Secondary | ICD-10-CM

## 2019-03-10 DIAGNOSIS — M5431 Sciatica, right side: Secondary | ICD-10-CM | POA: Diagnosis not present

## 2019-03-12 ENCOUNTER — Telehealth: Payer: Self-pay | Admitting: Neurology

## 2019-03-12 NOTE — Telephone Encounter (Signed)
I called the patient.  The patient does have multilevel facet joint arthritis and degenerative disc disease, possible left L5 nerve root compression but the patient mainly has pain down the right leg.  The patient likely has pain related to her facet joint arthritis.  She will be seen by Dr. Nelva Bush in the near future, she will have an epidural steroid injection done.   MRI lumbar 03/12/19:  IMPRESSION: This MRI of the lumbar spine without contrast shows the following: 1.  At L3-L4, there is 2 to 3 mm anterolisthesis associated with severe facet hypertrophy and disc protrusion.  There does not appear to be any nerve root compression.  The central canal is mildly narrowed but not enough to be considered spinal stenosis. 2.  At L4-L5 there is a small left paramedian disc herniation and other degenerative changes causing moderately severe left lateral recess stenosis with potential for left L5 nerve root compression. 3.  At L5-S1, there are degenerative changes more to the left causing moderate left foraminal and moderate left lateral recess stenosis but no nerve root compression. 4.  Milder degenerative changes at L1-L2 and L2-L3 that do not cause spinal stenosis or nerve root compression

## 2019-04-03 ENCOUNTER — Other Ambulatory Visit: Payer: Self-pay | Admitting: Family Medicine

## 2019-04-03 DIAGNOSIS — M545 Low back pain, unspecified: Secondary | ICD-10-CM

## 2019-04-03 MED ORDER — MELOXICAM 15 MG PO TABS: 15.0000 mg | ORAL_TABLET | Freq: Every day | ORAL | 0 refills | Status: DC

## 2019-04-27 ENCOUNTER — Ambulatory Visit: Payer: Medicare Other | Admitting: Family Medicine

## 2019-05-04 ENCOUNTER — Other Ambulatory Visit: Payer: Self-pay | Admitting: Family Medicine

## 2019-05-04 DIAGNOSIS — M545 Low back pain, unspecified: Secondary | ICD-10-CM

## 2019-05-04 MED ORDER — MELOXICAM 15 MG PO TABS: 15.0000 mg | ORAL_TABLET | Freq: Every day | ORAL | 0 refills | Status: DC

## 2019-05-17 ENCOUNTER — Ambulatory Visit: Payer: Medicare Other | Admitting: Neurology

## 2019-05-17 ENCOUNTER — Encounter: Payer: Self-pay | Admitting: Neurology

## 2019-05-17 ENCOUNTER — Other Ambulatory Visit: Payer: Self-pay

## 2019-05-17 VITALS — BP 126/73 | HR 84 | Temp 97.2°F | Wt 211.5 lb

## 2019-05-17 DIAGNOSIS — M545 Low back pain: Secondary | ICD-10-CM

## 2019-05-17 DIAGNOSIS — M6281 Muscle weakness (generalized): Secondary | ICD-10-CM | POA: Diagnosis not present

## 2019-05-17 DIAGNOSIS — G8929 Other chronic pain: Secondary | ICD-10-CM | POA: Diagnosis not present

## 2019-05-17 NOTE — Progress Notes (Signed)
Reason for visit: Right-sided sciatica  Kristi Campos is an 69 y.o. female  History of present illness:  Kristi Campos is a 69 year old right-handed black female with a history of back pain and right leg discomfort.  The patient has undergone EMG and nerve conduction study that was relatively unremarkable, there was no evidence of a nerve root impingement on the right side by MRI of the lumbar spine.  The patient had potential for a left L5 nerve root impingement.  The patient has been seen by Dr. Nelva Bush, she underwent an epidural steroid injection about 2 weeks ago and seemed to get good benefit with it, she claims that she is not having any pain at this time.  She is in physical therapy for her walking, she still feels that the right leg is weak.  She uses a cane for ambulation.  She did fall 2 weeks ago when she slipped on something on the floor.  She did not sustain significant injury.  She is sleeping fairly well at night.  Past Medical History:  Diagnosis Date  . Diabetes mellitus without complication ([place])   . Fibroid uterus   . Hypertension     Past Surgical History:  Procedure Laterality Date  . I & D EXTREMITY Right 08/09/2013   Procedure: MINOR IRRIGATION AND DEBRIDEMENT EXTREMITY;  Surgeon: Cammie Sickle, MD;  Location: [facility];  Service: Orthopedics;  Laterality: Right;  long       wound class 4    Family History  Family history unknown: Yes    Social history:  reports that she has never smoked. She has never used smokeless tobacco. She reports that she does not drink alcohol or use drugs.   No Known Allergies  Medications:  Prior to Admission medications   Medication Sig Start Date End Date Taking? Authorizing Provider  amLODipine (NORVASC) 5 MG tablet Take 5 mg by mouth daily. 01/19/19  Yes [provider]  aspirin EC 81 MG tablet Take 81 mg by mouth daily.   Yes [provider]  gabapentin (NEURONTIN) 100 MG capsule Take 100  mg by mouth 2 (two) times daily. 02/05/19  Yes [provider]  HYDROcodone-acetaminophen (NORCO) 5-325 MG tablet Norco 5 mg-325 mg tablet  Take 1 tablet every 6 hours by oral route as needed.   Yes [provider]  Iron-Vitamins (GERITOL PO) Take 1 tablet by mouth 2 (two) times a week.   Yes [provider]  JARDIANCE 10 MG TABS tablet Take 10 mg by mouth daily. 02/16/19  Yes [provider]  losartan-hydrochlorothiazide (HYZAAR) 100-12.5 MG per tablet Take 1 tablet by mouth daily.  10/08/14  Yes [provider]  meloxicam (MOBIC) 15 MG tablet Take 1 tablet (15 mg total) by mouth daily. 05/04/19  Yes Shelda Pal, DO  metFORMIN (GLUCOPHAGE) 500 MG tablet Take 1/2 tablet  twice daily 10/24/18  Yes Wendling, Crosby Oyster, DO  pioglitazone (ACTOS) 30 MG tablet Take 1 tablet (30 mg total) by mouth daily. 12/20/18  Yes Shelda Pal, DO  simvastatin (ZOCOR) 10 MG tablet Take 10 mg by mouth at bedtime. 01/01/19  Yes [provider]  tiZANidine (ZANAFLEX) 4 MG capsule Take 4 mg by mouth at bedtime as needed. 02/15/19  Yes [provider]  traMADol (ULTRAM) 50 MG tablet Take 50 mg by mouth every 4 (four) hours as needed. 03/09/19  Yes [provider]    ROS:  Out of a complete  14 system review of symptoms, the patient complains only of the following symptoms, and all other reviewed systems are negative.  Leg weakness Walking difficulty  Blood pressure 126/73, pulse 84, temperature (!) 97.2 F (36.2 C), weight 211 lb 8 oz (95.9 kg).  Physical Exam  General: The patient is alert and cooperative at the time of the examination.  The patient is markedly obese.  Skin: No significant peripheral edema is noted.   Neurologic Exam  Mental status: The patient is alert and oriented x 3 at the time of the examination. The patient has apparent normal recent and remote memory, with an apparently normal attention span and  concentration ability.   Cranial nerves: Facial symmetry is present. Speech is normal, no aphasia or dysarthria is noted. Extraocular movements are full. Visual fields are full.  Motor: The patient has good strength in all 4 extremities, with exception of 4/5 strength with hip flexion bilaterally and mild weakness with the deltoid muscles bilaterally.  Strength with neck flexion and extension is normal.  Sensory examination: Soft touch sensation is symmetric on the face, arms, and legs.  Coordination: The patient has good finger-nose-finger and heel-to-shin bilaterally.  Gait and station: The patient has a slightly wide-based gait, she can walk independently but normally uses a cane.  Tandem gait is slightly unsteady.  Romberg is negative.  Reflexes: Deep tendon reflexes are symmetric, but are depressed.   MRI lumbar 03/12/19:  IMPRESSION: This MRI of the lumbar spine without contrast shows the following: 1. At L3-L4, there is 2 to 3 mm anterolisthesis associated with severe facet hypertrophy and disc protrusion. There does not appear to be any nerve root compression. The central canal is mildly narrowed but not enough to be considered spinal stenosis. 2. At L4-L5 there is a small left paramedian disc herniation and other degenerative changes causing moderately severe left lateral recess stenosis with potential for left L5 nerve root compression. 3. At L5-S1, there are degenerative changes more to the left causing moderate left foraminal and moderate left lateral recess stenosis but no nerve root compression. 4. Milder degenerative changes at L1-L2 and L2-L3 that do not cause spinal stenosis or nerve root compression  * MRI scan images were reviewed online. I agree with the written report.    Assessment/Plan:  1.  Right-sided sciatica, improved with epidural  2.  Mild proximal muscle weakness, all 4 extremities  The patient does appear to have some weakness of the arms and  legs bilaterally proximally.  The patient is on a cholesterol-lowering agent, EMG done previously did not show evidence of a myopathic disorder.  We will check blood work today.  The patient has done much better with her pain, I have instructed her that she will need to continue to work on leg strengthening exercises at home after she completes physical therapy.  She will follow-up here in 6 months.  Jill Alexanders MD 05/17/2019 9:02 AM  [facility] [address] [place], [place] [address]  Phone [phone] Fax [phone]

## 2019-05-21 LAB — B. BURGDORFI ANTIBODIES: Lyme IgG/IgM Ab: <0.91 {ISR} (ref 0.00–0.90)

## 2019-05-21 LAB — ANGIOTENSIN CONVERTING ENZYME: Angio Convert Enzyme: 49 U/L (ref 14–82)

## 2019-05-21 LAB — CK: Total CK: 77 U/L (ref 32–182)

## 2019-05-21 LAB — ANA W/REFLEX: Anti Nuclear Antibody (ANA): NEGATIVE

## 2019-05-21 LAB — ACETYLCHOLINE RECEPTOR, BINDING: AChR Binding Ab, Serum: <0.03 nmol/L (ref 0.00–0.24)

## 2019-05-21 LAB — SEDIMENTATION RATE: Sed Rate: 30 mm/hr (ref 0–40)

## 2019-05-22 ENCOUNTER — Telehealth: Payer: Self-pay

## 2019-05-22 NOTE — Telephone Encounter (Signed)
Advised pt message below and - "labs were normal per RN-Katrina" pt stated no further questions only FYI

## 2019-05-22 NOTE — Telephone Encounter (Signed)
-----   Message from Kathrynn Ducking, MD sent at 05/21/2019  5:00 PM EDT -----  The blood work results are unremarkable. Please call the patient. ----- Message ----- From: Lavone Neri Lab Results In Sent: 05/18/2019   7:37 AM EDT To: Kathrynn Ducking, MD

## 2019-05-22 NOTE — Telephone Encounter (Signed)
LEft vm for patient to call back about lab work results.

## 2019-06-28 ENCOUNTER — Ambulatory Visit: Payer: Medicare Other | Attending: Family Medicine | Admitting: Family Medicine

## 2019-06-28 ENCOUNTER — Encounter: Payer: Self-pay | Admitting: Family Medicine

## 2019-06-28 ENCOUNTER — Other Ambulatory Visit: Payer: Self-pay

## 2019-06-28 DIAGNOSIS — E1165 Type 2 diabetes mellitus with hyperglycemia: Secondary | ICD-10-CM

## 2019-06-28 DIAGNOSIS — G8929 Other chronic pain: Secondary | ICD-10-CM

## 2019-06-28 DIAGNOSIS — E785 Hyperlipidemia, unspecified: Secondary | ICD-10-CM

## 2019-06-28 DIAGNOSIS — I10 Essential (primary) hypertension: Secondary | ICD-10-CM

## 2019-06-28 DIAGNOSIS — M5441 Lumbago with sciatica, right side: Secondary | ICD-10-CM

## 2019-06-28 DIAGNOSIS — Z79899 Other long term (current) drug therapy: Secondary | ICD-10-CM

## 2019-06-28 DIAGNOSIS — E1169 Type 2 diabetes mellitus with other specified complication: Secondary | ICD-10-CM | POA: Diagnosis not present

## 2019-06-28 NOTE — Progress Notes (Signed)
Virtual Visit via Telephone Note  I connected with Kristi Campos on 06/28/19 at  8:50 AM EDT by telephone and verified that I am speaking with the correct person using two identifiers.   I discussed the limitations, risks, security and privacy concerns of performing an evaluation and management service by telephone and the availability of in person appointments. I also discussed with the patient that there may be a patient responsible charge related to this service. The patient expressed understanding and agreed to proceed.  Patient Location: Home Provider Location: [facility] Others participating in call: call initiated by Emilio Aspen, CMA who then transferred the call to me   History of Present Illness:        69 year old female new to the practice who reports medical history of type 2 diabetes, hypertension, chronic low back pain with radiation and chronic right-sided lower extremity weakness.  She reports that her blood sugars in the mornings are running between 125-127.  She has had no hypoglycemic episodes.  She denies any issues with numbness or tingling in her hands or feet related to her diabetes and no visual impairment.  She uses a cane to assist with ambulation and has had one fall over the past 12 months.  She did not have any injury related to her fall.  She is currently attending physical therapy to help with her gait and balance.  Her right-sided weakness/lumbar radiculopathy is followed by neurology, Dr. Jannifer Franklin.  She reports that her blood pressures remain well controlled and she has had no headaches or dizziness related to her blood pressure.  She reports having colonoscopy done last year with removal of 1 polyp by Dr. Collene Mares.  She reports no history of blood in the stool or black stools.  Her only surgery has been incision and drainage of an infected area on her foot.  Family history is significant for mother having bipolar disorder, 1 son who died from prostate cancer and  patient has 1 daughter with hypertension.  She denies any current issues with chest pain or palpitations, no shortness of breath or cough, no abdominal pain-no nausea or vomiting.  Overall she feels well.  She reports that she does not need refills of medications at this time.  She has not had COVID-19 vaccination as she states that " that shot is killing people" and she feels that since she has diabetes and her blood pressure that the shot would be more harmful for her.   Past Medical History:  Diagnosis Date  . Diabetes mellitus without complication ([place])   . Fibroid uterus   . Hypertension     Past Surgical History:  Procedure Laterality Date  . I & D EXTREMITY Right 08/09/2013   Procedure: MINOR IRRIGATION AND DEBRIDEMENT EXTREMITY;  Surgeon: Latanja Lehenbauer Sickle, MD;  Location: [facility];  Service: Orthopedics;  Laterality: Right;  long       wound class 4    Family History  Family history unknown: Yes    Social History   Tobacco Use  . Smoking status: Never Smoker  . Smokeless tobacco: Never Used  Substance Use Topics  . Alcohol use: No    Alcohol/week: 0.0 standard drinks  . Drug use: No     No Known Allergies     Observations/Objective: No vital signs or physical exam conducted as visit was done via telephone  Assessment and Plan: 1. Uncontrolled type 2 diabetes mellitus with hyperglycemia (HCC) On review of chart, patient's last  hemoglobin A1c was 9.9 indicating poorly controlled diabetes as hemoglobin A1c goal should be around 7.  Patient agrees to come into the office to have blood work including hemoglobin A1c in follow-up of her diabetes.  She does report that her blood sugar levels have improved since her last hemoglobin A1c.  She denies any complications related to her diabetes.  I had a long discussion with the patient regarding the COVID-19 vaccination at today's visit and discussed with the patient that since she has diabetes and high blood pressure  she is actually at increased risk of complications including death if she were to contract COVID-19.  Discussed that Pfizer vaccine and maternal vaccine have both been effective with minimal side effects and she should consider having the COVID-19 vaccination.  She reports that she has had a diabetic eye exam within the past 12 months.  2. Essential hypertension On review of chart, patient's blood pressure appears to be well controlled on her current medications including losartan hydrochlorothiazide and amlodipine which she will continue at this time.  3. Chronic right-sided low back pain with right-sided sciatica Patient is being followed by neurology for sciatica and right-sided weakness and her most recent note from her neurologist, Dr. Jannifer Franklin was reviewed and discussed with the patient.  She is currently attending physical therapy to help decrease her fall risk.  4.  Hyperlipidemia associated with type 2 diabetes; 5.  Long-term use of medications Patient will return in 2 to 3 weeks for lab visit to have comprehensive metabolic panel in follow-up of her type 2 diabetes, hyperlipidemia for which she is currently on simvastatin 10 mg daily.  She will also have comprehensive metabolic panel in follow-up of long-term use of medications for treatment of diabetes, hypertension and hyperlipidemia/statin therapy.  Follow Up Instructions:Return in about 4 months (around 10/28/2019) for Diabetes/hypertension/chronic issues-labs in 2 to 3 weeks.    I discussed the assessment and treatment plan with the patient. The patient was provided an opportunity to ask questions and all were answered. The patient agreed with the plan and demonstrated an understanding of the instructions.   The patient was advised to call back or seek an in-person evaluation if the symptoms worsen or if the condition fails to improve as anticipated.  I provided 15 minutes of non-face-to-face time during this encounter.   Antony Blackbird, MD

## 2019-06-28 NOTE — Progress Notes (Signed)
New patient

## 2019-07-09 ENCOUNTER — Ambulatory Visit (HOSPITAL_COMMUNITY)
Admission: EM | Admit: 2019-07-09 | Discharge: 2019-07-09 | Disposition: A | Discharge status: Home / Self Care | Payer: Medicare Other

## 2019-07-09 ENCOUNTER — Other Ambulatory Visit: Payer: Self-pay

## 2019-07-09 ENCOUNTER — Encounter (HOSPITAL_COMMUNITY): Payer: Self-pay

## 2019-07-09 DIAGNOSIS — M545 Low back pain, unspecified: Secondary | ICD-10-CM

## 2019-07-09 DIAGNOSIS — W19XXXA Unspecified fall, initial encounter: Secondary | ICD-10-CM

## 2019-07-09 DIAGNOSIS — M79601 Pain in right arm: Secondary | ICD-10-CM | POA: Diagnosis not present

## 2019-07-09 NOTE — Discharge Instructions (Addendum)
Believe this is all muscle strain from the fall.  You can keep taking the Aleve as needed and apply heat to the area.  Follow-up as needed

## 2019-07-09 NOTE — ED Triage Notes (Signed)
Pt is here with back/ right arm pain that started after a fall yesterday morning . Pt has taken Advil to relieve discomfort.

## 2019-07-09 NOTE — ED Provider Notes (Signed)
[facility]    CSN: [id number] Arrival date & time: 07/09/19  0805      History   Chief Complaint Chief Complaint  Patient presents with  . Back Pain  . Arm Pain    HPI Kristi Campos is a 69 y.o. female.   Patient is a 69 year old female with past medical history of diabetes, hypertension.  She presents today with right arm pain and right lower back pain after fall yesterday.  Reporting that she slipped and caught herself with her right arm on the sink and fell down to back area.  She did not hit her head or lose any consciousness.  She took Aleve at 5:00 this morning with some leaf of her pain.  Denies any associated numbness, tingling, weakness, loss of bowel or bladder function.  ROS per HPI     Past Medical History:  Diagnosis Date  . Diabetes mellitus without complication ([facility])   . Fibroid uterus   . Hypertension     Patient Active Problem List   Diagnosis Date Noted  . Diabetes mellitus type 2 in obese ([facility]) 11/24/2018  . Chronic right-sided low back pain without sciatica 10/24/2018    Past Surgical History:  Procedure Laterality Date  . I & D EXTREMITY Right 08/09/2013   Procedure: MINOR IRRIGATION AND DEBRIDEMENT EXTREMITY;  Surgeon: Cammie Sickle, MD;  Location: [facility];  Service: Orthopedics;  Laterality: Right;  long       wound class 4    OB History    Gravida  0   Para  0   Term  0   Preterm  0   AB  0   Living  0     SAB  0   TAB  0   Ectopic  0   Multiple  0   Live Births               Home Medications    Prior to Admission medications   Medication Sig Start Date End Date Taking? Authorizing Provider  ACCU-CHEK GUIDE test strip See admin instructions. 07/04/19   [provider]  amLODipine (NORVASC) 5 MG tablet Take 5 mg by mouth daily. 01/19/19   [provider]  aspirin EC 81 MG tablet Take 81 mg by mouth daily.    [provider]  atorvastatin (LIPITOR) 10 MG  tablet Take 10 mg by mouth daily. 05/27/19   [provider]  chlorhexidine (PERIDEX) 0.12 % solution See admin instructions. 04/26/19   [provider]  gabapentin (NEURONTIN) 100 MG capsule Take 100 mg by mouth 2 (two) times daily. 02/05/19   [provider]  HYDROcodone-acetaminophen (NORCO) 5-325 MG tablet Norco 5 mg-325 mg tablet  Take 1 tablet every 6 hours by oral route as needed.    [provider]  Iron-Vitamins (GERITOL PO) Take 1 tablet by mouth 2 (two) times a week.    [provider]  JARDIANCE 10 MG TABS tablet Take 10 mg by mouth daily. 02/16/19   [provider]  losartan-hydrochlorothiazide (HYZAAR) 100-12.5 MG per tablet Take 1 tablet by mouth daily.  10/08/14   [provider]  meloxicam (MOBIC) 15 MG tablet Take 1 tablet (15 mg total) by mouth daily. 05/04/19   Shelda Pal, DO  meloxicam (MOBIC) 7.5 MG tablet Take 7.5 mg by mouth 2 (two) times daily as needed. 06/06/19   [provider]  metFORMIN (GLUCOPHAGE) 500 MG tablet Take 1/2 tablet  twice daily 10/24/18   Shelda Pal, DO  pioglitazone (ACTOS) 30 MG tablet Take 1 tablet (30 mg total) by mouth daily. 12/20/18   Shelda Pal, DO  simvastatin (ZOCOR) 10 MG tablet Take 10 mg by mouth at bedtime. 01/01/19   [provider]  tiZANidine (ZANAFLEX) 4 MG capsule Take 4 mg by mouth at bedtime as needed. 02/15/19   [provider]  traMADol (ULTRAM) 50 MG tablet Take 50 mg by mouth every 4 (four) hours as needed. 03/09/19   [provider]    Family History Family History  Problem Relation Age of Onset  . Bipolar disorder Mother   . Prostate cancer Other     Social History Social History   Tobacco Use  . Smoking status: Never Smoker  . Smokeless tobacco: Never Used  Substance Use Topics  . Alcohol use: No    Alcohol/week: 0.0 standard drinks  . Drug use: No     Allergies   Patient has no known  allergies.   Review of Systems Review of Systems   Physical Exam Triage Vital Signs ED Triage Vitals  Enc Vitals Group     BP 07/09/19 0828 136/82     Pulse Rate 07/09/19 0828 72     Resp 07/09/19 0828 19     Temp 07/09/19 0828 98.4 F (36.9 C)     Temp Source 07/09/19 0828 Oral     SpO2 07/09/19 0828 98 %     Weight 07/09/19 0832 215 lb 3.2 oz (97.6 kg)     Height --      Head Circumference --      Peak Flow --      Pain Score --      Pain Loc --      Pain Edu? --      Excl. in [place]? --    No data found.  Updated Vital Signs BP 136/82 (BP Location: Left Arm)   Pulse 72   Temp 98.4 F (36.9 C) (Oral)   Resp 19   Wt 215 lb 3.2 oz (97.6 kg)   SpO2 98%   BMI 38.12 kg/m   Visual Acuity Right Eye Distance:   Left Eye Distance:   Bilateral Distance:    Right Eye Near:   Left Eye Near:    Bilateral Near:     Physical Exam Vitals and nursing note reviewed.  Constitutional:      General: She is not in acute distress.    Appearance: Normal appearance. She is not ill-appearing, toxic-appearing or diaphoretic.  HENT:     Head: Normocephalic.     Nose: Nose normal.     Mouth/Throat:     Pharynx: Oropharynx is clear.  Pulmonary:     Effort: Pulmonary effort is normal.  Musculoskeletal:        General: Normal range of motion.       Arms:     Cervical back: Normal range of motion.       Back:     Comments: Mildly tender to palpation  Skin:    General: Skin is warm and dry.     Findings: No rash.  Neurological:     Mental Status: She is alert.  Psychiatric:        Mood and Affect: Mood normal.      UC Treatments / Results  Labs (all labs ordered are listed, but only abnormal results are displayed) Labs Reviewed - No data to display  EKG  Radiology No results found.  Procedures Procedures (including critical care time)  Medications Ordered in UC Medications - No data to display  Initial Impression / Assessment and Plan / UC Course  I have  reviewed the triage vital signs and the nursing notes.  Pertinent labs & imaging results that were available during my care of the patient were reviewed by me and considered in my medical decision making (see chart for details).     Right arm pain and lower back pain after fall Most likely muscle strain Continue Aleve and apply heat to the areas as needed Follow up as needed for continued or worsening symptoms  Final Clinical Impressions(s) / UC Diagnoses   Final diagnoses:  Right arm pain  Acute left-sided low back pain without sciatica  Fall, initial encounter     Discharge Instructions     Believe this is all muscle strain from the fall.  You can keep taking the Aleve as needed and apply heat to the area.  Follow-up as needed    ED Prescriptions    None     PDMP not reviewed this encounter.   Loura Halt A, NP 07/09/19 [phone]

## 2019-07-11 ENCOUNTER — Other Ambulatory Visit: Payer: Self-pay

## 2019-07-11 ENCOUNTER — Ambulatory Visit: Payer: Medicare Other | Attending: Family Medicine

## 2019-07-11 DIAGNOSIS — I10 Essential (primary) hypertension: Secondary | ICD-10-CM

## 2019-07-11 DIAGNOSIS — Z79899 Other long term (current) drug therapy: Secondary | ICD-10-CM

## 2019-07-11 DIAGNOSIS — E1165 Type 2 diabetes mellitus with hyperglycemia: Secondary | ICD-10-CM

## 2019-07-11 DIAGNOSIS — E1169 Type 2 diabetes mellitus with other specified complication: Secondary | ICD-10-CM

## 2019-07-12 LAB — URINALYSIS
Bilirubin, UA: NEGATIVE
Glucose, UA: NEGATIVE
Ketones, UA: NEGATIVE
Leukocytes,UA: NEGATIVE
Nitrite, UA: NEGATIVE
Protein,UA: NEGATIVE
RBC, UA: NEGATIVE
Specific Gravity, UA: 1.02 (ref 1.005–1.030)
Urobilinogen, Ur: 0.2 mg/dL (ref 0.2–1.0)
pH, UA: 6.5 (ref 5.0–7.5)

## 2019-07-12 LAB — COMPREHENSIVE METABOLIC PANEL WITH GFR
ALT: 15 IU/L (ref 0–32)
AST: 17 IU/L (ref 0–40)
Albumin/Globulin Ratio: 1.4 (ref 1.2–2.2)
Albumin: 4.3 g/dL (ref 3.8–4.8)
Alkaline Phosphatase: 89 IU/L (ref 39–117)
BUN/Creatinine Ratio: 23 (ref 12–28)
BUN: 19 mg/dL (ref 8–27)
Bilirubin Total: 0.3 mg/dL (ref 0.0–1.2)
CO2: 24 mmol/L (ref 20–29)
Calcium: 10 mg/dL (ref 8.7–10.3)
Chloride: 101 mmol/L (ref 96–106)
Creatinine, Ser: 0.83 mg/dL (ref 0.57–1.00)
GFR calc Af Amer: 83 mL/min/1.73
GFR calc non Af Amer: 72 mL/min/1.73
Globulin, Total: 3.1 g/dL (ref 1.5–4.5)
Glucose: 128 mg/dL — ABNORMAL HIGH (ref 65–99)
Potassium: 4.9 mmol/L (ref 3.5–5.2)
Sodium: 141 mmol/L (ref 134–144)
Total Protein: 7.4 g/dL (ref 6.0–8.5)

## 2019-07-12 LAB — LIPID PANEL
Chol/HDL Ratio: 2.4 ratio (ref 0.0–4.4)
Cholesterol, Total: 175 mg/dL (ref 100–199)
HDL: 72 mg/dL
LDL Chol Calc (NIH): 86 mg/dL (ref 0–99)
Triglycerides: 92 mg/dL (ref 0–149)
VLDL Cholesterol Cal: 17 mg/dL (ref 5–40)

## 2019-07-12 LAB — HEMOGLOBIN A1C
Est. average glucose Bld gHb Est-mCnc: 160 mg/dL
Hgb A1c MFr Bld: 7.2 % — ABNORMAL HIGH (ref 4.8–5.6)

## 2019-07-17 ENCOUNTER — Telehealth: Payer: Self-pay | Admitting: Internal Medicine

## 2019-07-17 NOTE — Telephone Encounter (Signed)
[place] called in and requested for lab results. Patient was identified by 2 patient identifiers

## 2019-07-20 ENCOUNTER — Encounter: Payer: Self-pay | Admitting: *Deleted

## 2019-07-26 ENCOUNTER — Telehealth: Payer: Self-pay

## 2019-07-26 NOTE — Telephone Encounter (Signed)
Received call from patient / Name and DOB confirmed   As instructed by NP/MD called pt/ name and DOB verified/ made aware of results and MD/NP results note. Verbalized understanding   "Appears to be labs from urgent care visit. No findings consistent with infection on UA. Continue atorvastatin to lower cholesterol as LDL/bad cholesterol still above goal of 70 or less. Hgb A1c of 7.2 indicating improved control of diabetes. Glucose of 128 otherwise normal electrolytes and liver enzymes. "

## 2019-08-13 ENCOUNTER — Telehealth: Payer: Self-pay | Admitting: Neurology

## 2019-08-13 NOTE — Telephone Encounter (Signed)
Pt asking for a letter stating she is unable to work so she can conrtinue to get her unemployment, please call to discuss.

## 2019-08-13 NOTE — Telephone Encounter (Signed)
I called patient about wanting a letter stated she cannot work so she can continue to get her unemployment. I stated to pt there is no previous work letter from Dr. Jannifer Franklin. I ask pt was she getting unemployment prior to seeing Dr. Jannifer Franklin for a office visit. Pt stated she was receiving unemployment prior to see our office for the first time. I ask pt if her primary doctor would write her letter. Pt stated she ask primary doctor but they decline in writing a letter for her. Pt stated she cannot look for a job because of her back and leg. I stated Dr Jannifer Franklin will be in the office on Monday and can address her needing a letter. Pt verbalized understanding.

## 2019-08-14 ENCOUNTER — Encounter: Payer: Self-pay | Admitting: Neurology

## 2019-08-14 NOTE — Telephone Encounter (Signed)
I have written a letter.

## 2019-08-14 NOTE — Telephone Encounter (Signed)
Letter done will be signed by Dr.WIllis on Monday.

## 2019-08-20 NOTE — Telephone Encounter (Signed)
I called pt to state the letter was done for her unemployment. I ask pt if she wanted to come by and pick up letter from office or have it mailed. Pt stated to mail the letter to her. Pt verbalized understanding and verify her address in the system. Letter put in the mail for pt.

## 2019-10-03 ENCOUNTER — Other Ambulatory Visit: Payer: Self-pay

## 2019-10-03 ENCOUNTER — Encounter: Payer: Self-pay | Admitting: Cardiovascular Disease

## 2019-10-03 ENCOUNTER — Ambulatory Visit: Payer: Medicare Other | Admitting: Cardiovascular Disease

## 2019-10-03 VITALS — BP 104/74 | HR 101 | Ht 63.0 in | Wt 208.0 lb

## 2019-10-03 DIAGNOSIS — E782 Mixed hyperlipidemia: Secondary | ICD-10-CM

## 2019-10-03 DIAGNOSIS — I10 Essential (primary) hypertension: Secondary | ICD-10-CM

## 2019-10-03 DIAGNOSIS — I739 Peripheral vascular disease, unspecified: Secondary | ICD-10-CM

## 2019-10-03 DIAGNOSIS — E785 Hyperlipidemia, unspecified: Secondary | ICD-10-CM

## 2019-10-03 NOTE — Assessment & Plan Note (Signed)
History of hyperlipidemia on statin therapy with lipid profile performed 07/11/2019 revealing total cholesterol 175 LDL of 86 and HDL of 75.

## 2019-10-03 NOTE — Patient Instructions (Addendum)
Medication Instructions:  STOP amlodipine  *If you need a refill on your cardiac medications before your next appointment, please call your pharmacy*   Testing/Procedures: Lower Extremity Arterial Doppler @ Dr. Kennon Holter office  Dr. Gwenlyn Found has ordered a CT coronary calcium score. This test is done at [address]. This is $150 out of pocket.   Coronary CalciumScan A coronary calcium scan is an imaging test used to look for deposits of calcium and other fatty materials (plaques) in the inner lining of the blood vessels of the heart (coronary arteries). These deposits of calcium and plaques can partly clog and narrow the coronary arteries without producing any symptoms or warning signs. This puts a person at risk for a heart attack. This test can detect these deposits before symptoms develop. Tell a health care provider about:  Any allergies you have.  All medicines you are taking, including vitamins, herbs, eye drops, creams, and over-the-counter medicines.  Any problems you or family members have had with anesthetic medicines.  Any blood disorders you have.  Any surgeries you have had.  Any medical conditions you have.  Whether you are pregnant or may be pregnant. What are the risks? Generally, this is a safe procedure. However, problems may occur, including:  Harm to a pregnant woman and her unborn baby. This test involves the use of radiation. Radiation exposure can be dangerous to a pregnant woman and her unborn baby. If you are pregnant, you generally should not have this procedure done.  Slight increase in the risk of cancer. This is because of the radiation involved in the test. What happens before the procedure? No preparation is needed for this procedure. What happens during the procedure?  You will undress and remove any jewelry around your neck or chest.  You will put on a hospital gown.  Sticky electrodes will be placed on your chest. The  electrodes will be connected to an electrocardiogram (ECG) machine to record a tracing of the electrical activity of your heart.  A CT scanner will take pictures of your heart. During this time, you will be asked to lie still and hold your breath for 2-3 seconds while a picture of your heart is being taken. The procedure may vary among health care providers and hospitals. What happens after the procedure?  You can get dressed.  You can return to your normal activities.  It is up to you to get the results of your test. Ask your health care provider, or the department that is doing the test, when your results will be ready. Summary  A coronary calcium scan is an imaging test used to look for deposits of calcium and other fatty materials (plaques) in the inner lining of the blood vessels of the heart (coronary arteries).  Generally, this is a safe procedure. Tell your health care provider if you are pregnant or may be pregnant.  No preparation is needed for this procedure.  A CT scanner will take pictures of your heart.  You can return to your normal activities after the scan is done. This information is not intended to replace advice given to you by your health care provider. Make sure you discuss any questions you have with your health care provider. Document Released: 08/14/2007 Document Revised: 01/05/2016 Document Reviewed: 01/05/2016 Elsevier Interactive Patient Education  2017 [place]: At [facility], you and your health needs are our priority.  As part of our continuing mission to provide  you with exceptional heart care, we have created designated Provider Care Teams.  These Care Teams include your primary Cardiologist (physician) and Advanced Practice Providers (APPs -  Physician Assistants and Nurse Practitioners) who all work together to provide you with the care you need, when you need it.  We recommend signing up for the patient portal called  "MyChart".  Sign up information is provided on this After Visit Summary.  MyChart is used to connect with patients for Virtual Visits (Telemedicine).  Patients are able to view lab/test results, encounter notes, upcoming appointments, etc.  Non-urgent messages can be sent to your provider as well.   To learn more about what you can do with MyChart, go to [email or web address].    Your next appointment:   3 months   The format for your next appointment:   In Person  Provider:   You may see Dr. Gwenlyn Found or one of the following Advanced Practice Providers on your designated Care Team:    Kerin Ransom, PA-C  [place], [place]  Coletta Memos, [place]    Other Instructions

## 2019-10-03 NOTE — Progress Notes (Signed)
10/03/2019 Kristi Campos   1950-12-13  [id number]  Primary Physician Rocco Serene, MD Primary Cardiologist: Lorretta Harp MD Kristi Campos, [place]  HPI:  Kristi Campos is a 69 y.o. moderately overweight divorced African-American female mother of 6 children, grandmother of 45 grandchildren referred by Dr. Posey Pronto for evaluation of PAD.  She is retired from Radio broadcast assistant.  Her risk factors include treated hypertension, diabetes and hyperlipidemia.  There is no family history for heart disease.  She never had heart attack or stroke.  She denies shortness of breath but does get occasional atypical chest pain.  She has had lower extremity discomfort on the right side for several years which sounds somewhat atypical for claudication.  She also complains of lower extremity edema.   Current Meds  Medication Sig  . ACCU-CHEK GUIDE test strip See admin instructions.  Marland Kitchen aspirin EC 81 MG tablet Take 81 mg by mouth daily.  Marland Kitchen atorvastatin (LIPITOR) 10 MG tablet Take 10 mg by mouth daily.  . chlorhexidine (PERIDEX) 0.12 % solution See admin instructions.  . gabapentin (NEURONTIN) 100 MG capsule Take 100 mg by mouth 2 (two) times daily.  Marland Kitchen HYDROcodone-acetaminophen (NORCO) 5-325 MG tablet Norco 5 mg-325 mg tablet  Take 1 tablet every 6 hours by oral route as needed.  . Iron-Vitamins (GERITOL PO) Take 1 tablet by mouth 2 (two) times a week.  Marland Kitchen JARDIANCE 10 MG TABS tablet Take 10 mg by mouth daily.  Marland Kitchen losartan-hydrochlorothiazide (HYZAAR) 100-12.5 MG per tablet Take 1 tablet by mouth daily.   . meloxicam (MOBIC) 15 MG tablet Take 1 tablet (15 mg total) by mouth daily.  . meloxicam (MOBIC) 7.5 MG tablet Take 7.5 mg by mouth 2 (two) times daily as needed.  . metFORMIN (GLUCOPHAGE) 500 MG tablet Take 1/2 tablet  twice daily  . pioglitazone (ACTOS) 30 MG tablet Take 1 tablet (30 mg total) by mouth daily.  . simvastatin (ZOCOR) 10 MG tablet Take 10 mg by mouth at bedtime.  Marland Kitchen tiZANidine (ZANAFLEX) 4 MG  capsule Take 4 mg by mouth at bedtime as needed.  . traMADol (ULTRAM) 50 MG tablet Take 50 mg by mouth every 4 (four) hours as needed.  . [DISCONTINUED] amLODipine (NORVASC) 5 MG tablet Take 5 mg by mouth daily.     No Known Allergies  Social History   Socioeconomic History  . Marital status: Widowed    Spouse name: Not on file  . Number of children: Not on file  . Years of education: Not on file  . Highest education level: Not on file  Occupational History  . Not on file  Tobacco Use  . Smoking status: Never Smoker  . Smokeless tobacco: Never Used  Vaping Use  . Vaping Use: Never used  Substance and Sexual Activity  . Alcohol use: No    Alcohol/week: 0.0 standard drinks  . Drug use: No  . Sexual activity: Not Currently    Partners: Male    Birth control/protection: Post-menopausal  Other Topics Concern  . Not on file  Social History Narrative   Right handed    Lives at home alone    Social Determinants of Health   Financial Resource Strain:   . Difficulty of Paying Living Expenses:   Food Insecurity:   . Worried About Charity fundraiser in the Last Year:   . Arboriculturist in the Last Year:   Transportation Needs:   . Film/video editor (Medical):   Marland Kitchen  Lack of Transportation (Non-Medical):   Physical Activity:   . Days of Exercise per Week:   . Minutes of Exercise per Session:   Stress:   . Feeling of Stress :   Social Connections:   . Frequency of Communication with Friends and Family:   . Frequency of Social Gatherings with Friends and Family:   . Attends Religious Services:   . Active Member of Clubs or Organizations:   . Attends Archivist Meetings:   Marland Kitchen Marital Status:   Intimate Partner Violence:   . Fear of Current or Ex-Partner:   . Emotionally Abused:   Marland Kitchen Physically Abused:   . Sexually Abused:      Review of Systems: General: negative for chills, fever, night sweats or weight changes.  Cardiovascular: negative for chest pain,  dyspnea on exertion, edema, orthopnea, palpitations, paroxysmal nocturnal dyspnea or shortness of breath Dermatological: negative for rash Respiratory: negative for cough or wheezing Urologic: negative for hematuria Abdominal: negative for nausea, vomiting, diarrhea, bright red blood per rectum, melena, or hematemesis Neurologic: negative for visual changes, syncope, or dizziness All other systems reviewed and are otherwise negative except as noted above.    Blood pressure 104/74, pulse (!) 101, height 5\' 3"  (1.6 m), weight 208 lb (94.3 kg), SpO2 98 %.  General appearance: alert and no distress Neck: no adenopathy, no carotid bruit, no JVD, supple, symmetrical, trachea midline and thyroid not enlarged, symmetric, no tenderness/mass/nodules Lungs: clear to auscultation bilaterally Heart: regular rate and rhythm, S1, S2 normal, no murmur, click, rub or gallop Extremities: extremities normal, atraumatic, no cyanosis or edema Pulses: Diminished pedal pulses Skin: Skin color, texture, turgor normal. No rashes or lesions Neurologic: Alert and oriented X 3, normal strength and tone. Normal symmetric reflexes. Normal coordination and gait  EKG sinus tachycardia 101 without ST or T wave changes.  Personally reviewed this EKG.  ASSESSMENT AND PLAN:   Peripheral arterial disease ([place]) Kristi Campos was referred to me by Dr. Posey Pronto for evaluation of PAD.  She does have a history of treated hypertension, diabetes and hyperlipidemia.  She complains of right hip and lower extremity claudication.  She does have palpable pedal pulses.  And plan to get lower extremity arterial Dopplers to further evaluate  Essential hypertension History of essential potential blood pressure measured today at 104/74.  She is on amlodipine, losartan hydrochlorothiazide.  She does complain of lower extreme edema which may be exacerbated by her amlodipine which I am going to discontinue.  Hyperlipidemia History of  hyperlipidemia on statin therapy with lipid profile performed 07/11/2019 revealing total cholesterol 175 LDL of 86 and HDL of 75.      Lorretta Harp MD FACP,FACC,FAHA, [facility] 10/03/2019 2:19 PM

## 2019-10-03 NOTE — Assessment & Plan Note (Signed)
History of essential potential blood pressure measured today at 104/74.  She is on amlodipine, losartan hydrochlorothiazide.  She does complain of lower extreme edema which may be exacerbated by her amlodipine which I am going to discontinue.

## 2019-10-03 NOTE — Assessment & Plan Note (Signed)
Kristi Campos was referred to me by Dr. Posey Pronto for evaluation of PAD.  She does have a history of treated hypertension, diabetes and hyperlipidemia.  She complains of right hip and lower extremity claudication.  She does have palpable pedal pulses.  And plan to get lower extremity arterial Dopplers to further evaluate

## 2019-10-09 ENCOUNTER — Ambulatory Visit: Payer: Medicare Other | Admitting: Cardiovascular Disease

## 2019-10-10 ENCOUNTER — Ambulatory Visit (INDEPENDENT_AMBULATORY_CARE_PROVIDER_SITE_OTHER)
Admission: RE | Admit: 2019-10-10 | Discharge: 2019-10-10 | Disposition: A | Discharge status: Home / Self Care | Payer: Self-pay | Source: Ambulatory Visit | Attending: Cardiovascular Disease | Admitting: Cardiovascular Disease

## 2019-10-10 ENCOUNTER — Other Ambulatory Visit: Payer: Self-pay

## 2019-10-10 DIAGNOSIS — E785 Hyperlipidemia, unspecified: Secondary | ICD-10-CM

## 2019-10-15 ENCOUNTER — Encounter: Payer: Self-pay | Admitting: Obstetrics and Gynecology

## 2019-10-15 ENCOUNTER — Ambulatory Visit (INDEPENDENT_AMBULATORY_CARE_PROVIDER_SITE_OTHER): Payer: Medicare Other | Admitting: Obstetrics and Gynecology

## 2019-10-15 ENCOUNTER — Other Ambulatory Visit: Payer: Self-pay

## 2019-10-15 VITALS — BP 125/84 | HR 84 | Wt 209.0 lb

## 2019-10-15 DIAGNOSIS — Z01419 Encounter for gynecological examination (general) (routine) without abnormal findings: Secondary | ICD-10-CM | POA: Diagnosis not present

## 2019-10-15 NOTE — Progress Notes (Signed)
Subjective:     Kristi Campos is a 69 y.o. female postmenopausal with BMI 37 whois here for a comprehensive physical exam. The patient reports no problems. Patient is not sexually active. She denies any pelvic pain or abnormal discharge. She denies any episodes of postmenopausal vaginal bleeding. She denies urinary incontinence. Patient is without complaints  Past Medical History:  Diagnosis Date  . Diabetes mellitus without complication ([place])   . Fibroid uterus   . Hypertension    Past Surgical History:  Procedure Laterality Date  . I & D EXTREMITY Right 08/09/2013   Procedure: MINOR IRRIGATION AND DEBRIDEMENT EXTREMITY;  Surgeon: Cammie Sickle, MD;  Location: [place];  Service: Orthopedics;  Laterality: Right;  long       wound class 4   Family History  Problem Relation Age of Onset  . Bipolar disorder Mother   . Prostate cancer Other     Social History   Socioeconomic History  . Marital status: Widowed    Spouse name: Not on file  . Number of children: Not on file  . Years of education: Not on file  . Highest education level: Not on file  Occupational History  . Not on file  Tobacco Use  . Smoking status: Never Smoker  . Smokeless tobacco: Never Used  Vaping Use  . Vaping Use: Never used  Substance and Sexual Activity  . Alcohol use: No    Alcohol/week: 0.0 standard drinks  . Drug use: No  . Sexual activity: Not Currently    Partners: Male    Birth control/protection: Post-menopausal  Other Topics Concern  . Not on file  Social History Narrative   Right handed    Lives at home alone    Social Determinants of Health   Financial Resource Strain:   . Difficulty of Paying Living Expenses:   Food Insecurity:   . Worried About Charity fundraiser in the Last Year:   . Arboriculturist in the Last Year:   Transportation Needs:   . Film/video editor (Medical):   Marland Kitchen Lack of Transportation (Non-Medical):   Physical Activity:   . Days of  Exercise per Week:   . Minutes of Exercise per Session:   Stress:   . Feeling of Stress :   Social Connections:   . Frequency of Communication with Friends and Family:   . Frequency of Social Gatherings with Friends and Family:   . Attends Religious Services:   . Active Member of Clubs or Organizations:   . Attends Archivist Meetings:   Marland Kitchen Marital Status:   Intimate Partner Violence:   . Fear of Current or Ex-Partner:   . Emotionally Abused:   Marland Kitchen Physically Abused:   . Sexually Abused:    Health Maintenance  Topic Date Due  . Hepatitis C Screening  Never done  . COVID-19 Vaccine (1) Never done  . INFLUENZA VACCINE  09/30/2019  . FOOT EXAM  10/24/2019  . OPHTHALMOLOGY EXAM  12/22/2019  . HEMOGLOBIN A1C  01/11/2020  . MAMMOGRAM  07/24/2020  . TETANUS/TDAP  11/23/2028  . COLONOSCOPY  06/17/2029  . DEXA SCAN  Completed  . PNA vac Low Risk Adult  Discontinued       Review of Systems Pertinent items noted in HPI and remainder of comprehensive ROS otherwise negative.   Objective:  Blood pressure 125/84, pulse 84, weight 209 lb (94.8 kg).     GENERAL: Well-developed, well-nourished female in no  acute distress.  HEENT: Normocephalic, atraumatic. Sclerae anicteric.  NECK: Supple. Normal thyroid.  LUNGS: Clear to auscultation bilaterally.  HEART: Regular rate and rhythm. BREASTS: Symmetric in size. No palpable masses or lymphadenopathy, skin changes, or nipple drainage. ABDOMEN: Soft, nontender, nondistended. No organomegaly. PELVIC: Normal external female genitalia. Vagina is pale and smooth.  Normal discharge. Normal appearing cervix. Uterus is normal in size. No adnexal mass or tenderness. EXTREMITIES: No cyanosis, clubbing, or edema, 2+ distal pulses.    Assessment:    Healthy female exam.      Plan:    Pap smear not indicated (patient with history of all normal pap smear and over 81 yo) Screening mammogram ordered Patient will be contacted with abnormal  results RTC in 1 year or prn See After Visit Summary for Counseling Recommendations

## 2019-10-15 NOTE — Progress Notes (Signed)
Patient presents for Annual Exam.  Last pap: 04/19/19  Mammogram: 07/25/2018 WNL per pt   Family Hx of Breast Cancer: None   Dexa Scan: at 69 yrs old per pt  Pt requested vaginal swab for BV and yeast only declined any STD screening.   CC: none

## 2019-10-17 ENCOUNTER — Other Ambulatory Visit: Payer: Self-pay | Admitting: Obstetrics and Gynecology

## 2019-10-17 DIAGNOSIS — Z0000 Encounter for general adult medical examination without abnormal findings: Secondary | ICD-10-CM

## 2019-10-23 ENCOUNTER — Other Ambulatory Visit (HOSPITAL_COMMUNITY): Payer: Self-pay | Admitting: Cardiovascular Disease

## 2019-10-23 DIAGNOSIS — I739 Peripheral vascular disease, unspecified: Secondary | ICD-10-CM

## 2019-11-02 ENCOUNTER — Other Ambulatory Visit: Payer: Self-pay

## 2019-11-02 ENCOUNTER — Ambulatory Visit
Admission: RE | Admit: 2019-11-02 | Discharge: 2019-11-02 | Disposition: A | Discharge status: Home / Self Care | Payer: Medicare Other | Source: Ambulatory Visit | Attending: Obstetrics and Gynecology | Admitting: Obstetrics and Gynecology

## 2019-11-02 DIAGNOSIS — Z0000 Encounter for general adult medical examination without abnormal findings: Secondary | ICD-10-CM

## 2019-11-12 ENCOUNTER — Other Ambulatory Visit: Payer: Self-pay

## 2019-11-12 ENCOUNTER — Ambulatory Visit (HOSPITAL_COMMUNITY)
Admission: RE | Admit: 2019-11-12 | Discharge: 2019-11-12 | Disposition: A | Discharge status: Home / Self Care | Payer: Medicare Other | Source: Ambulatory Visit | Attending: Cardiovascular Disease | Admitting: Cardiovascular Disease

## 2019-11-12 DIAGNOSIS — I739 Peripheral vascular disease, unspecified: Secondary | ICD-10-CM | POA: Diagnosis not present

## 2019-11-26 ENCOUNTER — Ambulatory Visit: Payer: Medicare Other | Admitting: Neurology

## 2019-11-26 ENCOUNTER — Encounter: Payer: Self-pay | Admitting: Neurology

## 2019-11-26 ENCOUNTER — Other Ambulatory Visit: Payer: Self-pay

## 2019-11-26 VITALS — BP 159/85 | HR 79 | Ht 63.0 in | Wt 207.0 lb

## 2019-11-26 DIAGNOSIS — G8929 Other chronic pain: Secondary | ICD-10-CM

## 2019-11-26 DIAGNOSIS — M545 Low back pain: Secondary | ICD-10-CM

## 2019-11-26 NOTE — Progress Notes (Signed)
Reason for visit: Low back pain, right-sided sciatica  Kristi Campos is an 69 y.o. female  History of present illness:  Kristi Campos is a 69 year old right-handed white female with a history of diabetes and hypertension.  She does have some low back pain and some discomfort down the right leg.  MRI of the low back showed some potential for left L5 nerve root compression, EMG on the right leg did not show evidence of a lumbar radiculopathy.  The patient has been followed by Dr. Nelva Bush, she has gotten an epidural steroid injection with some benefit.  She is planning on getting another injection in the near future.  The patient believes that she is not really having a lot of low back pain at this time, she does not have any discomfort in the right leg.  If she walks long distances, her low back will hurt.  The patient is off of gabapentin.  She denies any weakness of the extremities.  She returns to this office for further evaluation.  Past Medical History:  Diagnosis Date  . Diabetes mellitus without complication ([place])   . Fibroid uterus   . Hypertension     Past Surgical History:  Procedure Laterality Date  . I & D EXTREMITY Right 08/09/2013   Procedure: MINOR IRRIGATION AND DEBRIDEMENT EXTREMITY;  Surgeon: Cammie Sickle, MD;  Location: [place];  Service: Orthopedics;  Laterality: Right;  long       wound class 4    Family History  Problem Relation Age of Onset  . Bipolar disorder Mother   . Prostate cancer Other     Social history:  reports that she has never smoked. She has never used smokeless tobacco. She reports that she does not drink alcohol and does not use drugs.   No Known Allergies  Medications:  Prior to Admission medications   Medication Sig Start Date End Date Taking? Authorizing Provider  ACCU-CHEK GUIDE test strip See admin instructions. 07/04/19  Yes [provider]  aspirin EC 81 MG tablet Take 81 mg by mouth daily.   Yes [provider]  atorvastatin (LIPITOR) 10 MG tablet Take 10 mg by mouth daily. 05/27/19  Yes [provider]  chlorhexidine (PERIDEX) 0.12 % solution See admin instructions. 04/26/19  Yes [provider]  gabapentin (NEURONTIN) 100 MG capsule Take 100 mg by mouth 2 (two) times daily. 02/05/19  Yes [provider]  HYDROcodone-acetaminophen (NORCO) 5-325 MG tablet Norco 5 mg-325 mg tablet  Take 1 tablet every 6 hours by oral route as needed.   Yes [provider]  Iron-Vitamins (GERITOL PO) Take 1 tablet by mouth 2 (two) times a week.   Yes [provider]  JARDIANCE 10 MG TABS tablet Take 10 mg by mouth daily. 02/16/19  Yes [provider]  losartan-hydrochlorothiazide (HYZAAR) 100-12.5 MG per tablet Take 1 tablet by mouth daily.  10/08/14  Yes [provider]  meloxicam (MOBIC) 15 MG tablet Take 1 tablet (15 mg total) by mouth daily. 05/04/19  Yes Shelda Pal, DO  meloxicam (MOBIC) 7.5 MG tablet Take 7.5 mg by mouth 2 (two) times daily as needed. 06/06/19  Yes [provider]  metFORMIN (GLUCOPHAGE) 500 MG tablet Take 1/2 tablet  twice daily 10/24/18  Yes Wendling, Crosby Oyster, DO  pioglitazone (ACTOS) 30 MG tablet Take 1 tablet (30 mg total) by mouth daily. 12/20/18  Yes Shelda Pal, DO  simvastatin (ZOCOR) 10 MG tablet  Take 10 mg by mouth at bedtime. 01/01/19  Yes [provider]  tiZANidine (ZANAFLEX) 4 MG capsule Take 4 mg by mouth at bedtime as needed. 02/15/19  Yes [provider]  traMADol (ULTRAM) 50 MG tablet Take 50 mg by mouth every 4 (four) hours as needed. 03/09/19  Yes [provider]    ROS:  Out of a complete 14 system review of symptoms, the patient complains only of the following symptoms, and all other reviewed systems are negative.  Low back pain  Blood pressure (!) 159/85, pulse 79, height 5\' 3"  (1.6 m), weight 207 lb (93.9 kg).  Physical Exam  General: The  patient is alert and cooperative at the time of the examination.  The patient is moderately to markedly obese.  Neuromuscular: The patient is able to flex the low back to about 90 degrees.  Skin: No significant peripheral edema is noted.   Neurologic Exam  Mental status: The patient is alert and oriented x 3 at the time of the examination. The patient has apparent normal recent and remote memory, with an apparently normal attention span and concentration ability.   Cranial nerves: Facial symmetry is present. Speech is stuttering at times, without aphasia or dysarthria. Extraocular movements are full. Visual fields are full.  Motor: The patient has good strength in all 4 extremities.  The patient is able to walk on heels and the toes bilaterally.  Sensory examination: Soft touch sensation is symmetric on the face, arms, and legs.  Coordination: The patient has good finger-nose-finger and heel-to-shin bilaterally.  Gait and station: The patient has a slightly wide-based gait, the patient will shuffle her feet with turns.  She normally uses a cane for ambulation.  Romberg is negative.  Reflexes: Deep tendon reflexes are symmetric.   Assessment/Plan:  1.  Chronic low back pain  2.  Diabetes  The patient is being managed currently through her pain doctor, Dr.Ramos.  He seems to be getting fairly good benefit with use of epidural steroid injections.  The patient will follow up through this office on as-needed basis.  She is not getting any medications through this office.  Kristi Alexanders MD 11/26/2019 8:19 AM  [facility] [address] [place], [place] [address]  Phone [phone] Fax [phone]

## 2019-12-06 ENCOUNTER — Other Ambulatory Visit: Payer: Self-pay

## 2019-12-06 ENCOUNTER — Other Ambulatory Visit: Payer: Self-pay | Admitting: Physician Assistant

## 2019-12-06 ENCOUNTER — Encounter: Payer: Self-pay | Admitting: Physician Assistant

## 2019-12-06 ENCOUNTER — Ambulatory Visit: Payer: Medicare Other | Attending: Physician Assistant | Admitting: Physician Assistant

## 2019-12-06 VITALS — BP 150/82 | HR 76 | Temp 96.1°F | Wt 207.0 lb

## 2019-12-06 DIAGNOSIS — R2689 Other abnormalities of gait and mobility: Secondary | ICD-10-CM | POA: Diagnosis not present

## 2019-12-06 DIAGNOSIS — I10 Essential (primary) hypertension: Secondary | ICD-10-CM | POA: Diagnosis not present

## 2019-12-06 DIAGNOSIS — E1165 Type 2 diabetes mellitus with hyperglycemia: Secondary | ICD-10-CM | POA: Diagnosis not present

## 2019-12-06 DIAGNOSIS — E782 Mixed hyperlipidemia: Secondary | ICD-10-CM

## 2019-12-06 DIAGNOSIS — R232 Flushing: Secondary | ICD-10-CM

## 2019-12-06 LAB — GLUCOSE, POCT (MANUAL RESULT ENTRY): POC Glucose: 140 mg/dl — AB (ref 70–99)

## 2019-12-06 LAB — POCT GLYCOSYLATED HEMOGLOBIN (HGB A1C): Hemoglobin A1C: 7.6 % — AB (ref 4.0–5.6)

## 2019-12-06 MED ORDER — ATORVASTATIN CALCIUM 10 MG PO TABS: 10.0000 mg | ORAL_TABLET | Freq: Every day | ORAL | 1 refills | Status: DC

## 2019-12-06 MED ORDER — GLIPIZIDE 5 MG PO TABS: 5.0000 mg | ORAL_TABLET | Freq: Every day | ORAL | 1 refills | Status: DC

## 2019-12-06 MED ORDER — LOSARTAN POTASSIUM-HCTZ 100-25 MG PO TABS: 1.0000 | ORAL_TABLET | Freq: Every day | ORAL | 3 refills | Status: DC

## 2019-12-06 MED FILL — LOSARTAN-HCTZ 100-25 MG TAB: 100-25 | 30 days supply | Qty: 30 | Fill #0

## 2019-12-06 MED FILL — glipiZIDE 5 MG TABS: 5 | 30 days supply | Qty: 30 | Fill #0

## 2019-12-06 NOTE — Patient Instructions (Signed)
Eat less sugar Drink more water Check sugars regularly  Make an appt with your gynecologist about hot flashes.  Black cohosh is an over the counter remedy some women find helpful   Menopause Menopause is the normal time of life when menstrual periods stop completely. It is usually confirmed by 12 months without a menstrual period. The transition to menopause (perimenopause) most often happens between the ages of 35 and 55. During perimenopause, hormone levels change in your body, which can cause symptoms and affect your health. Menopause may increase your risk for:  Loss of bone (osteoporosis), which causes bone breaks (fractures).  Depression.  Hardening and narrowing of the arteries (atherosclerosis), which can cause heart attacks and strokes. What are the causes? This condition is usually caused by a natural change in hormone levels that happens as you get older. The condition may also be caused by surgery to remove both ovaries (bilateral oophorectomy). What increases the risk? This condition is more likely to start at an earlier age if you have certain medical conditions or treatments, including:  A tumor of the pituitary gland in the brain.  A disease that affects the ovaries and hormone production.  Radiation treatment for cancer.  Certain cancer treatments, such as chemotherapy or hormone (anti-estrogen) therapy.  Heavy smoking and excessive alcohol use.  Family history of early menopause. This condition is also more likely to develop earlier in women who are very thin. What are the signs or symptoms? Symptoms of this condition include:  Hot flashes.  Irregular menstrual periods.  Night sweats.  Changes in feelings about sex. This could be a decrease in sex drive or an increased comfort around your sexuality.  Vaginal dryness and thinning of the vaginal walls. This may cause painful intercourse.  Dryness of the skin and development of  wrinkles.  Headaches.  Problems sleeping (insomnia).  Mood swings or irritability.  Memory problems.  Weight gain.  Hair growth on the face and chest.  Bladder infections or problems with urinating. How is this diagnosed? This condition is diagnosed based on your medical history, a physical exam, your age, your menstrual history, and your symptoms. Hormone tests may also be done. How is this treated? In some cases, no treatment is needed. You and your health care provider should make a decision together about whether treatment is necessary. Treatment will be based on your individual condition and preferences. Treatment for this condition focuses on managing symptoms. Treatment may include:  Menopausal hormone therapy (MHT).  Medicines to treat specific symptoms or complications.  Acupuncture.  Vitamin or herbal supplements. Before starting treatment, make sure to let your health care provider know if you have a personal or family history of:  Heart disease.  Breast cancer.  Blood clots.  Diabetes.  Osteoporosis. Follow these instructions at home: Lifestyle  Do not use any products that contain nicotine or tobacco, such as cigarettes and e-cigarettes. If you need help quitting, ask your health care provider.  Get at least 30 minutes of physical activity on 5 or more days each week.  Avoid alcoholic and caffeinated beverages, as well as spicy foods. This may help prevent hot flashes.  Get 7-8 hours of sleep each night.  If you have hot flashes, try: ? Dressing in layers. ? Avoiding things that may trigger hot flashes, such as spicy food, warm places, or stress. ? Taking slow, deep breaths when a hot flash starts. ? Keeping a fan in your home and office.  Find ways to manage  stress, such as deep breathing, meditation, or journaling.  Consider going to group therapy with other women who are having menopause symptoms. Ask your health care provider about recommended  group therapy meetings. Eating and drinking  Eat a healthy, balanced diet that contains whole grains, lean protein, low-fat dairy, and plenty of fruits and vegetables.  Your health care provider may recommend adding more soy to your diet. Foods that contain soy include tofu, tempeh, and soy milk.  Eat plenty of foods that contain calcium and vitamin D for bone health. Items that are rich in calcium include low-fat milk, yogurt, beans, almonds, sardines, broccoli, and kale. Medicines  Take over-the-counter and prescription medicines only as told by your health care provider.  Talk with your health care provider before starting any herbal supplements. If prescribed, take vitamins and supplements as told by your health care provider. These may include: ? Calcium. Women age 14 and older should get 1,200 mg (milligrams) of calcium every day. ? Vitamin D. Women need 600-800 International Units of vitamin D each day. ? Vitamins B12 and B6. Aim for 50 micrograms of B12 and 1.5 mg of B6 each day. General instructions  Keep track of your menstrual periods, including: ? When they occur. ? How heavy they are and how long they last. ? How much time passes between periods.  Keep track of your symptoms, noting when they start, how often you have them, and how long they last.  Use vaginal lubricants or moisturizers to help with vaginal dryness and improve comfort during sex.  Keep all follow-up visits as told by your health care provider. This is important. This includes any group therapy or counseling. Contact a health care provider if:  You are still having menstrual periods after age 32.  You have pain during sex.  You have not had a period for 12 months and you develop vaginal bleeding. Get help right away if:  You have: ? Severe depression. ? Excessive vaginal bleeding. ? Pain when you urinate. ? A fast or irregular heart beat (palpitations). ? Severe headaches. ? Abdomen (abdominal)  pain or severe indigestion.  You fell and you think you have a broken bone.  You develop leg or chest pain.  You develop vision problems.  You feel a lump in your breast. Summary  Menopause is the normal time of life when menstrual periods stop completely. It is usually confirmed by 12 months without a menstrual period.  The transition to menopause (perimenopause) most often happens between the ages of 71 and 60.  Symptoms can be managed through medicines, lifestyle changes, and complementary therapies such as acupuncture.  Eat a balanced diet that is rich in nutrients to promote bone health and heart health and to manage symptoms during menopause. This information is not intended to replace advice given to you by your health care provider. Make sure you discuss any questions you have with your health care provider. Document Revised: 01/28/2017 Document Reviewed: 03/20/2016 Elsevier Patient Education  2020 [employer].

## 2019-12-06 NOTE — Progress Notes (Signed)
Kristi Campos, is a 69 y.o. female  DJM:[id number]  QIW:[id number]  DOB - 12-23-1950  Subjective:  Chief Complaint and HPI: Kristi Campos is a 69 y.o. female here today for diabetes and BP check.  Does not check blood sugars or BP OOO regularly.  Compliant with meds but not with diet.   C/o hot flashes since age 33.  No bleeding.  Feels as though she has been off balance now for about a year.  Retired about 1 year ago.  Has been using a cane since retirement.    ROS:   Constitutional:  No f/c, No night sweats, No unexplained weight loss. EENT:  No vision changes, No blurry vision, No hearing changes. No mouth, throat, or ear problems.  Respiratory: No cough, No SOB Cardiac: No CP, no palpitations GI:  No abd pain, No N/V/D. GU: No Urinary s/sx Musculoskeletal: No joint pain Neuro: No headache, no dizziness, no motor weakness.  Skin: No rash Endocrine:  No polydipsia. No polyuria.  Psych: Denies SI/HI  No problems updated.  ALLERGIES: No Known Allergies  PAST MEDICAL HISTORY: Past Medical History:  Diagnosis Date   Diabetes mellitus without complication ([place])    Fibroid uterus    Hypertension     MEDICATIONS AT HOME: Prior to Admission medications   Medication Sig Start Date End Date Taking? Authorizing Provider  aspirin EC 81 MG tablet Take 81 mg by mouth daily.   Yes [provider]  atorvastatin (LIPITOR) 10 MG tablet Take 1 tablet (10 mg total) by mouth daily. 12/06/19  Yes Argentina Donovan, PA-C  ACCU-CHEK GUIDE test strip See admin instructions. Patient not taking: Reported on 12/06/2019 07/04/19   [provider]  chlorhexidine (PERIDEX) 0.12 % solution See admin instructions. Patient not taking: Reported on 12/06/2019 04/26/19   [provider]  glipiZIDE (GLUCOTROL) 5 MG tablet Take 1 tablet (5 mg total) by mouth daily before breakfast. 12/06/19   Argentina Donovan, PA-C  HYDROcodone-acetaminophen (NORCO) 5-325 MG tablet Norco 5  mg-325 mg tablet  Take 1 tablet every 6 hours by oral route as needed. Patient not taking: Reported on 12/06/2019    [provider]  Iron-Vitamins (GERITOL PO) Take 1 tablet by mouth 2 (two) times a week. Patient not taking: Reported on 12/06/2019    [provider]  JARDIANCE 10 MG TABS tablet Take 10 mg by mouth daily. Patient not taking: Reported on 12/06/2019 02/16/19   [provider]  losartan-hydrochlorothiazide (HYZAAR) 100-25 MG tablet Take 1 tablet by mouth daily. 12/06/19   Argentina Donovan, PA-C  meloxicam (MOBIC) 15 MG tablet Take 1 tablet (15 mg total) by mouth daily. Patient not taking: Reported on 12/06/2019 05/04/19   Shelda Pal, DO  meloxicam (MOBIC) 7.5 MG tablet Take 7.5 mg by mouth 2 (two) times daily as needed. Patient not taking: Reported on 12/06/2019 06/06/19   [provider]  simvastatin (ZOCOR) 10 MG tablet Take 10 mg by mouth at bedtime. Patient not taking: Reported on 12/06/2019 01/01/19   [provider]  traMADol (ULTRAM) 50 MG tablet Take 50 mg by mouth every 4 (four) hours as needed. Patient not taking: Reported on 12/06/2019 03/09/19   [provider]     Objective:  EXAM:   Vitals:   12/06/19 0953  BP: (!) 150/82  Pulse: 76  Temp: (!) 96.1 F (35.6 C)  TempSrc: Temporal  SpO2: 96%  Weight: 207 lb (93.9 kg)    General appearance :  A&OX3. NAD. Non-toxic-appearing HEENT: Atraumatic and Normocephalic.  PERRLA. EOM intact.   Neck: supple, no JVD. No cervical lymphadenopathy. No thyromegaly Chest/Lungs:  Breathing-non-labored, Good air entry bilaterally, breath sounds normal without rales, rhonchi, or wheezing  CVS: S1 S2 regular, no murmurs, gallops, rubs  Uses a cane to stabilize and ambulate.  No limp.  Gross motor skills intact.  Extremities: Bilateral Lower Ext shows no edema, both legs are warm to touch with = pulse throughout Neurology:  CN II-XII grossly intact, Non focal.  Neg  [place]berg Psych:  TP linear. J/I fair. Normal speech with some mumbling. Appropriate eye contact and affect.  Skin:  No Rash  Data Review Lab Results  Component Value Date   HGBA1C 7.6 (A) 12/06/2019   HGBA1C 7.2 (H) 07/11/2019   HGBA1C 9.9 (H) 10/24/2018     Assessment & Plan   1. Uncontrolled type 2 diabetes mellitus with hyperglycemia (HCC) Uncontrolled-increase glipizide dose I have had a lengthy discussion and provided education about insulin resistance and the intake of too much sugar/refined carbohydrates.  I have advised the patient to work at a goal of eliminating sugary drinks, candy, desserts, sweets, refined sugars, processed foods, and white carbohydrates.  The patient expresses understanding.  - Glucose (CBG) - HgB A1c - glipiZIDE (GLUCOTROL) 5 MG tablet; Take 1 tablet (5 mg total) by mouth daily before breakfast.  Dispense: 90 tablet; Refill: 1 - Comprehensive metabolic panel  2. Essential hypertension Uncontrolled-increase dose We have discussed target BP range and blood pressure goal. I have advised patient to check BP regularly and to call us back or report to clinic if the numbers are consistently higher than 140/90. We discussed the importance of compliance with medical therapy and DASH diet recommended, consequences of uncontrolled hypertension discussed.    - losartan-hydrochlorothiazide (HYZAAR) 100-25 MG tablet; Take 1 tablet by mouth daily.  Dispense: 90 tablet; Refill: 3 - Comprehensive metabolic panel  3. Mixed hyperlipidemia - atorvastatin (LIPITOR) 10 MG tablet; Take 1 tablet (10 mg total) by mouth daily.  Dispense: 90 tablet; Refill: 1 - Lipid panel  4. Poor balance No red flags today - Thyroid Panel With TSH - Vitamin D, 25-hydroxy - Ambulatory referral to Neurology  5. Hot flashes Can try black kohosh; see her gyn - Thyroid Panel With TSH - Vitamin D, 25-hydroxy     Patient have been counseled extensively about nutrition and  exercise  Return in about 3 months (around 03/07/2020) for PCP;  chronic conditions.  The patient was given clear instructions to go to ER or return to medical center if symptoms don't improve, worsen or new problems develop. The patient verbalized understanding. The patient was told to call to get lab results if they haven't heard anything in the next week.     Freeman Caldron, PA-C [facility] [place], [place]   12/06/2019, 10:35 AMPatient ID: Kristi Campos, female   DOB: August 08, 1950, 69 y.o.   MRN: [id number]

## 2019-12-07 LAB — COMPREHENSIVE METABOLIC PANEL
ALT: 18 IU/L (ref 0–32)
AST: 16 IU/L (ref 0–40)
Albumin/Globulin Ratio: 1.3 (ref 1.2–2.2)
Albumin: 4.3 g/dL (ref 3.8–4.8)
Alkaline Phosphatase: 79 IU/L (ref 44–121)
BUN/Creatinine Ratio: 18 (ref 12–28)
BUN: 16 mg/dL (ref 8–27)
Bilirubin Total: 0.4 mg/dL (ref 0.0–1.2)
CO2: 22 mmol/L (ref 20–29)
Calcium: 10.8 mg/dL — ABNORMAL HIGH (ref 8.7–10.3)
Chloride: 104 mmol/L (ref 96–106)
Creatinine, Ser: 0.91 mg/dL (ref 0.57–1.00)
GFR calc Af Amer: 74 mL/min/{1.73_m2} (ref >59)
GFR calc non Af Amer: 65 mL/min/{1.73_m2} (ref >59)
Globulin, Total: 3.4 g/dL (ref 1.5–4.5)
Glucose: 130 mg/dL — ABNORMAL HIGH (ref 65–99)
Potassium: 4 mmol/L (ref 3.5–5.2)
Sodium: 143 mmol/L (ref 134–144)
Total Protein: 7.7 g/dL (ref 6.0–8.5)

## 2019-12-07 LAB — THYROID PANEL WITH TSH
Free Thyroxine Index: 2.1 (ref 1.2–4.9)
T3 Uptake Ratio: 29 % (ref 24–39)
T4, Total: 7.3 ug/dL (ref 4.5–12.0)
TSH: 1.01 u[IU]/mL (ref 0.450–4.500)

## 2019-12-07 LAB — LIPID PANEL
Chol/HDL Ratio: 3.3 ratio (ref 0.0–4.4)
Cholesterol, Total: 177 mg/dL (ref 100–199)
HDL: 53 mg/dL (ref >39)
LDL Chol Calc (NIH): 105 mg/dL — ABNORMAL HIGH (ref 0–99)
Triglycerides: 107 mg/dL (ref 0–149)
VLDL Cholesterol Cal: 19 mg/dL (ref 5–40)

## 2019-12-07 LAB — VITAMIN D 25 HYDROXY (VIT D DEFICIENCY, FRACTURES): Vit D, 25-Hydroxy: 33.1 ng/mL (ref 30.0–100.0)

## 2019-12-17 ENCOUNTER — Encounter: Payer: Self-pay | Admitting: Obstetrics and Gynecology

## 2019-12-17 ENCOUNTER — Ambulatory Visit: Payer: Medicare Other | Admitting: Obstetrics and Gynecology

## 2019-12-17 ENCOUNTER — Other Ambulatory Visit: Payer: Self-pay

## 2019-12-17 VITALS — BP 156/91 | HR 78 | Ht 63.0 in | Wt 204.0 lb

## 2019-12-17 DIAGNOSIS — D259 Leiomyoma of uterus, unspecified: Secondary | ICD-10-CM

## 2019-12-17 NOTE — Progress Notes (Signed)
69 yo P0 presenting today for the evaluation of hot flashes. Patient reports experiencing hot flashes since the age of 40. The occur mainly in the evening. She denies any other symptoms. She denies urinary incontinence. She is not sexually active. Patient recently had a normal TSH and adjustments made to diabetic education. Patient is requesting a pelvic ultrasound to follow up on fibroid uterus. She denies any pelvic pain  Past Medical History:  Diagnosis Date  . Diabetes mellitus without complication ([place])   . Fibroid uterus   . Hypertension    Past Surgical History:  Procedure Laterality Date  . I & D EXTREMITY Right 08/09/2013   Procedure: MINOR IRRIGATION AND DEBRIDEMENT EXTREMITY;  Surgeon: Cammie Sickle, MD;  Location: [facility];  Service: Orthopedics;  Laterality: Right;  long       wound class 4   Family History  Problem Relation Age of Onset  . Bipolar disorder Mother   . Prostate cancer Other    Social History   Tobacco Use  . Smoking status: Never Smoker  . Smokeless tobacco: Never Used  Vaping Use  . Vaping Use: Never used  Substance Use Topics  . Alcohol use: No    Alcohol/week: 0.0 standard drinks  . Drug use: No   ROS See pertinent in HPI. All other systems reviewed and negative Blood pressure (!) 156/91, pulse 78, height 5\' 3"  (1.6 m), weight 204 lb (92.5 kg).  GENERAL: Well-developed, well-nourished female in no acute distress.  NEURO: alert and oriented x 3  A/P 69 yo with hot flashes - Patient is not interested in HRT - discussed benefits of black kohosh - pelvic ultrasound ordered per patient request - Patient will be contacted with abnormal results

## 2019-12-17 NOTE — Progress Notes (Signed)
New GYN presents for Hot Flashes at night.

## 2019-12-27 ENCOUNTER — Ambulatory Visit
Admission: RE | Admit: 2019-12-27 | Discharge: 2019-12-27 | Disposition: A | Discharge status: Home / Self Care | Payer: Medicare Other | Source: Ambulatory Visit | Attending: Obstetrics and Gynecology | Admitting: Obstetrics and Gynecology

## 2019-12-27 ENCOUNTER — Other Ambulatory Visit: Payer: Self-pay

## 2019-12-27 DIAGNOSIS — D259 Leiomyoma of uterus, unspecified: Secondary | ICD-10-CM

## 2020-01-04 ENCOUNTER — Ambulatory Visit: Payer: Medicare Other | Admitting: Cardiovascular Disease

## 2020-01-21 ENCOUNTER — Other Ambulatory Visit: Payer: Self-pay

## 2020-01-21 ENCOUNTER — Ambulatory Visit (HOSPITAL_COMMUNITY)
Admission: EM | Admit: 2020-01-21 | Discharge: 2020-01-21 | Disposition: A | Discharge status: Home / Self Care | Payer: Medicare Other | Attending: Family Medicine | Admitting: Family Medicine

## 2020-01-21 ENCOUNTER — Encounter (HOSPITAL_COMMUNITY): Payer: Self-pay

## 2020-01-21 DIAGNOSIS — M62830 Muscle spasm of back: Secondary | ICD-10-CM

## 2020-01-21 DIAGNOSIS — S39012A Strain of muscle, fascia and tendon of lower back, initial encounter: Secondary | ICD-10-CM

## 2020-01-21 MED ORDER — MELOXICAM 7.5 MG PO TABS: 7.5000 mg | ORAL_TABLET | Freq: Every day | ORAL | 0 refills | Status: DC

## 2020-01-21 MED ORDER — PREDNISONE 20 MG PO TABS: 40.0000 mg | ORAL_TABLET | Freq: Every day | ORAL | 0 refills | Status: DC
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
## 2020-01-21 NOTE — Discharge Instructions (Signed)
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
## 2020-02-14 MED FILL — glipiZIDE 5 MG TABS: 5 | 30 days supply | Qty: 30 | Fill #1

## 2020-02-14 MED FILL — LOSARTAN-HCTZ 100-25 MG TAB: 100-25 | 30 days supply | Qty: 30 | Fill #1

## 2020-02-28 ENCOUNTER — Encounter (HOSPITAL_COMMUNITY): Payer: Self-pay | Admitting: Emergency Medicine

## 2020-02-28 ENCOUNTER — Ambulatory Visit (HOSPITAL_COMMUNITY)
Admission: EM | Admit: 2020-02-28 | Discharge: 2020-02-28 | Disposition: A | Discharge status: Home / Self Care | Payer: Medicare Other | Attending: Family Medicine | Admitting: Family Medicine

## 2020-02-28 ENCOUNTER — Other Ambulatory Visit: Payer: Self-pay

## 2020-02-28 ENCOUNTER — Ambulatory Visit (INDEPENDENT_AMBULATORY_CARE_PROVIDER_SITE_OTHER): Payer: Medicare Other

## 2020-02-28 DIAGNOSIS — M19031 Primary osteoarthritis, right wrist: Secondary | ICD-10-CM | POA: Diagnosis not present

## 2020-02-28 DIAGNOSIS — M25531 Pain in right wrist: Secondary | ICD-10-CM | POA: Diagnosis not present

## 2020-02-28 MED ORDER — MELOXICAM 7.5 MG PO TABS: 7.5000 mg | ORAL_TABLET | Freq: Every day | ORAL | 0 refills | Status: DC

## 2020-02-28 NOTE — ED Provider Notes (Signed)
[facility]    CSN: [id number] Arrival date & time: 02/28/20  2353      History   Chief Complaint Chief Complaint  Patient presents with  . Hand Pain    HPI Kristi Campos is a 69 y.o. female.   HPI  Woke up with right wrist pain yesterday.  Does not have any idea what caused it.  No activity.  No trauma or fall.  No repetitive motion.  She has never had wrist problems before.  She is never been told she had wrist arthritis.  She does not recall an old wrist fracture.  No numbness in the fingers.  Full use of the fingers.  Pain with any movement of the wrist, especially flexion and supination.  Mild swelling.  Mild warmth  Patient has a history of diabetes and hypertension, per patient they are well controlled.  Last A1c on record is 7.6.  Normal kidney function noted  Past Medical History:  Diagnosis Date  . Diabetes mellitus without complication (HCC)   . Fibroid uterus   . Hypertension   . Vaginal Pap smear, abnormal     Patient Active Problem List   Diagnosis Date Noted  . Primary osteoarthritis, right wrist 02/28/2020  . Peripheral arterial disease (HCC) 10/03/2019  . Essential hypertension 10/03/2019  . Hyperlipidemia 10/03/2019  . Diabetes mellitus type 2 in obese (HCC) 11/24/2018  . Chronic right-sided low back pain without sciatica 10/24/2018    Past Surgical History:  Procedure Laterality Date  . I & D EXTREMITY Right 08/09/2013   Procedure: MINOR IRRIGATION AND DEBRIDEMENT EXTREMITY;  Surgeon: Wyn Forster, MD;  Location: [facility];  Service: Orthopedics;  Laterality: Right;  long       wound class 4    OB History    Gravida  6   Para  4   Term  4   Preterm  0   AB  0   Living  4     SAB  0   IAB  0   Ectopic  0   Multiple  0   Live Births               Home Medications    Prior to Admission medications   Medication Sig Start Date End Date Taking? Authorizing Provider  ACCU-CHEK GUIDE test  strip See admin instructions. Patient not taking: Reported on 12/06/2019 07/04/19   [provider]  aspirin EC 81 MG tablet Take 81 mg by mouth daily.    [provider]  atorvastatin (LIPITOR) 10 MG tablet Take 1 tablet (10 mg total) by mouth daily. 12/06/19   Anders Simmonds, PA-C  glipiZIDE (GLUCOTROL) 5 MG tablet Take 1 tablet (5 mg total) by mouth daily before breakfast. 12/06/19   McClung, Marzella Schlein, PA-C  losartan-hydrochlorothiazide (HYZAAR) 100-25 MG tablet Take 1 tablet by mouth daily. 12/06/19   Anders Simmonds, PA-C  meloxicam (MOBIC) 7.5 MG tablet Take 1 tablet (7.5 mg total) by mouth daily. 02/28/20   Eustace Moore, MD  simvastatin (ZOCOR) 10 MG tablet Take 10 mg by mouth at bedtime. Patient not taking: Reported on 12/06/2019 01/01/19 02/28/20  [provider]    Family History Family History  Problem Relation Age of Onset  . Bipolar disorder Mother   . Prostate cancer Other     Social History Social History   Tobacco Use  . Smoking status: Never Smoker  . Smokeless tobacco: Never Used  Vaping Use  . Vaping Use: Never used  Substance Use Topics  . Alcohol use: No    Alcohol/week: 0.0 standard drinks  . Drug use: No     Allergies   Patient has no known allergies.   Review of Systems Review of Systems See HPI  Physical Exam Triage Vital Signs ED Triage Vitals  Enc Vitals Group     BP 02/28/20 1050 131/88     Pulse Rate 02/28/20 1050 70     Resp 02/28/20 1050 20     Temp 02/28/20 1050 98.2 F (36.8 C)     Temp Source 02/28/20 1050 Oral     SpO2 02/28/20 1050 100 %     Weight --      Height --      Head Circumference --      Peak Flow --      Pain Score 02/28/20 1048 4     Pain Loc --      Pain Edu? --      Excl. in [place]? --    No data found.  Updated Vital Signs BP 131/88 (BP Location: Right Arm)   Pulse 70   Temp 98.2 F (36.8 C) (Oral)   Resp 20   SpO2 100%       Physical Exam Constitutional:       General: She is not in acute distress.    Appearance: She is well-developed and well-nourished.     Comments: Cradles right wrist close to body.  Mask is in place  HENT:     Head: Normocephalic and atraumatic.     Mouth/Throat:     Mouth: Oropharynx is clear and moist.  Eyes:     Conjunctiva/sclera: Conjunctivae normal.     Pupils: Pupils are equal, round, and reactive to light.  Cardiovascular:     Rate and Rhythm: Normal rate.  Pulmonary:     Effort: Pulmonary effort is normal. No respiratory distress.  Abdominal:     General: There is no distension.     Palpations: Abdomen is soft.  Musculoskeletal:        General: No edema. Normal range of motion.     Cervical back: Normal range of motion.     Comments: Right wrist has mild soft tissue swelling across the dorsum of the carpal region.  This area is warm.  There is diffuse tenderness to palpation of the carpal region.  Patient will extend about 30 degrees, can flex none, just to neutral.  Pain with pronation and supination.  Normal grip.  Normal dexterity.  Normal sensory exam  Skin:    General: Skin is warm and dry.  Neurological:     Mental Status: She is alert.  Psychiatric:        Behavior: Behavior normal.      UC Treatments / Results  Labs (all labs ordered are listed, but only abnormal results are displayed) Labs Reviewed - No data to display  EKG   Radiology No results found.  Procedures Procedures (including critical care time)  Medications Ordered in UC Medications - No data to display  Initial Impression / Assessment and Plan / UC Course  I have reviewed the triage vital signs and the nursing notes.  Pertinent labs & imaging results that were available during my care of the patient were reviewed by me and considered in my medical decision making (see chart for details).    Reviewed OA result with patient.  Home care.  Follow up  PCP  Final Clinical Impressions(s) / UC Diagnoses   Final diagnoses:   Primary osteoarthritis, right wrist  Right wrist pain     Discharge Instructions     Wear brace and take the meloxicam once a day See your PCP in follow up    ED Prescriptions    Medication Sig Dispense Auth. Provider   meloxicam (MOBIC) 7.5 MG tablet Take 1 tablet (7.5 mg total) by mouth daily. 14 tablet Raylene Everts, MD     PDMP not reviewed this encounter.   Raylene Everts, MD 03/01/20 [phone]

## 2020-02-28 NOTE — Discharge Instructions (Signed)
Wear brace and take the meloxicam once a day See your PCP in follow up

## 2020-02-28 NOTE — ED Triage Notes (Signed)
PT C/O: right hand pain onset yest.... denies inj/trauma.... uses cane to assist w/walking and she is right handed  Pain increases w/activity   TAKING MEDS: Acetaminophen w/no relief.   A&O x4... NAD... Ambulatory

## 2020-03-05 ENCOUNTER — Institutional Professional Consult (permissible substitution): Payer: Medicare Other | Admitting: Neurology

## 2020-03-07 ENCOUNTER — Ambulatory Visit: Payer: Medicare Other | Admitting: Family Medicine

## 2020-03-07 ENCOUNTER — Ambulatory Visit: Payer: Medicare Other | Admitting: Family

## 2020-03-12 ENCOUNTER — Other Ambulatory Visit: Payer: Self-pay

## 2020-03-12 ENCOUNTER — Ambulatory Visit: Payer: Medicare HMO | Attending: Family Medicine | Admitting: Physician Assistant

## 2020-03-12 ENCOUNTER — Encounter: Payer: Self-pay | Admitting: Physician Assistant

## 2020-03-12 DIAGNOSIS — Z09 Encounter for follow-up examination after completed treatment for conditions other than malignant neoplasm: Secondary | ICD-10-CM

## 2020-03-12 DIAGNOSIS — I10 Essential (primary) hypertension: Secondary | ICD-10-CM | POA: Diagnosis not present

## 2020-03-12 DIAGNOSIS — E1169 Type 2 diabetes mellitus with other specified complication: Secondary | ICD-10-CM | POA: Diagnosis not present

## 2020-03-12 DIAGNOSIS — M25531 Pain in right wrist: Secondary | ICD-10-CM

## 2020-03-12 DIAGNOSIS — E669 Obesity, unspecified: Secondary | ICD-10-CM | POA: Diagnosis not present

## 2020-03-12 MED ORDER — MELOXICAM 7.5 MG PO TABS: 7.5000 mg | ORAL_TABLET | Freq: Every day | ORAL | 1 refills | Status: DC

## 2020-03-12 NOTE — Progress Notes (Signed)
Patient ID: Kristi Campos, female   DOB: [birth year], 70 y.o.   MRN: [id number]  Virtual Visit via Telephone Note  I connected with Kristi Campos on 03/12/20 at 10:10 AM EST by telephone and verified that I am speaking with the correct person using two identifiers.  Location: Patient: Kristi Campos Provider:  Freeman Caldron, PA-C   I discussed the limitations, risks, security and privacy concerns of performing an evaluation and management service by telephone and the availability of in person appointments. I also discussed with the patient that there may be a patient responsible charge related to this service. The patient expressed understanding and agreed to proceed.  PATIENT visit by telephone virtually in the context of Covid-19 pandemic. Patient location:  home My Location:  [facility] office Persons on the call:  Screened by Boykin Reaper, me and the patient.    History of Present Illness: After UC visit 02/28/2020 for R wrist pain.  Pain is much better but she would like to have more meloxicam on hand if she needed it.  BP was 131/88.  Xray showed arthritis in the R wrist.  Blood sugars running ~70-135; highest is about 200 but rare.  Compliant with meds.    From UC note: HPI Kristi Campos is a 70 y.o. female.  HPI  Woke up with right wrist pain yesterday.  Does not have any idea what caused it.  No activity.  No trauma or fall.  No repetitive motion.  She has never had wrist problems before.  She is never been told she had wrist arthritis.  She does not recall an old wrist fracture.  No numbness in the fingers.  Full use of the fingers.  Pain with any movement of the wrist, especially flexion and supination.  Mild swelling.  Mild warmth  Patient has a history of diabetes and hypertension, per patient they are well controlled.  Last A1c on record is 7.6.  Normal kidney function noted    Observations/Objective:  NAD.  A&Ox3   Assessment and Plan: 1. Right wrist pain improved -  meloxicam (MOBIC) 7.5 MG tablet; Take 1 tablet (7.5 mg total) by mouth daily.  Dispense: 30 tablet; Refill: 1  2. Encounter for examination following treatment at hospital Doing well  3. Diabetes mellitus type 2 in obese (HCC) Last A1C about 3 months ago=7.6.  Continue current regimen and work on diabetic diet.    4. Essential hypertension Continue current regimen-not at goal based on UC reading.  Consider med adjustments    Follow Up Instructions: Assign new PCP in our office and due A1C.  6-8 weeks   I discussed the assessment and treatment plan with the patient. The patient was provided an opportunity to ask questions and all were answered. The patient agreed with the plan and demonstrated an understanding of the instructions.   The patient was advised to call back or seek an in-person evaluation if the symptoms worsen or if the condition fails to improve as anticipated.  I provided 14 minutes of non-face-to-face time during this encounter.   Freeman Caldron, PA-C

## 2020-03-21 ENCOUNTER — Ambulatory Visit: Payer: Medicare HMO | Admitting: Cardiovascular Disease

## 2020-04-17 ENCOUNTER — Telehealth: Payer: Self-pay

## 2020-04-17 NOTE — Telephone Encounter (Signed)
Copied from [place] [id number]. Topic: General - Other >> Apr 17, 2020 10:56 AM Tessa Lerner A wrote: Reason for CRM: John from [employer] has reached out on behalf of Orvan Seen to receive confirmation of a record request sent via Fax on 04/10/20 Please contact to confirm or advise   Returned phone call to [phone] and advised that I was returning John's phone call on behalf of patient and that we had received the records request and had already faxed the records back on the same day. Maudry Mayhew to return phone call with any questions or concerns to [phone].

## 2020-06-06 ENCOUNTER — Ambulatory Visit: Payer: Medicare HMO | Admitting: Cardiovascular Disease

## 2020-08-15 DIAGNOSIS — E1165 Type 2 diabetes mellitus with hyperglycemia: Secondary | ICD-10-CM | POA: Diagnosis not present

## 2020-08-15 DIAGNOSIS — Z7189 Other specified counseling: Secondary | ICD-10-CM | POA: Diagnosis not present

## 2020-08-15 DIAGNOSIS — E1169 Type 2 diabetes mellitus with other specified complication: Secondary | ICD-10-CM | POA: Diagnosis not present

## 2020-08-15 DIAGNOSIS — E278 Other specified disorders of adrenal gland: Secondary | ICD-10-CM | POA: Diagnosis not present

## 2020-08-15 DIAGNOSIS — Z008 Encounter for other general examination: Secondary | ICD-10-CM | POA: Diagnosis not present

## 2020-08-15 DIAGNOSIS — Z79899 Other long term (current) drug therapy: Secondary | ICD-10-CM | POA: Diagnosis not present

## 2020-08-15 DIAGNOSIS — Z0001 Encounter for general adult medical examination with abnormal findings: Secondary | ICD-10-CM | POA: Diagnosis not present

## 2020-08-15 DIAGNOSIS — G4733 Obstructive sleep apnea (adult) (pediatric): Secondary | ICD-10-CM | POA: Diagnosis not present

## 2020-09-26 DIAGNOSIS — M47816 Spondylosis without myelopathy or radiculopathy, lumbar region: Secondary | ICD-10-CM | POA: Diagnosis not present

## 2020-09-26 DIAGNOSIS — M5136 Other intervertebral disc degeneration, lumbar region: Secondary | ICD-10-CM | POA: Diagnosis not present

## 2020-10-13 ENCOUNTER — Ambulatory Visit: Payer: Medicare HMO | Admitting: Critical Care Medicine

## 2020-10-15 DIAGNOSIS — H2513 Age-related nuclear cataract, bilateral: Secondary | ICD-10-CM | POA: Diagnosis not present

## 2020-10-15 DIAGNOSIS — H5203 Hypermetropia, bilateral: Secondary | ICD-10-CM | POA: Diagnosis not present

## 2020-10-15 DIAGNOSIS — H524 Presbyopia: Secondary | ICD-10-CM | POA: Diagnosis not present

## 2020-10-15 DIAGNOSIS — H0288A Meibomian gland dysfunction right eye, upper and lower eyelids: Secondary | ICD-10-CM | POA: Diagnosis not present

## 2020-10-15 DIAGNOSIS — H04123 Dry eye syndrome of bilateral lacrimal glands: Secondary | ICD-10-CM | POA: Diagnosis not present

## 2020-10-15 DIAGNOSIS — H52223 Regular astigmatism, bilateral: Secondary | ICD-10-CM | POA: Diagnosis not present

## 2020-10-15 DIAGNOSIS — E119 Type 2 diabetes mellitus without complications: Secondary | ICD-10-CM | POA: Diagnosis not present

## 2020-10-15 DIAGNOSIS — Z0100 Encounter for examination of eyes and vision without abnormal findings: Secondary | ICD-10-CM | POA: Diagnosis not present

## 2020-10-15 DIAGNOSIS — H35033 Hypertensive retinopathy, bilateral: Secondary | ICD-10-CM | POA: Diagnosis not present

## 2020-10-15 DIAGNOSIS — H0288B Meibomian gland dysfunction left eye, upper and lower eyelids: Secondary | ICD-10-CM | POA: Diagnosis not present

## 2020-10-17 ENCOUNTER — Telehealth: Payer: Self-pay | Admitting: Cardiovascular Disease

## 2020-10-17 ENCOUNTER — Emergency Department (HOSPITAL_COMMUNITY): Payer: Medicare HMO

## 2020-10-17 ENCOUNTER — Observation Stay (HOSPITAL_COMMUNITY)
Admission: EM | Admit: 2020-10-17 | Discharge: 2020-10-18 | Disposition: A | Discharge status: Home / Self Care | Payer: Medicare HMO | Attending: Internal Medicine | Admitting: Internal Medicine

## 2020-10-17 DIAGNOSIS — E782 Mixed hyperlipidemia: Secondary | ICD-10-CM

## 2020-10-17 DIAGNOSIS — E119 Type 2 diabetes mellitus without complications: Secondary | ICD-10-CM | POA: Diagnosis present

## 2020-10-17 DIAGNOSIS — R079 Chest pain, unspecified: Secondary | ICD-10-CM | POA: Diagnosis present

## 2020-10-17 DIAGNOSIS — Z7984 Long term (current) use of oral hypoglycemic drugs: Secondary | ICD-10-CM | POA: Insufficient documentation

## 2020-10-17 DIAGNOSIS — E785 Hyperlipidemia, unspecified: Secondary | ICD-10-CM | POA: Diagnosis not present

## 2020-10-17 DIAGNOSIS — I10 Essential (primary) hypertension: Secondary | ICD-10-CM | POA: Diagnosis not present

## 2020-10-17 DIAGNOSIS — E669 Obesity, unspecified: Secondary | ICD-10-CM | POA: Diagnosis not present

## 2020-10-17 DIAGNOSIS — Z79899 Other long term (current) drug therapy: Secondary | ICD-10-CM | POA: Diagnosis not present

## 2020-10-17 DIAGNOSIS — Z7982 Long term (current) use of aspirin: Secondary | ICD-10-CM | POA: Diagnosis not present

## 2020-10-17 DIAGNOSIS — R0789 Other chest pain: Principal | ICD-10-CM | POA: Insufficient documentation

## 2020-10-17 DIAGNOSIS — E1169 Type 2 diabetes mellitus with other specified complication: Secondary | ICD-10-CM | POA: Diagnosis not present

## 2020-10-17 DIAGNOSIS — R778 Other specified abnormalities of plasma proteins: Secondary | ICD-10-CM | POA: Insufficient documentation

## 2020-10-17 DIAGNOSIS — Z20822 Contact with and (suspected) exposure to covid-19: Secondary | ICD-10-CM | POA: Diagnosis not present

## 2020-10-17 DIAGNOSIS — N179 Acute kidney failure, unspecified: Secondary | ICD-10-CM | POA: Diagnosis not present

## 2020-10-17 DIAGNOSIS — R072 Precordial pain: Secondary | ICD-10-CM | POA: Diagnosis not present

## 2020-10-17 DIAGNOSIS — R0602 Shortness of breath: Secondary | ICD-10-CM | POA: Diagnosis not present

## 2020-10-17 LAB — RESP PANEL BY RT-PCR (FLU A&B, COVID) ARPGX2
Influenza A by PCR: NEGATIVE
Influenza B by PCR: NEGATIVE
SARS Coronavirus 2 by RT PCR: NEGATIVE

## 2020-10-17 LAB — CBC WITH DIFFERENTIAL/PLATELET
Abs Immature Granulocytes: 0.02 10*3/uL (ref 0.00–0.07)
Basophils Absolute: 0 10*3/uL (ref 0.0–0.1)
Basophils Relative: 0 %
Eosinophils Absolute: 0.2 10*3/uL (ref 0.0–0.5)
Eosinophils Relative: 3 %
HCT: 42.5 % (ref 36.0–46.0)
Hemoglobin: 13.8 g/dL (ref 12.0–15.0)
Immature Granulocytes: 0 %
Lymphocytes Relative: 40 %
Lymphs Abs: 2.5 10*3/uL (ref 0.7–4.0)
MCH: 30.8 pg (ref 26.0–34.0)
MCHC: 32.5 g/dL (ref 30.0–36.0)
MCV: 94.9 fL (ref 80.0–100.0)
Monocytes Absolute: 0.5 10*3/uL (ref 0.1–1.0)
Monocytes Relative: 8 %
Neutro Abs: 3 10*3/uL (ref 1.7–7.7)
Neutrophils Relative %: 49 %
Platelets: 276 10*3/uL (ref 150–400)
RBC: 4.48 MIL/uL (ref 3.87–5.11)
RDW: 14.8 % (ref 11.5–15.5)
WBC: 6.1 10*3/uL (ref 4.0–10.5)
nRBC: 0 % (ref 0.0–0.2)

## 2020-10-17 LAB — COMPREHENSIVE METABOLIC PANEL
ALT: 16 U/L (ref 0–44)
AST: 19 U/L (ref 15–41)
Albumin: 3.7 g/dL (ref 3.5–5.0)
Alkaline Phosphatase: 61 U/L (ref 38–126)
Anion gap: 10 (ref 5–15)
BUN: 16 mg/dL (ref 8–23)
CO2: 24 mmol/L (ref 22–32)
Calcium: 9.7 mg/dL (ref 8.9–10.3)
Chloride: 104 mmol/L (ref 98–111)
Creatinine, Ser: 1.33 mg/dL — ABNORMAL HIGH (ref 0.44–1.00)
GFR, Estimated: 43 mL/min — ABNORMAL LOW (ref >60)
Glucose, Bld: 229 mg/dL — ABNORMAL HIGH (ref 70–99)
Potassium: 3.8 mmol/L (ref 3.5–5.1)
Sodium: 138 mmol/L (ref 135–145)
Total Bilirubin: 0.9 mg/dL (ref 0.3–1.2)
Total Protein: 7.8 g/dL (ref 6.5–8.1)

## 2020-10-17 LAB — TROPONIN I (HIGH SENSITIVITY)
Troponin I (High Sensitivity): 5 ng/L (ref <18)
Troponin I (High Sensitivity): 21 ng/L — ABNORMAL HIGH (ref <18)
Troponin I (High Sensitivity): 5 ng/L (ref <18)

## 2020-10-17 LAB — LIPASE, BLOOD: Lipase: 26 U/L (ref 11–51)

## 2020-10-17 MED ORDER — ACETAMINOPHEN 650 MG RE SUPP: 650.0000 mg | Freq: Four times a day (QID) | RECTAL | Status: DC | PRN

## 2020-10-17 MED ORDER — INSULIN ASPART 100 UNIT/ML IJ SOLN
0.0000 [IU] | Freq: Three times a day (TID) | INTRAMUSCULAR | Status: DC
  Administered 2020-10-18 (×2): 2 [IU] via SUBCUTANEOUS

## 2020-10-17 MED ORDER — ACETAMINOPHEN 325 MG PO TABS: 650.0000 mg | ORAL_TABLET | Freq: Four times a day (QID) | ORAL | Status: DC | PRN

## 2020-10-17 MED ORDER — ASPIRIN 81 MG PO CHEW
324.0000 mg | CHEWABLE_TABLET | Freq: Once | ORAL | Status: AC
  Administered 2020-10-17: 324 mg via ORAL
  Filled 2020-10-17: qty 4

## 2020-10-17 MED ORDER — SODIUM CHLORIDE 0.9 % IV BOLUS
500.0000 mL | Freq: Once | INTRAVENOUS | Status: AC
  Administered 2020-10-17: 500 mL via INTRAVENOUS

## 2020-10-17 MED ORDER — NITROGLYCERIN 0.4 MG SL SUBL
0.4000 mg | SUBLINGUAL_TABLET | SUBLINGUAL | Status: DC | PRN
Start: 2020-10-17 — End: 2020-10-18

## 2020-10-17 MED ORDER — LACTATED RINGERS IV SOLN: INTRAVENOUS | Status: DC

## 2020-10-17 NOTE — ED Provider Notes (Signed)
Emergency Medicine Provider Triage Evaluation Note  Kristi Campos , a 70 y.o. female  was evaluated in triage.  Pt complains of pain underneath her left breast that has been ongoing since yesterday at approximately 5 PM she states it is nonradiating and is not associated with nausea vomiting diaphoresis.  She states she feels somewhat short of breath denies any wheezing.  No hemoptysis..  No recent surgeries, hospitalization, long travel, hemoptysis, estrogen containing OCP, cancer history.  No unilateral leg swelling.  No history of PE or VTE.   Review of Systems  Positive: Chest pain, shortness of breath, cough Negative: Fever  Physical Exam  BP 127/79 (BP Location: Right Arm)   Pulse (!) 117   Temp 98.9 F (37.2 C)   Resp 18   SpO2 97%  Gen:   Awake, no distress   Resp:  Normal effort  MSK:   Moves extremities without difficulty  Other:  Mild tachycardia  Medical Decision Making  Medically screening exam initiated at 2:27 PM.  Appropriate orders placed.  Hanin Armbrister was informed that the remainder of the evaluation will be completed by another provider, this initial triage assessment does not replace that evaluation, and the importance of remaining in the ED until their evaluation is complete.    Tedd Sias, [place] 10/17/20 1432    Luna Fuse, MD 10/17/20 1446

## 2020-10-17 NOTE — Progress Notes (Signed)
Brief note regarding preliminary plan, with full H&P to follow:  70 year old female with history of morbid multiple CAD risk factors who is being admitted for further evaluation management of presenting chest pain.  She reports 2 days of intermittent left-sided chest discomfort without radiation, with pain being nonexertional in nature.  Has not previously experienced chest pain similar to this and no known history of underlying CAD.  EKG shows sinus rhythm without evidence of acute ischemic changes.  Initial troponin nonelevated.  Chest x-ray shows no evidence of acute cardiopulmonary process.  Currently chest pain-free.  Has received full dose aspirin x1 in the ED. Will trend serial troponin.  Monitor on telemetry.  As needed sublingual nitroglycerin for any subsequent chest discomfort.  Echocardiogram has been ordered for the morning.  Add on serum magnesium level.  In the setting of presenting atypical chest pain with initial tachycardia, will also add on D-dimer, which, if positive, will plan to pursue CTA chest to rule out acute pulmonary embolism.     Babs Bertin, DO Hospitalist

## 2020-10-17 NOTE — H&P (Signed)
History and Physical    PLEASE NOTE THAT DRAGON DICTATION SOFTWARE WAS USED IN THE CONSTRUCTION OF THIS NOTE.   [name] [id number] DOB: 1950/04/01 DOA: 10/17/2020  PCP: Pcp, No Patient coming from: home   I have personally briefly reviewed patient's old medical records in [place]  Chief Complaint: Chest pain  HPI: Kristi Campos is a 70 y.o. female with medical history significant for type 2 diabetes mellitus, hypertension, hyperlipidemia, who is admitted to [facility] on 10/17/2020 for further evaluation and management of chest pain after presenting from home to [facility] emergency department complaining of such.  The patient reports 2 days of intermittent, nonradiating substernal chest pressure.  She reports that she has experienced 5-6 episodes of this type of chest discomfort over the course the last 2 days, with each episode lasting approximately 10 seconds before spontaneous resolution.  She notes that each of these episodes have occurred at rest, and denies any exacerbation or promotion with exertion.  States that this pain is nonpositional and nonpleuritic, but notes reproducibility of the chest pain with direct palpation over the midline of the anterior chest wall, reproducing the quality and intensity of the intermittent chest discomfort that she has been experiencing over the course of the last 2 days.  As the pain is self-limited with each episode lasting 10 seconds or less, patient reports that she has not taken any sublingual nitroglycerin over the last 2 days.  Denies any history of similar chest discomfort outside of the last 2 days.  She notes that the above episodes are associated with mild nausea in the absence of any vomiting.  She denies any associated shortness of breath, diaphoresis, dizziness, palpitations, presyncope, or syncope.  She notes that she has been experiencing a mild nonproductive cough over the course of the last month in the absence  of any associated hemoptysis, but emphasizes that her chest discomfort over the last 2 days is not prompted or exacerbated by the active coughing.  Denies any associated subjective fever, chills, rigors, or generalized myalgias.  Denies any known prior history of underlying coronary artery disease, while also acknowledging that she has not previously undergone any coronary ischemic evaluation, including no prior stress test or heart cath.  She confirms a history of hypertension, hyperlipidemia, and type 2 diabetes mellitus.  She reports that she is a lifelong non-smoker, and denies any known family history of premature coronary artery disease.  Denies any personal history of DVT/PE or family history of inheritable hypercoagulable state.  Denies any recent trauma, travel, surgery, extended periods of diminished ambulatory activity, or any personal history of malignancy.  Denies any new peripheral edema, lower extremity erythema, calf tenderness.  She confirms that she is currently chest pain-free.   Medical history also notable for chronic low back pain for which she is on daily Mobic.  Denies any history of gastrointestinal bleed.  Notable medications include losartan, which the patient reports she has continued to take while undergoing daily Mobic therapy.  In the setting of intermittent chest pain over the last 2 days associated with nausea, the patient reports decline in oral intake of both food and fluids over that timeframe.  Denies any recent dysuria, gross hematuria, or change in urinary urgency/frequency.  No known recent COVID-19 exposures.  Denies any recent headache, neck stiffness, rhinitis, rhinorrhea, sore throat, wheezing, abdominal pain, diarrhea, or rash.    ED Course:  Vital signs in the ED were notable for the following: Temperature max 98.9,  heart rate initially noted to be 1 17-1 24, with ensuing improvement into the range of 63-86 following administration of IV fluids, as further  detailed below.  Blood pressure 126/75 -139/70; respiratory rate 16-21, oxygen saturation 97 to 100% on room air.  Labs were notable for the following: CMP notable for the following: Sodium 138, potassium 3.8, bicarbonate 24, anion gap 10, BUN 16, creatinine 1.33 relative to baseline creatinine range of 0.8-1.0 as well as most recent prior serum creatinine data point of 0.91 on 12/06/2019, glucose 229, liver enzymes were found to be within normal limits.  Lipase 26.  Initial high-sensitivity troponin I found to be 5.  Urinalysis has been ordered, with result currently pending.  CBC notable for white blood cell count 6100, hemoglobin 13.8.  Screening nasopharyngeal COVID-19 PCR was performed in the ED this evening and found to be negative.  EKG shows sinus rhythm with heart rate 70, normal intervals, and no evidence of T wave or ST changes, including no evidence of ST elevation.  2 view chest x-ray showed no evidence of acute cardiopulmonary process, including no evidence of infiltrate, edema, effusion, or pneumothorax.  While in the ED, the following were administered: Full dose aspirin x1, 500 cc normal saline bolus.  Subsequently, the patient was admitted for overnight observation for further evaluation and management of presenting chest pain.     Review of Systems: As per HPI otherwise 10 point review of systems negative.   Past Medical History:  Diagnosis Date   Diabetes mellitus without complication ([place])    Fibroid uterus    Hypertension    Vaginal Pap smear, abnormal     Past Surgical History:  Procedure Laterality Date   I & D EXTREMITY Right 08/09/2013   Procedure: MINOR IRRIGATION AND DEBRIDEMENT EXTREMITY;  Surgeon: Cammie Sickle, MD;  Location: [place];  Service: Orthopedics;  Laterality: Right;  long       wound class 4    Social History:  reports that she has never smoked. She has never used smokeless tobacco. She reports that she does not drink alcohol  and does not use drugs.   No Known Allergies  Family History  Problem Relation Age of Onset   Bipolar disorder Mother    Prostate cancer Other     Family history reviewed and not pertinent    Prior to Admission medications   Medication Sig Start Date End Date Taking? Authorizing Provider  ACCU-CHEK GUIDE test strip See admin instructions. Patient not taking: Reported on 12/06/2019 07/04/19   [provider]  aspirin EC 81 MG tablet Take 81 mg by mouth daily.    [provider]  atorvastatin (LIPITOR) 10 MG tablet Take 1 tablet (10 mg total) by mouth daily. 12/06/19   Argentina Donovan, PA-C  glipiZIDE (GLUCOTROL) 5 MG tablet TAKE 1 TABLET (5 MG TOTAL) BY MOUTH DAILY BEFORE BREAKFAST. 12/06/19 12/05/20  Argentina Donovan, PA-C  losartan-hydrochlorothiazide (HYZAAR) 100-25 MG tablet TAKE 1 TABLET BY MOUTH DAILY. 12/06/19 12/05/20  Argentina Donovan, PA-C  meloxicam (MOBIC) 7.5 MG tablet Take 1 tablet (7.5 mg total) by mouth daily. 03/12/20   Argentina Donovan, PA-C  simvastatin (ZOCOR) 10 MG tablet Take 10 mg by mouth at bedtime. Patient not taking: Reported on 12/06/2019 01/01/19 02/28/20  [provider]     Objective    Physical Exam: Vitals:   10/17/20 1823 10/17/20 1915 10/17/20 1930 10/17/20 2015  BP: 138/82 139/69 126/75 138/69  Pulse: 87 76 86 72  Resp: 18 (!) '21 13 16  '$ Temp: 98.2 F (36.8 C)     TempSrc: Oral     SpO2: 100% 99% 99% 100%    General: appears to be stated age; alert, oriented Skin: warm, dry, no rash Head:  AT/[place] Mouth:  Oral mucosa membranes appear moist, normal dentition Neck: supple; trachea midline Heart:  RRR; did not appreciate any M/R/G Chest: Reproducible chest pain with palpation over the midline of the anterior aspect of the chest wall without evidence of paradoxical chest rise and fall. Lungs: CTAB, did not appreciate any wheezes, rales, or rhonchi Abdomen: + BS; soft, ND, NT Vascular: 2+ pedal pulses b/l; 2+ radial  pulses b/l Extremities: no peripheral edema, no muscle wasting Neuro: strength and sensation intact in upper and lower extremities b/l    Labs on Admission: I have personally reviewed following labs and imaging studies  CBC: Recent Labs  Lab 10/17/20 1432  WBC 6.1  NEUTROABS 3.0  HGB 13.8  HCT 42.5  MCV 94.9  PLT [id number]   Basic Metabolic Panel: Recent Labs  Lab 10/17/20 1432  NA 138  K 3.8  CL 104  CO2 24  GLUCOSE 229*  BUN 16  CREATININE 1.33*  CALCIUM 9.7   GFR: CrCl cannot be calculated (Unknown ideal weight.). Liver Function Tests: Recent Labs  Lab 10/17/20 1432  AST 19  ALT 16  ALKPHOS 61  BILITOT 0.9  PROT 7.8  ALBUMIN 3.7   Recent Labs  Lab 10/17/20 1432  LIPASE 26   No results for input(s): AMMONIA in the last 168 hours. Coagulation Profile: No results for input(s): INR, PROTIME in the last 168 hours. Cardiac Enzymes: No results for input(s): CKTOTAL, CKMB, CKMBINDEX, TROPONINI in the last 168 hours. BNP (last 3 results) No results for input(s): PROBNP in the last 8760 hours. HbA1C: No results for input(s): HGBA1C in the last 72 hours. CBG: No results for input(s): GLUCAP in the last 168 hours. Lipid Profile: No results for input(s): CHOL, HDL, LDLCALC, TRIG, CHOLHDL, LDLDIRECT in the last 72 hours. Thyroid Function Tests: No results for input(s): TSH, T4TOTAL, FREET4, T3FREE, THYROIDAB in the last 72 hours. Anemia Panel: No results for input(s): VITAMINB12, FOLATE, FERRITIN, TIBC, IRON, RETICCTPCT in the last 72 hours. Urine analysis:    Component Value Date/Time   COLORURINE STRAW (A) 01/13/2019 1034   APPEARANCEUR Clear 07/11/2019 0907   LABSPEC 1.012 01/13/2019 1034   PHURINE 6.0 01/13/2019 1034   GLUCOSEU Negative 07/11/2019 0907   HGBUR NEGATIVE 01/13/2019 1034   BILIRUBINUR Negative 07/11/2019 [place] 01/13/2019 1034   PROTEINUR Negative 07/11/2019 0907   PROTEINUR NEGATIVE 01/13/2019 1034   UROBILINOGEN 0.2  12/18/2014 2301   NITRITE Negative 07/11/2019 0907   NITRITE NEGATIVE 01/13/2019 1034   LEUKOCYTESUR Negative 07/11/2019 0907   LEUKOCYTESUR NEGATIVE 01/13/2019 1034    Radiological Exams on Admission: DG Chest 2 View  Result Date: 10/17/2020 CLINICAL DATA:  Chest pain, shortness of breath EXAM: CHEST - 2 VIEW COMPARISON:  02/28/2009 FINDINGS: The heart size and mediastinal contours are within normal limits. No focal airspace consolidation, pleural effusion, or pneumothorax. The visualized skeletal structures are unremarkable. IMPRESSION: No active cardiopulmonary disease. Electronically Signed   By: Davina Poke D.O.   On: 10/17/2020 15:12     EKG: Independently reviewed, with result as described above.    Assessment/Plan   Darriana Ottey is a 70 y.o. female with medical history significant for type 2  diabetes mellitus, hypertension, hyperlipidemia, who is admitted to [facility] on 10/17/2020 for further evaluation and management of chest pain after presenting from home to [facility] emergency department complaining of such.   Principal Problem:   Chest pain Active Problems:   Diabetes mellitus type 2 in obese [facility])   Essential hypertension   Hyperlipidemia   AKI (acute kidney injury) ([place])      #) Atypical Chest Pain: 2 days of intermittent nonexertional chest discomfort that appears reproducible with direct palpation of the anterior chest wall episodes lasting 10 seconds or less before spontaneous resolution, without any interval administration of nitroglycerin for evaluation of response to this pharmacologic measure, with this presentation appearing atypical for ACS.  However, given the presence of a degree of substernal chest pressure that is completely new for the patient over the last 2 days, in the context of multiple CAD risk factors, including type 2 diabetes mellitus, hypertension, hyperlipidemia, and age, no prior cardiac ischemic evaluation, and presenting  heart score of 6 conveying a moderate risk of major acute cardiac event over the ensuing 6 weeks, the patient is being admitted for overnight observation in order to rule out ACS.  Of note, initial high-sensitivity troponin found to be nonelevated, which is reassuring from an ACS standpoint given that the patient's intermittent chest pain has been occurring for approximately 48 hours, providing sufficient time for elevation of troponin in the setting of an ACS related scenario.  EKG shows no evidence of acute ischemic changes, including no evidence of STEMI, while chest x-ray shows no evidence of acute cardiopulmonary process, including no evidence of pneumothorax.  Patient confirms that she is currently chest pain-free.  Full dose aspirin was administered in the ED this evening.  Should patient subsequently rule-out for ACS, can then consider possibility of discussing case with cardiology for assistance in determining the necessity/nature of additional ischemic evaluation in the context of patient's age, gender, and risk factors.   Aside from ACS, differential also includes acute pulmonary embolism given initial tachycardia.  will check D-dimer in an effort to make use of its high negative predictive value, with plan to pursue CTA chest if D-dimer found to be elevated.  Differential also includes musculoskeletal possibilities, such as costochondritis, particularly given the reproducibility of the patient's chest discomfort with palpation over the anterior chest wall in the context of preceding nonproductive cough over the last 1 month.  Differential also includes GERD versus gastritis in the setting of daily NSAID use versus anxiety.  Of note, lipase not elevated.   Plan: trend serial troponin. Monitor on telemetry. PRN sublingual nitroglycerin. Consider prn Morphine for pain not relieved by SL NG. PRN EKG for subsequent episodes of chest pain. Check serum Mg level and repeat BMP in the morning. Repeat CBC in  the AM.  Resume home Lipitor.  Of note, will be holding home losartan for now in the setting of concomitant acute kidney injury.  Check D-dimer, as above.  Echocardiogram has been ordered for the morning to assess for focal wall motion abnormalities as a means of helping to further guide the necessity of additional ischemic work-up should the patient rule out for ACS.  May also consider trying GI cocktail.      #) Acute kidney injury: Relative to baseline creatinine ranges 0.8-1.0, with most recent prior serum creatinine noted to be 0.91 in October 2021, presenting serum creatinine found to be 1.33.  Suspect an element of prerenal contribution given the patient's report of decline  in oral intake including that of water over the course of the last 2 days in the setting of her intermittent chest pain and associated nausea, with potential pharmacologic contribution in the setting of tandem use of losartan as well as Mobic.  Urinalysis with microscopy has been ordered, with result currently pending.  Plan: Follow-up result urinalysis with microscopy.  Add on random urine sodium as well as random urine creatinine.  Hold home losartan and Mobic for now.  Gentle lactated Ringer's at 50 cc/h overnight.  Monitor strict I's and O's and daily weights.  Repeat BMP in the morning.      #) Type 2 diabetes mellitus: Documented history of such.  Non-insulin-dependent as an outpatient.  On glipizide as as her sole outpatient oral hypoglycemic agent.  Presenting blood sugar per CMP noted to be 229, in the absence of any associated evidence of DKA.   Plan: Hold home glipizide during this hospitalization.  Accu-Cheks before every meal and at bedtime with low-dose sliding scale insulin.       #) Essential hypertension: Documented history of such, on losartan as well as HCTZ at home.  Presenting blood pressures noted to be normotensive.  In the setting of suspected mild dehydration, will hold home HCTZ for now.   Additionally, in the setting of presenting acute kidney injury, will hold home losartan for now as well.  Plan: Hold home HCTZ and losartan for now, as above.  Close monitoring of ensuing blood pressure via routine vital signs.       #) Hyperlipidemia: On low-dose atorvastatin as an outpatient.  Plan: Continue home statin, with next dose to occur now in the setting of presenting chest pain.      DVT prophylaxis: SCDs Code Status: Full code Family Communication: none Disposition Plan: Per Rounding Team Consults called: none  Admission status: Observation; cardiac telemetry     Of note, this patient was added by me to the following Admit List/Treatment Team: [email or web address].      PLEASE NOTE THAT DRAGON DICTATION SOFTWARE WAS USED IN THE CONSTRUCTION OF THIS NOTE.   [facility] Pager [phone] From [place]  Otherwise, please contact night-coverage  [email or web address] Password [facility]   10/17/2020, 9:12 PM

## 2020-10-17 NOTE — Telephone Encounter (Signed)
Returned call to patient of Dr. Gwenlyn Found who reports chest pain yesterday. She reports left-sided pain, under breast, lasting a few seconds,described as pressure. She took ASA '81mg'$  for symptoms. No other reported symptoms.   She occasionally checks BP at home but last reading was last week.   She has been scheduled for appointment 10/20/20 with Denyse Amass NP She was advised to see ED eval for worsening symptoms between now and her appointment

## 2020-10-17 NOTE — ED Provider Notes (Signed)
[place] EMERGENCY DEPARTMENT Provider Note   CSN: [id number] Arrival date & time: 10/17/20  1412     History Chief Complaint  Patient presents with   Chest Pain    Kristi Campos is a 70 y.o. female.  Patient is a 70 yo female presenting for chest pain. Patient admits to sternal chest pain, non radiating, intermittent, lasting seconds, and occurring at rest. Admits to coughing. Denies sob, fevers, chills, nasal congestion, or sore throat. Denies leg swelling or orthopnea. Denies hx of DVT/PE, recent immobilization, recent surgeries, or hormone use.   Cardiac risk factors: HTN, DM  The history is provided by the patient. No language interpreter was used.  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   Peripheral arterial disease ([place]) 10/03/2019   Essential hypertension 10/03/2019   Hyperlipidemia 10/03/2019   Diabetes mellitus type 2 in obese ([place]) 11/24/2018   Chronic right-sided low back pain without sciatica 10/24/2018    Past Surgical History:  Procedure Laterality Date   I & D EXTREMITY Right 08/09/2013   Procedure: MINOR IRRIGATION AND DEBRIDEMENT EXTREMITY;  Surgeon: Cammie Sickle, MD;  Location: [facility];  Service: Orthopedics;  Laterality: Right;  long       wound class 4     OB History     Gravida  6   Para  4   Term  4   Preterm  0   AB  0   Living  4      SAB  0   IAB  0   Ectopic  0   Multiple  0   Live Births              Family History  Problem Relation Age of Onset   Bipolar disorder Mother    Prostate  cancer Other     Social History   Tobacco Use   Smoking status: Never   Smokeless tobacco: Never  Vaping Use   Vaping Use: Never used  Substance Use Topics   Alcohol use: No    Alcohol/week: 0.0 standard drinks   Drug use: No    Home Medications Prior to Admission medications   Medication Sig Start Date End Date Taking? Authorizing Provider  ACCU-CHEK GUIDE test strip See admin instructions. Patient not taking: Reported on 12/06/2019 07/04/19   [provider]  aspirin EC 81 MG tablet Take 81 mg by mouth daily.    [provider]  atorvastatin (LIPITOR) 10 MG tablet Take 1 tablet (10 mg total) by mouth daily. 12/06/19   Argentina Donovan, PA-C  glipiZIDE (GLUCOTROL) 5 MG tablet TAKE 1 TABLET (5 MG TOTAL) BY MOUTH DAILY BEFORE BREAKFAST. 12/06/19 12/05/20  Argentina Donovan, PA-C  losartan-hydrochlorothiazide (HYZAAR) 100-25 MG tablet TAKE 1 TABLET BY MOUTH DAILY. 12/06/19 12/05/20  Argentina Donovan, PA-C  meloxicam (MOBIC) 7.5 MG tablet Take 1 tablet (7.5 mg total) by mouth daily. 03/12/20   Argentina Donovan, PA-C  simvastatin (ZOCOR) 10 MG tablet Take 10 mg by mouth at bedtime. Patient not  taking: Reported on 12/06/2019 01/01/19 02/28/20  [provider]    Allergies    Patient has no known allergies.  Review of Systems   Review of Systems  Constitutional:  Negative for chills and fever.  HENT:  Negative for ear pain and sore throat.   Eyes:  Negative for pain and visual disturbance.  Respiratory:  Positive for cough. Negative for shortness of breath.   Cardiovascular:  Positive for chest pain. Negative for palpitations.  Gastrointestinal:  Negative for abdominal pain and vomiting.  Genitourinary:  Negative for dysuria and hematuria.  Musculoskeletal:  Negative for arthralgias and back pain.  Skin:  Negative for color change and rash.  Neurological:  Negative for seizures and syncope.  All other systems reviewed and are negative.  Physical  Exam Updated Vital Signs BP 138/82 (BP Location: Right Arm)   Pulse 87   Temp 98.2 F (36.8 C) (Oral)   Resp 18   SpO2 100%   Physical Exam Vitals and nursing note reviewed.  Constitutional:      General: She is not in acute distress.    Appearance: She is well-developed.  HENT:     Head: Normocephalic and atraumatic.  Eyes:     Conjunctiva/sclera: Conjunctivae normal.  Cardiovascular:     Rate and Rhythm: Normal rate and regular rhythm.     Heart sounds: No murmur heard. Pulmonary:     Effort: Pulmonary effort is normal. No respiratory distress.     Breath sounds: Normal breath sounds.  Abdominal:     Palpations: Abdomen is soft.     Tenderness: There is no abdominal tenderness.  Musculoskeletal:     Cervical back: Neck supple.  Skin:    General: Skin is warm and dry.  Neurological:     Mental Status: She is alert.    ED Results / Procedures / Treatments   Labs (all labs ordered are listed, but only abnormal results are displayed) Labs Reviewed  COMPREHENSIVE METABOLIC PANEL - Abnormal; Notable for the following components:      Result Value   Glucose, Bld 229 (*)    Creatinine, Ser 1.33 (*)    GFR, Estimated 43 (*)    All other components within normal limits  TROPONIN I (HIGH SENSITIVITY) - Abnormal; Notable for the following components:   Troponin I (High Sensitivity) 21 (*)    All other components within normal limits  RESP PANEL BY RT-PCR (FLU A&B, COVID) ARPGX2  CBC WITH DIFFERENTIAL/PLATELET  LIPASE, BLOOD  URINALYSIS, ROUTINE W REFLEX MICROSCOPIC  TROPONIN I (HIGH SENSITIVITY)    EKG None  Radiology DG Chest 2 View  Result Date: 10/17/2020 CLINICAL DATA:  Chest pain, shortness of breath EXAM: CHEST - 2 VIEW COMPARISON:  02/28/2009 FINDINGS: The heart size and mediastinal contours are within normal limits. No focal airspace consolidation, pleural effusion, or pneumothorax. The visualized skeletal structures are unremarkable. IMPRESSION: No active  cardiopulmonary disease. Electronically Signed   By: Davina Poke D.O.   On: 10/17/2020 15:12    Procedures Procedures   Medications Ordered in ED Medications  sodium chloride 0.9 % bolus 500 mL (has no administration in time range)  aspirin chewable tablet 324 mg (has no administration in time range)    ED Course  I have reviewed the triage vital signs and the nursing notes.  Pertinent labs & imaging results that were available during my care of the patient were reviewed by me and considered in my medical decision making (see chart for details).  MDM Rules/Calculators/A&P                          7:52 PM 70 yo female presenting for chest pain. Patient is Aox3, no acute distress, afebrile, stable vitals. Physical exam demonstrates equal bilateral breath sounds with no adventitious lung sounds. EKG demonstrates sinus tachycardia. NO STEMI or NSTEMI.  Laboratory studies demonstrate AKI possibly secondary to dehydration. IVF given with improvement of tachycardia.   Initial troponin 5, repeat 21. Third trop pending. ASA given. Repeat EKG demonstrates NSR, no st segment elevation or depression. No concerning t wave inversions.   Patient recommended for admission with cardiology eval at this time for chest pain with elevated troponins with Heart Score of 6. Patient accepted by hospitalist Dr. Velia Meyer.     Final Clinical Impression(s) / ED Diagnoses Final diagnoses:  Chest pain, unspecified type  Elevated troponin  AKI (acute kidney injury) [facility])    Rx / DC Orders ED Discharge Orders     None        Lianne Cure, DO [id number]

## 2020-10-17 NOTE — ED Triage Notes (Signed)
Patient here with complaint of chest pain that started yesterday, described as sharp 3/10 pain under left breast.  No dyspnea, no nausea.

## 2020-10-17 NOTE — Telephone Encounter (Signed)
Pt c/o of Chest Pain: STAT if CP now or developed within 24 hours  1. Are you having CP right now? Not at this time  2. Are you experiencing any other symptoms (ex. SOB, nausea, vomiting, sweating)? Headache yesterday, not today  3. How long have you been experiencing CP? yesterday  4. Is your CP continuous or coming and going? Comes and goes  5. Have you taken Nitroglycerin? no ? patient would like to be seen

## 2020-10-18 ENCOUNTER — Observation Stay (HOSPITAL_BASED_OUTPATIENT_CLINIC_OR_DEPARTMENT_OTHER): Payer: Medicare HMO

## 2020-10-18 ENCOUNTER — Other Ambulatory Visit: Payer: Self-pay

## 2020-10-18 ENCOUNTER — Encounter (HOSPITAL_COMMUNITY): Payer: Self-pay | Admitting: Internal Medicine

## 2020-10-18 DIAGNOSIS — N179 Acute kidney failure, unspecified: Secondary | ICD-10-CM | POA: Diagnosis present

## 2020-10-18 DIAGNOSIS — R079 Chest pain, unspecified: Secondary | ICD-10-CM | POA: Diagnosis not present

## 2020-10-18 LAB — CBC
HCT: 37.9 % (ref 36.0–46.0)
Hemoglobin: 12.7 g/dL (ref 12.0–15.0)
MCH: 30.8 pg (ref 26.0–34.0)
MCHC: 33.5 g/dL (ref 30.0–36.0)
MCV: 91.8 fL (ref 80.0–100.0)
Platelets: 239 10*3/uL (ref 150–400)
RBC: 4.13 MIL/uL (ref 3.87–5.11)
RDW: 14.7 % (ref 11.5–15.5)
WBC: 6.5 10*3/uL (ref 4.0–10.5)
nRBC: 0 % (ref 0.0–0.2)

## 2020-10-18 LAB — TROPONIN I (HIGH SENSITIVITY)
Troponin I (High Sensitivity): 4 ng/L (ref <18)
Troponin I (High Sensitivity): 6 ng/L (ref <18)

## 2020-10-18 LAB — COMPREHENSIVE METABOLIC PANEL
ALT: 13 U/L (ref 0–44)
AST: 18 U/L (ref 15–41)
Albumin: 3.2 g/dL — ABNORMAL LOW (ref 3.5–5.0)
Alkaline Phosphatase: 56 U/L (ref 38–126)
Anion gap: 8 (ref 5–15)
BUN: 16 mg/dL (ref 8–23)
CO2: 24 mmol/L (ref 22–32)
Calcium: 9.2 mg/dL (ref 8.9–10.3)
Chloride: 106 mmol/L (ref 98–111)
Creatinine, Ser: 0.93 mg/dL (ref 0.44–1.00)
GFR, Estimated: >60 mL/min (ref >60)
Glucose, Bld: 171 mg/dL — ABNORMAL HIGH (ref 70–99)
Potassium: 3.5 mmol/L (ref 3.5–5.1)
Sodium: 138 mmol/L (ref 135–145)
Total Bilirubin: 0.5 mg/dL (ref 0.3–1.2)
Total Protein: 6.8 g/dL (ref 6.5–8.1)

## 2020-10-18 LAB — ECHOCARDIOGRAM COMPLETE
AR max vel: 2.6 cm2
AV Area VTI: 2.46 cm2
AV Area mean vel: 2.34 cm2
AV Mean grad: 3 mmHg
AV Peak grad: 5.9 mmHg
Ao pk vel: 1.21 m/s
Area-P 1/2: 2.83 cm2
Height: 63 in
MV VTI: 1.85 cm2
S' Lateral: 3.1 cm
Weight: 3080 oz

## 2020-10-18 LAB — GLUCOSE, CAPILLARY
Glucose-Capillary: 155 mg/dL — ABNORMAL HIGH (ref 70–99)
Glucose-Capillary: 157 mg/dL — ABNORMAL HIGH (ref 70–99)

## 2020-10-18 LAB — MAGNESIUM
Magnesium: 2.2 mg/dL (ref 1.7–2.4)
Magnesium: 2.2 mg/dL (ref 1.7–2.4)

## 2020-10-18 LAB — HIV ANTIBODY (ROUTINE TESTING W REFLEX): HIV Screen 4th Generation wRfx: NONREACTIVE

## 2020-10-18 LAB — D-DIMER, QUANTITATIVE: D-Dimer, Quant: 0.35 ug/mL-FEU (ref 0.00–0.50)

## 2020-10-18 MED ORDER — ASPIRIN EC 81 MG PO TBEC
81.0000 mg | DELAYED_RELEASE_TABLET | Freq: Every day | ORAL | Status: DC
  Administered 2020-10-18: 81 mg via ORAL
  Filled 2020-10-18: qty 1

## 2020-10-18 MED ORDER — NITROGLYCERIN 0.4 MG SL SUBL: 0.4000 mg | SUBLINGUAL_TABLET | SUBLINGUAL | 0 refills | Status: AC | PRN

## 2020-10-18 MED ORDER — POTASSIUM CHLORIDE CRYS ER 20 MEQ PO TBCR
40.0000 meq | EXTENDED_RELEASE_TABLET | Freq: Once | ORAL | Status: AC
  Administered 2020-10-18: 40 meq via ORAL
  Filled 2020-10-18: qty 2

## 2020-10-18 MED ORDER — MELOXICAM 7.5 MG PO TABS
7.5000 mg | ORAL_TABLET | Freq: Every day | ORAL | Status: DC
  Filled 2020-10-18: qty 1

## 2020-10-18 MED ORDER — PANTOPRAZOLE SODIUM 40 MG PO TBEC: 40.0000 mg | DELAYED_RELEASE_TABLET | Freq: Every day | ORAL | 0 refills | Status: DC

## 2020-10-18 MED ORDER — ATORVASTATIN CALCIUM 10 MG PO TABS: 10.0000 mg | ORAL_TABLET | Freq: Every day | ORAL | Status: DC

## 2020-10-18 MED ORDER — PANTOPRAZOLE SODIUM 40 MG PO TBEC
40.0000 mg | DELAYED_RELEASE_TABLET | Freq: Every day | ORAL | Status: DC
  Administered 2020-10-18: 40 mg via ORAL
  Filled 2020-10-18: qty 1

## 2020-10-18 NOTE — Evaluation (Signed)
Physical Therapy Evaluation Patient Details Name: Kristi Campos MRN: [id number] DOB: [birth year] Today's Date: 10/18/2020   History of Present Illness  70 y.o. female with medical history significant for type 2 diabetes mellitus, hypertension, hyperlipidemia, who is admitted to [facility] on 10/17/2020 for further evaluation and management of chest pain.   Clinical Impression  PT eval complete. PTA pt lived alone, mod I mobility using rollator vs SPC. Pt's daughter lives next door and assists pt as needed. On eval, pt required min guard assist transfers, and min guard assist ambulation 150' with RW. She presents with festinating/shuffle gait pattern and decreased balance. Plan is for d/c home today.     Follow Up Recommendations Home health PT    Equipment Recommendations  None recommended by PT    Recommendations for Other Services       Precautions / Restrictions Precautions Precautions: Fall      Mobility  Bed Mobility               General bed mobility comments: Pt OOB on arrival.    Transfers Overall transfer level: Needs assistance Equipment used: Ambulation equipment used Transfers: Sit to/from Stand;Stand Pivot Transfers Sit to Stand: Min guard Stand pivot transfers: Min guard       General transfer comment: min guard for safety  Ambulation/Gait Ambulation/Gait assistance: Min guard Gait Distance (Feet): 150 Feet Assistive device: Rolling [place] (2 wheeled) Gait Pattern/deviations: Step-through pattern;Decreased stride length;Shuffle;Festinating Gait velocity: decreased Gait velocity interpretation: <1.31 ft/sec, indicative of household ambulator General Gait Details: cues for proximity to RW. Fatigues quickly.  Stairs            Wheelchair Mobility    Modified Rankin (Stroke Patients Only)       Balance Overall balance assessment: Needs assistance Sitting-balance support: No upper extremity supported;Feet supported Sitting  balance-Leahy Scale: Good     Standing balance support: Bilateral upper extremity supported;No upper extremity supported;During functional activity Standing balance-Leahy Scale: Fair Standing balance comment: RW for increased ambulation distances                             Pertinent Vitals/Pain Pain Assessment: No/denies pain    Home Living Family/patient expects to be discharged to:: Private residence Living Arrangements: Alone (daughter lives next door) Available Help at Discharge: Family;Available 24 hours/day Type of Home: House Home Access: Level entry     Home Layout: One level Home Equipment: Cane - single point;[place]o - 4 wheels;Shower seat      Prior Function Level of Independence: Independent with assistive device(s)         Comments: Cane for ambulation. Pt recently transitioned to using rollator in the house. Son drives pt to grocery store and doctor appts.     Hand Dominance        Extremity/Trunk Assessment   Upper Extremity Assessment Upper Extremity Assessment: Generalized weakness    Lower Extremity Assessment Lower Extremity Assessment: Generalized weakness    Cervical / Trunk Assessment Cervical / Trunk Assessment: Kyphotic  Communication   Communication: No difficulties  Cognition Arousal/Alertness: Awake/alert Behavior During Therapy: WFL for tasks assessed/performed Overall Cognitive Status: Within Functional Limits for tasks assessed                                        General Comments      Exercises  Assessment/Plan    PT Assessment All further PT needs can be met in the next venue of care  PT Problem List Decreased strength;Decreased mobility;Decreased activity tolerance;Decreased balance       PT Treatment Interventions      PT Goals (Current goals can be found in the Care Plan section)  Acute Rehab PT Goals Patient Stated Goal: home PT Goal Formulation: All assessment and education  complete, DC therapy    Frequency     Barriers to discharge        Co-evaluation               AM-PAC PT "6 Clicks" Mobility  Outcome Measure Help needed turning from your back to your side while in a flat bed without using bedrails?: None Help needed moving from lying on your back to sitting on the side of a flat bed without using bedrails?: None Help needed moving to and from a bed to a chair (including a wheelchair)?: A Little Help needed standing up from a chair using your arms (e.g., wheelchair or bedside chair)?: A Little Help needed to walk in hospital room?: A Little Help needed climbing 3-5 steps with a railing? : A Little 6 Click Score: 20    End of Session Equipment Utilized During Treatment: Gait belt Activity Tolerance: Patient tolerated treatment well Patient left: in chair;with call bell/phone within reach Nurse Communication: Mobility status PT Visit Diagnosis: Muscle weakness (generalized) (M62.81);Unsteadiness on feet (R26.81)    Time: 1610-9604 PT Time Calculation (min) (ACUTE ONLY): 16 min   Charges:   PT Evaluation $PT Eval Low Complexity: 1 Low          Lorrin Goodell, PT  Office # [phone] Pager [phone]   Lorriane Shire 10/18/2020, 2:42 PM

## 2020-10-18 NOTE — Progress Notes (Signed)
  Echocardiogram 2D Echocardiogram has been performed.  Kristi Campos 10/18/2020, 10:41 AM

## 2020-10-18 NOTE — Progress Notes (Signed)
Went over discharge instructions with patient at the bedside. Patient made aware of medication changes and follow up visits with PCP and cardiology. PIV removed by NT. Patient tolerated well. Tele monitor has been removed and CCMD has been notified. Patient is getting dressed and transport has been called.

## 2020-10-18 NOTE — Discharge Summary (Signed)
[facility]  Physician Discharge Summary   Patient ID: Kristi Campos MRN: [id number] DOB/AGE: 1950-12-15 70 y.o.  Admit date: 10/17/2020 Discharge date: 10/18/2020    PCP: Pcp, No  DISCHARGE DIAGNOSES:  Atypical chest pain Diabetes mellitus type 2 Essential hypertension Dyslipidemia Acute kidney injury, resolved  RECOMMENDATIONS FOR OUTPATIENT FOLLOW UP: Patient has an appointment to see cardiology on 8/22  Home Health: Home health PT Equipment/Devices: None  CODE STATUS: Full code  DISCHARGE CONDITION: fair  Diet recommendation: As before  INITIAL HISTORY: 70 y.o. female with medical history significant for type 2 diabetes mellitus, hypertension, hyperlipidemia, who is admitted to [facility] on 10/17/2020 for further evaluation and management of chest pain after presenting from home to [facility] emergency department complaining of such.  She was hospitalized for further management  Consultations: None  Procedures: Transthoracic echocardiogram (see report below)   HOSPITAL COURSE:   Patient presented with chest pain which was thought to be atypical.   She ruled out for acute coronary syndrome.  EKG did not show any ischemic changes.  D-dimer was normal.  Echocardiogram was done which showed normal systolic function without any wall motion abnormalities.  Patient had previously called her cardiologist and an appointment was made for her on 8/22.  She was encouraged to keep this appointment.  She was asked to continue with her home medications. She did admit to having acid reflux.  She takes Tums at home.  She was started on a PPI.   Patient did have mildly elevated creatinine for which she was given IV fluids with improvement.  Diabetes and essential hypertension remained stable. Continued on statin.  Obesity Estimated body mass index is 34.1 kg/m as calculated from the following:   Height as of this encounter: '5\' 3"'$  (1.6 m).   Weight as of this  encounter: 87.3 kg.     PERTINENT LABS:  The results of significant diagnostics from this hospitalization (including imaging, microbiology, ancillary and laboratory) are listed below for reference.    Microbiology: Recent Results (from the past 240 hour(s))  Resp Panel by RT-PCR (Flu A&B, Covid) Nasopharyngeal Swab     Status: None   Collection Time: 10/17/20  7:41 PM   Specimen: Nasopharyngeal Swab; Nasopharyngeal(NP) swabs in vial transport medium  Result Value Ref Range Status   SARS Coronavirus 2 by RT PCR NEGATIVE NEGATIVE Final    Comment: (NOTE) SARS-CoV-2 target nucleic acids are NOT DETECTED.  The SARS-CoV-2 RNA is generally detectable in upper respiratory specimens during the acute phase of infection. The lowest concentration of SARS-CoV-2 viral copies this assay can detect is 138 copies/mL. A negative result does not preclude SARS-Cov-2 infection and should not be used as the sole basis for treatment or other patient management decisions. A negative result may occur with  improper specimen collection/handling, submission of specimen other than nasopharyngeal swab, presence of viral mutation(s) within the areas targeted by this assay, and inadequate number of viral copies(<138 copies/mL). A negative result must be combined with clinical observations, patient history, and epidemiological information. The expected result is Negative.  Fact Sheet for Patients:  EntrepreneurPulse.com.au  Fact Sheet for Healthcare Providers:  IncredibleEmployment.be  This test is no t yet approved or cleared by the [place] FDA and  has been authorized for detection and/or diagnosis of SARS-CoV-2 by FDA under an Emergency Use Authorization (EUA). This EUA will remain  in effect (meaning this test can be used) for the duration of the COVID-19 declaration under Section 564(b)(1) of  the Act, 21 U.S.C.section 360bbb-3(b)(1), unless the authorization  is terminated  or revoked sooner.       Influenza A by PCR NEGATIVE NEGATIVE Final   Influenza B by PCR NEGATIVE NEGATIVE Final    Comment: (NOTE) The Xpert Xpress SARS-CoV-2/FLU/RSV plus assay is intended as an aid in the diagnosis of influenza from Nasopharyngeal swab specimens and should not be used as a sole basis for treatment. Nasal washings and aspirates are unacceptable for Xpert Xpress SARS-CoV-2/FLU/RSV testing.  Fact Sheet for Patients: EntrepreneurPulse.com.au  Fact Sheet for Healthcare Providers: IncredibleEmployment.be  This test is not yet approved or cleared by the [place] FDA and has been authorized for detection and/or diagnosis of SARS-CoV-2 by FDA under an Emergency Use Authorization (EUA). This EUA will remain in effect (meaning this test can be used) for the duration of the COVID-19 declaration under Section 564(b)(1) of the Act, 21 U.S.C. section 360bbb-3(b)(1), unless the authorization is terminated or revoked.  Performed at [facility], [place] [address] [place], [place] [address]      Labs:  COVID-19 Labs  Recent Labs    10/17/20 2242  DDIMER 0.35    Lab Results  Component Value Date   [place] NEGATIVE 10/17/2020      Basic Metabolic Panel: Recent Labs  Lab 10/17/20 1432 10/17/20 2242 10/18/20 0544  NA 138  --  138  K 3.8  --  3.5  CL 104  --  106  CO2 24  --  24  GLUCOSE 229*  --  171*  BUN 16  --  16  CREATININE 1.33*  --  0.93  CALCIUM 9.7  --  9.2  MG  --  2.2 2.2   Liver Function Tests: Recent Labs  Lab 10/17/20 1432 10/18/20 0544  AST 19 18  ALT 16 13  ALKPHOS 61 56  BILITOT 0.9 0.5  PROT 7.8 6.8  ALBUMIN 3.7 3.2*   Recent Labs  Lab 10/17/20 1432  LIPASE 26    CBC: Recent Labs  Lab 10/17/20 1432 10/18/20 0544  WBC 6.1 6.5  NEUTROABS 3.0  --   HGB 13.8 12.7  HCT 42.5 37.9  MCV 94.9 91.8  PLT 276 239     CBG: Recent Labs  Lab  10/18/20 0619 10/18/20 1127  GLUCAP 155* 157*     IMAGING STUDIES DG Chest 2 View  Result Date: 10/17/2020 CLINICAL DATA:  Chest pain, shortness of breath EXAM: CHEST - 2 VIEW COMPARISON:  02/28/2009 FINDINGS: The heart size and mediastinal contours are within normal limits. No focal airspace consolidation, pleural effusion, or pneumothorax. The visualized skeletal structures are unremarkable. IMPRESSION: No active cardiopulmonary disease. Electronically Signed   By: Davina Poke D.O.   On: 10/17/2020 15:12   ECHOCARDIOGRAM COMPLETE  Result Date: 10/18/2020    ECHOCARDIOGRAM REPORT   Patient Name:   Kristi Campos Date of Exam: 10/18/2020 Medical Rec #:  [id number]      Height:       63.0 in Accession #:    [id number]     Weight:       192.5 lb Date of Birth:  1951-01-06     BSA:          1.902 m Patient Age:    35 years       BP:           141/84 mmHg Patient Gender: F              HR:  73 bpm. Exam Location:  Inpatient Procedure: 2D Echo, Cardiac Doppler and Color Doppler Indications:    Chest pain  History:        Patient has no prior history of Echocardiogram examinations.                 Risk Factors:Diabetes, Hypertension and Dyslipidemia.  Sonographer:    Clayton Lefort RDCS (AE) Referring Phys: [id number] [place]  1. Left ventricular ejection fraction, by estimation, is 55 to 60%. The left ventricle has normal function. The left ventricle has no regional wall motion abnormalities. There is mild left ventricular hypertrophy. Left ventricular diastolic parameters were normal.  2. Right ventricular systolic function is normal. The right ventricular size is normal. Tricuspid regurgitation signal is inadequate for assessing PA pressure.  3. The mitral valve is grossly normal. Mild mitral valve regurgitation.  4. The aortic valve is tricuspid. Aortic valve regurgitation is not visualized. No aortic stenosis is present. Aortic valve mean gradient measures 3.0 mmHg.  5. The  inferior vena cava is normal in size with greater than 50% respiratory variability, suggesting right atrial pressure of 3 mmHg. Comparison(s): No prior Echocardiogram. FINDINGS  Left Ventricle: Left ventricular ejection fraction, by estimation, is 55 to 60%. The left ventricle has normal function. The left ventricle has no regional wall motion abnormalities. The left ventricular internal cavity size was normal in size. There is  mild left ventricular hypertrophy. Left ventricular diastolic parameters were normal. Right Ventricle: The right ventricular size is normal. No increase in right ventricular wall thickness. Right ventricular systolic function is normal. Tricuspid regurgitation signal is inadequate for assessing PA pressure. Left Atrium: Left atrial size was normal in size. Right Atrium: Right atrial size was normal in size. Pericardium: There is no evidence of pericardial effusion. Presence of pericardial fat pad. Mitral Valve: The mitral valve is grossly normal. Mild mitral annular calcification. Mild mitral valve regurgitation. MV peak gradient, 5.0 mmHg. The mean mitral valve gradient is 2.0 mmHg. Tricuspid Valve: The tricuspid valve is grossly normal. Tricuspid valve regurgitation is trivial. Aortic Valve: The aortic valve is tricuspid. There is mild to moderate aortic valve annular calcification. Aortic valve regurgitation is not visualized. No aortic stenosis is present. Aortic valve mean gradient measures 3.0 mmHg. Aortic valve peak gradient measures 5.9 mmHg. Aortic valve area, by VTI measures 2.46 cm. Pulmonic Valve: The pulmonic valve was not well visualized. Pulmonic valve regurgitation is trivial. Aorta: The aortic root is normal in size and structure. Venous: The inferior vena cava is normal in size with greater than 50% respiratory variability, suggesting right atrial pressure of 3 mmHg. IAS/Shunts: No atrial level shunt detected by color flow Doppler.  LEFT VENTRICLE PLAX 2D LVIDd:          4.20 cm  Diastology LVIDs:         3.10 cm  LV e' medial:    7.83 cm/s LV PW:         1.00 cm  LV E/e' medial:  11.1 LV IVS:        1.10 cm  LV e' lateral:   10.20 cm/s LVOT diam:     1.90 cm  LV E/e' lateral: 8.5 LV SV:         67 LV SV Index:   35 LVOT Area:     2.84 cm  RIGHT VENTRICLE            IVC RV Basal diam:  2.90 cm    IVC  diam: 1.10 cm RV S prime:     9.14 cm/s TAPSE (M-mode): 2.1 cm LEFT ATRIUM             Index       RIGHT ATRIUM           Index LA diam:        2.70 cm 1.42 cm/m  RA Area:     13.10 cm LA Vol (A2C):   52.8 ml 27.75 ml/m RA Volume:   29.40 ml  15.45 ml/m LA Vol (A4C):   30.1 ml 15.82 ml/m LA Biplane Vol: 42.6 ml 22.39 ml/m  AORTIC VALVE AV Area (Vmax):    2.60 cm AV Area (Vmean):   2.34 cm AV Area (VTI):     2.46 cm AV Vmax:           121.00 cm/s AV Vmean:          82.800 cm/s AV VTI:            0.273 m AV Peak Grad:      5.9 mmHg AV Mean Grad:      3.0 mmHg LVOT Vmax:         111.00 cm/s LVOT Vmean:        68.300 cm/s LVOT VTI:          0.237 m LVOT/AV VTI ratio: 0.87  AORTA Ao Root diam: 2.80 cm Ao Asc diam:  2.80 cm MITRAL VALVE MV Area (PHT): 2.83 cm     SHUNTS MV Area VTI:   1.85 cm     Systemic VTI:  0.24 m MV Peak grad:  5.0 mmHg     Systemic Diam: 1.90 cm MV Mean grad:  2.0 mmHg MV Vmax:       1.12 m/s MV Vmean:      66.2 cm/s MV Decel Time: 268 msec MV E velocity: 86.80 cm/s MV A velocity: 108.00 cm/s MV E/A ratio:  0.80 Rozann Lesches MD Electronically signed by Rozann Lesches MD Signature Date/Time: 10/18/2020/11:39:44 AM    Final     DISCHARGE EXAMINATION: Vitals:   10/18/20 0306 10/18/20 0356 10/18/20 0734 10/18/20 1138  BP:  132/68 (!) 141/84 (!) 146/68  Pulse:  80 75 66  Resp:  18 16   Temp:  97.7 F (36.5 C) 98.1 F (36.7 C) 97.7 F (36.5 C)  TempSrc: Oral Oral Oral Oral  SpO2:  100% 99% 100%  Weight:  87.3 kg    Height:  '5\' 3"'$  (1.6 m)     General appearance: Awake alert.  In no distress Resp: Clear to auscultation bilaterally.  Normal  effort Cardio: S1-S2 is normal regular.  No S3-S4.  No rubs murmurs or bruit GI: Abdomen is soft.  Nontender nondistended.  Bowel sounds are present normal.  No masses organomegaly    DISPOSITION: Home  Discharge Instructions     Call MD for:  difficulty breathing, headache or visual disturbances   Complete by: As directed    Call MD for:  extreme fatigue   Complete by: As directed    Call MD for:  persistant dizziness or light-headedness   Complete by: As directed    Call MD for:  persistant nausea and vomiting   Complete by: As directed    Call MD for:  severe uncontrolled pain   Complete by: As directed    Call MD for:  temperature >100.4   Complete by: As directed    Diet - low sodium heart healthy  Complete by: As directed    Diet Carb Modified   Complete by: As directed    Discharge instructions   Complete by: As directed    You have an appointment with the cardiology office on 10/20/2020.  Please keep this appointment as they may need to do additional testing.  Until then take your medications as prescribed.  You were cared for by a hospitalist during your hospital stay. If you have any questions about your discharge medications or the care you received while you were in the hospital after you are discharged, you can call the unit and asked to speak with the hospitalist on call if the hospitalist that took care of you is not available. Once you are discharged, your primary care physician will handle any further medical issues. Please note that NO REFILLS for any discharge medications will be authorized once you are discharged, as it is imperative that you return to your primary care physician (or establish a relationship with a primary care physician if you do not have one) for your aftercare needs so that they can reassess your need for medications and monitor your lab values. If you do not have a primary care physician, you can call [phone] for a physician referral.    Increase activity slowly   Complete by: As directed          Allergies as of 10/18/2020   No Known Allergies      Medication List     TAKE these medications    aspirin EC 81 MG tablet Take 81 mg by mouth daily.   atorvastatin 10 MG tablet Commonly known as: LIPITOR Take 1 tablet (10 mg total) by mouth daily. What changed: when to take this   DRY EYES OP Place 1 drop into both eyes 4 (four) times daily.   glipiZIDE 2.5 MG 24 hr tablet Commonly known as: GLUCOTROL XL Take 2.5 mg by mouth every morning. What changed: Another medication with the same name was removed. Continue taking this medication, and follow the directions you see here.   losartan-hydrochlorothiazide 100-25 MG tablet Commonly known as: HYZAAR TAKE 1 TABLET BY MOUTH DAILY.   meloxicam 7.5 MG tablet Commonly known as: Mobic Take 1 tablet (7.5 mg total) by mouth daily. What changed: when to take this   nitroGLYCERIN 0.4 MG SL tablet Commonly known as: NITROSTAT Place 1 tablet (0.4 mg total) under the tongue every 5 (five) minutes as needed for chest pain.   pantoprazole 40 MG tablet Commonly known as: PROTONIX Take 1 tablet (40 mg total) by mouth daily at 12 noon.   tiZANidine 4 MG tablet Commonly known as: ZANAFLEX Take 4 mg by mouth 3 (three) times daily as needed.          Follow-up Information     Deberah Pelton, NP Follow up on 10/20/2020.   Specialty: Cardiology Why: Appt is on 10/20/2020 at 9:15AM. Contact information: [place] [address] [place], [facility] Follow up.   Specialty: Home Health Services Why: [facility] for PT/OT. THEY WILL CALL YOU TO SCHEDULE VISIT Contact information: [place] [address] [phone]         [facility] Follow up.   Why: CALL PCP OFFICE TO SCHEDULE FOLLOW UP Contact information: [address], [place],  [address] [phone]  TOTAL DISCHARGE TIME: 35 minutes  Anaid Haney [employer] on [email or web address]  10/19/2020, 1:47 PM

## 2020-10-18 NOTE — TOC Transition Note (Signed)
Transition of Care [facility]) - CM/SW Discharge Note   Patient Details  Name: Kristi Campos MRN: [id number] Date of Birth: 09/02/1950  [facility] CM/SW Contact:  Konrad Penta, RN Phone Number: [phone] 10/18/2020, 3:07 PM   Clinical Narrative:   Patient to transition home today. Lives alone. Son to pick her up from hospital today. Discussed home health recommendations for PT/OT. No preference. States she does not need DME. Has cane and rolling [place] at home.  States PCP is on [facility] in [place]. [facility].  Cory with [facility] called for home health referral.   No further needs assessed.    Final next level of care: [place] Barriers to Discharge: No Barriers Identified   Patient Goals and CMS Choice Patient states their goals for this hospitalization and ongoing recovery are:: return home CMS Medicare.gov Compare Post Acute Care list provided to:: Patient Choice offered to / list presented to : Patient  Discharge Placement               Discharge Plan and Services     HH Arranged: PT, OT [facility] Agency: [place] Date [place]: 10/18/20 Time [place]: [phone] Representative spoke with at [place]: [place] ([place]) Interventions     Readmission Risk Interventions No flowsheet data found.

## 2020-10-19 NOTE — Progress Notes (Signed)
Cardiology Clinic Note   Patient Name: Kristi Campos Date of Encounter: 10/20/2020  Primary Care Provider:  Pcp, No Primary Cardiologist:  Quay Burow, MD  Patient Profile    Kristi Campos presents to the clinic today for follow-up evaluation of her atypical chest pain.  Past Medical History    Past Medical History:  Diagnosis Date   Diabetes mellitus without complication ([place])    Fibroid uterus    Hypertension    Vaginal Pap smear, abnormal    Past Surgical History:  Procedure Laterality Date   I & D EXTREMITY Right 08/09/2013   Procedure: MINOR IRRIGATION AND DEBRIDEMENT EXTREMITY;  Surgeon: Cammie Sickle, MD;  Location: [facility];  Service: Orthopedics;  Laterality: Right;  long       wound class 4    Allergies  No Known Allergies  History of Present Illness     Kristi Campos has a PMH of type 2 diabetes, hyperlipidemia, HTN, atypical chest pain, and acute kidney injury.  Echocardiogram 10/18/2020 showed an LVEF of 55-60% and no significant valvular abnormalities.  Her ABIs were normal 11/12/2019.  She was recently admitted to the hospital at midnight on 10/18/2020 and discharged at 1650 on 10/18/2020.  She was noted to have chest discomfort on presentation.  She was noted to have mildly elevated creatinine and received IV fluids.  Her diabetes and HTN are stable.  It was felt that her chest discomfort was atypical and she was started on PPI.  Her EKG was normal and showed no ischemic changes.  Her D-dimer was normal.  Her echocardiogram showed normal LV function without wall motion abnormalities.  It was felt that her symptoms were related to acid reflux.  She presents the clinic today for follow-up evaluation states she feels well.  She has had no further episodes of chest discomfort since starting her omeprazole and being at the emergency department.  She reports that she is not very physically active.  We reviewed the importance of increased  physical activity and talked about GERD diet.  She expressed understanding.  I will give her a GERD diet instructions, and have her increase her physical activity as tolerated and follow-up with Dr. Gwenlyn Found in 6 months.  Today she denies chest pain, shortness of breath, lower extremity edema, fatigue, palpitations, melena, hematuria, hemoptysis, diaphoresis, weakness, presyncope, syncope, orthopnea, and PND.   Home Medications    Prior to Admission medications   Medication Sig Start Date End Date Taking? Authorizing Provider  Artificial Tear Ointment (DRY EYES OP) Place 1 drop into both eyes 4 (four) times daily.    [provider]  aspirin EC 81 MG tablet Take 81 mg by mouth daily.    [provider]  atorvastatin (LIPITOR) 10 MG tablet Take 1 tablet (10 mg total) by mouth daily. Patient taking differently: Take 10 mg by mouth at bedtime. 12/06/19   Argentina Donovan, PA-C  glipiZIDE (GLUCOTROL XL) 2.5 MG 24 hr tablet Take 2.5 mg by mouth every morning. 08/19/20   [provider]  losartan-hydrochlorothiazide (HYZAAR) 100-25 MG tablet TAKE 1 TABLET BY MOUTH DAILY. 12/06/19 12/05/20  Argentina Donovan, PA-C  meloxicam (MOBIC) 7.5 MG tablet Take 1 tablet (7.5 mg total) by mouth daily. Patient taking differently: Take 7.5 mg by mouth at bedtime. 03/12/20   Argentina Donovan, PA-C  nitroGLYCERIN (NITROSTAT) 0.4 MG SL tablet Place 1 tablet (0.4 mg total) under the tongue every 5 (five) minutes as needed for chest  pain. 10/18/20   Bonnielee Haff, MD  pantoprazole (PROTONIX) 40 MG tablet Take 1 tablet (40 mg total) by mouth daily at 12 noon. 10/19/20 11/18/20  Bonnielee Haff, MD  tiZANidine (ZANAFLEX) 4 MG tablet Take 4 mg by mouth 3 (three) times daily as needed. 09/26/20   [provider]  simvastatin (ZOCOR) 10 MG tablet Take 10 mg by mouth at bedtime. Patient not taking: Reported on 12/06/2019 01/01/19 02/28/20  [provider]    Family History    Family  History  Problem Relation Age of Onset   Bipolar disorder Mother    Prostate cancer Other    She indicated that her mother is deceased. She indicated that her father is deceased. She indicated that her other is deceased.  Social History    Social History   Socioeconomic History   Marital status: Widowed    Spouse name: Not on file   Number of children: 4   Years of education: Not on file   Highest education level: Not on file  Occupational History   Not on file  Tobacco Use   Smoking status: Never   Smokeless tobacco: Never  Vaping Use   Vaping Use: Never used  Substance and Sexual Activity   Alcohol use: No    Alcohol/week: 0.0 standard drinks   Drug use: No   Sexual activity: Not Currently    Partners: Male    Birth control/protection: Post-menopausal  Other Topics Concern   Not on file  Social History Narrative   Right handed    Lives at home alone    Social Determinants of Health   Financial Resource Strain: Not on file  Food Insecurity: Not on file  Transportation Needs: Not on file  Physical Activity: Not on file  Stress: Not on file  Social Connections: Not on file  Intimate Partner Violence: Not on file     Review of Systems    General:  No chills, fever, night sweats or weight changes.  Cardiovascular:  No chest pain, dyspnea on exertion, edema, orthopnea, palpitations, paroxysmal nocturnal dyspnea. Dermatological: No rash, lesions/masses Respiratory: No cough, dyspnea Urologic: No hematuria, dysuria Abdominal:   No nausea, vomiting, diarrhea, bright red blood per rectum, melena, or hematemesis Neurologic:  No visual changes, wkns, changes in mental status. All other systems reviewed and are otherwise negative except as noted above.  Physical Exam    VS:  BP 126/76   Pulse 89   Resp 20   Ht '5\' 3"'$  (1.6 m)   Wt 193 lb 3.2 oz (87.6 kg)   SpO2 97%   BMI 34.22 kg/m  , BMI Body mass index is 34.22 kg/m. GEN: Well nourished, well developed, in  no acute distress. HEENT: normal. Neck: Supple, no JVD, carotid bruits, or masses. Cardiac: RRR, no murmurs, rubs, or gallops. No clubbing, cyanosis, edema.  Radials/DP/PT 2+ and equal bilaterally.  Respiratory:  Respirations regular and unlabored, clear to auscultation bilaterally. GI: Soft, nontender, nondistended, BS + x 4. MS: no deformity or atrophy. Skin: warm and dry, no rash. Neuro:  Strength and sensation are intact. Psych: Normal affect.  Accessory Clinical Findings    Recent Labs: 12/06/2019: TSH 1.010 10/18/2020: ALT 13; BUN 16; Creatinine, Ser 0.93; Hemoglobin 12.7; Magnesium 2.2; Platelets 239; Potassium 3.5; Sodium 138   Recent Lipid Panel    Component Value Date/Time   CHOL 177 12/06/2019 1035   TRIG 107 12/06/2019 1035   HDL 53 12/06/2019 1035   CHOLHDL 3.3 12/06/2019 1035  LDLCALC 105 (H) 12/06/2019 1035    ECG personally reviewed by me today-none today.  ABIs 11/12/2019 Summary:  Right: Resting right ankle-brachial index is within normal range. No  evidence of significant right lower extremity arterial disease. The right  toe-brachial index is normal.   Left: Resting left ankle-brachial index is within normal range. No  evidence of significant left lower extremity arterial disease. The left  toe-brachial index is normal.  Echocardiogram 10/18/2020 IMPRESSIONS     1. Left ventricular ejection fraction, by estimation, is 55 to 60%. The  left ventricle has normal function. The left ventricle has no regional  wall motion abnormalities. There is mild left ventricular hypertrophy.  Left ventricular diastolic parameters  were normal.   2. Right ventricular systolic function is normal. The right ventricular  size is normal. Tricuspid regurgitation signal is inadequate for assessing  PA pressure.   3. The mitral valve is grossly normal. Mild mitral valve regurgitation.   4. The aortic valve is tricuspid. Aortic valve regurgitation is not  visualized. No  aortic stenosis is present. Aortic valve mean gradient  measures 3.0 mmHg.   5. The inferior vena cava is normal in size with greater than 50%  respiratory variability, suggesting right atrial pressure of 3 mmHg.   Comparison(s): No prior Echocardiogram.   Assessment & Plan   1.  Essential hypertension-BP today 126/76.  Well-controlled at home. Continue losartan, HCTZ Heart healthy low-sodium diet-salty 6 given Increase physical activity as tolerated  Chest discomfort/GERD-no further episodes of chest arm neck or back discomfort.  Seen and evaluated in the emergency department 10/18/2020.  Work-up unremarkable.  Felt to be related to reflux. Continue pantoprazole Heart healthy low-sodium diet-salty 6 given Increase physical activity as tolerated  Hyperlipidemia-LDL 86 on 07/11/2019. Continue atorvastatin, aspirin Heart healthy low-sodium diet-salty 6 given Increase physical activity as tolerated  Disposition: Follow-up with Dr. Gwenlyn Found in 6 months.  Jossie Ng. Jennings Stirling NP-C    10/20/2020, 9:55 AM [address] Office [phone] Fax [phone]  Notice: This dictation was prepared with Dragon dictation along with smaller phrase technology. Any transcriptional errors that result from this process are unintentional and may not be corrected upon review.  I spent 13 minutes examining this patient, reviewing medications, and using patient centered shared decision making involving her cardiac care.  Prior to her visit I spent greater than 20 minutes reviewing her past medical history,  medications, and prior cardiac tests.

## 2020-10-20 ENCOUNTER — Encounter: Payer: Self-pay | Admitting: General Practice

## 2020-10-20 ENCOUNTER — Ambulatory Visit (INDEPENDENT_AMBULATORY_CARE_PROVIDER_SITE_OTHER): Payer: Medicare HMO | Admitting: General Practice

## 2020-10-20 ENCOUNTER — Other Ambulatory Visit: Payer: Self-pay

## 2020-10-20 VITALS — BP 126/76 | HR 89 | Resp 20 | Ht 63.0 in | Wt 193.2 lb

## 2020-10-20 DIAGNOSIS — K2100 Gastro-esophageal reflux disease with esophagitis, without bleeding: Secondary | ICD-10-CM

## 2020-10-20 DIAGNOSIS — I10 Essential (primary) hypertension: Secondary | ICD-10-CM

## 2020-10-20 DIAGNOSIS — E782 Mixed hyperlipidemia: Secondary | ICD-10-CM

## 2020-10-20 NOTE — Patient Instructions (Signed)
Medication Instructions:  The current medical regimen is effective;  continue present plan and medications as directed. Please refer to the Current Medication list given to you today.   *If you need a refill on your cardiac medications before your next appointment, please call your pharmacy*  Lab Work:   Testing/Procedures:  NONE    NONE  Special Instructions PLEASE READ AND FOLLOW GERD DIET-ATTACHED  PLEASE INCREASE PHYSICAL ACTIVITY AS TOLERATED,   Follow-Up: Your next appointment:  6 month(s) In Person with Kristi Burow, MD   At [facility], you and your health needs are our priority.  As part of our continuing mission to provide you with exceptional heart care, we have created designated Provider Care Teams.  These Care Teams include your primary Cardiologist (physician) and Advanced Practice Providers (APPs -  Physician Assistants and Nurse Practitioners) who all work together to provide you with the care you need, when you need it.  We recommend signing up for the patient portal called "MyChart".  Sign up information is provided on this After Visit Summary.  MyChart is used to connect with patients for Virtual Visits (Telemedicine).  Patients are able to view lab/test results, encounter notes, upcoming appointments, etc.  Non-urgent messages can be sent to your provider as well.   To learn more about what you can do with MyChart, go to [email or web address].           Food Choices for Gastroesophageal Reflux Disease, Adult When you have gastroesophageal reflux disease (GERD), the foods you eat and your eating habits are very important. Choosing the right foods can help ease your discomfort. Think about working with a food expert (dietitian) to help you make good choices. What are tips for following this plan? Reading food labels Look for foods that are low in saturated fat. Foods that may help with your symptoms include: Foods that have less than 5% of daily value (DV) of  fat. Foods that have 0 grams of trans fat. Cooking Do not fry your food. Cook your food by baking, steaming, grilling, or broiling. These are all methods that do not need a lot of fat for cooking. To add flavor, try to use herbs that are low in spice and acidity. Meal planning  Choose healthy foods that are low in fat, such as: Fruits and vegetables. Whole grains. Low-fat dairy products. Lean meats, fish, and poultry. Eat small meals often instead of eating 3 large meals each day. Eat your meals slowly in a place where you are relaxed. Avoid bending over or lying down until 2-3 hours after eating. Limit high-fat foods such as fatty meats or fried foods. Limit your intake of fatty foods, such as oils, butter, and shortening. Avoid the following as told by your doctor: Foods that cause symptoms. These may be different for different people. Keep a food diary to keep track of foods that cause symptoms. Alcohol. Drinking a lot of liquid with meals. Eating meals during the 2-3 hours before bed.  Lifestyle Stay at a healthy weight. Ask your doctor what weight is healthy for you. If you need to lose weight, work with your doctor to do so safely. Exercise for at least 30 minutes on 5 or more days each week, or as told by your doctor. Wear loose-fitting clothes. Do not smoke or use any products that contain nicotine or tobacco. If you need help quitting, ask your doctor. Sleep with the head of your bed higher than your feet. Use a wedge under the  mattress or blocks under the bed frame to raise the head of the bed. Chew sugar-free gum after meals. What foods should eat?  Eat a healthy, well-balanced diet of fruits, vegetables, whole grains, low-fatdairy products, lean meats, fish, and poultry. Each person is different. Foods that may cause symptoms in one person may not cause any symptoms inanother person. Work with your doctor to find foods that are safe for you. The items listed above may not  be a complete list of what you can eat and drink. Contact a food expert for more options. What foods should I avoid? Limiting some of these foods may help in managing the symptoms of GERD. Everyone is different. Talk with a food expert or your doctor to help you findthe exact foods to avoid, if any. Fruits Any fruits prepared with added fat. Any fruits that cause symptoms. For some people, this may include citrus fruits, such as oranges, grapefruit, pineapple,and lemons. Vegetables Deep-fried vegetables. [place] fries. Any vegetables prepared with added fat. Any vegetables that cause symptoms. For some people, this may include tomatoesand tomato products, chili peppers, onions and garlic, and horseradish. Grains Pastries or quick breads with added fat. Meats and other proteins High-fat meats, such as fatty beef or pork, hot dogs, ribs, ham, sausage, salami, and bacon. Fried meat or protein, including fried fish and friedchicken. Nuts and nut butters, in large amounts. Dairy Whole milk and chocolate milk. Sour cream. Cream. Ice cream. Cream cheese.Milkshakes. Fats and oils Butter. Margarine. Shortening. Ghee. Beverages Coffee and tea, with or without caffeine. Carbonated beverages. Sodas. Energy drinks. Fruit juice made with acidic fruits, such as orange or grapefruit.Tomato juice. Alcoholic drinks. Sweets and desserts Chocolate and cocoa. Donuts. Seasonings and condiments Pepper. Peppermint and spearmint. Added salt. Any condiments, herbs, or seasonings that cause symptoms. For some people, this may include curry, hotsauce, or vinegar-based salad dressings. The items listed above may not be a complete list of what you should not eat and drink. Contact a food expert for more options. Questions to ask your doctor Diet and lifestyle changes are often the first steps that are taken to manage symptoms of GERD. If diet and lifestyle changes do not help, talk with yourdoctor about taking  medicines. Where to find more information International Foundation for Gastrointestinal Disorders: aboutgerd.org Summary When you have GERD, food and lifestyle choices are very important in easing your symptoms. Eat small meals often instead of 3 large meals a day. Eat your meals slowly and in a place where you are relaxed. Avoid bending over or lying down until 2-3 hours after eating. Limit high-fat foods such as fatty meats or fried foods. This information is not intended to replace advice given to you by your health care provider. Make sure you discuss any questions you have with your healthcare provider. Document Revised: 08/27/2019 Document Reviewed: 08/27/2019 Elsevier Patient Education  [place].

## 2020-10-21 ENCOUNTER — Telehealth: Payer: Self-pay | Admitting: Cardiovascular Disease

## 2020-10-21 DIAGNOSIS — Z6834 Body mass index (BMI) 34.0-34.9, adult: Secondary | ICD-10-CM | POA: Diagnosis not present

## 2020-10-21 DIAGNOSIS — E785 Hyperlipidemia, unspecified: Secondary | ICD-10-CM | POA: Diagnosis not present

## 2020-10-21 DIAGNOSIS — E119 Type 2 diabetes mellitus without complications: Secondary | ICD-10-CM | POA: Diagnosis not present

## 2020-10-21 DIAGNOSIS — G8929 Other chronic pain: Secondary | ICD-10-CM | POA: Diagnosis not present

## 2020-10-21 DIAGNOSIS — E669 Obesity, unspecified: Secondary | ICD-10-CM | POA: Diagnosis not present

## 2020-10-21 DIAGNOSIS — M545 Low back pain, unspecified: Secondary | ICD-10-CM | POA: Diagnosis not present

## 2020-10-21 DIAGNOSIS — I119 Hypertensive heart disease without heart failure: Secondary | ICD-10-CM | POA: Diagnosis not present

## 2020-10-21 DIAGNOSIS — I088 Other rheumatic multiple valve diseases: Secondary | ICD-10-CM | POA: Diagnosis not present

## 2020-10-21 DIAGNOSIS — D259 Leiomyoma of uterus, unspecified: Secondary | ICD-10-CM | POA: Diagnosis not present

## 2020-10-21 NOTE — Telephone Encounter (Signed)
Call from [facility] onsite with patient and during assessment patient BP 160/120. Patient stated she has not taken her BP medication today and is completely asymptomatic.  Home Health stated she just took her medications in last 5 mintues and will be with patient for about another 45 mintues and will reassess before he leaves. No additional questions at this time.

## 2020-10-22 DIAGNOSIS — D259 Leiomyoma of uterus, unspecified: Secondary | ICD-10-CM | POA: Diagnosis not present

## 2020-10-22 DIAGNOSIS — E669 Obesity, unspecified: Secondary | ICD-10-CM | POA: Diagnosis not present

## 2020-10-22 DIAGNOSIS — I119 Hypertensive heart disease without heart failure: Secondary | ICD-10-CM | POA: Diagnosis not present

## 2020-10-22 DIAGNOSIS — E119 Type 2 diabetes mellitus without complications: Secondary | ICD-10-CM | POA: Diagnosis not present

## 2020-10-22 DIAGNOSIS — M545 Low back pain, unspecified: Secondary | ICD-10-CM | POA: Diagnosis not present

## 2020-10-22 DIAGNOSIS — E785 Hyperlipidemia, unspecified: Secondary | ICD-10-CM | POA: Diagnosis not present

## 2020-10-22 DIAGNOSIS — G8929 Other chronic pain: Secondary | ICD-10-CM | POA: Diagnosis not present

## 2020-10-22 DIAGNOSIS — I088 Other rheumatic multiple valve diseases: Secondary | ICD-10-CM | POA: Diagnosis not present

## 2020-10-22 DIAGNOSIS — Z6834 Body mass index (BMI) 34.0-34.9, adult: Secondary | ICD-10-CM | POA: Diagnosis not present

## 2020-10-23 DIAGNOSIS — Z6834 Body mass index (BMI) 34.0-34.9, adult: Secondary | ICD-10-CM | POA: Diagnosis not present

## 2020-10-23 DIAGNOSIS — D259 Leiomyoma of uterus, unspecified: Secondary | ICD-10-CM | POA: Diagnosis not present

## 2020-10-23 DIAGNOSIS — E669 Obesity, unspecified: Secondary | ICD-10-CM | POA: Diagnosis not present

## 2020-10-23 DIAGNOSIS — I119 Hypertensive heart disease without heart failure: Secondary | ICD-10-CM | POA: Diagnosis not present

## 2020-10-23 DIAGNOSIS — G8929 Other chronic pain: Secondary | ICD-10-CM | POA: Diagnosis not present

## 2020-10-23 DIAGNOSIS — M545 Low back pain, unspecified: Secondary | ICD-10-CM | POA: Diagnosis not present

## 2020-10-23 DIAGNOSIS — E119 Type 2 diabetes mellitus without complications: Secondary | ICD-10-CM | POA: Diagnosis not present

## 2020-10-23 DIAGNOSIS — E785 Hyperlipidemia, unspecified: Secondary | ICD-10-CM | POA: Diagnosis not present

## 2020-10-23 DIAGNOSIS — I088 Other rheumatic multiple valve diseases: Secondary | ICD-10-CM | POA: Diagnosis not present

## 2020-10-24 ENCOUNTER — Telehealth: Payer: Self-pay

## 2020-10-24 ENCOUNTER — Telehealth: Payer: Self-pay | Admitting: Cardiovascular Disease

## 2020-10-24 DIAGNOSIS — M545 Low back pain, unspecified: Secondary | ICD-10-CM | POA: Diagnosis not present

## 2020-10-24 DIAGNOSIS — Z6834 Body mass index (BMI) 34.0-34.9, adult: Secondary | ICD-10-CM | POA: Diagnosis not present

## 2020-10-24 DIAGNOSIS — I119 Hypertensive heart disease without heart failure: Secondary | ICD-10-CM | POA: Diagnosis not present

## 2020-10-24 DIAGNOSIS — I088 Other rheumatic multiple valve diseases: Secondary | ICD-10-CM | POA: Diagnosis not present

## 2020-10-24 DIAGNOSIS — G8929 Other chronic pain: Secondary | ICD-10-CM | POA: Diagnosis not present

## 2020-10-24 DIAGNOSIS — E119 Type 2 diabetes mellitus without complications: Secondary | ICD-10-CM | POA: Diagnosis not present

## 2020-10-24 DIAGNOSIS — E669 Obesity, unspecified: Secondary | ICD-10-CM | POA: Diagnosis not present

## 2020-10-24 DIAGNOSIS — D259 Leiomyoma of uterus, unspecified: Secondary | ICD-10-CM | POA: Diagnosis not present

## 2020-10-24 DIAGNOSIS — E785 Hyperlipidemia, unspecified: Secondary | ICD-10-CM | POA: Diagnosis not present

## 2020-10-24 NOTE — Telephone Encounter (Signed)
Left a message for Clair Gulling physical therapist with [facility] to call back.

## 2020-10-24 NOTE — Telephone Encounter (Signed)
   Jozelyn Schank DOB: 11-09-1950 MRN: [id number]   RIDER WAIVER AND RELEASE OF LIABILITY  For purposes of improving physical access to our facilities, [facility] is pleased to partner with third parties to provide [facility] patients or other authorized individuals the option of convenient, on-demand ground transportation services (the [place]") through use of the technology service that enables users to request on-demand ground transportation from independent third-party providers.  By opting to use and accept these [employer], I, the undersigned, hereby agree on behalf of myself, and on behalf of any minor child using the Government social research officer for whom I am the parent or legal guardian, as follows:  Government social research officer provided to me are provided by independent third-party transportation providers who are not [employer] or employees and who are unaffiliated with [employer]. [place] is neither a transportation carrier nor a common or public carrier. [facility] has no control over the quality or safety of the transportation that occurs as a result of the [employer]. [facility] cannot guarantee that any third-party transportation provider will complete any arranged transportation service. [facility] makes no representation, warranty, or guarantee regarding the reliability, timeliness, quality, safety, suitability, or availability of any of the Transport Services or that they will be error free. I fully understand that traveling by vehicle involves risks and dangers of serious bodily injury, including permanent disability, paralysis, and death. I agree, on behalf of myself and on behalf of any minor child using the Transport Services for whom I am the parent or legal guardian, that the entire risk arising out of my use of the [employer] remains solely with me, to the maximum extent permitted under applicable law. The [employer] are provided "as  is" and "as available." [facility] disclaims all representations and warranties, express, implied or statutory, not expressly set out in these terms, including the implied warranties of merchantability and fitness for a particular purpose. I hereby waive and release [facility], its agents, employees, officers, directors, representatives, insurers, attorneys, assigns, successors, subsidiaries, and affiliates from any and all past, present, or future claims, demands, liabilities, actions, causes of action, or suits of any kind directly or indirectly arising from acceptance and use of the [employer]. I further waive and release [facility] [facility] and its affiliates from all present and future liability and responsibility for any injury or death to persons or damages to property caused by or related to the use of the [employer]. I have read this Waiver and Release of Liability, and I understand the terms used in it and their legal significance. This Waiver is freely and voluntarily given with the understanding that my right (as well as the right of any minor child for whom I am the parent or legal guardian using the [employer]) to legal recourse against [facility] in connection with the [employer] is knowingly surrendered in return for use of these services.   I attest that I read the consent document to [name], gave Ms. Kelly the opportunity to ask questions and answered the questions asked (if any). I affirm that [name] then provided consent for she's participation in this program.     Legrand Pitts

## 2020-10-24 NOTE — Telephone Encounter (Signed)
Pt c/o BP issue: STAT if pt c/o blurred vision, one-sided weakness or slurred speech  1. What are your last 5 BP readings?    10/24/20:  Patient states she took her BP medications this morning  Seated: 160/100 Standing: 140/90   Clair Gulling states he also saw the patient 10/21/20 and called the office to report her BP on this day too  2. Are you having any other symptoms (ex. Dizziness, headache, blurred vision, passed out)? Intermittent dizziness, occurs with standing and rolling in bed   3. What is your BP issue? Pt is asymptomatic but Clair Gulling is calling to report the BP    Please call the patient's Daughter or the Patient's cell phone.   The patient is having some plumbing work done at her house and has no access to a toilet at her house. Home Health is trying to convince her to go to her daughters house

## 2020-10-25 NOTE — Progress Notes (Signed)
Cardiology Office Note   Date:  10/27/2020   ID:  Teague Posten, DOB 1950-09-19, MRN [id number]  PCP:  Pcp, No  Cardiologist:  Quay Burow, MD EP: None  Chief Complaint  Patient presents with   Hypertension       History of Present Illness: Kristi Campos is a 70 y.o. female with a PMH of atypical chest pain, HTN, HLD, DM type 2, who presents for recently elevated blood pressures.   She was recently admitted to the hospital overnight 10/18/20 after presenting with chest pain. She had EKG which was non-ischemic and HsTrop and Ddimer which were wnl. Cr was mildly elevated and she received IVFs. Echocardiogram showed EF 55-60%, mild LVH, no RWMA, normal LV diastolic function, and mild MR. She previously underwent a calcium score 09/2019 which showed calcium score of 0. She was discharged home with close follow-up.   She saw Coletta Memos, NP 10/20/20 and was reported to be doing well without further chest discomfort since starting omeprazole. BP was well controlled at 126/76 that admission. No medication changes occurred and no further cardiac work-up was recommended at that time. She was recommended to follow-up in 6 months with Dr. Gwenlyn Found. On 10/21/20 her Home Health PT called to report elevated blood pressures of 160/120 though patient had not taken her BP medications yet. He called 10/22/20 to report persistently elevated BP despite taking medications which were elevated to 160/100 sitting and 140/90 standing. She was scheduled for this office visit for further management of her elevated blood pressures.  She presents today for follow-up of recently elevated blood pressures. She reports SBP has been up to 140s at home over the weekend, though she also describes symptoms c/f orthostatic hypotension. She has not had any headache, vision changes, or neurologic changes with elevated BP's at home. She has thankfully not had any falls or syncopal episodes with her lightheaded spells. She  notices the lightheadedness with quick position changes. Orthostatic precautions advised. We discussed letting her blood pressures run a little high at home to allow higher baseline should BP drop at home. BP today is quite well controlled. She has no complaints of chest pain, SOB, DOE, orthopnea, PND, LE edema, or palpitations. She plans to reach out to her PCP for a bedside commode.     Past Medical History:  Diagnosis Date   Diabetes mellitus without complication ([place])    Fibroid uterus    Hypertension    Vaginal Pap smear, abnormal     Past Surgical History:  Procedure Laterality Date   I & D EXTREMITY Right 08/09/2013   Procedure: MINOR IRRIGATION AND DEBRIDEMENT EXTREMITY;  Surgeon: Cammie Sickle, MD;  Location: [facility];  Service: Orthopedics;  Laterality: Right;  long       wound class 4     Current Outpatient Medications  Medication Sig Dispense Refill   Artificial Tear Ointment (DRY EYES OP) Place 1 drop into both eyes 4 (four) times daily.     aspirin EC 81 MG tablet Take 81 mg by mouth daily.     atorvastatin (LIPITOR) 10 MG tablet Take 1 tablet (10 mg total) by mouth daily. (Patient taking differently: Take 10 mg by mouth at bedtime.) 90 tablet 1   glipiZIDE (GLUCOTROL XL) 2.5 MG 24 hr tablet Take 2.5 mg by mouth every morning.     losartan-hydrochlorothiazide (HYZAAR) 100-25 MG tablet TAKE 1 TABLET BY MOUTH DAILY. 90 tablet 3   meloxicam (MOBIC) 7.5 MG tablet  Take 1 tablet (7.5 mg total) by mouth daily. (Patient taking differently: Take 7.5 mg by mouth at bedtime.) 30 tablet 1   nitroGLYCERIN (NITROSTAT) 0.4 MG SL tablet Place 1 tablet (0.4 mg total) under the tongue every 5 (five) minutes as needed for chest pain. 20 tablet 0   pantoprazole (PROTONIX) 40 MG tablet Take 1 tablet (40 mg total) by mouth daily at 12 noon. 30 tablet 0   tiZANidine (ZANAFLEX) 4 MG tablet Take 4 mg by mouth 3 (three) times daily as needed.     No current  facility-administered medications for this visit.    Allergies:   Patient has no known allergies.    Social History:  The patient  reports that she has never smoked. She has never used smokeless tobacco. She reports that she does not drink alcohol and does not use drugs.   Family History:  The patient's  family history includes Bipolar disorder in her mother; Prostate cancer in an other family member.    ROS:  Please see the history of present illness.   Otherwise, review of systems are positive for none.   All other systems are reviewed and negative.    PHYSICAL EXAM: VS:  BP 108/76 (BP Location: Left Arm)   Pulse 99   Ht '5\' 3"'$  (1.6 m)   Wt 190 lb 12.8 oz (86.5 kg)   SpO2 99%   BMI 33.80 kg/m  , BMI Body mass index is 33.8 kg/m. GEN: Well nourished, well developed, in no acute distress HEENT: sclera anicteric Neck: no JVD, carotid bruits, or masses Cardiac: RRR; no murmurs, rubs, or gallops, no edema  Respiratory:  clear to auscultation bilaterally, normal work of breathing GI: soft, obese, nontender, nondistended, + BS MS: no deformity or atrophy Skin: warm and dry, no rash Neuro:  Strength and sensation are intact Psych: euthymic mood, full affect   EKG:  EKG is not ordered today.   Recent Labs: 12/06/2019: TSH 1.010 10/18/2020: ALT 13; BUN 16; Creatinine, Ser 0.93; Hemoglobin 12.7; Magnesium 2.2; Platelets 239; Potassium 3.5; Sodium 138    Lipid Panel    Component Value Date/Time   CHOL 177 12/06/2019 1035   TRIG 107 12/06/2019 1035   HDL 53 12/06/2019 1035   CHOLHDL 3.3 12/06/2019 1035   LDLCALC 105 (H) 12/06/2019 1035      Wt Readings from Last 3 Encounters:  10/27/20 190 lb 12.8 oz (86.5 kg)  10/20/20 193 lb 3.2 oz (87.6 kg)  10/18/20 192 lb 8 oz (87.3 kg)      Other studies Reviewed: Additional studies/ records that were reviewed today include:   Echocardiogram 10/18/20: 1. Left ventricular ejection fraction, by estimation, is 55 to 60%. The   left ventricle has normal function. The left ventricle has no regional  wall motion abnormalities. There is mild left ventricular hypertrophy.  Left ventricular diastolic parameters  were normal.   2. Right ventricular systolic function is normal. The right ventricular  size is normal. Tricuspid regurgitation signal is inadequate for assessing  PA pressure.   3. The mitral valve is grossly normal. Mild mitral valve regurgitation.   4. The aortic valve is tricuspid. Aortic valve regurgitation is not  visualized. No aortic stenosis is present. Aortic valve mean gradient  measures 3.0 mmHg.   5. The inferior vena cava is normal in size with greater than 50%  respiratory variability, suggesting right atrial pressure of 3 mmHg.   Comparison(s): No prior Echocardiogram.     ASSESSMENT AND  PLAN:  1. HTN: BP 108/76 today. Home readings have been in the 140s/90s. She also describes some orthostatic symptoms so I am hesitant to make changes today which could worsen orthostatic hypotension.  - Continue losartan-HCTZ - She will continue to monitor BP at home.   2. HLD: LDL 105 11/2019 - Continue atorvastatin  3. Atypical chest pain: no recurrent symptoms - Continue pantoprazole for GERD - Continue to monitor for recurrence  4. DM type 2: A1C 7.6 11/2019  - Continue glipizide per PCP   Current medicines are reviewed at length with the patient today.  The patient does not have concerns regarding medicines.  The following changes have been made:  no change  Labs/ tests ordered today include:  No orders of the defined types were placed in this encounter.    Disposition:   FU with Dr. Gwenlyn Found in 6 months as scheduled.   Signed, Abigail Butts, PA-C  10/27/2020 9:59 AM

## 2020-10-27 ENCOUNTER — Encounter: Payer: Self-pay | Admitting: Medical

## 2020-10-27 ENCOUNTER — Other Ambulatory Visit: Payer: Self-pay

## 2020-10-27 ENCOUNTER — Ambulatory Visit: Payer: Medicare HMO | Admitting: Medical

## 2020-10-27 VITALS — BP 108/76 | HR 99 | Ht 63.0 in | Wt 190.8 lb

## 2020-10-27 DIAGNOSIS — E785 Hyperlipidemia, unspecified: Secondary | ICD-10-CM

## 2020-10-27 DIAGNOSIS — R0789 Other chest pain: Secondary | ICD-10-CM

## 2020-10-27 DIAGNOSIS — I10 Essential (primary) hypertension: Secondary | ICD-10-CM

## 2020-10-27 DIAGNOSIS — E119 Type 2 diabetes mellitus without complications: Secondary | ICD-10-CM

## 2020-10-27 NOTE — Patient Instructions (Signed)
Medication Instructions:  No Changes *If you need a refill on your cardiac medications before your next appointment, please call your pharmacy*   Lab Work: No Labs If you have labs (blood work) drawn today and your tests are completely normal, you will receive your results only by: [place] (if you have MyChart) OR A paper copy in the mail If you have any lab test that is abnormal or we need to change your treatment, we will call you to review the results.   Testing/Procedures: No Testing   Follow-Up: At [facility], you and your health needs are our priority.  As part of our continuing mission to provide you with exceptional heart care, we have created designated Provider Care Teams.  These Care Teams include your primary Cardiologist (physician) and Advanced Practice Providers (APPs -  Physician Assistants and Nurse Practitioners) who all work together to provide you with the care you need, when you need it.  We recommend signing up for the patient portal called "MyChart".  Sign up information is provided on this After Visit Summary.  MyChart is used to connect with patients for Virtual Visits (Telemedicine).  Patients are able to view lab/test results, encounter notes, upcoming appointments, etc.  Non-urgent messages can be sent to your provider as well.   To learn more about what you can do with MyChart, go to [email or web address].    Your next appointment:   April 22, 2021 10 : AM  The format for your next appointment:   In Person  Provider:   Quay Burow, MD

## 2020-10-28 DIAGNOSIS — D259 Leiomyoma of uterus, unspecified: Secondary | ICD-10-CM | POA: Diagnosis not present

## 2020-10-28 DIAGNOSIS — G8929 Other chronic pain: Secondary | ICD-10-CM | POA: Diagnosis not present

## 2020-10-28 DIAGNOSIS — I119 Hypertensive heart disease without heart failure: Secondary | ICD-10-CM | POA: Diagnosis not present

## 2020-10-28 DIAGNOSIS — E785 Hyperlipidemia, unspecified: Secondary | ICD-10-CM | POA: Diagnosis not present

## 2020-10-28 DIAGNOSIS — Z6834 Body mass index (BMI) 34.0-34.9, adult: Secondary | ICD-10-CM | POA: Diagnosis not present

## 2020-10-28 DIAGNOSIS — E119 Type 2 diabetes mellitus without complications: Secondary | ICD-10-CM | POA: Diagnosis not present

## 2020-10-28 DIAGNOSIS — M545 Low back pain, unspecified: Secondary | ICD-10-CM | POA: Diagnosis not present

## 2020-10-28 DIAGNOSIS — E669 Obesity, unspecified: Secondary | ICD-10-CM | POA: Diagnosis not present

## 2020-10-28 DIAGNOSIS — I088 Other rheumatic multiple valve diseases: Secondary | ICD-10-CM | POA: Diagnosis not present

## 2020-10-29 DIAGNOSIS — M47819 Spondylosis without myelopathy or radiculopathy, site unspecified: Secondary | ICD-10-CM | POA: Diagnosis not present

## 2020-10-29 DIAGNOSIS — K219 Gastro-esophageal reflux disease without esophagitis: Secondary | ICD-10-CM | POA: Diagnosis not present

## 2020-10-29 DIAGNOSIS — Z7189 Other specified counseling: Secondary | ICD-10-CM | POA: Diagnosis not present

## 2020-10-29 DIAGNOSIS — I110 Hypertensive heart disease with heart failure: Secondary | ICD-10-CM | POA: Diagnosis not present

## 2020-10-29 DIAGNOSIS — Z79899 Other long term (current) drug therapy: Secondary | ICD-10-CM | POA: Diagnosis not present

## 2020-10-29 DIAGNOSIS — E669 Obesity, unspecified: Secondary | ICD-10-CM | POA: Diagnosis not present

## 2020-10-29 DIAGNOSIS — I509 Heart failure, unspecified: Secondary | ICD-10-CM | POA: Diagnosis not present

## 2020-10-29 DIAGNOSIS — R2681 Unsteadiness on feet: Secondary | ICD-10-CM | POA: Diagnosis not present

## 2020-10-29 DIAGNOSIS — R0789 Other chest pain: Secondary | ICD-10-CM | POA: Diagnosis not present

## 2020-10-30 DIAGNOSIS — D259 Leiomyoma of uterus, unspecified: Secondary | ICD-10-CM | POA: Diagnosis not present

## 2020-10-30 DIAGNOSIS — I119 Hypertensive heart disease without heart failure: Secondary | ICD-10-CM | POA: Diagnosis not present

## 2020-10-30 DIAGNOSIS — E119 Type 2 diabetes mellitus without complications: Secondary | ICD-10-CM | POA: Diagnosis not present

## 2020-10-30 DIAGNOSIS — Z6834 Body mass index (BMI) 34.0-34.9, adult: Secondary | ICD-10-CM | POA: Diagnosis not present

## 2020-10-30 DIAGNOSIS — E669 Obesity, unspecified: Secondary | ICD-10-CM | POA: Diagnosis not present

## 2020-10-30 DIAGNOSIS — G8929 Other chronic pain: Secondary | ICD-10-CM | POA: Diagnosis not present

## 2020-10-30 DIAGNOSIS — E785 Hyperlipidemia, unspecified: Secondary | ICD-10-CM | POA: Diagnosis not present

## 2020-10-30 DIAGNOSIS — I088 Other rheumatic multiple valve diseases: Secondary | ICD-10-CM | POA: Diagnosis not present

## 2020-10-30 DIAGNOSIS — M545 Low back pain, unspecified: Secondary | ICD-10-CM | POA: Diagnosis not present

## 2020-10-30 NOTE — Telephone Encounter (Signed)
Patient has been seen.

## 2020-10-31 DIAGNOSIS — I088 Other rheumatic multiple valve diseases: Secondary | ICD-10-CM | POA: Diagnosis not present

## 2020-10-31 DIAGNOSIS — E669 Obesity, unspecified: Secondary | ICD-10-CM | POA: Diagnosis not present

## 2020-10-31 DIAGNOSIS — E785 Hyperlipidemia, unspecified: Secondary | ICD-10-CM | POA: Diagnosis not present

## 2020-10-31 DIAGNOSIS — I119 Hypertensive heart disease without heart failure: Secondary | ICD-10-CM | POA: Diagnosis not present

## 2020-10-31 DIAGNOSIS — Z6834 Body mass index (BMI) 34.0-34.9, adult: Secondary | ICD-10-CM | POA: Diagnosis not present

## 2020-10-31 DIAGNOSIS — D259 Leiomyoma of uterus, unspecified: Secondary | ICD-10-CM | POA: Diagnosis not present

## 2020-10-31 DIAGNOSIS — M545 Low back pain, unspecified: Secondary | ICD-10-CM | POA: Diagnosis not present

## 2020-10-31 DIAGNOSIS — E119 Type 2 diabetes mellitus without complications: Secondary | ICD-10-CM | POA: Diagnosis not present

## 2020-10-31 DIAGNOSIS — G8929 Other chronic pain: Secondary | ICD-10-CM | POA: Diagnosis not present

## 2020-11-03 DIAGNOSIS — I119 Hypertensive heart disease without heart failure: Secondary | ICD-10-CM | POA: Diagnosis not present

## 2020-11-03 DIAGNOSIS — E669 Obesity, unspecified: Secondary | ICD-10-CM | POA: Diagnosis not present

## 2020-11-03 DIAGNOSIS — D259 Leiomyoma of uterus, unspecified: Secondary | ICD-10-CM | POA: Diagnosis not present

## 2020-11-03 DIAGNOSIS — I088 Other rheumatic multiple valve diseases: Secondary | ICD-10-CM | POA: Diagnosis not present

## 2020-11-03 DIAGNOSIS — G8929 Other chronic pain: Secondary | ICD-10-CM | POA: Diagnosis not present

## 2020-11-03 DIAGNOSIS — E785 Hyperlipidemia, unspecified: Secondary | ICD-10-CM | POA: Diagnosis not present

## 2020-11-03 DIAGNOSIS — E119 Type 2 diabetes mellitus without complications: Secondary | ICD-10-CM | POA: Diagnosis not present

## 2020-11-03 DIAGNOSIS — Z6834 Body mass index (BMI) 34.0-34.9, adult: Secondary | ICD-10-CM | POA: Diagnosis not present

## 2020-11-03 DIAGNOSIS — M545 Low back pain, unspecified: Secondary | ICD-10-CM | POA: Diagnosis not present

## 2020-11-04 DIAGNOSIS — I119 Hypertensive heart disease without heart failure: Secondary | ICD-10-CM | POA: Diagnosis not present

## 2020-11-04 DIAGNOSIS — E669 Obesity, unspecified: Secondary | ICD-10-CM | POA: Diagnosis not present

## 2020-11-04 DIAGNOSIS — D259 Leiomyoma of uterus, unspecified: Secondary | ICD-10-CM | POA: Diagnosis not present

## 2020-11-04 DIAGNOSIS — G8929 Other chronic pain: Secondary | ICD-10-CM | POA: Diagnosis not present

## 2020-11-04 DIAGNOSIS — Z6834 Body mass index (BMI) 34.0-34.9, adult: Secondary | ICD-10-CM | POA: Diagnosis not present

## 2020-11-04 DIAGNOSIS — E785 Hyperlipidemia, unspecified: Secondary | ICD-10-CM | POA: Diagnosis not present

## 2020-11-04 DIAGNOSIS — I088 Other rheumatic multiple valve diseases: Secondary | ICD-10-CM | POA: Diagnosis not present

## 2020-11-04 DIAGNOSIS — M545 Low back pain, unspecified: Secondary | ICD-10-CM | POA: Diagnosis not present

## 2020-11-04 DIAGNOSIS — E119 Type 2 diabetes mellitus without complications: Secondary | ICD-10-CM | POA: Diagnosis not present

## 2020-11-05 DIAGNOSIS — Z6834 Body mass index (BMI) 34.0-34.9, adult: Secondary | ICD-10-CM | POA: Diagnosis not present

## 2020-11-05 DIAGNOSIS — E119 Type 2 diabetes mellitus without complications: Secondary | ICD-10-CM | POA: Diagnosis not present

## 2020-11-05 DIAGNOSIS — E785 Hyperlipidemia, unspecified: Secondary | ICD-10-CM | POA: Diagnosis not present

## 2020-11-05 DIAGNOSIS — M545 Low back pain, unspecified: Secondary | ICD-10-CM | POA: Diagnosis not present

## 2020-11-05 DIAGNOSIS — G8929 Other chronic pain: Secondary | ICD-10-CM | POA: Diagnosis not present

## 2020-11-05 DIAGNOSIS — I119 Hypertensive heart disease without heart failure: Secondary | ICD-10-CM | POA: Diagnosis not present

## 2020-11-05 DIAGNOSIS — D259 Leiomyoma of uterus, unspecified: Secondary | ICD-10-CM | POA: Diagnosis not present

## 2020-11-05 DIAGNOSIS — I088 Other rheumatic multiple valve diseases: Secondary | ICD-10-CM | POA: Diagnosis not present

## 2020-11-05 DIAGNOSIS — E669 Obesity, unspecified: Secondary | ICD-10-CM | POA: Diagnosis not present

## 2020-11-06 DIAGNOSIS — E669 Obesity, unspecified: Secondary | ICD-10-CM | POA: Diagnosis not present

## 2020-11-06 DIAGNOSIS — G8929 Other chronic pain: Secondary | ICD-10-CM | POA: Diagnosis not present

## 2020-11-06 DIAGNOSIS — E119 Type 2 diabetes mellitus without complications: Secondary | ICD-10-CM | POA: Diagnosis not present

## 2020-11-06 DIAGNOSIS — I088 Other rheumatic multiple valve diseases: Secondary | ICD-10-CM | POA: Diagnosis not present

## 2020-11-06 DIAGNOSIS — D259 Leiomyoma of uterus, unspecified: Secondary | ICD-10-CM | POA: Diagnosis not present

## 2020-11-06 DIAGNOSIS — M545 Low back pain, unspecified: Secondary | ICD-10-CM | POA: Diagnosis not present

## 2020-11-06 DIAGNOSIS — Z6834 Body mass index (BMI) 34.0-34.9, adult: Secondary | ICD-10-CM | POA: Diagnosis not present

## 2020-11-06 DIAGNOSIS — I119 Hypertensive heart disease without heart failure: Secondary | ICD-10-CM | POA: Diagnosis not present

## 2020-11-06 DIAGNOSIS — E785 Hyperlipidemia, unspecified: Secondary | ICD-10-CM | POA: Diagnosis not present

## 2020-11-07 DIAGNOSIS — E119 Type 2 diabetes mellitus without complications: Secondary | ICD-10-CM | POA: Diagnosis not present

## 2020-11-07 DIAGNOSIS — M545 Low back pain, unspecified: Secondary | ICD-10-CM | POA: Diagnosis not present

## 2020-11-07 DIAGNOSIS — E669 Obesity, unspecified: Secondary | ICD-10-CM | POA: Diagnosis not present

## 2020-11-07 DIAGNOSIS — G8929 Other chronic pain: Secondary | ICD-10-CM | POA: Diagnosis not present

## 2020-11-07 DIAGNOSIS — E785 Hyperlipidemia, unspecified: Secondary | ICD-10-CM | POA: Diagnosis not present

## 2020-11-07 DIAGNOSIS — Z6834 Body mass index (BMI) 34.0-34.9, adult: Secondary | ICD-10-CM | POA: Diagnosis not present

## 2020-11-07 DIAGNOSIS — I088 Other rheumatic multiple valve diseases: Secondary | ICD-10-CM | POA: Diagnosis not present

## 2020-11-07 DIAGNOSIS — I119 Hypertensive heart disease without heart failure: Secondary | ICD-10-CM | POA: Diagnosis not present

## 2020-11-07 DIAGNOSIS — D259 Leiomyoma of uterus, unspecified: Secondary | ICD-10-CM | POA: Diagnosis not present

## 2020-11-10 DIAGNOSIS — E785 Hyperlipidemia, unspecified: Secondary | ICD-10-CM | POA: Diagnosis not present

## 2020-11-10 DIAGNOSIS — I088 Other rheumatic multiple valve diseases: Secondary | ICD-10-CM | POA: Diagnosis not present

## 2020-11-10 DIAGNOSIS — M545 Low back pain, unspecified: Secondary | ICD-10-CM | POA: Diagnosis not present

## 2020-11-10 DIAGNOSIS — Z6834 Body mass index (BMI) 34.0-34.9, adult: Secondary | ICD-10-CM | POA: Diagnosis not present

## 2020-11-10 DIAGNOSIS — G8929 Other chronic pain: Secondary | ICD-10-CM | POA: Diagnosis not present

## 2020-11-10 DIAGNOSIS — I119 Hypertensive heart disease without heart failure: Secondary | ICD-10-CM | POA: Diagnosis not present

## 2020-11-10 DIAGNOSIS — E669 Obesity, unspecified: Secondary | ICD-10-CM | POA: Diagnosis not present

## 2020-11-10 DIAGNOSIS — E119 Type 2 diabetes mellitus without complications: Secondary | ICD-10-CM | POA: Diagnosis not present

## 2020-11-10 DIAGNOSIS — D259 Leiomyoma of uterus, unspecified: Secondary | ICD-10-CM | POA: Diagnosis not present

## 2020-11-12 DIAGNOSIS — E785 Hyperlipidemia, unspecified: Secondary | ICD-10-CM | POA: Diagnosis not present

## 2020-11-12 DIAGNOSIS — I119 Hypertensive heart disease without heart failure: Secondary | ICD-10-CM | POA: Diagnosis not present

## 2020-11-12 DIAGNOSIS — D259 Leiomyoma of uterus, unspecified: Secondary | ICD-10-CM | POA: Diagnosis not present

## 2020-11-12 DIAGNOSIS — Z6834 Body mass index (BMI) 34.0-34.9, adult: Secondary | ICD-10-CM | POA: Diagnosis not present

## 2020-11-12 DIAGNOSIS — I088 Other rheumatic multiple valve diseases: Secondary | ICD-10-CM | POA: Diagnosis not present

## 2020-11-12 DIAGNOSIS — M545 Low back pain, unspecified: Secondary | ICD-10-CM | POA: Diagnosis not present

## 2020-11-12 DIAGNOSIS — E119 Type 2 diabetes mellitus without complications: Secondary | ICD-10-CM | POA: Diagnosis not present

## 2020-11-12 DIAGNOSIS — E669 Obesity, unspecified: Secondary | ICD-10-CM | POA: Diagnosis not present

## 2020-11-12 DIAGNOSIS — G8929 Other chronic pain: Secondary | ICD-10-CM | POA: Diagnosis not present

## 2020-11-13 DIAGNOSIS — D259 Leiomyoma of uterus, unspecified: Secondary | ICD-10-CM | POA: Diagnosis not present

## 2020-11-13 DIAGNOSIS — E669 Obesity, unspecified: Secondary | ICD-10-CM | POA: Diagnosis not present

## 2020-11-13 DIAGNOSIS — G8929 Other chronic pain: Secondary | ICD-10-CM | POA: Diagnosis not present

## 2020-11-13 DIAGNOSIS — I119 Hypertensive heart disease without heart failure: Secondary | ICD-10-CM | POA: Diagnosis not present

## 2020-11-13 DIAGNOSIS — I088 Other rheumatic multiple valve diseases: Secondary | ICD-10-CM | POA: Diagnosis not present

## 2020-11-13 DIAGNOSIS — E119 Type 2 diabetes mellitus without complications: Secondary | ICD-10-CM | POA: Diagnosis not present

## 2020-11-13 DIAGNOSIS — M545 Low back pain, unspecified: Secondary | ICD-10-CM | POA: Diagnosis not present

## 2020-11-13 DIAGNOSIS — E785 Hyperlipidemia, unspecified: Secondary | ICD-10-CM | POA: Diagnosis not present

## 2020-11-13 DIAGNOSIS — Z6834 Body mass index (BMI) 34.0-34.9, adult: Secondary | ICD-10-CM | POA: Diagnosis not present

## 2020-11-14 DIAGNOSIS — E785 Hyperlipidemia, unspecified: Secondary | ICD-10-CM | POA: Diagnosis not present

## 2020-11-14 DIAGNOSIS — I119 Hypertensive heart disease without heart failure: Secondary | ICD-10-CM | POA: Diagnosis not present

## 2020-11-14 DIAGNOSIS — G8929 Other chronic pain: Secondary | ICD-10-CM | POA: Diagnosis not present

## 2020-11-14 DIAGNOSIS — Z6834 Body mass index (BMI) 34.0-34.9, adult: Secondary | ICD-10-CM | POA: Diagnosis not present

## 2020-11-14 DIAGNOSIS — E669 Obesity, unspecified: Secondary | ICD-10-CM | POA: Diagnosis not present

## 2020-11-14 DIAGNOSIS — D259 Leiomyoma of uterus, unspecified: Secondary | ICD-10-CM | POA: Diagnosis not present

## 2020-11-14 DIAGNOSIS — I088 Other rheumatic multiple valve diseases: Secondary | ICD-10-CM | POA: Diagnosis not present

## 2020-11-14 DIAGNOSIS — E119 Type 2 diabetes mellitus without complications: Secondary | ICD-10-CM | POA: Diagnosis not present

## 2020-11-14 DIAGNOSIS — M545 Low back pain, unspecified: Secondary | ICD-10-CM | POA: Diagnosis not present

## 2020-11-18 DIAGNOSIS — D259 Leiomyoma of uterus, unspecified: Secondary | ICD-10-CM | POA: Diagnosis not present

## 2020-11-18 DIAGNOSIS — G8929 Other chronic pain: Secondary | ICD-10-CM | POA: Diagnosis not present

## 2020-11-18 DIAGNOSIS — Z6834 Body mass index (BMI) 34.0-34.9, adult: Secondary | ICD-10-CM | POA: Diagnosis not present

## 2020-11-18 DIAGNOSIS — I088 Other rheumatic multiple valve diseases: Secondary | ICD-10-CM | POA: Diagnosis not present

## 2020-11-18 DIAGNOSIS — E785 Hyperlipidemia, unspecified: Secondary | ICD-10-CM | POA: Diagnosis not present

## 2020-11-18 DIAGNOSIS — E669 Obesity, unspecified: Secondary | ICD-10-CM | POA: Diagnosis not present

## 2020-11-18 DIAGNOSIS — M545 Low back pain, unspecified: Secondary | ICD-10-CM | POA: Diagnosis not present

## 2020-11-18 DIAGNOSIS — E119 Type 2 diabetes mellitus without complications: Secondary | ICD-10-CM | POA: Diagnosis not present

## 2020-11-18 DIAGNOSIS — I119 Hypertensive heart disease without heart failure: Secondary | ICD-10-CM | POA: Diagnosis not present

## 2020-11-19 DIAGNOSIS — E669 Obesity, unspecified: Secondary | ICD-10-CM | POA: Diagnosis not present

## 2020-11-19 DIAGNOSIS — I088 Other rheumatic multiple valve diseases: Secondary | ICD-10-CM | POA: Diagnosis not present

## 2020-11-19 DIAGNOSIS — M545 Low back pain, unspecified: Secondary | ICD-10-CM | POA: Diagnosis not present

## 2020-11-19 DIAGNOSIS — E119 Type 2 diabetes mellitus without complications: Secondary | ICD-10-CM | POA: Diagnosis not present

## 2020-11-19 DIAGNOSIS — I119 Hypertensive heart disease without heart failure: Secondary | ICD-10-CM | POA: Diagnosis not present

## 2020-11-19 DIAGNOSIS — Z6834 Body mass index (BMI) 34.0-34.9, adult: Secondary | ICD-10-CM | POA: Diagnosis not present

## 2020-11-19 DIAGNOSIS — D259 Leiomyoma of uterus, unspecified: Secondary | ICD-10-CM | POA: Diagnosis not present

## 2020-11-19 DIAGNOSIS — G8929 Other chronic pain: Secondary | ICD-10-CM | POA: Diagnosis not present

## 2020-11-19 DIAGNOSIS — E785 Hyperlipidemia, unspecified: Secondary | ICD-10-CM | POA: Diagnosis not present

## 2020-11-20 ENCOUNTER — Ambulatory Visit: Payer: Medicare HMO | Admitting: Critical Care Medicine

## 2020-11-20 DIAGNOSIS — E785 Hyperlipidemia, unspecified: Secondary | ICD-10-CM | POA: Diagnosis not present

## 2020-11-20 DIAGNOSIS — D259 Leiomyoma of uterus, unspecified: Secondary | ICD-10-CM | POA: Diagnosis not present

## 2020-11-20 DIAGNOSIS — I119 Hypertensive heart disease without heart failure: Secondary | ICD-10-CM | POA: Diagnosis not present

## 2020-11-20 DIAGNOSIS — M545 Low back pain, unspecified: Secondary | ICD-10-CM | POA: Diagnosis not present

## 2020-11-20 DIAGNOSIS — G8929 Other chronic pain: Secondary | ICD-10-CM | POA: Diagnosis not present

## 2020-11-20 DIAGNOSIS — E669 Obesity, unspecified: Secondary | ICD-10-CM | POA: Diagnosis not present

## 2020-11-20 DIAGNOSIS — E119 Type 2 diabetes mellitus without complications: Secondary | ICD-10-CM | POA: Diagnosis not present

## 2020-11-20 DIAGNOSIS — I088 Other rheumatic multiple valve diseases: Secondary | ICD-10-CM | POA: Diagnosis not present

## 2020-11-20 DIAGNOSIS — Z6834 Body mass index (BMI) 34.0-34.9, adult: Secondary | ICD-10-CM | POA: Diagnosis not present

## 2020-11-24 DIAGNOSIS — G8929 Other chronic pain: Secondary | ICD-10-CM | POA: Diagnosis not present

## 2020-11-24 DIAGNOSIS — I119 Hypertensive heart disease without heart failure: Secondary | ICD-10-CM | POA: Diagnosis not present

## 2020-11-24 DIAGNOSIS — E119 Type 2 diabetes mellitus without complications: Secondary | ICD-10-CM | POA: Diagnosis not present

## 2020-11-24 DIAGNOSIS — I088 Other rheumatic multiple valve diseases: Secondary | ICD-10-CM | POA: Diagnosis not present

## 2020-11-24 DIAGNOSIS — E785 Hyperlipidemia, unspecified: Secondary | ICD-10-CM | POA: Diagnosis not present

## 2020-11-24 DIAGNOSIS — E669 Obesity, unspecified: Secondary | ICD-10-CM | POA: Diagnosis not present

## 2020-11-24 DIAGNOSIS — Z6834 Body mass index (BMI) 34.0-34.9, adult: Secondary | ICD-10-CM | POA: Diagnosis not present

## 2020-11-24 DIAGNOSIS — M545 Low back pain, unspecified: Secondary | ICD-10-CM | POA: Diagnosis not present

## 2020-11-24 DIAGNOSIS — D259 Leiomyoma of uterus, unspecified: Secondary | ICD-10-CM | POA: Diagnosis not present

## 2020-11-25 DIAGNOSIS — E785 Hyperlipidemia, unspecified: Secondary | ICD-10-CM | POA: Diagnosis not present

## 2020-11-25 DIAGNOSIS — E669 Obesity, unspecified: Secondary | ICD-10-CM | POA: Diagnosis not present

## 2020-11-25 DIAGNOSIS — Z6834 Body mass index (BMI) 34.0-34.9, adult: Secondary | ICD-10-CM | POA: Diagnosis not present

## 2020-11-25 DIAGNOSIS — I088 Other rheumatic multiple valve diseases: Secondary | ICD-10-CM | POA: Diagnosis not present

## 2020-11-25 DIAGNOSIS — D259 Leiomyoma of uterus, unspecified: Secondary | ICD-10-CM | POA: Diagnosis not present

## 2020-11-25 DIAGNOSIS — G8929 Other chronic pain: Secondary | ICD-10-CM | POA: Diagnosis not present

## 2020-11-25 DIAGNOSIS — M545 Low back pain, unspecified: Secondary | ICD-10-CM | POA: Diagnosis not present

## 2020-11-25 DIAGNOSIS — E119 Type 2 diabetes mellitus without complications: Secondary | ICD-10-CM | POA: Diagnosis not present

## 2020-11-25 DIAGNOSIS — I119 Hypertensive heart disease without heart failure: Secondary | ICD-10-CM | POA: Diagnosis not present

## 2020-11-27 DIAGNOSIS — E119 Type 2 diabetes mellitus without complications: Secondary | ICD-10-CM | POA: Diagnosis not present

## 2020-11-27 DIAGNOSIS — Z6834 Body mass index (BMI) 34.0-34.9, adult: Secondary | ICD-10-CM | POA: Diagnosis not present

## 2020-11-27 DIAGNOSIS — E785 Hyperlipidemia, unspecified: Secondary | ICD-10-CM | POA: Diagnosis not present

## 2020-11-27 DIAGNOSIS — D259 Leiomyoma of uterus, unspecified: Secondary | ICD-10-CM | POA: Diagnosis not present

## 2020-11-27 DIAGNOSIS — I088 Other rheumatic multiple valve diseases: Secondary | ICD-10-CM | POA: Diagnosis not present

## 2020-11-27 DIAGNOSIS — M545 Low back pain, unspecified: Secondary | ICD-10-CM | POA: Diagnosis not present

## 2020-11-27 DIAGNOSIS — G8929 Other chronic pain: Secondary | ICD-10-CM | POA: Diagnosis not present

## 2020-11-27 DIAGNOSIS — I119 Hypertensive heart disease without heart failure: Secondary | ICD-10-CM | POA: Diagnosis not present

## 2020-11-27 DIAGNOSIS — E669 Obesity, unspecified: Secondary | ICD-10-CM | POA: Diagnosis not present

## 2020-12-02 DIAGNOSIS — D259 Leiomyoma of uterus, unspecified: Secondary | ICD-10-CM | POA: Diagnosis not present

## 2020-12-02 DIAGNOSIS — M545 Low back pain, unspecified: Secondary | ICD-10-CM | POA: Diagnosis not present

## 2020-12-02 DIAGNOSIS — I088 Other rheumatic multiple valve diseases: Secondary | ICD-10-CM | POA: Diagnosis not present

## 2020-12-02 DIAGNOSIS — E669 Obesity, unspecified: Secondary | ICD-10-CM | POA: Diagnosis not present

## 2020-12-02 DIAGNOSIS — Z6834 Body mass index (BMI) 34.0-34.9, adult: Secondary | ICD-10-CM | POA: Diagnosis not present

## 2020-12-02 DIAGNOSIS — E785 Hyperlipidemia, unspecified: Secondary | ICD-10-CM | POA: Diagnosis not present

## 2020-12-02 DIAGNOSIS — E119 Type 2 diabetes mellitus without complications: Secondary | ICD-10-CM | POA: Diagnosis not present

## 2020-12-02 DIAGNOSIS — G8929 Other chronic pain: Secondary | ICD-10-CM | POA: Diagnosis not present

## 2020-12-02 DIAGNOSIS — I119 Hypertensive heart disease without heart failure: Secondary | ICD-10-CM | POA: Diagnosis not present

## 2020-12-04 DIAGNOSIS — D259 Leiomyoma of uterus, unspecified: Secondary | ICD-10-CM | POA: Diagnosis not present

## 2020-12-04 DIAGNOSIS — G8929 Other chronic pain: Secondary | ICD-10-CM | POA: Diagnosis not present

## 2020-12-04 DIAGNOSIS — Z6834 Body mass index (BMI) 34.0-34.9, adult: Secondary | ICD-10-CM | POA: Diagnosis not present

## 2020-12-04 DIAGNOSIS — M545 Low back pain, unspecified: Secondary | ICD-10-CM | POA: Diagnosis not present

## 2020-12-04 DIAGNOSIS — E669 Obesity, unspecified: Secondary | ICD-10-CM | POA: Diagnosis not present

## 2020-12-04 DIAGNOSIS — I088 Other rheumatic multiple valve diseases: Secondary | ICD-10-CM | POA: Diagnosis not present

## 2020-12-04 DIAGNOSIS — E119 Type 2 diabetes mellitus without complications: Secondary | ICD-10-CM | POA: Diagnosis not present

## 2020-12-04 DIAGNOSIS — I119 Hypertensive heart disease without heart failure: Secondary | ICD-10-CM | POA: Diagnosis not present

## 2020-12-04 DIAGNOSIS — E785 Hyperlipidemia, unspecified: Secondary | ICD-10-CM | POA: Diagnosis not present

## 2020-12-08 DIAGNOSIS — E119 Type 2 diabetes mellitus without complications: Secondary | ICD-10-CM | POA: Diagnosis not present

## 2020-12-08 DIAGNOSIS — E669 Obesity, unspecified: Secondary | ICD-10-CM | POA: Diagnosis not present

## 2020-12-08 DIAGNOSIS — D259 Leiomyoma of uterus, unspecified: Secondary | ICD-10-CM | POA: Diagnosis not present

## 2020-12-08 DIAGNOSIS — G8929 Other chronic pain: Secondary | ICD-10-CM | POA: Diagnosis not present

## 2020-12-08 DIAGNOSIS — I088 Other rheumatic multiple valve diseases: Secondary | ICD-10-CM | POA: Diagnosis not present

## 2020-12-08 DIAGNOSIS — M545 Low back pain, unspecified: Secondary | ICD-10-CM | POA: Diagnosis not present

## 2020-12-08 DIAGNOSIS — E785 Hyperlipidemia, unspecified: Secondary | ICD-10-CM | POA: Diagnosis not present

## 2020-12-08 DIAGNOSIS — Z6834 Body mass index (BMI) 34.0-34.9, adult: Secondary | ICD-10-CM | POA: Diagnosis not present

## 2020-12-08 DIAGNOSIS — I119 Hypertensive heart disease without heart failure: Secondary | ICD-10-CM | POA: Diagnosis not present

## 2020-12-09 DIAGNOSIS — M545 Low back pain, unspecified: Secondary | ICD-10-CM | POA: Diagnosis not present

## 2020-12-09 DIAGNOSIS — E119 Type 2 diabetes mellitus without complications: Secondary | ICD-10-CM | POA: Diagnosis not present

## 2020-12-09 DIAGNOSIS — E785 Hyperlipidemia, unspecified: Secondary | ICD-10-CM | POA: Diagnosis not present

## 2020-12-09 DIAGNOSIS — Z6834 Body mass index (BMI) 34.0-34.9, adult: Secondary | ICD-10-CM | POA: Diagnosis not present

## 2020-12-09 DIAGNOSIS — G8929 Other chronic pain: Secondary | ICD-10-CM | POA: Diagnosis not present

## 2020-12-09 DIAGNOSIS — E669 Obesity, unspecified: Secondary | ICD-10-CM | POA: Diagnosis not present

## 2020-12-09 DIAGNOSIS — I088 Other rheumatic multiple valve diseases: Secondary | ICD-10-CM | POA: Diagnosis not present

## 2020-12-09 DIAGNOSIS — D259 Leiomyoma of uterus, unspecified: Secondary | ICD-10-CM | POA: Diagnosis not present

## 2020-12-09 DIAGNOSIS — I119 Hypertensive heart disease without heart failure: Secondary | ICD-10-CM | POA: Diagnosis not present

## 2020-12-11 ENCOUNTER — Other Ambulatory Visit: Payer: Self-pay | Admitting: Student

## 2020-12-11 DIAGNOSIS — G20 Parkinson's disease: Secondary | ICD-10-CM

## 2020-12-12 DIAGNOSIS — I119 Hypertensive heart disease without heart failure: Secondary | ICD-10-CM | POA: Diagnosis not present

## 2020-12-12 DIAGNOSIS — E119 Type 2 diabetes mellitus without complications: Secondary | ICD-10-CM | POA: Diagnosis not present

## 2020-12-12 DIAGNOSIS — D259 Leiomyoma of uterus, unspecified: Secondary | ICD-10-CM | POA: Diagnosis not present

## 2020-12-12 DIAGNOSIS — G8929 Other chronic pain: Secondary | ICD-10-CM | POA: Diagnosis not present

## 2020-12-12 DIAGNOSIS — I088 Other rheumatic multiple valve diseases: Secondary | ICD-10-CM | POA: Diagnosis not present

## 2020-12-12 DIAGNOSIS — M545 Low back pain, unspecified: Secondary | ICD-10-CM | POA: Diagnosis not present

## 2020-12-12 DIAGNOSIS — E669 Obesity, unspecified: Secondary | ICD-10-CM | POA: Diagnosis not present

## 2020-12-12 DIAGNOSIS — Z6834 Body mass index (BMI) 34.0-34.9, adult: Secondary | ICD-10-CM | POA: Diagnosis not present

## 2020-12-12 DIAGNOSIS — E785 Hyperlipidemia, unspecified: Secondary | ICD-10-CM | POA: Diagnosis not present

## 2020-12-15 DIAGNOSIS — I119 Hypertensive heart disease without heart failure: Secondary | ICD-10-CM | POA: Diagnosis not present

## 2020-12-15 DIAGNOSIS — E119 Type 2 diabetes mellitus without complications: Secondary | ICD-10-CM | POA: Diagnosis not present

## 2020-12-15 DIAGNOSIS — Z6834 Body mass index (BMI) 34.0-34.9, adult: Secondary | ICD-10-CM | POA: Diagnosis not present

## 2020-12-15 DIAGNOSIS — E785 Hyperlipidemia, unspecified: Secondary | ICD-10-CM | POA: Diagnosis not present

## 2020-12-15 DIAGNOSIS — M545 Low back pain, unspecified: Secondary | ICD-10-CM | POA: Diagnosis not present

## 2020-12-15 DIAGNOSIS — D259 Leiomyoma of uterus, unspecified: Secondary | ICD-10-CM | POA: Diagnosis not present

## 2020-12-15 DIAGNOSIS — I088 Other rheumatic multiple valve diseases: Secondary | ICD-10-CM | POA: Diagnosis not present

## 2020-12-15 DIAGNOSIS — G8929 Other chronic pain: Secondary | ICD-10-CM | POA: Diagnosis not present

## 2020-12-15 DIAGNOSIS — E669 Obesity, unspecified: Secondary | ICD-10-CM | POA: Diagnosis not present

## 2020-12-16 DIAGNOSIS — D259 Leiomyoma of uterus, unspecified: Secondary | ICD-10-CM | POA: Diagnosis not present

## 2020-12-16 DIAGNOSIS — G8929 Other chronic pain: Secondary | ICD-10-CM | POA: Diagnosis not present

## 2020-12-16 DIAGNOSIS — M545 Low back pain, unspecified: Secondary | ICD-10-CM | POA: Diagnosis not present

## 2020-12-16 DIAGNOSIS — E669 Obesity, unspecified: Secondary | ICD-10-CM | POA: Diagnosis not present

## 2020-12-16 DIAGNOSIS — E119 Type 2 diabetes mellitus without complications: Secondary | ICD-10-CM | POA: Diagnosis not present

## 2020-12-16 DIAGNOSIS — Z6834 Body mass index (BMI) 34.0-34.9, adult: Secondary | ICD-10-CM | POA: Diagnosis not present

## 2020-12-16 DIAGNOSIS — I088 Other rheumatic multiple valve diseases: Secondary | ICD-10-CM | POA: Diagnosis not present

## 2020-12-16 DIAGNOSIS — I119 Hypertensive heart disease without heart failure: Secondary | ICD-10-CM | POA: Diagnosis not present

## 2020-12-16 DIAGNOSIS — E785 Hyperlipidemia, unspecified: Secondary | ICD-10-CM | POA: Diagnosis not present

## 2020-12-17 ENCOUNTER — Encounter: Payer: Self-pay | Admitting: Neurology

## 2020-12-17 DIAGNOSIS — E119 Type 2 diabetes mellitus without complications: Secondary | ICD-10-CM | POA: Diagnosis not present

## 2020-12-17 DIAGNOSIS — E669 Obesity, unspecified: Secondary | ICD-10-CM | POA: Diagnosis not present

## 2020-12-17 DIAGNOSIS — Z6834 Body mass index (BMI) 34.0-34.9, adult: Secondary | ICD-10-CM | POA: Diagnosis not present

## 2020-12-17 DIAGNOSIS — D259 Leiomyoma of uterus, unspecified: Secondary | ICD-10-CM | POA: Diagnosis not present

## 2020-12-17 DIAGNOSIS — I088 Other rheumatic multiple valve diseases: Secondary | ICD-10-CM | POA: Diagnosis not present

## 2020-12-17 DIAGNOSIS — I119 Hypertensive heart disease without heart failure: Secondary | ICD-10-CM | POA: Diagnosis not present

## 2020-12-17 DIAGNOSIS — E785 Hyperlipidemia, unspecified: Secondary | ICD-10-CM | POA: Diagnosis not present

## 2020-12-17 DIAGNOSIS — M545 Low back pain, unspecified: Secondary | ICD-10-CM | POA: Diagnosis not present

## 2020-12-17 DIAGNOSIS — G8929 Other chronic pain: Secondary | ICD-10-CM | POA: Diagnosis not present

## 2020-12-22 ENCOUNTER — Other Ambulatory Visit: Payer: Self-pay | Admitting: Student

## 2020-12-22 DIAGNOSIS — Z1231 Encounter for screening mammogram for malignant neoplasm of breast: Secondary | ICD-10-CM

## 2020-12-23 DIAGNOSIS — E785 Hyperlipidemia, unspecified: Secondary | ICD-10-CM | POA: Diagnosis not present

## 2020-12-23 DIAGNOSIS — F985 Adult onset fluency disorder: Secondary | ICD-10-CM | POA: Diagnosis not present

## 2020-12-23 DIAGNOSIS — E669 Obesity, unspecified: Secondary | ICD-10-CM | POA: Diagnosis not present

## 2020-12-23 DIAGNOSIS — Z6834 Body mass index (BMI) 34.0-34.9, adult: Secondary | ICD-10-CM | POA: Diagnosis not present

## 2020-12-23 DIAGNOSIS — E119 Type 2 diabetes mellitus without complications: Secondary | ICD-10-CM | POA: Diagnosis not present

## 2020-12-23 DIAGNOSIS — G8929 Other chronic pain: Secondary | ICD-10-CM | POA: Diagnosis not present

## 2020-12-23 DIAGNOSIS — D259 Leiomyoma of uterus, unspecified: Secondary | ICD-10-CM | POA: Diagnosis not present

## 2020-12-23 DIAGNOSIS — I119 Hypertensive heart disease without heart failure: Secondary | ICD-10-CM | POA: Diagnosis not present

## 2020-12-23 DIAGNOSIS — I088 Other rheumatic multiple valve diseases: Secondary | ICD-10-CM | POA: Diagnosis not present

## 2020-12-24 DIAGNOSIS — E119 Type 2 diabetes mellitus without complications: Secondary | ICD-10-CM | POA: Diagnosis not present

## 2020-12-24 DIAGNOSIS — I119 Hypertensive heart disease without heart failure: Secondary | ICD-10-CM | POA: Diagnosis not present

## 2020-12-24 DIAGNOSIS — G8929 Other chronic pain: Secondary | ICD-10-CM | POA: Diagnosis not present

## 2020-12-24 DIAGNOSIS — F985 Adult onset fluency disorder: Secondary | ICD-10-CM | POA: Diagnosis not present

## 2020-12-24 DIAGNOSIS — E785 Hyperlipidemia, unspecified: Secondary | ICD-10-CM | POA: Diagnosis not present

## 2020-12-24 DIAGNOSIS — E669 Obesity, unspecified: Secondary | ICD-10-CM | POA: Diagnosis not present

## 2020-12-24 DIAGNOSIS — D259 Leiomyoma of uterus, unspecified: Secondary | ICD-10-CM | POA: Diagnosis not present

## 2020-12-24 DIAGNOSIS — I088 Other rheumatic multiple valve diseases: Secondary | ICD-10-CM | POA: Diagnosis not present

## 2020-12-24 DIAGNOSIS — Z6834 Body mass index (BMI) 34.0-34.9, adult: Secondary | ICD-10-CM | POA: Diagnosis not present

## 2020-12-29 DIAGNOSIS — I119 Hypertensive heart disease without heart failure: Secondary | ICD-10-CM | POA: Diagnosis not present

## 2020-12-29 DIAGNOSIS — E669 Obesity, unspecified: Secondary | ICD-10-CM | POA: Diagnosis not present

## 2020-12-29 DIAGNOSIS — F985 Adult onset fluency disorder: Secondary | ICD-10-CM | POA: Diagnosis not present

## 2020-12-29 DIAGNOSIS — G8929 Other chronic pain: Secondary | ICD-10-CM | POA: Diagnosis not present

## 2020-12-29 DIAGNOSIS — Z6834 Body mass index (BMI) 34.0-34.9, adult: Secondary | ICD-10-CM | POA: Diagnosis not present

## 2020-12-29 DIAGNOSIS — I088 Other rheumatic multiple valve diseases: Secondary | ICD-10-CM | POA: Diagnosis not present

## 2020-12-29 DIAGNOSIS — D259 Leiomyoma of uterus, unspecified: Secondary | ICD-10-CM | POA: Diagnosis not present

## 2020-12-29 DIAGNOSIS — E119 Type 2 diabetes mellitus without complications: Secondary | ICD-10-CM | POA: Diagnosis not present

## 2020-12-29 DIAGNOSIS — E785 Hyperlipidemia, unspecified: Secondary | ICD-10-CM | POA: Diagnosis not present

## 2021-01-03 ENCOUNTER — Ambulatory Visit
Admission: RE | Admit: 2021-01-03 | Discharge: 2021-01-03 | Disposition: A | Discharge status: Home / Self Care | Payer: Medicare HMO | Source: Ambulatory Visit | Attending: Student | Admitting: Student

## 2021-01-03 DIAGNOSIS — G20 Parkinson's disease: Secondary | ICD-10-CM

## 2021-01-03 MED ORDER — GADOBENATE DIMEGLUMINE 529 MG/ML IV SOLN
17.0000 mL | Freq: Once | INTRAVENOUS | Status: AC | PRN
  Administered 2021-01-03: 17 mL via INTRAVENOUS

## 2021-01-05 ENCOUNTER — Encounter: Payer: Medicare HMO | Admitting: Internal Medicine

## 2021-01-06 NOTE — Progress Notes (Signed)
Assessment/Plan:  1.  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  We talked about nature, etiology and pathophysiology. We talked about how the symptoms, course and prognosis differ from Parkinson's Disease.  We talked about the risks, particularly for falls and aspiration.   -I will refer the patient for home physical therapy.  She does not drive and lives alone.  -We will consider MBE in the future, but really has no symptoms right now  -Discussed the value of levodopa.  Discussed that she will certainly try it and see if it helps.  Often times, it is not as valuable in atypical states as traditional Parkinson's disease.  We will initiate carbidopa/levodopa 25/100 and slowly work up to 1 tablet three times a day.  This likely won't be enough medication but this is what we will start with for now and we will see how she does.  If it is not helpful, we certainly can push up to higher dosages and see if we get any benefit from the medication.  -She met with my LCSW today.  Resources provided.  2.  eye opening apraxia  -Discussed the value of Botox.  She can let us know if she would like to proceed with that in the future.  Subjective:   Kristi Campos was seen today in the movement disorders clinic for neurologic consultation at the request of Ellyn Hack, MD.  The consultation is for the evaluation of shuffling gait, speech change and swallowing trouble for 2 years per referring notes.    Patient has seen [facility] neurology.  She was seen by Dr. Anne Hahn, most recently in September, 2021, for chronic low back pain.  Patient was already apparently being managed for her chronic pain with Dr. Horald Chestnut with steroid injections and Dr. Anne Hahn felt she did not need to be followed up any longer.   He did mention that the patient had a wide-based gait and shuffled.  Patient is diabetic.  He noted that patient had an EMG in December, 2020 of the right lower extremity that was normal, without evidence of neuropathy or lumbosacral radiculopathy.  MRI brain was just completed January 03, 2021.  I personally reviewed this.  There was mild white matter disease.   Specific Symptoms:  Tremor: Yes.  , occasionally the R hand - both with use and at rest.   Family hx of similar:  No. Voice: slurry and "stammers" Sleep: sleeps well  Vivid Dreams:  No.  Acting out dreams:  No. Wet Pillows: No. Postural symptoms:  Yes.    Falls?  Yes.  , fell one time last week but feet stuck to the ground and fell.  No falls for months before that.  No hx of fx with falls Bradykinesia symptoms: shuffling gait, slow movements, difficulty getting out of a chair, and difficulty regaining balance Loss of smell:  No. Loss of taste:  No. Urinary Incontinence:  No. (Rarely) Difficulty Swallowing:  No. Handwriting, micrographia: Yes.   Trouble with ADL's:  No.  Trouble buttoning clothing: No. Depression:  No. Memory changes:  No. Hallucinations:  No.  visual distortions: No. N/V:  No. Lightheaded:  Yes.    Syncope: No. Diplopia:  No. Dyskinesia:  No. Prior exposure to reglan/antipsychotics: No. Admits to eye opening apraxia:  yes  PREVIOUS MEDICATIONS:  none to date  ALLERGIES:  No Known Allergies  CURRENT MEDICATIONS:  Current Outpatient Medications  Medication Instructions   Artificial Tear Ointment (DRY EYES OP) 1 drop, Both Eyes, 4 times daily   aspirin EC 81 mg, Daily   atorvastatin (LIPITOR) 10 mg, Oral, Daily   glipiZIDE (GLUCOTROL XL) 2.5 mg, Oral, Every morning   losartan-hydrochlorothiazide (HYZAAR) 100-25 MG tablet TAKE 1 TABLET BY MOUTH DAILY.   meloxicam (MOBIC) 7.5 mg, Oral, Daily   nitroGLYCERIN (NITROSTAT) 0.4 mg, Sublingual, Every 5 min PRN   pantoprazole (PROTONIX) 40 mg, Oral, Daily    tiZANidine (ZANAFLEX) 4 mg, Oral, 3 times daily PRN    Objective:   VITALS:   Vitals:   01/08/21 0954  BP: 110/70  Pulse: 97  SpO2: 99%  Weight: 189 lb (85.7 kg)  Height: $Remove'5\' 2"'WEtYGax$  (1.575 m)    GEN:  The patient appears stated age and is in NAD. HEENT:  Normocephalic, atraumatic.  The mucous membranes are moist. The superficial temporal arteries are without ropiness or tenderness. CV:  RRR Lungs:  CTAB.  Some doe Neck/HEME:  There are no carotid bruits bilaterally.  Neurological examination:  Orientation: The patient is alert and oriented x3.  Cranial nerves: There is good facial symmetry. There is facial hypomimia.  Extraocular muscles are intact.  Downgaze was intact, as was upgaze today.  No significant square wave jerks.  The visual fields are full to confrontational testing. The speech is fluent and slightly dysarthric. She has trouble with the gutteral sounds.  Soft palate rises symmetrically and there is no tongue deviation. Hearing is intact to conversational tone. Sensation: Sensation is intact to light and pinprick throughout (facial, trunk, extremities). Vibration is intact at the bilateral big toe. There is no extinction with double simultaneous stimulation. There is no sensory dermatomal level identified. Motor: Strength is 5/5 in the bilateral upper and lower extremities.   Shoulder shrug is equal and symmetric.  There is no pronator drift. Deep tendon reflexes: Deep tendon reflexes are 2-/4 at the bilateral biceps, triceps, brachioradialis, patella and achilles. Plantar responses are downgoing bilaterally.  Movement examination: Tone: There is normal tone in the bilateral upper extremities.  The tone in the lower extremities is normal.  Abnormal movements: None Coordination:  There is minimal decremation with RAM's in the upper extremities, but very significant decremation in the bilateral lower extremities. Gait and Station: The patient has mild difficulty arising out  of a deep-seated chair without the use of the hands.  She has start hesitation.  Gait is shuffling.  She walks on the toes, especially on the right.  She turns en bloc and freezes in the turn.    I have reviewed and interpreted the following labs independently   Chemistry      Component Value Date/Time   NA 138 10/18/2020 0544   NA 143 12/06/2019 1035   K 3.5 10/18/2020 0544   CL 106 10/18/2020 0544   CO2 24 10/18/2020 0544   BUN 16 10/18/2020 0544   BUN 16 12/06/2019 1035   CREATININE 0.93 10/18/2020 0544      Component Value Date/Time   CALCIUM 9.2 10/18/2020 0544   ALKPHOS 56 10/18/2020 0544   AST 18 10/18/2020 0544   ALT 13 10/18/2020 0544   BILITOT 0.5 10/18/2020 0544   BILITOT 0.4 12/06/2019 1035      Lab Results  Component Value Date   TSH 1.010 12/06/2019   Lab Results  Component Value Date   WBC  6.5 10/18/2020   HGB 12.7 10/18/2020   HCT 37.9 10/18/2020   MCV 91.8 10/18/2020   PLT 239 10/18/2020     Total time spent on today's visit was 60 minutes, including both face-to-face time and nonface-to-face time.  Time included that spent on review of records (prior notes available to me/labs/imaging if pertinent), discussing treatment and goals, answering patient's questions and coordinating care.  Cc:  Roselee Nova, MD

## 2021-01-08 ENCOUNTER — Other Ambulatory Visit: Payer: Self-pay

## 2021-01-08 ENCOUNTER — Encounter: Payer: Self-pay | Admitting: Neurology

## 2021-01-08 ENCOUNTER — Ambulatory Visit: Payer: Medicare HMO | Admitting: Neurology

## 2021-01-08 VITALS — BP 110/70 | HR 97 | Ht 62.0 in | Wt 189.0 lb

## 2021-01-08 DIAGNOSIS — I119 Hypertensive heart disease without heart failure: Secondary | ICD-10-CM | POA: Diagnosis not present

## 2021-01-08 DIAGNOSIS — R482 Apraxia: Secondary | ICD-10-CM

## 2021-01-08 DIAGNOSIS — G231 Progressive supranuclear ophthalmoplegia [Steele-Richardson-Olszewski]: Secondary | ICD-10-CM

## 2021-01-08 DIAGNOSIS — E119 Type 2 diabetes mellitus without complications: Secondary | ICD-10-CM | POA: Diagnosis not present

## 2021-01-08 DIAGNOSIS — D259 Leiomyoma of uterus, unspecified: Secondary | ICD-10-CM | POA: Diagnosis not present

## 2021-01-08 DIAGNOSIS — G8929 Other chronic pain: Secondary | ICD-10-CM | POA: Diagnosis not present

## 2021-01-08 DIAGNOSIS — E785 Hyperlipidemia, unspecified: Secondary | ICD-10-CM | POA: Diagnosis not present

## 2021-01-08 DIAGNOSIS — Z6834 Body mass index (BMI) 34.0-34.9, adult: Secondary | ICD-10-CM | POA: Diagnosis not present

## 2021-01-08 DIAGNOSIS — F985 Adult onset fluency disorder: Secondary | ICD-10-CM | POA: Diagnosis not present

## 2021-01-08 DIAGNOSIS — I088 Other rheumatic multiple valve diseases: Secondary | ICD-10-CM | POA: Diagnosis not present

## 2021-01-08 DIAGNOSIS — E669 Obesity, unspecified: Secondary | ICD-10-CM | POA: Diagnosis not present

## 2021-01-08 MED ORDER — CARBIDOPA-LEVODOPA 25-100 MG PO TABS: 1.0000 | ORAL_TABLET | Freq: Three times a day (TID) | ORAL | 1 refills | Status: DC

## 2021-01-08 NOTE — Progress Notes (Signed)
Pt home health was picked up by [facility] home health,

## 2021-01-08 NOTE — Patient Instructions (Addendum)
Start Carbidopa Levodopa as follows: Take 1/2 tablet three times daily, at least 30 minutes before meals (approximately 6am/10am/2-3pm), for one week Then take 1/2 tablet in the morning, 1/2 tablet in the afternoon, 1 tablet in the evening, at least 30 minutes before meals, for one week Then take 1/2 tablet in the morning, 1 tablet in the afternoon, 1 tablet in the evening, at least 30 minutes before meals, for one week Then take 1 tablet three times daily at 6am/10am/2-3pm, at least 30 minutes before meals   As a reminder, carbidopa/levodopa can be taken at the same time as a carbohydrate, but we like to have you take your pill either 30 minutes before a protein source or 1 hour after as protein can interfere with carbidopa/levodopa absorption.  We will send home PT to your house.

## 2021-01-12 ENCOUNTER — Telehealth: Payer: Self-pay | Admitting: Neurology

## 2021-01-12 NOTE — Telephone Encounter (Signed)
Patient's daughter [name] called requesting a call back. 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

## 2021-01-12 NOTE — Telephone Encounter (Signed)
Called patients daughter and explained Dr Arturo Morton explanation of her mothers diagnosis. Daughter was happy with this answer and had no other questions at this time

## 2021-01-13 ENCOUNTER — Telehealth: Payer: Self-pay | Admitting: Neurology

## 2021-01-13 DIAGNOSIS — E785 Hyperlipidemia, unspecified: Secondary | ICD-10-CM | POA: Diagnosis not present

## 2021-01-13 DIAGNOSIS — G8929 Other chronic pain: Secondary | ICD-10-CM | POA: Diagnosis not present

## 2021-01-13 DIAGNOSIS — I119 Hypertensive heart disease without heart failure: Secondary | ICD-10-CM | POA: Diagnosis not present

## 2021-01-13 DIAGNOSIS — F985 Adult onset fluency disorder: Secondary | ICD-10-CM | POA: Diagnosis not present

## 2021-01-13 DIAGNOSIS — E669 Obesity, unspecified: Secondary | ICD-10-CM | POA: Diagnosis not present

## 2021-01-13 DIAGNOSIS — E119 Type 2 diabetes mellitus without complications: Secondary | ICD-10-CM | POA: Diagnosis not present

## 2021-01-13 DIAGNOSIS — Z6834 Body mass index (BMI) 34.0-34.9, adult: Secondary | ICD-10-CM | POA: Diagnosis not present

## 2021-01-13 DIAGNOSIS — I088 Other rheumatic multiple valve diseases: Secondary | ICD-10-CM | POA: Diagnosis not present

## 2021-01-13 DIAGNOSIS — D259 Leiomyoma of uterus, unspecified: Secondary | ICD-10-CM | POA: Diagnosis not present

## 2021-01-13 NOTE — Telephone Encounter (Signed)
Spoke with pt daughter Advised her that per Dr Tat that the chest pain is not a side effect of the carbidopa that she needs to go to the ER for evaluation , Pt daughter verbalized understanding,

## 2021-01-13 NOTE — Telephone Encounter (Signed)
Patient's daughter Beau Fanny called and said her mom has been experiencing chest pain on left side since starting a new medication.   Call transferred to [facility].

## 2021-01-15 ENCOUNTER — Ambulatory Visit: Payer: Medicare HMO

## 2021-01-15 DIAGNOSIS — I119 Hypertensive heart disease without heart failure: Secondary | ICD-10-CM | POA: Diagnosis not present

## 2021-01-15 DIAGNOSIS — E785 Hyperlipidemia, unspecified: Secondary | ICD-10-CM | POA: Diagnosis not present

## 2021-01-15 DIAGNOSIS — D259 Leiomyoma of uterus, unspecified: Secondary | ICD-10-CM | POA: Diagnosis not present

## 2021-01-15 DIAGNOSIS — E119 Type 2 diabetes mellitus without complications: Secondary | ICD-10-CM | POA: Diagnosis not present

## 2021-01-15 DIAGNOSIS — I088 Other rheumatic multiple valve diseases: Secondary | ICD-10-CM | POA: Diagnosis not present

## 2021-01-15 DIAGNOSIS — G8929 Other chronic pain: Secondary | ICD-10-CM | POA: Diagnosis not present

## 2021-01-15 DIAGNOSIS — F985 Adult onset fluency disorder: Secondary | ICD-10-CM | POA: Diagnosis not present

## 2021-01-15 DIAGNOSIS — Z6834 Body mass index (BMI) 34.0-34.9, adult: Secondary | ICD-10-CM | POA: Diagnosis not present

## 2021-01-15 DIAGNOSIS — E669 Obesity, unspecified: Secondary | ICD-10-CM | POA: Diagnosis not present

## 2021-01-16 DIAGNOSIS — I088 Other rheumatic multiple valve diseases: Secondary | ICD-10-CM | POA: Diagnosis not present

## 2021-01-16 DIAGNOSIS — D259 Leiomyoma of uterus, unspecified: Secondary | ICD-10-CM | POA: Diagnosis not present

## 2021-01-16 DIAGNOSIS — E785 Hyperlipidemia, unspecified: Secondary | ICD-10-CM | POA: Diagnosis not present

## 2021-01-16 DIAGNOSIS — E119 Type 2 diabetes mellitus without complications: Secondary | ICD-10-CM | POA: Diagnosis not present

## 2021-01-16 DIAGNOSIS — G8929 Other chronic pain: Secondary | ICD-10-CM | POA: Diagnosis not present

## 2021-01-16 DIAGNOSIS — I119 Hypertensive heart disease without heart failure: Secondary | ICD-10-CM | POA: Diagnosis not present

## 2021-01-16 DIAGNOSIS — F985 Adult onset fluency disorder: Secondary | ICD-10-CM | POA: Diagnosis not present

## 2021-01-16 DIAGNOSIS — E669 Obesity, unspecified: Secondary | ICD-10-CM | POA: Diagnosis not present

## 2021-01-16 DIAGNOSIS — Z6834 Body mass index (BMI) 34.0-34.9, adult: Secondary | ICD-10-CM | POA: Diagnosis not present

## 2021-01-19 DIAGNOSIS — Z6834 Body mass index (BMI) 34.0-34.9, adult: Secondary | ICD-10-CM | POA: Diagnosis not present

## 2021-01-19 DIAGNOSIS — E785 Hyperlipidemia, unspecified: Secondary | ICD-10-CM | POA: Diagnosis not present

## 2021-01-19 DIAGNOSIS — E669 Obesity, unspecified: Secondary | ICD-10-CM | POA: Diagnosis not present

## 2021-01-19 DIAGNOSIS — F985 Adult onset fluency disorder: Secondary | ICD-10-CM | POA: Diagnosis not present

## 2021-01-19 DIAGNOSIS — G8929 Other chronic pain: Secondary | ICD-10-CM | POA: Diagnosis not present

## 2021-01-19 DIAGNOSIS — I119 Hypertensive heart disease without heart failure: Secondary | ICD-10-CM | POA: Diagnosis not present

## 2021-01-19 DIAGNOSIS — D259 Leiomyoma of uterus, unspecified: Secondary | ICD-10-CM | POA: Diagnosis not present

## 2021-01-19 DIAGNOSIS — I088 Other rheumatic multiple valve diseases: Secondary | ICD-10-CM | POA: Diagnosis not present

## 2021-01-19 DIAGNOSIS — E119 Type 2 diabetes mellitus without complications: Secondary | ICD-10-CM | POA: Diagnosis not present

## 2021-01-20 DIAGNOSIS — I119 Hypertensive heart disease without heart failure: Secondary | ICD-10-CM | POA: Diagnosis not present

## 2021-01-20 DIAGNOSIS — E119 Type 2 diabetes mellitus without complications: Secondary | ICD-10-CM | POA: Diagnosis not present

## 2021-01-20 DIAGNOSIS — E785 Hyperlipidemia, unspecified: Secondary | ICD-10-CM | POA: Diagnosis not present

## 2021-01-20 DIAGNOSIS — E669 Obesity, unspecified: Secondary | ICD-10-CM | POA: Diagnosis not present

## 2021-01-20 DIAGNOSIS — G8929 Other chronic pain: Secondary | ICD-10-CM | POA: Diagnosis not present

## 2021-01-20 DIAGNOSIS — Z6834 Body mass index (BMI) 34.0-34.9, adult: Secondary | ICD-10-CM | POA: Diagnosis not present

## 2021-01-20 DIAGNOSIS — I088 Other rheumatic multiple valve diseases: Secondary | ICD-10-CM | POA: Diagnosis not present

## 2021-01-20 DIAGNOSIS — D259 Leiomyoma of uterus, unspecified: Secondary | ICD-10-CM | POA: Diagnosis not present

## 2021-01-20 DIAGNOSIS — F985 Adult onset fluency disorder: Secondary | ICD-10-CM | POA: Diagnosis not present

## 2021-01-21 DIAGNOSIS — E785 Hyperlipidemia, unspecified: Secondary | ICD-10-CM | POA: Diagnosis not present

## 2021-01-21 DIAGNOSIS — D259 Leiomyoma of uterus, unspecified: Secondary | ICD-10-CM | POA: Diagnosis not present

## 2021-01-21 DIAGNOSIS — E669 Obesity, unspecified: Secondary | ICD-10-CM | POA: Diagnosis not present

## 2021-01-21 DIAGNOSIS — E119 Type 2 diabetes mellitus without complications: Secondary | ICD-10-CM | POA: Diagnosis not present

## 2021-01-21 DIAGNOSIS — I119 Hypertensive heart disease without heart failure: Secondary | ICD-10-CM | POA: Diagnosis not present

## 2021-01-21 DIAGNOSIS — F985 Adult onset fluency disorder: Secondary | ICD-10-CM | POA: Diagnosis not present

## 2021-01-21 DIAGNOSIS — G8929 Other chronic pain: Secondary | ICD-10-CM | POA: Diagnosis not present

## 2021-01-21 DIAGNOSIS — Z6834 Body mass index (BMI) 34.0-34.9, adult: Secondary | ICD-10-CM | POA: Diagnosis not present

## 2021-01-21 DIAGNOSIS — I088 Other rheumatic multiple valve diseases: Secondary | ICD-10-CM | POA: Diagnosis not present

## 2021-01-22 DIAGNOSIS — E669 Obesity, unspecified: Secondary | ICD-10-CM | POA: Diagnosis not present

## 2021-01-22 DIAGNOSIS — E119 Type 2 diabetes mellitus without complications: Secondary | ICD-10-CM | POA: Diagnosis not present

## 2021-01-22 DIAGNOSIS — Z6834 Body mass index (BMI) 34.0-34.9, adult: Secondary | ICD-10-CM | POA: Diagnosis not present

## 2021-01-22 DIAGNOSIS — F985 Adult onset fluency disorder: Secondary | ICD-10-CM | POA: Diagnosis not present

## 2021-01-22 DIAGNOSIS — I119 Hypertensive heart disease without heart failure: Secondary | ICD-10-CM | POA: Diagnosis not present

## 2021-01-22 DIAGNOSIS — E785 Hyperlipidemia, unspecified: Secondary | ICD-10-CM | POA: Diagnosis not present

## 2021-01-22 DIAGNOSIS — G8929 Other chronic pain: Secondary | ICD-10-CM | POA: Diagnosis not present

## 2021-01-22 DIAGNOSIS — I088 Other rheumatic multiple valve diseases: Secondary | ICD-10-CM | POA: Diagnosis not present

## 2021-01-22 DIAGNOSIS — D259 Leiomyoma of uterus, unspecified: Secondary | ICD-10-CM | POA: Diagnosis not present

## 2021-01-27 ENCOUNTER — Other Ambulatory Visit: Payer: Self-pay

## 2021-01-27 ENCOUNTER — Ambulatory Visit: Payer: Medicare HMO | Attending: Internal Medicine | Admitting: Internal Medicine

## 2021-01-27 ENCOUNTER — Encounter: Payer: Self-pay | Admitting: Internal Medicine

## 2021-01-27 VITALS — BP 115/79 | HR 80 | Resp 16 | Ht 63.0 in | Wt 192.4 lb

## 2021-01-27 DIAGNOSIS — Z6834 Body mass index (BMI) 34.0-34.9, adult: Secondary | ICD-10-CM

## 2021-01-27 DIAGNOSIS — E782 Mixed hyperlipidemia: Secondary | ICD-10-CM | POA: Diagnosis not present

## 2021-01-27 DIAGNOSIS — I10 Essential (primary) hypertension: Secondary | ICD-10-CM | POA: Diagnosis not present

## 2021-01-27 DIAGNOSIS — E1169 Type 2 diabetes mellitus with other specified complication: Secondary | ICD-10-CM | POA: Diagnosis not present

## 2021-01-27 DIAGNOSIS — G8929 Other chronic pain: Secondary | ICD-10-CM | POA: Diagnosis not present

## 2021-01-27 DIAGNOSIS — F985 Adult onset fluency disorder: Secondary | ICD-10-CM | POA: Diagnosis not present

## 2021-01-27 DIAGNOSIS — E669 Obesity, unspecified: Secondary | ICD-10-CM

## 2021-01-27 DIAGNOSIS — E119 Type 2 diabetes mellitus without complications: Secondary | ICD-10-CM | POA: Diagnosis not present

## 2021-01-27 DIAGNOSIS — D259 Leiomyoma of uterus, unspecified: Secondary | ICD-10-CM | POA: Diagnosis not present

## 2021-01-27 DIAGNOSIS — I119 Hypertensive heart disease without heart failure: Secondary | ICD-10-CM | POA: Diagnosis not present

## 2021-01-27 DIAGNOSIS — G231 Progressive supranuclear ophthalmoplegia [Steele-Richardson-Olszewski]: Secondary | ICD-10-CM | POA: Insufficient documentation

## 2021-01-27 DIAGNOSIS — I088 Other rheumatic multiple valve diseases: Secondary | ICD-10-CM | POA: Diagnosis not present

## 2021-01-27 DIAGNOSIS — E785 Hyperlipidemia, unspecified: Secondary | ICD-10-CM | POA: Diagnosis not present

## 2021-01-27 LAB — POCT GLYCOSYLATED HEMOGLOBIN (HGB A1C): HbA1c, POC (controlled diabetic range): 8.6 % — AB (ref 0.0–7.0)

## 2021-01-27 LAB — GLUCOSE, POCT (MANUAL RESULT ENTRY): POC Glucose: 190 mg/dl — AB (ref 70–99)

## 2021-01-27 MED ORDER — GLIPIZIDE ER 5 MG PO TB24: 5.0000 mg | ORAL_TABLET | Freq: Every morning | ORAL | 5 refills | Status: DC

## 2021-01-27 NOTE — Progress Notes (Signed)
Patient ID: Kristi Campos, female    DOB: Jul 17, 1950  MRN: [id number]  CC: Establish Care and Diabetes   Subjective: Kristi Campos is a 70 y.o. female who presents for chronic disease management.  Previous PCP was Dr. Chapman Fitch who is no longer with the practice.  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 Katy Fitch).   Since last visit with [place], it looks like she was seen another primary care doctor named Dr. Manuella Ghazi who is not within the [facility] health system. Pt did not bring meds with her.  She tells me she saw Dr. Manuella Ghazi 3 wks ago and will be seeing him again in 3 mths.    DIABETES TYPE 2 Last A1C:   Results for orders placed or performed in visit on 01/27/21  POCT glucose (manual entry)  Result Value Ref Range   POC Glucose 190 (A) 70 - 99 mg/dl  POCT glycosylated hemoglobin (Hb A1C)  Result Value Ref Range   Hemoglobin A1C     HbA1c POC (<> result, manual entry)     HbA1c, POC (prediabetic range)     HbA1c, POC (controlled diabetic range) 8.6 (A) 0.0 - 7.0 %    Med Adherence:  [x]  Yes reports being on Glipizide and Jardiance 25 mg. Marcelline Deist Jardiance is not on her current list.] Medication side effects:  []  Yes    [x]  No Home Monitoring?  [x]  Yes - every morning    []  No Home glucose results range: 135-151.  Highest 197 Diet Adherence: [x]  Yes    []  No Exercise: [x]  Yes  - doing home P.T 2 x a wk.  Lives alone.  Does not work   []  No Hypoglycemic episodes?: []  Yes    [x]  No Numbness of the feet? []  Yes    [x]  No Retinopathy hx? []  Yes    []  No Last eye exam: no blurred vision.  Last eye exam was 09/2020 by Dr. Schuyler Amor per her report.  Has glucoma LT eye Comments:  HYPERTENSION Currently taking: see medication list.  According to her med list she is on Cozaar/hydrochlorothiazide. Med Adherence: [x]  Yes    []  No Medication side effects: []  Yes    [x]  No Adherence with salt  restriction: [x]  Yes    []  No Home Monitoring?: [x]  Yes    []  No Monitoring Frequency:  daily Home BP results range: this a.m was 112/70 SOB? []  Yes    [x]  No Chest Pain?: []  Yes    [x]  No Leg swelling?: []  Yes    [x]  No Headaches?: []  Yes    [x]  No Dizziness? []  Yes    [x]  No Comments:   HL: reports taking Lipitor listed as 10 mg daily.  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  Recently started on Sinemet.  HM:  reports having had flu and PCV vaccines through Dr. Manuella Ghazi. Patient Active Problem List   Diagnosis Date Noted   AKI (acute kidney injury) ([place]) 10/18/2020   Chest pain 10/17/2020   Primary osteoarthritis, right wrist 02/28/2020   Peripheral arterial disease ([place]) 10/03/2019   Essential hypertension 10/03/2019   Hyperlipidemia 10/03/2019   Diabetes mellitus type 2 in obese ([place]) 11/24/2018   Chronic right-sided low back pain without sciatica 10/24/2018     Current Outpatient Medications on File Prior to Visit  Medication Sig Dispense Refill  Artificial Tear Ointment (DRY EYES OP) Place 1 drop into both eyes 4 (four) times daily.     aspirin EC 81 MG tablet Take 81 mg by mouth daily.     atorvastatin (LIPITOR) 10 MG tablet Take 1 tablet (10 mg total) by mouth daily. (Patient taking differently: Take 10 mg by mouth at bedtime.) 90 tablet 1   carbidopa-levodopa (SINEMET IR) 25-100 MG tablet Take 1 tablet by mouth 3 (three) times daily. 6am/10am/2-3pm 270 tablet 1   losartan-hydrochlorothiazide (HYZAAR) 100-25 MG tablet TAKE 1 TABLET BY MOUTH DAILY. 90 tablet 3   meloxicam (MOBIC) 7.5 MG tablet Take 1 tablet (7.5 mg total) by mouth daily. (Patient taking differently: Take 7.5 mg by mouth at bedtime.) 30 tablet 1   nitroGLYCERIN (NITROSTAT) 0.4 MG SL tablet Place 1 tablet (0.4 mg total) under the tongue every 5 (five) minutes as needed for chest pain. 20 tablet 0   pantoprazole (PROTONIX) 40 MG tablet Take 1 tablet (40 mg total) by mouth  daily at 12 noon. 30 tablet 0   tiZANidine (ZANAFLEX) 4 MG tablet Take 4 mg by mouth 3 (three) times daily as needed.     [DISCONTINUED] simvastatin (ZOCOR) 10 MG tablet Take 10 mg by mouth at bedtime. (Patient not taking: Reported on 12/06/2019)     No current facility-administered medications on file prior to visit.    No Known Allergies  Social History   Socioeconomic History   Marital status: Widowed    Spouse name: Not on file   Number of children: 4   Years of education: Not on file   Highest education level: Not on file  Occupational History    Employer: other   Occupation: retired    Comment: weave for living  Tobacco Use   Smoking status: Never   Smokeless tobacco: Never  Vaping Use   Vaping Use: Never used  Substance and Sexual Activity   Alcohol use: No    Alcohol/week: 0.0 standard drinks   Drug use: No   Sexual activity: Not Currently    Partners: Male    Birth control/protection: Post-menopausal  Other Topics Concern   Not on file  Social History Narrative   Right handed    Lives at home alone    Social Determinants of Health   Financial Resource Strain: Not on file  Food Insecurity: Not on file  Transportation Needs: Not on file  Physical Activity: Not on file  Stress: Not on file  Social Connections: Not on file  Intimate Partner Violence: Not on file    Family History  Problem Relation Age of Onset   Bipolar disorder Mother    Hypertension Sister    Hypertension Sister    Hypertension Sister    Hypertension Sister    Hypertension Sister    Hypertension Sister    Hypertension Daughter    Diabetes Son    Prostate cancer Son     Past Surgical History:  Procedure Laterality Date   I & D EXTREMITY Right 08/09/2013   Procedure: MINOR IRRIGATION AND DEBRIDEMENT EXTREMITY;  Surgeon: Cammie Sickle, MD;  Location: [place];  Service: Orthopedics;  Laterality: Right;  long       wound class 4    ROS: Review of  Systems Negative except as stated above  PHYSICAL EXAM: BP 115/79   Pulse 80   Resp 16   Ht 5\' 3"  (1.6 m)   Wt 192 lb 6.4 oz (87.3 kg)  SpO2 98%   BMI 34.08 kg/m   Wt Readings from Last 3 Encounters:  01/27/21 192 lb 6.4 oz (87.3 kg)  01/08/21 189 lb (85.7 kg)  10/27/20 190 lb 12.8 oz (86.5 kg)    Physical Exam  General appearance - alert, well appearing, and in no distress Mental status -patient with flat affect.  She mumbles when she talks.  She displays some issues with memory Mouth - mucous membranes moist, pharynx normal without lesions Neck - supple, no significant adenopathy Chest - clear to auscultation, no wheezes, rales or rhonchi, symmetric air entry Heart - normal rate, regular rhythm, normal S1, S2, no murmurs, rubs, clicks or gallops Musculoskeletal -patient ambulates with a cane.  Gait is slowed and wide-based Extremities -no lower extremity edema.   CMP Latest Ref Rng & Units 10/18/2020 10/17/2020 12/06/2019  Glucose 70 - 99 mg/dL 171(H) 229(H) 130(H)  BUN 8 - 23 mg/dL 16 16 16   Creatinine 0.44 - 1.00 mg/dL 0.93 1.33(H) 0.91  Sodium 135 - 145 mmol/L 138 138 143  Potassium 3.5 - 5.1 mmol/L 3.5 3.8 4.0  Chloride 98 - 111 mmol/L 106 104 104  CO2 22 - 32 mmol/L 24 24 22   Calcium 8.9 - 10.3 mg/dL 9.2 9.7 10.8(H)  Total Protein 6.5 - 8.1 g/dL 6.8 7.8 7.7  Total Bilirubin 0.3 - 1.2 mg/dL 0.5 0.9 0.4  Alkaline Phos 38 - 126 U/L 56 61 79  AST 15 - 41 U/L 18 19 16   ALT 0 - 44 U/L 13 16 18    Lipid Panel     Component Value Date/Time   CHOL 177 12/06/2019 1035   TRIG 107 12/06/2019 1035   HDL 53 12/06/2019 1035   CHOLHDL 3.3 12/06/2019 1035   LDLCALC 105 (H) 12/06/2019 1035    CBC    Component Value Date/Time   WBC 6.5 10/18/2020 0544   RBC 4.13 10/18/2020 0544   HGB 12.7 10/18/2020 0544   HCT 37.9 10/18/2020 0544   PLT 239 10/18/2020 0544   MCV 91.8 10/18/2020 0544   MCH 30.8 10/18/2020 0544   MCHC 33.5 10/18/2020 0544   RDW 14.7 10/18/2020 0544    LYMPHSABS 2.5 10/17/2020 1432   MONOABS 0.5 10/17/2020 1432   EOSABS 0.2 10/17/2020 1432   BASOSABS 0.0 10/17/2020 1432    ASSESSMENT AND PLAN:  1. Diabetes mellitus type 2 in obese (HCC) A1c not at goal. Currently on glipizide XL 2.5 mg daily.  I have increased it to 5 mg.  She will continue Jardiance 25 mg which she states she is taking but currently not on med list.  I have requested that she bring all of her medications with her on next visit for medication reconciliation. Discussed and encourage healthy eating habits.  Looks like she is a bit limited with physical activity due to gait disturbance - POCT glucose (manual entry) - POCT glycosylated hemoglobin (Hb A1C) - Lipid panel - Microalbumin / creatinine urine ratio - glipiZIDE (GLUCOTROL XL) 5 MG 24 hr tablet; Take 1 tablet (5 mg total) by mouth every morning.  Dispense: 30 tablet; Refill: 5  2. Essential hypertension At goal.  Continue Cozaar/HCTZ  3. Mixed hyperlipidemia Continue atorvastatin.  4. Progressive supranuclear palsy ([place]) Recently diagnosed and followed by neurologist.   Patient was given the opportunity to ask questions.  Patient verbalized understanding of the plan and was able to repeat key elements of the plan.   Orders Placed This Encounter  Procedures   Lipid panel  Microalbumin / creatinine urine ratio   POCT glucose (manual entry)   POCT glycosylated hemoglobin (Hb A1C)     Requested Prescriptions   Signed Prescriptions Disp Refills   glipiZIDE (GLUCOTROL XL) 5 MG 24 hr tablet 30 tablet 5    Sig: Take 1 tablet (5 mg total) by mouth every morning.    Return in about 6 weeks (around 03/10/2021), or for AWV, for Please sign release to get records from Dr. Keith Rake. , .  Karle Plumber, MD, FACP

## 2021-01-27 NOTE — Patient Instructions (Signed)
We have increased the glipizide from 2.5 mg daily to 5 mg daily.  Please bring your medications with you on your next visit.

## 2021-01-28 ENCOUNTER — Telehealth: Payer: Self-pay

## 2021-01-28 DIAGNOSIS — E119 Type 2 diabetes mellitus without complications: Secondary | ICD-10-CM | POA: Diagnosis not present

## 2021-01-28 DIAGNOSIS — Z6834 Body mass index (BMI) 34.0-34.9, adult: Secondary | ICD-10-CM | POA: Diagnosis not present

## 2021-01-28 DIAGNOSIS — I088 Other rheumatic multiple valve diseases: Secondary | ICD-10-CM | POA: Diagnosis not present

## 2021-01-28 DIAGNOSIS — E785 Hyperlipidemia, unspecified: Secondary | ICD-10-CM | POA: Diagnosis not present

## 2021-01-28 DIAGNOSIS — G8929 Other chronic pain: Secondary | ICD-10-CM | POA: Diagnosis not present

## 2021-01-28 DIAGNOSIS — F985 Adult onset fluency disorder: Secondary | ICD-10-CM | POA: Diagnosis not present

## 2021-01-28 DIAGNOSIS — D259 Leiomyoma of uterus, unspecified: Secondary | ICD-10-CM | POA: Diagnosis not present

## 2021-01-28 DIAGNOSIS — I119 Hypertensive heart disease without heart failure: Secondary | ICD-10-CM | POA: Diagnosis not present

## 2021-01-28 DIAGNOSIS — E669 Obesity, unspecified: Secondary | ICD-10-CM | POA: Diagnosis not present

## 2021-01-28 LAB — LIPID PANEL
Chol/HDL Ratio: 2.8 ratio (ref 0.0–4.4)
Cholesterol, Total: 154 mg/dL (ref 100–199)
HDL: 56 mg/dL (ref >39)
LDL Chol Calc (NIH): 74 mg/dL (ref 0–99)
Triglycerides: 139 mg/dL (ref 0–149)
VLDL Cholesterol Cal: 24 mg/dL (ref 5–40)

## 2021-01-28 LAB — MICROALBUMIN / CREATININE URINE RATIO
Creatinine, Urine: 49.3 mg/dL
Microalb/Creat Ratio: <6 mg/g creat (ref 0–29)
Microalbumin, Urine: <3 ug/mL

## 2021-01-28 NOTE — Telephone Encounter (Signed)
Contacted pt to go over lab results pt is aware and doesn't have any questions or concerns 

## 2021-01-29 ENCOUNTER — Ambulatory Visit
Admission: RE | Admit: 2021-01-29 | Discharge: 2021-01-29 | Disposition: A | Discharge status: Home / Self Care | Payer: Medicare HMO | Source: Ambulatory Visit | Attending: Student | Admitting: Student

## 2021-01-29 DIAGNOSIS — Z1231 Encounter for screening mammogram for malignant neoplasm of breast: Secondary | ICD-10-CM

## 2021-01-29 DIAGNOSIS — F985 Adult onset fluency disorder: Secondary | ICD-10-CM | POA: Diagnosis not present

## 2021-01-29 DIAGNOSIS — Z6834 Body mass index (BMI) 34.0-34.9, adult: Secondary | ICD-10-CM | POA: Diagnosis not present

## 2021-01-29 DIAGNOSIS — E669 Obesity, unspecified: Secondary | ICD-10-CM | POA: Diagnosis not present

## 2021-01-29 DIAGNOSIS — D259 Leiomyoma of uterus, unspecified: Secondary | ICD-10-CM | POA: Diagnosis not present

## 2021-01-29 DIAGNOSIS — G8929 Other chronic pain: Secondary | ICD-10-CM | POA: Diagnosis not present

## 2021-01-29 DIAGNOSIS — E785 Hyperlipidemia, unspecified: Secondary | ICD-10-CM | POA: Diagnosis not present

## 2021-01-29 DIAGNOSIS — E119 Type 2 diabetes mellitus without complications: Secondary | ICD-10-CM | POA: Diagnosis not present

## 2021-01-29 DIAGNOSIS — I088 Other rheumatic multiple valve diseases: Secondary | ICD-10-CM | POA: Diagnosis not present

## 2021-01-29 DIAGNOSIS — I119 Hypertensive heart disease without heart failure: Secondary | ICD-10-CM | POA: Diagnosis not present

## 2021-01-30 DIAGNOSIS — E669 Obesity, unspecified: Secondary | ICD-10-CM | POA: Diagnosis not present

## 2021-01-30 DIAGNOSIS — E785 Hyperlipidemia, unspecified: Secondary | ICD-10-CM | POA: Diagnosis not present

## 2021-01-30 DIAGNOSIS — E119 Type 2 diabetes mellitus without complications: Secondary | ICD-10-CM | POA: Diagnosis not present

## 2021-01-30 DIAGNOSIS — G8929 Other chronic pain: Secondary | ICD-10-CM | POA: Diagnosis not present

## 2021-01-30 DIAGNOSIS — I119 Hypertensive heart disease without heart failure: Secondary | ICD-10-CM | POA: Diagnosis not present

## 2021-01-30 DIAGNOSIS — I088 Other rheumatic multiple valve diseases: Secondary | ICD-10-CM | POA: Diagnosis not present

## 2021-01-30 DIAGNOSIS — Z6834 Body mass index (BMI) 34.0-34.9, adult: Secondary | ICD-10-CM | POA: Diagnosis not present

## 2021-01-30 DIAGNOSIS — F985 Adult onset fluency disorder: Secondary | ICD-10-CM | POA: Diagnosis not present

## 2021-01-30 DIAGNOSIS — D259 Leiomyoma of uterus, unspecified: Secondary | ICD-10-CM | POA: Diagnosis not present

## 2021-02-04 DIAGNOSIS — Z6834 Body mass index (BMI) 34.0-34.9, adult: Secondary | ICD-10-CM | POA: Diagnosis not present

## 2021-02-04 DIAGNOSIS — F985 Adult onset fluency disorder: Secondary | ICD-10-CM | POA: Diagnosis not present

## 2021-02-04 DIAGNOSIS — E119 Type 2 diabetes mellitus without complications: Secondary | ICD-10-CM | POA: Diagnosis not present

## 2021-02-04 DIAGNOSIS — D259 Leiomyoma of uterus, unspecified: Secondary | ICD-10-CM | POA: Diagnosis not present

## 2021-02-04 DIAGNOSIS — I088 Other rheumatic multiple valve diseases: Secondary | ICD-10-CM | POA: Diagnosis not present

## 2021-02-04 DIAGNOSIS — E785 Hyperlipidemia, unspecified: Secondary | ICD-10-CM | POA: Diagnosis not present

## 2021-02-04 DIAGNOSIS — E669 Obesity, unspecified: Secondary | ICD-10-CM | POA: Diagnosis not present

## 2021-02-04 DIAGNOSIS — I119 Hypertensive heart disease without heart failure: Secondary | ICD-10-CM | POA: Diagnosis not present

## 2021-02-04 DIAGNOSIS — G8929 Other chronic pain: Secondary | ICD-10-CM | POA: Diagnosis not present

## 2021-02-05 DIAGNOSIS — G8929 Other chronic pain: Secondary | ICD-10-CM | POA: Diagnosis not present

## 2021-02-05 DIAGNOSIS — E669 Obesity, unspecified: Secondary | ICD-10-CM | POA: Diagnosis not present

## 2021-02-05 DIAGNOSIS — Z6834 Body mass index (BMI) 34.0-34.9, adult: Secondary | ICD-10-CM | POA: Diagnosis not present

## 2021-02-05 DIAGNOSIS — I119 Hypertensive heart disease without heart failure: Secondary | ICD-10-CM | POA: Diagnosis not present

## 2021-02-05 DIAGNOSIS — I088 Other rheumatic multiple valve diseases: Secondary | ICD-10-CM | POA: Diagnosis not present

## 2021-02-05 DIAGNOSIS — E785 Hyperlipidemia, unspecified: Secondary | ICD-10-CM | POA: Diagnosis not present

## 2021-02-05 DIAGNOSIS — E119 Type 2 diabetes mellitus without complications: Secondary | ICD-10-CM | POA: Diagnosis not present

## 2021-02-05 DIAGNOSIS — D259 Leiomyoma of uterus, unspecified: Secondary | ICD-10-CM | POA: Diagnosis not present

## 2021-02-05 DIAGNOSIS — F985 Adult onset fluency disorder: Secondary | ICD-10-CM | POA: Diagnosis not present

## 2021-02-09 DIAGNOSIS — I119 Hypertensive heart disease without heart failure: Secondary | ICD-10-CM | POA: Diagnosis not present

## 2021-02-09 DIAGNOSIS — G8929 Other chronic pain: Secondary | ICD-10-CM | POA: Diagnosis not present

## 2021-02-09 DIAGNOSIS — D259 Leiomyoma of uterus, unspecified: Secondary | ICD-10-CM | POA: Diagnosis not present

## 2021-02-09 DIAGNOSIS — E669 Obesity, unspecified: Secondary | ICD-10-CM | POA: Diagnosis not present

## 2021-02-09 DIAGNOSIS — F985 Adult onset fluency disorder: Secondary | ICD-10-CM | POA: Diagnosis not present

## 2021-02-09 DIAGNOSIS — E119 Type 2 diabetes mellitus without complications: Secondary | ICD-10-CM | POA: Diagnosis not present

## 2021-02-09 DIAGNOSIS — E785 Hyperlipidemia, unspecified: Secondary | ICD-10-CM | POA: Diagnosis not present

## 2021-02-09 DIAGNOSIS — I088 Other rheumatic multiple valve diseases: Secondary | ICD-10-CM | POA: Diagnosis not present

## 2021-02-09 DIAGNOSIS — Z6834 Body mass index (BMI) 34.0-34.9, adult: Secondary | ICD-10-CM | POA: Diagnosis not present

## 2021-02-13 DIAGNOSIS — E669 Obesity, unspecified: Secondary | ICD-10-CM | POA: Diagnosis not present

## 2021-02-13 DIAGNOSIS — D259 Leiomyoma of uterus, unspecified: Secondary | ICD-10-CM | POA: Diagnosis not present

## 2021-02-13 DIAGNOSIS — G8929 Other chronic pain: Secondary | ICD-10-CM | POA: Diagnosis not present

## 2021-02-13 DIAGNOSIS — E785 Hyperlipidemia, unspecified: Secondary | ICD-10-CM | POA: Diagnosis not present

## 2021-02-13 DIAGNOSIS — F985 Adult onset fluency disorder: Secondary | ICD-10-CM | POA: Diagnosis not present

## 2021-02-13 DIAGNOSIS — Z6834 Body mass index (BMI) 34.0-34.9, adult: Secondary | ICD-10-CM | POA: Diagnosis not present

## 2021-02-13 DIAGNOSIS — I119 Hypertensive heart disease without heart failure: Secondary | ICD-10-CM | POA: Diagnosis not present

## 2021-02-13 DIAGNOSIS — I088 Other rheumatic multiple valve diseases: Secondary | ICD-10-CM | POA: Diagnosis not present

## 2021-02-13 DIAGNOSIS — E119 Type 2 diabetes mellitus without complications: Secondary | ICD-10-CM | POA: Diagnosis not present

## 2021-02-16 DIAGNOSIS — I119 Hypertensive heart disease without heart failure: Secondary | ICD-10-CM | POA: Diagnosis not present

## 2021-02-16 DIAGNOSIS — G8929 Other chronic pain: Secondary | ICD-10-CM | POA: Diagnosis not present

## 2021-02-16 DIAGNOSIS — Z6834 Body mass index (BMI) 34.0-34.9, adult: Secondary | ICD-10-CM | POA: Diagnosis not present

## 2021-02-16 DIAGNOSIS — F985 Adult onset fluency disorder: Secondary | ICD-10-CM | POA: Diagnosis not present

## 2021-02-16 DIAGNOSIS — E785 Hyperlipidemia, unspecified: Secondary | ICD-10-CM | POA: Diagnosis not present

## 2021-02-16 DIAGNOSIS — I088 Other rheumatic multiple valve diseases: Secondary | ICD-10-CM | POA: Diagnosis not present

## 2021-02-16 DIAGNOSIS — E669 Obesity, unspecified: Secondary | ICD-10-CM | POA: Diagnosis not present

## 2021-02-16 DIAGNOSIS — D259 Leiomyoma of uterus, unspecified: Secondary | ICD-10-CM | POA: Diagnosis not present

## 2021-02-16 DIAGNOSIS — E119 Type 2 diabetes mellitus without complications: Secondary | ICD-10-CM | POA: Diagnosis not present

## 2021-02-17 DIAGNOSIS — E669 Obesity, unspecified: Secondary | ICD-10-CM | POA: Diagnosis not present

## 2021-02-17 DIAGNOSIS — I088 Other rheumatic multiple valve diseases: Secondary | ICD-10-CM | POA: Diagnosis not present

## 2021-02-17 DIAGNOSIS — G8929 Other chronic pain: Secondary | ICD-10-CM | POA: Diagnosis not present

## 2021-02-17 DIAGNOSIS — F985 Adult onset fluency disorder: Secondary | ICD-10-CM | POA: Diagnosis not present

## 2021-02-17 DIAGNOSIS — D259 Leiomyoma of uterus, unspecified: Secondary | ICD-10-CM | POA: Diagnosis not present

## 2021-02-17 DIAGNOSIS — E119 Type 2 diabetes mellitus without complications: Secondary | ICD-10-CM | POA: Diagnosis not present

## 2021-02-17 DIAGNOSIS — I119 Hypertensive heart disease without heart failure: Secondary | ICD-10-CM | POA: Diagnosis not present

## 2021-02-17 DIAGNOSIS — Z6834 Body mass index (BMI) 34.0-34.9, adult: Secondary | ICD-10-CM | POA: Diagnosis not present

## 2021-02-17 DIAGNOSIS — E785 Hyperlipidemia, unspecified: Secondary | ICD-10-CM | POA: Diagnosis not present

## 2021-02-28 DIAGNOSIS — G20 Parkinson's disease: Secondary | ICD-10-CM | POA: Diagnosis not present

## 2021-02-28 DIAGNOSIS — D259 Leiomyoma of uterus, unspecified: Secondary | ICD-10-CM | POA: Diagnosis not present

## 2021-02-28 DIAGNOSIS — E119 Type 2 diabetes mellitus without complications: Secondary | ICD-10-CM | POA: Diagnosis not present

## 2021-02-28 DIAGNOSIS — I088 Other rheumatic multiple valve diseases: Secondary | ICD-10-CM | POA: Diagnosis not present

## 2021-02-28 DIAGNOSIS — G231 Progressive supranuclear ophthalmoplegia [Steele-Richardson-Olszewski]: Secondary | ICD-10-CM | POA: Diagnosis not present

## 2021-02-28 DIAGNOSIS — E785 Hyperlipidemia, unspecified: Secondary | ICD-10-CM | POA: Diagnosis not present

## 2021-02-28 DIAGNOSIS — I119 Hypertensive heart disease without heart failure: Secondary | ICD-10-CM | POA: Diagnosis not present

## 2021-02-28 DIAGNOSIS — F985 Adult onset fluency disorder: Secondary | ICD-10-CM | POA: Diagnosis not present

## 2021-02-28 DIAGNOSIS — G8929 Other chronic pain: Secondary | ICD-10-CM | POA: Diagnosis not present

## 2021-03-03 DIAGNOSIS — F985 Adult onset fluency disorder: Secondary | ICD-10-CM | POA: Diagnosis not present

## 2021-03-03 DIAGNOSIS — I119 Hypertensive heart disease without heart failure: Secondary | ICD-10-CM | POA: Diagnosis not present

## 2021-03-03 DIAGNOSIS — D259 Leiomyoma of uterus, unspecified: Secondary | ICD-10-CM | POA: Diagnosis not present

## 2021-03-03 DIAGNOSIS — I088 Other rheumatic multiple valve diseases: Secondary | ICD-10-CM | POA: Diagnosis not present

## 2021-03-03 DIAGNOSIS — G231 Progressive supranuclear ophthalmoplegia [Steele-Richardson-Olszewski]: Secondary | ICD-10-CM | POA: Diagnosis not present

## 2021-03-03 DIAGNOSIS — E119 Type 2 diabetes mellitus without complications: Secondary | ICD-10-CM | POA: Diagnosis not present

## 2021-03-03 DIAGNOSIS — E785 Hyperlipidemia, unspecified: Secondary | ICD-10-CM | POA: Diagnosis not present

## 2021-03-03 DIAGNOSIS — G8929 Other chronic pain: Secondary | ICD-10-CM | POA: Diagnosis not present

## 2021-03-03 DIAGNOSIS — G20 Parkinson's disease: Secondary | ICD-10-CM | POA: Diagnosis not present

## 2021-03-05 DIAGNOSIS — G20 Parkinson's disease: Secondary | ICD-10-CM | POA: Diagnosis not present

## 2021-03-05 DIAGNOSIS — I119 Hypertensive heart disease without heart failure: Secondary | ICD-10-CM | POA: Diagnosis not present

## 2021-03-05 DIAGNOSIS — G8929 Other chronic pain: Secondary | ICD-10-CM | POA: Diagnosis not present

## 2021-03-05 DIAGNOSIS — I088 Other rheumatic multiple valve diseases: Secondary | ICD-10-CM | POA: Diagnosis not present

## 2021-03-05 DIAGNOSIS — E785 Hyperlipidemia, unspecified: Secondary | ICD-10-CM | POA: Diagnosis not present

## 2021-03-05 DIAGNOSIS — D259 Leiomyoma of uterus, unspecified: Secondary | ICD-10-CM | POA: Diagnosis not present

## 2021-03-05 DIAGNOSIS — G231 Progressive supranuclear ophthalmoplegia [Steele-Richardson-Olszewski]: Secondary | ICD-10-CM | POA: Diagnosis not present

## 2021-03-05 DIAGNOSIS — E119 Type 2 diabetes mellitus without complications: Secondary | ICD-10-CM | POA: Diagnosis not present

## 2021-03-05 DIAGNOSIS — F985 Adult onset fluency disorder: Secondary | ICD-10-CM | POA: Diagnosis not present

## 2021-03-09 DIAGNOSIS — E785 Hyperlipidemia, unspecified: Secondary | ICD-10-CM | POA: Diagnosis not present

## 2021-03-09 DIAGNOSIS — F985 Adult onset fluency disorder: Secondary | ICD-10-CM | POA: Diagnosis not present

## 2021-03-09 DIAGNOSIS — G20 Parkinson's disease: Secondary | ICD-10-CM | POA: Diagnosis not present

## 2021-03-09 DIAGNOSIS — E119 Type 2 diabetes mellitus without complications: Secondary | ICD-10-CM | POA: Diagnosis not present

## 2021-03-09 DIAGNOSIS — I088 Other rheumatic multiple valve diseases: Secondary | ICD-10-CM | POA: Diagnosis not present

## 2021-03-09 DIAGNOSIS — G8929 Other chronic pain: Secondary | ICD-10-CM | POA: Diagnosis not present

## 2021-03-09 DIAGNOSIS — D259 Leiomyoma of uterus, unspecified: Secondary | ICD-10-CM | POA: Diagnosis not present

## 2021-03-09 DIAGNOSIS — I119 Hypertensive heart disease without heart failure: Secondary | ICD-10-CM | POA: Diagnosis not present

## 2021-03-09 DIAGNOSIS — G231 Progressive supranuclear ophthalmoplegia [Steele-Richardson-Olszewski]: Secondary | ICD-10-CM | POA: Diagnosis not present

## 2021-03-17 ENCOUNTER — Other Ambulatory Visit: Payer: Self-pay | Admitting: Family Medicine

## 2021-03-17 DIAGNOSIS — E2839 Other primary ovarian failure: Secondary | ICD-10-CM

## 2021-03-18 DIAGNOSIS — G231 Progressive supranuclear ophthalmoplegia [Steele-Richardson-Olszewski]: Secondary | ICD-10-CM | POA: Diagnosis not present

## 2021-03-18 DIAGNOSIS — E119 Type 2 diabetes mellitus without complications: Secondary | ICD-10-CM | POA: Diagnosis not present

## 2021-03-18 DIAGNOSIS — I088 Other rheumatic multiple valve diseases: Secondary | ICD-10-CM | POA: Diagnosis not present

## 2021-03-18 DIAGNOSIS — F985 Adult onset fluency disorder: Secondary | ICD-10-CM | POA: Diagnosis not present

## 2021-03-18 DIAGNOSIS — G20 Parkinson's disease: Secondary | ICD-10-CM | POA: Diagnosis not present

## 2021-03-18 DIAGNOSIS — E785 Hyperlipidemia, unspecified: Secondary | ICD-10-CM | POA: Diagnosis not present

## 2021-03-18 DIAGNOSIS — D259 Leiomyoma of uterus, unspecified: Secondary | ICD-10-CM | POA: Diagnosis not present

## 2021-03-18 DIAGNOSIS — I119 Hypertensive heart disease without heart failure: Secondary | ICD-10-CM | POA: Diagnosis not present

## 2021-03-18 DIAGNOSIS — G8929 Other chronic pain: Secondary | ICD-10-CM | POA: Diagnosis not present

## 2021-03-19 DIAGNOSIS — E785 Hyperlipidemia, unspecified: Secondary | ICD-10-CM | POA: Diagnosis not present

## 2021-03-19 DIAGNOSIS — G20 Parkinson's disease: Secondary | ICD-10-CM | POA: Diagnosis not present

## 2021-03-19 DIAGNOSIS — I088 Other rheumatic multiple valve diseases: Secondary | ICD-10-CM | POA: Diagnosis not present

## 2021-03-19 DIAGNOSIS — G231 Progressive supranuclear ophthalmoplegia [Steele-Richardson-Olszewski]: Secondary | ICD-10-CM | POA: Diagnosis not present

## 2021-03-19 DIAGNOSIS — E119 Type 2 diabetes mellitus without complications: Secondary | ICD-10-CM | POA: Diagnosis not present

## 2021-03-19 DIAGNOSIS — G8929 Other chronic pain: Secondary | ICD-10-CM | POA: Diagnosis not present

## 2021-03-19 DIAGNOSIS — D259 Leiomyoma of uterus, unspecified: Secondary | ICD-10-CM | POA: Diagnosis not present

## 2021-03-19 DIAGNOSIS — I119 Hypertensive heart disease without heart failure: Secondary | ICD-10-CM | POA: Diagnosis not present

## 2021-03-19 DIAGNOSIS — F985 Adult onset fluency disorder: Secondary | ICD-10-CM | POA: Diagnosis not present

## 2021-03-31 ENCOUNTER — Other Ambulatory Visit: Payer: Self-pay

## 2021-03-31 ENCOUNTER — Encounter: Payer: Self-pay | Admitting: Internal Medicine

## 2021-03-31 ENCOUNTER — Ambulatory Visit: Payer: Medicare HMO | Attending: Internal Medicine | Admitting: Internal Medicine

## 2021-03-31 VITALS — BP 112/81 | HR 86 | Resp 16 | Ht 63.0 in | Wt 187.2 lb

## 2021-03-31 DIAGNOSIS — Z0000 Encounter for general adult medical examination without abnormal findings: Secondary | ICD-10-CM | POA: Diagnosis not present

## 2021-03-31 DIAGNOSIS — Z23 Encounter for immunization: Secondary | ICD-10-CM | POA: Diagnosis not present

## 2021-03-31 DIAGNOSIS — Z7189 Other specified counseling: Secondary | ICD-10-CM

## 2021-03-31 NOTE — Patient Instructions (Signed)
Please take the packet home with you about advanced directive and look over it.  If you decide to execute a living will and or healthcare power of attorney, please bring a copy for our records on your next visit.  I will have you scheduled with our clinical pharmacist in 2 weeks to do a medication reconciliation.  Please bring all of the medication bottles with you.  We will find out from your pharmacy whether you have completed the vaccine series for shingles.  Please get the COVID-19 vaccine booster shot at your local pharmacy.

## 2021-03-31 NOTE — Progress Notes (Signed)
Subjective:    Kristi Campos is a 71 y.o. female who presents for a Welcome to Medicare exam.  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 Katy Fitch).  Daughter, Kristi Campos, is with her.   Review of Systems Patient voices no concerns at this time.  On last visit she was told to bring all of her medicines with her today for medication reconciliation.  She brought with her healthy Cozaar/HCTZ, Sinemet, pantoprazole and Jardiance.  She did not have Lipitor or glipizide.  She tells me she thinks she has pills at home.        Objective:    Today's Vitals   03/31/21 0849  BP: 112/81  Pulse: 86  Resp: 16  SpO2: 99%  Weight: 187 lb 3.2 oz (84.9 kg)  Height: 5\' 3"  (1.6 m)  Body mass index is 33.16 kg/m. General: Elderly African-American female in NAD.  She is soft-spoken and is a little forgetful. Neck: No cervical lymphadenopathy. Chest: Clear to auscultation bilaterally CVS: Regular rate rhythm Extremities: No lower extremity edema  Medications Outpatient Encounter Medications as of 03/31/2021  Medication Sig   Artificial Tear Ointment (DRY EYES OP) Place 1 drop into both eyes 4 (four) times daily.   aspirin EC 81 MG tablet Take 81 mg by mouth daily.   atorvastatin (LIPITOR) 10 MG tablet Take 1 tablet (10 mg total) by mouth daily. (Patient taking differently: Take 10 mg by mouth at bedtime.)   carbidopa-levodopa (SINEMET IR) 25-100 MG tablet Take 1 tablet by mouth 3 (three) times daily. 6am/10am/2-3pm   glipiZIDE (GLUCOTROL XL) 5 MG 24 hr tablet Take 1 tablet (5 mg total) by mouth every morning.   losartan-hydrochlorothiazide (HYZAAR) 100-25 MG tablet TAKE 1 TABLET BY MOUTH DAILY.   meloxicam (MOBIC) 7.5 MG tablet Take 1 tablet (7.5 mg total) by mouth daily. (Patient taking differently: Take 7.5 mg by mouth at bedtime.)   nitroGLYCERIN (NITROSTAT) 0.4 MG SL tablet Place 1 tablet (0.4 mg total) under the tongue  every 5 (five) minutes as needed for chest pain.   pantoprazole (PROTONIX) 40 MG tablet Take 1 tablet (40 mg total) by mouth daily at 12 noon.   tiZANidine (ZANAFLEX) 4 MG tablet Take 4 mg by mouth 3 (three) times daily as needed.   [DISCONTINUED] simvastatin (ZOCOR) 10 MG tablet Take 10 mg by mouth at bedtime. (Patient not taking: Reported on 12/06/2019)   No facility-administered encounter medications on file as of 03/31/2021.     History: Past Medical History:  Diagnosis Date   Diabetes mellitus without complication ([place])    Fibroid uterus    Hypertension    Vaginal Pap smear, abnormal    Past Surgical History:  Procedure Laterality Date   I & D EXTREMITY Right 08/09/2013   Procedure: MINOR IRRIGATION AND DEBRIDEMENT EXTREMITY;  Surgeon: Cammie Sickle, MD;  Location: [place];  Service: Orthopedics;  Laterality: Right;  long       wound class 4    Family History  Problem Relation Age of Onset   Bipolar disorder Mother    Hypertension Sister    Hypertension Sister    Hypertension Sister    Hypertension Sister    Hypertension Sister    Hypertension Sister    Hypertension Daughter    Diabetes Son    Prostate cancer Son    Social History   Occupational History  Employer: other   Occupation: retired    Comment: weave for living  Tobacco Use   Smoking status: Never   Smokeless tobacco: Never  Vaping Use   Vaping Use: Never used  Substance and Sexual Activity   Alcohol use: No    Alcohol/week: 0.0 standard drinks   Drug use: No   Sexual activity: Not Currently    Partners: Male    Birth control/protection: Post-menopausal    Tobacco Counseling No smoker  Immunizations and Health Maintenance Immunization History  Administered Date(s) Administered   Fluad Quad(high Dose 65+) 11/24/2018   Influenza,inj,Quad PF,6+ Mos 11/26/2019   Influenza-Unspecified 01/13/2021   Pneumococcal Polysaccharide-23 10/24/2018   Td 11/24/2018  Had shingles at  [facility] on [address] [facility] Maintenance Due  Topic Date Due   COVID-19 Vaccine (1) Never done   Hepatitis C Screening  Never done   Zoster Vaccines- Shingrix (1 of 2) Never done   Pneumonia Vaccine 73+ Years old (2 - PCV) 10/24/2019   FOOT EXAM  10/24/2019    Activities of Daily Living In your present state of health, do you have any difficulty performing the following activities: 03/31/2021 10/18/2020  Hearing? N N  Vision? N N  Difficulty concentrating or making decisions? N N  Walking or climbing stairs? Y N  Dressing or bathing? N N  Doing errands, shopping? N N  Preparing Food and eating ? N -  Using the Toilet? N -  In the past six months, have you accidently leaked urine? N -  Do you have problems with loss of bowel control? N -  Managing your Medications? N -  Managing your Finances? N -  Housekeeping or managing your Housekeeping? N -  Some recent data might be hidden  -has problems walking stairs due to PSP.  No recent falls.  Uses cane.  Also has a Netzel   Advanced Directives: Does Patient Have a Medical Advance Directive?: Yes Type of Advance Directive: [place] in Chart?: Yes - validated most recent copy scanned in chart (See row information) What patient actually has with her today is a financial power of attorney.  Her daughter Kristi Campos is named as her financial power of attorney.  She does not have a healthcare power of attorney or a living will.  I explained to her and her daughter what is advanced directive including living will and healthcare power of attorney.  They were given a packet which she will take home and read. Assessment:    This is a routine wellness examination for this patient .   Vision/Hearing screen Vision Screening   Right eye Left eye Both eyes  Without correction 20 20 20 20 20 20   With correction     Has prescription glasses.  Last eye exam 09/2020.  Hx of  glaucoma.  Next appt with Dr. Katy Fitch is 04/21/2021  Dietary issues and exercise activities discussed:    She feels she is doing okay with her eating habits.  Goals   None   Depression Screen PHQ 2/9 Scores 03/31/2021 01/27/2021 12/06/2019 06/28/2019  PHQ - 2 Score 2 0 0 0  PHQ- 9 Score 4 0 5 0  She denies issues with depression  Fall Risk Fall Risk  03/31/2021  Falls in the past year? 0  Number falls in past yr: 0  Injury with Fall? 0  Risk for fall due to : No Fall Risks  Follow  up -  Comment -    Cognitive Function: MMSE - Mini Mental State Exam 03/31/2021  Orientation to time 5  Orientation to Place 5  Registration 3  Attention/ Calculation 5  Recall 3  Language- name 2 objects 2  Language- repeat 1  Language- follow 3 step command 3  Language- read & follow direction 1  Write a sentence 0  Copy design 0  Total score 28        Patient Care Team: Ladell Pier, MD as PCP - General (Internal Medicine) Lorretta Harp, MD as PCP - Cardiology (Cardiology) Suella Broad, MD as Consulting Physician (Physical Medicine and Rehabilitation) Tat, Eustace Quail, DO as Consulting Physician (Neurology)     Plan:   1. Encounter for Medicare annual wellness exam Patient scheduled with our clinical pharmacist in 2 weeks for medication reconciliation.  Advised to bring all medications with her.  2. Advance directive discussed with patient See discussion above.  Advised patient/daughter that if they execute a living will or healthcare power of attorney, they should bring a copy for our records  3. Need for Streptococcus pneumoniae vaccination - PNEUMOCOCCAL CONJUGATE VACCINE 15-VALENT  4. COVID-19 vaccine series started Encouraged her to get her COVID booster shot at any outside pharmacy.   I have personally reviewed and noted the following in the patients chart:   Medical and social history Use of alcohol, tobacco or illicit drugs  Current medications and  supplements Functional ability and status Nutritional status Physical activity Advanced directives List of other physicians Hospitalizations, surgeries, and ER visits in previous 12 months Vitals Screenings to include cognitive, depression, and falls Referrals and appointments  In addition, I have reviewed and discussed with patient certain preventive protocols, quality metrics, and best practice recommendations. A written personalized care plan for preventive services as well as general preventive health recommendations were provided to patient.     Karle Plumber, MD 03/31/2021

## 2021-04-03 DIAGNOSIS — E119 Type 2 diabetes mellitus without complications: Secondary | ICD-10-CM | POA: Diagnosis not present

## 2021-04-03 DIAGNOSIS — I119 Hypertensive heart disease without heart failure: Secondary | ICD-10-CM | POA: Diagnosis not present

## 2021-04-03 DIAGNOSIS — F985 Adult onset fluency disorder: Secondary | ICD-10-CM | POA: Diagnosis not present

## 2021-04-03 DIAGNOSIS — E785 Hyperlipidemia, unspecified: Secondary | ICD-10-CM | POA: Diagnosis not present

## 2021-04-03 DIAGNOSIS — I088 Other rheumatic multiple valve diseases: Secondary | ICD-10-CM | POA: Diagnosis not present

## 2021-04-03 DIAGNOSIS — G8929 Other chronic pain: Secondary | ICD-10-CM | POA: Diagnosis not present

## 2021-04-03 DIAGNOSIS — G231 Progressive supranuclear ophthalmoplegia [Steele-Richardson-Olszewski]: Secondary | ICD-10-CM | POA: Diagnosis not present

## 2021-04-03 DIAGNOSIS — D259 Leiomyoma of uterus, unspecified: Secondary | ICD-10-CM | POA: Diagnosis not present

## 2021-04-03 DIAGNOSIS — G20 Parkinson's disease: Secondary | ICD-10-CM | POA: Diagnosis not present

## 2021-04-22 ENCOUNTER — Other Ambulatory Visit: Payer: Self-pay

## 2021-04-22 ENCOUNTER — Ambulatory Visit: Payer: Medicare HMO | Admitting: Cardiovascular Disease

## 2021-04-22 ENCOUNTER — Encounter: Payer: Self-pay | Admitting: Cardiovascular Disease

## 2021-04-22 VITALS — BP 128/84 | HR 77 | Ht 63.0 in | Wt 189.6 lb

## 2021-04-22 DIAGNOSIS — E782 Mixed hyperlipidemia: Secondary | ICD-10-CM

## 2021-04-22 DIAGNOSIS — R0789 Other chest pain: Secondary | ICD-10-CM | POA: Diagnosis not present

## 2021-04-22 DIAGNOSIS — I739 Peripheral vascular disease, unspecified: Secondary | ICD-10-CM | POA: Diagnosis not present

## 2021-04-22 DIAGNOSIS — R079 Chest pain, unspecified: Secondary | ICD-10-CM

## 2021-04-22 DIAGNOSIS — I10 Essential (primary) hypertension: Secondary | ICD-10-CM | POA: Diagnosis not present

## 2021-04-22 NOTE — Assessment & Plan Note (Signed)
Patient was initially referred to me for PAD.  I had gotten the lower extremity arterial Doppler studies on her 11/12/2019 which were entirely normal.

## 2021-04-22 NOTE — Assessment & Plan Note (Signed)
History of hyperlipidemia on statin therapy with lipid profile performed 01/27/2021 revealing total cholesterol 154, LDL of 74 and HDL 56.

## 2021-04-22 NOTE — Assessment & Plan Note (Signed)
History of atypical chest pain resulting in an admission in August of last year where her work-up at that time was unrevealing.  She did have a 2D echo which was essentially normal and a coronary calcium score performed 10/10/2019 which was 0.  She has had no recurrent symptoms.

## 2021-04-22 NOTE — Patient Instructions (Signed)
Medication Instructions:  Your physician recommends that you continue on your current medications as directed. Please refer to the Current Medication list given to you today.  *If you need a refill on your cardiac medications before your next appointment, please call your pharmacy*  Lab Work: NONE ordered at this time of appointment   If you have labs (blood work) drawn today and your tests are completely normal, you will receive your results only by: [place] (if you have MyChart) OR A paper copy in the mail If you have any lab test that is abnormal or we need to change your treatment, we will call you to review the results.  Testing/Procedures: NONE ordered at this time of appointment   Follow-Up: At [facility], you and your health needs are our priority.  As part of our continuing mission to provide you with exceptional heart care, we have created designated Provider Care Teams.  These Care Teams include your primary Cardiologist (physician) and Advanced Practice Providers (APPs -  Physician Assistants and Nurse Practitioners) who all work together to provide you with the care you need, when you need it.  We recommend signing up for the patient portal called "MyChart".  Sign up information is provided on this After Visit Summary.  MyChart is used to connect with patients for Virtual Visits (Telemedicine).  Patients are able to view lab/test results, encounter notes, upcoming appointments, etc.  Non-urgent messages can be sent to your provider as well.   To learn more about what you can do with MyChart, go to [email or web address].    Your next appointment:   As Needed   The format for your next appointment:   In Person  Provider:   Quay Burow, MD    Other Instructions

## 2021-04-22 NOTE — Progress Notes (Signed)
04/22/2021 Kristi Campos   04/11/1950  [id number]  Primary Physician Ladell Pier, MD Primary Cardiologist: Lorretta Harp MD Kristi Campos, [place]  HPI:  Kristi Campos is a 71 y.o.  moderately overweight divorced African-American female mother of 6 children, grandmother of 43 grandchildren referred by Dr. Posey Pronto for evaluation of PAD.  She is accompanied by one of her daughters Kristi Campos today.  I last saw her in the office 10/03/2019.  Her risk factors include treated hypertension, diabetes and hyperlipidemia.  There is no family history for heart disease.  She never had heart attack or stroke.  She denies shortness of breath but does get occasional atypical chest pain.  She has had lower extremity discomfort on the right side for several years which sounds somewhat atypical for claudication.  She also complains of lower extremity edema.  Since I saw her a year and a half ago for claudication I did get lower extremity Doppler studies on her 11/12/2019 which were entirely normal.  She was admitted to the hospital in August 2022 with chest pain with a negative work-up.  Her 2D echo was normal as was a coronary calcium score.  She said no recurrent symptoms.  She has since been diagnosed with a neurologic disorder similar to Parkinson's disease and is on carbidopa.   Current Meds  Medication Sig   Artificial Tear Ointment (DRY EYES OP) Place 1 drop into both eyes 4 (four) times daily.   aspirin EC 81 MG tablet Take 81 mg by mouth daily.   atorvastatin (LIPITOR) 10 MG tablet Take 1 tablet (10 mg total) by mouth daily. (Patient taking differently: Take 10 mg by mouth at bedtime.)   Blood Glucose Monitoring Suppl (ACCU-CHEK GUIDE) w/Device KIT    carbidopa-levodopa (SINEMET IR) 25-100 MG tablet Take 1 tablet by mouth 3 (three) times daily. 6am/10am/2-3pm   fluorometholone (FML) 0.1 % ophthalmic suspension SMARTSIG:In Eye(s)   glipiZIDE (GLUCOTROL XL) 5 MG 24 hr tablet Take 1 tablet (5 mg  total) by mouth every morning.   meloxicam (MOBIC) 7.5 MG tablet Take 1 tablet (7.5 mg total) by mouth daily. (Patient taking differently: Take 7.5 mg by mouth at bedtime.)   nitroGLYCERIN (NITROSTAT) 0.4 MG SL tablet Place 1 tablet (0.4 mg total) under the tongue every 5 (five) minutes as needed for chest pain.   pantoprazole (PROTONIX) 40 MG tablet Take 1 tablet (40 mg total) by mouth daily at 12 noon.   RESTASIS 0.05 % ophthalmic emulsion 1 drop 2 (two) times daily.   tiZANidine (ZANAFLEX) 4 MG tablet Take 4 mg by mouth 3 (three) times daily as needed.   [DISCONTINUED] simvastatin (ZOCOR) 10 MG tablet Take 10 mg by mouth at bedtime.     No Known Allergies  Social History   Socioeconomic History   Marital status: Widowed    Spouse name: Not on file   Number of children: 4   Years of education: Not on file   Highest education level: Not on file  Occupational History    Employer: other   Occupation: retired    Comment: we[employer] for living  Tobacco Use   Smoking status: Never   Smokeless tobacco: Never  Vaping Use   Vaping Use: Never used  Substance and Sexual Activity   Alcohol use: No    Alcohol/week: 0.0 standard drinks   Drug use: No   Sexual activity: Not Currently    Partners: Male    Birth control/protection: Post-menopausal  Other Topics  Concern   Not on file  Social History Narrative   Right handed    Lives at home alone    Social Determinants of Health   Financial Resource Strain: Not on file  Food Insecurity: Not on file  Transportation Needs: Not on file  Physical Activity: Not on file  Stress: Not on file  Social Connections: Not on file  Intimate Partner Violence: Not on file     Review of Systems: General: negative for chills, fever, night sweats or weight changes.  Cardiovascular: negative for chest pain, dyspnea on exertion, edema, orthopnea, palpitations, paroxysmal nocturnal dyspnea or shortness of breath Dermatological: negative for  rash Respiratory: negative for cough or wheezing Urologic: negative for hematuria Abdominal: negative for nausea, vomiting, diarrhea, bright red blood per rectum, melena, or hematemesis Neurologic: negative for visual changes, syncope, or dizziness All other systems reviewed and are otherwise negative except as noted above.    Blood pressure 128/84, pulse 77, height _0  (1.6 m), weight 189 lb 9.6 oz (86 kg), SpO2 99 %.  General appearance: alert and no distress Neck: no adenopathy, no carotid bruit, no JVD, supple, symmetrical, trachea midline, and thyroid not enlarged, symmetric, no tenderness/mass/nodules Lungs: clear to auscultation bilaterally Heart: regular rate and rhythm, S1, S2 normal, no murmur, click, rub or gallop Extremities: extremities normal, atraumatic, no cyanosis or edema Pulses: 2+ and symmetric Skin: Skin color, texture, turgor normal. No rashes or lesions Neurologic: Grossly normal  EKG sinus rhythm at 77 without ST or T wave changes.  Personally reviewed this EKG.  ASSESSMENT AND PLAN:   Peripheral arterial disease ([place]) Patient was initially referred to me for PAD.  I had gotten the lower extremity arterial Doppler studies on her 11/12/2019 which were entirely normal.  Essential hypertension History of essential hypertension blood pressure measured today at 128/84.  She is on losartan and hydrochlorothiazide.  Hyperlipidemia History of hyperlipidemia on statin therapy with lipid profile performed 01/27/2021 revealing total cholesterol 154, LDL of 74 and HDL 56.  Chest pain History of atypical chest pain resulting in an admission in August of last year where her work-up at that time was unrevealing.  She did have a 2D echo which was essentially normal and a coronary calcium score performed 10/10/2019 which was 0.  She has had no recurrent symptoms.     Lorretta Harp MD FACP,FACC,FAHA, [facility] 04/22/2021 9:47 AM

## 2021-04-22 NOTE — Assessment & Plan Note (Signed)
History of essential hypertension blood pressure measured today at 128/84.  She is on losartan and hydrochlorothiazide.

## 2021-04-27 ENCOUNTER — Ambulatory Visit: Payer: Medicare HMO | Attending: Internal Medicine | Admitting: Pharmacist

## 2021-04-27 DIAGNOSIS — E669 Obesity, unspecified: Secondary | ICD-10-CM | POA: Diagnosis not present

## 2021-04-27 DIAGNOSIS — E1169 Type 2 diabetes mellitus with other specified complication: Secondary | ICD-10-CM | POA: Diagnosis not present

## 2021-04-27 MED ORDER — JARDIANCE 25 MG PO TABS: 25.0000 mg | ORAL_TABLET | Freq: Every day | ORAL | 3 refills | Status: DC

## 2021-04-27 NOTE — Progress Notes (Signed)
° °  S/O:    Kristi Campos is a 71 y.o. female who presents for medication reconciliation. PMH is significant for HTN, T2DM, PAD, HLD. Patient was referred and last seen by Primary Care Provider, Dr. Wynetta Emery, on 03/31/21. At last visit, patient brought some medications but not all and Dr. Wynetta Emery wanted her to follow up with the pharmacist to bring all medications in and update her list.   Today, patient arrives in good spirits and presents with assistance of a [place] and accompanied by a family member. She brings a bag of all of her medications with her today.   Family/Social History: Never smoker. HTN in sister, daughter.   Insurance Coverage: [employer]  Do you know what each of your medicines is for? yes Do you feel that your medications are working for you? yes Have you been experiencing any side effects to the medications prescribed? no Do you ever have trouble remembering to take your medicine? no How many days in the past week did you miss taking your medicines? 0 Do you ever not take a medicine because you feel you do not need it? no Do you have any problems obtaining medications due to finances? no Do you have any problems obtaining medications due to transportation? no In the past 6 months, have you missed getting a refill or a new prescription filled on time? yes - ran out of Jardiance two weeks ago and does not have any refills remaining Do you have any physical problems such as vision loss or trouble opening bottles that keep you from taking your medicines as prescribed? no  A/P: Medication Reconciliation:  -Understanding of regimen: good  -Understanding of indications: good  -Potential of compliance: good  Medication list reviewed and updated. Patient has no known adherence challenges based on above barriers, with the exception of running out of refills of Jardiance and being out for the past 2 weeks. Sent in refills of Jardiance. She is doing good on supply of all other  medications.   Written patient instructions and updated medication list provided. Total time in face to face counseling 15 minutes.    Follow up PCP Clinic Visit in May.   Rebbeca Paul, PharmD PGY2 Ambulatory Care Pharmacy Resident 04/27/2021 10:45 AM

## 2021-06-03 NOTE — Progress Notes (Signed)
? ? ?Assessment/Plan:  ? 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 -Patient understands differences between PSP and Parkinson's disease.  Understands that there is an increased risk of falls, aspiration as subsequent morbidity and mortality from this disease compared to Parkinson's disease. ? -They understand that oftentimes levodopa does not help in this state, but she thinks helping so we will continue carbidopa/levodopa tid. ? -Discussed MBE, but she really is asymptomatic right now so we decided to hold off on that. ? -Patient does not drive.  She does live by herself, but daughter lives next door. ? -discussed with patient that needs to use Maurer instead of cane ? -we reviewed her MRI brain - she was shown her images ? ?2.  Eyelid opening apraxia ? -Not interested in Botox right now. ? ? ?Subjective:  ? ?Kristi Campos was seen today in follow up for parkinsonism, possible PSP.  My previous records were reviewed prior to todays visit as well as outside records available to me.  She was seen for first visit last time.  Daughter with her today and supplements hx.   She was referred for physical therapy.  She did want to try levodopa, knowing that it often times does not help an atypical state.  She does think that levodopa helps though.    Pt denies falls.  Pt denies lightheadedness, near syncope.  No hallucinations.  Mood has been good.  She asks about her mri brain.  She has no swallow trouble.  She does have intermittent diplopia ? ?Current prescribed movement disorder medications: ?Carbidopa/levodopa 25/100, 1 tablet 3 times per day. ? ? ? ?ALLERGIES:  No Known Allergies ? ?CURRENT MEDICATIONS:  ?Outpatient Encounter Medications as of 06/08/2021  ?Medication Sig  ? aspirin EC 81 MG tablet Take 81 mg by mouth daily.  ? atorvastatin (LIPITOR) 10 MG tablet Take 1 tablet (10 mg total) by mouth daily. (Patient taking differently: Take 10 mg by mouth at bedtime.)  ? Blood Glucose Monitoring Suppl (ACCU-CHEK GUIDE) w/Device KIT   ?  carbidopa-levodopa (SINEMET IR) 25-100 MG tablet Take 1 tablet by mouth 3 (three) times daily. 6am/10am/2-3pm  ? fluorometholone (FML) 0.1 % ophthalmic suspension Place 1 drop into both eyes 4 (four) times daily. QID for 2 weeks, then BID for 2 weeks, then stop.  ? glipiZIDE (GLUCOTROL XL) 5 MG 24 hr tablet Take 1 tablet (5 mg total) by mouth every morning.  ? JARDIANCE 25 MG TABS tablet Take 1 tablet (25 mg total) by mouth daily.  ? nitroGLYCERIN (NITROSTAT) 0.4 MG SL tablet Place 1 tablet (0.4 mg total) under the tongue every 5 (five) minutes as needed for chest pain.  ? tiZANidine (ZANAFLEX) 4 MG tablet Take 4 mg by mouth 3 (three) times daily as needed.  ? losartan-hydrochlorothiazide (HYZAAR) 100-25 MG tablet TAKE 1 TABLET BY MOUTH DAILY.  ? pantoprazole (PROTONIX) 40 MG tablet Take 1 tablet (40 mg total) by mouth daily at 12 noon.  ? [DISCONTINUED] simvastatin (ZOCOR) 10 MG tablet Take 10 mg by mouth at bedtime.  ? ?No facility-administered encounter medications on file as of 06/08/2021.  ? ? ?Objective:  ? ?PHYSICAL EXAMINATION:   ? ?VITALS:   ?Vitals:  ? 06/08/21 0741  ?BP: 119/78  ?Pulse: 80  ?SpO2: 98%  ?Weight: 184 lb 3.2 oz (83.6 kg)  ?Height: _0  (1.6 m)  ? ? ?GEN:  The patient appears stated age and is in NAD. ?HEENT:  Normocephalic, atraumatic.  The mucous membranes are moist. The superficial temporal arteries  are without ropiness or tenderness. ? ? ?Neurological examination: ? ?Orientation: The patient is alert and oriented x3.  ?Cranial nerves: There is good facial symmetry. There is facial hypomimia.  Extraocular muscles are intact.  Downgaze was just slightly decreased. No significant square wave jerks.  The visual fields are full to confrontational testing. The speech is fluent and slightly dysarthric. some stuttering.  She has trouble with the gutteral sounds.  Soft palate rises symmetrically and there is no tongue deviation. Hearing is intact to conversational tone. ?Sensation: Sensation is  intact to light and pinprick throughout (facial, trunk, extremities). Vibration is intact at the bilateral big toe. There is no extinction with double simultaneous stimulation. There is no sensory dermatomal level identified. ?Motor: Strength is 5/5 in the bilateral upper and lower extremities.   Shoulder shrug is equal and symmetric.  There is no pronator drift. ? ?  ?Movement examination: ?Tone: There is normal tone in the bilateral upper extremities.  The tone in the lower extremities is normal.  ?Abnormal movements: None ?Coordination:  There is minimal decremation with RAM's in the upper extremities, but very significant decremation in the bilateral lower extremities. ?Gait and Station: The patient has mild difficulty arising out of a deep-seated chair without the use of the hands.  She has start hesitation.  Gait is shuffling.  She walks on the toes, especially on the right.  She turns en bloc and freezes in the turn.  This is the same as last visit. ? ?I have reviewed and interpreted the following labs independently ? ?  Chemistry   ?   ?Component Value Date/Time  ? NA 138 10/18/2020 0544  ? NA 143 12/06/2019 1035  ? K 3.5 10/18/2020 0544  ? CL 106 10/18/2020 0544  ? CO2 24 10/18/2020 0544  ? BUN 16 10/18/2020 0544  ? BUN 16 12/06/2019 1035  ? CREATININE 0.93 10/18/2020 0544  ?    ?Component Value Date/Time  ? CALCIUM 9.2 10/18/2020 0544  ? ALKPHOS 56 10/18/2020 0544  ? AST 18 10/18/2020 0544  ? ALT 13 10/18/2020 0544  ? BILITOT 0.5 10/18/2020 0544  ? BILITOT 0.4 12/06/2019 1035  ?  ? ? ? ?Lab Results  ?Component Value Date  ? WBC 6.5 10/18/2020  ? HGB 12.7 10/18/2020  ? HCT 37.9 10/18/2020  ? MCV 91.8 10/18/2020  ? PLT 239 10/18/2020  ? ? ?Lab Results  ?Component Value Date  ? TSH 1.010 12/06/2019  ? ? ? ?Total time spent on today's visit was 25 minutes, including both face-to-face time and nonface-to-face time.  Time included that spent on review of records (prior notes available to me/labs/imaging if  pertinent), discussing treatment and goals, answering patient's questions and coordinating care. ? ?Cc:  Ladell Pier, MD ? ?

## 2021-06-08 ENCOUNTER — Ambulatory Visit: Payer: Medicare HMO | Admitting: Neurology

## 2021-06-08 VITALS — BP 119/78 | HR 80 | Ht 63.0 in | Wt 184.2 lb

## 2021-06-08 DIAGNOSIS — G231 Progressive supranuclear ophthalmoplegia [Steele-Richardson-Olszewski]: Secondary | ICD-10-CM | POA: Diagnosis not present

## 2021-06-08 MED ORDER — CARBIDOPA-LEVODOPA 25-100 MG PO TABS: 1.0000 | ORAL_TABLET | Freq: Three times a day (TID) | ORAL | 1 refills | Status: DC

## 2021-06-08 NOTE — Patient Instructions (Signed)
Use a Gruber at all times.  Let us know if you need a RX for that. ? ?It was good to see you today! ? ?The physicians and staff at [facility] are committed to providing excellent care. You may receive a survey requesting feedback about your experience at our office. We strive to receive "very good" responses to the survey questions. If you feel that your experience would prevent you from giving the office a "very good " response, please contact our office to try to remedy the situation. We may be reached at [phone]. Thank you for taking the time out of your busy day to complete the survey. ? ?

## 2021-06-20 ENCOUNTER — Encounter (HOSPITAL_COMMUNITY): Payer: Self-pay | Admitting: Emergency Medicine

## 2021-06-20 ENCOUNTER — Other Ambulatory Visit: Payer: Self-pay

## 2021-06-20 ENCOUNTER — Emergency Department (HOSPITAL_COMMUNITY)
Admission: EM | Admit: 2021-06-20 | Discharge: 2021-06-20 | Disposition: A | Discharge status: Home / Self Care | Payer: Medicare HMO | Attending: Emergency Medicine | Admitting: Emergency Medicine

## 2021-06-20 ENCOUNTER — Emergency Department (HOSPITAL_COMMUNITY): Payer: Medicare HMO

## 2021-06-20 DIAGNOSIS — M25551 Pain in right hip: Secondary | ICD-10-CM | POA: Diagnosis not present

## 2021-06-20 DIAGNOSIS — Z7984 Long term (current) use of oral hypoglycemic drugs: Secondary | ICD-10-CM | POA: Diagnosis not present

## 2021-06-20 DIAGNOSIS — G20 Parkinson's disease: Secondary | ICD-10-CM

## 2021-06-20 DIAGNOSIS — W1809XA Striking against other object with subsequent fall, initial encounter: Secondary | ICD-10-CM | POA: Diagnosis not present

## 2021-06-20 DIAGNOSIS — E119 Type 2 diabetes mellitus without complications: Secondary | ICD-10-CM | POA: Insufficient documentation

## 2021-06-20 DIAGNOSIS — Z79899 Other long term (current) drug therapy: Secondary | ICD-10-CM | POA: Diagnosis not present

## 2021-06-20 DIAGNOSIS — Z7982 Long term (current) use of aspirin: Secondary | ICD-10-CM | POA: Diagnosis not present

## 2021-06-20 DIAGNOSIS — S51011A Laceration without foreign body of right elbow, initial encounter: Secondary | ICD-10-CM | POA: Insufficient documentation

## 2021-06-20 DIAGNOSIS — I10 Essential (primary) hypertension: Secondary | ICD-10-CM | POA: Diagnosis not present

## 2021-06-20 DIAGNOSIS — R519 Headache, unspecified: Secondary | ICD-10-CM | POA: Diagnosis not present

## 2021-06-20 DIAGNOSIS — S59901A Unspecified injury of right elbow, initial encounter: Secondary | ICD-10-CM | POA: Diagnosis present

## 2021-06-20 DIAGNOSIS — W19XXXA Unspecified fall, initial encounter: Secondary | ICD-10-CM

## 2021-06-20 NOTE — Discharge Instructions (Signed)
Be careful with your Conley as you know your fall risk.  Today's work-up CT head and neck and x-ray of the right hip and pelvis without any bony abnormalities.  Follow-up with neurology and your regular doctor as needed. ?

## 2021-06-20 NOTE — ED Provider Notes (Addendum)
?[facility] ?Provider Note ? ? ?CSN: [id number] ?Arrival date & time: 06/20/21  0845 ? ?  ? ?History ? ?Chief Complaint  ?Patient presents with  ? Fall  ? ? ?Kristi Campos is a 71 y.o. female. ? ?Patient followed by [facility] neurology.  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  Last seen by neurology on April 10.  She is being treated with Parkinson medications because the patient thought maybe they are helping a little bit.  Patient also has had a recent MRI which was reviewed by them.  Patient is listed as a fall risk.  She is using her Runde on Thursday she was trying to transfer to use the bathroom and she fell and she hit the left side of her head.  Also has complaint of skin tear to her right elbow and some right-sided hip pain.  No other complaints. ? ?Past medical history sniffing for hypertension and diabetes. ? ? ?  ? ?Home Medications ?Prior to Admission medications   ?Medication Sig Start Date End Date Taking? Authorizing Provider  ?aspirin EC 81 MG tablet Take 81 mg by mouth daily.    [provider]  ?atorvastatin (LIPITOR) 10 MG tablet Take 1 tablet (10 mg total) by mouth daily. ?Patient taking differently: Take 10 mg by mouth at bedtime. 12/06/19   Argentina Donovan, PA-C  ?Blood Glucose Monitoring Suppl (ACCU-CHEK GUIDE) w/Device KIT  11/06/20   [provider]  ?carbidopa-levodopa (SINEMET IR) 25-100 MG tablet Take 1 tablet by mouth 3 (three) times daily. 6am/10am/2-3pm 06/08/21   Tat, Eustace Quail, DO  ?fluorometholone (FML) 0.1 % ophthalmic suspension Place 1 drop into both eyes 4 (four) times daily. QID for 2 weeks, then BID for 2 weeks, then stop. 04/21/21   [provider]  ?glipiZIDE (GLUCOTROL XL) 5 MG 24 hr tablet Take 1 tablet (5 mg total) by mouth every morning. 01/27/21   Ladell Pier, MD  ?JARDIANCE 25 MG TABS tablet Take 1 tablet (25 mg  total) by mouth daily. 04/27/21   Ladell Pier, MD  ?losartan-hydrochlorothiazide (HYZAAR) 100-25 MG tablet TAKE 1 TABLET BY MOUTH DAILY. 12/06/19 04/27/21  Argentina Donovan, PA-C  ?nitroGLYCERIN (NITROSTAT) 0.4 MG SL tablet Place 1 tablet (0.4 mg total) under the tongue every 5 (five) minutes as needed for chest pain. 10/18/20   Bonnielee Haff, MD  ?pantoprazole (PROTONIX) 40 MG tablet Take 1 tablet (40 mg total) by mouth daily at 12 noon. 10/19/20 04/27/21  Bonnielee Haff, MD  ?tiZANidine (ZANAFLEX) 4 MG tablet Take 4 mg by mouth 3 (three) times daily as needed. 09/26/20   [provider]  ?simvastatin (ZOCOR) 10 MG tablet Take 10 mg by mouth at bedtime. 01/01/19 02/28/20  [provider]  ?   ? ?Allergies    ?Patient has no known allergies.   ? ?Review of Systems   ?Review of Systems  ?Constitutional:  Negative for chills and fever.  ?HENT:  Negative for ear pain and sore throat.   ?Eyes:  Negative for pain and visual disturbance.  ?Respiratory:  Negative for cough and shortness of breath.   ?Cardiovascular:  Negative for chest pain and palpitations.  ?Gastrointestinal:  Negative for abdominal pain and vomiting.  ?Genitourinary:  Negative for dysuria and hematuria.  ?Musculoskeletal:  Negative for arthralgias, back pain and neck pain.  ?Skin:  Negative for color change and rash.  ?Neurological:  Positive for headaches. Negative for dizziness, seizures, syncope and facial asymmetry.  ?All other systems reviewed and are negative. ? ?Physical Exam ?Updated Vital Signs ?BP (!) 142/82   Pulse 65   Temp 98.3 ?F (36.8 ?C) (Oral)   Resp 19   SpO2 100%  ?Physical Exam ?Vitals and nursing note reviewed.  ?Constitutional:   ?   General: She is not in acute distress. ?   Appearance: Normal appearance. She is well-developed.  ?HENT:  ?   Head: Normocephalic.  ?   Comments: Some tenderness to palpation to the left forehead area.  But no abrasion no bleeding no significant hematoma. ?Eyes:  ?    Extraocular Movements: Extraocular movements intact.  ?   Conjunctiva/sclera: Conjunctivae normal.  ?   Pupils: Pupils are equal, round, and reactive to light.  ?Cardiovascular:  ?   Rate and Rhythm: Normal rate and regular rhythm.  ?   Heart sounds: No murmur heard. ?Pulmonary:  ?   Effort: Pulmonary effort is normal. No respiratory distress.  ?   Breath sounds: Normal breath sounds.  ?Abdominal:  ?   Palpations: Abdomen is soft.  ?   Tenderness: There is no abdominal tenderness.  ?Musculoskeletal:     ?   General: Tenderness present. No swelling.  ?   Cervical back: Normal range of motion and neck supple.  ?   Comments: No leg shortening.  But tenderness to palpation to the lateral aspect of the right hip.  Dorsalis pedis pulse distally is 1-2+ in both lower extremities.  The right elbow area has a 1 cm skin tear that is not actively bleeding.  ?Skin: ?   General: Skin is warm and dry.  ?   Capillary Refill: Capillary refill takes less than 2 seconds.  ?Neurological:  ?   General: No focal deficit present.  ?   Mental Status: She is alert. Mental status is at baseline.  ?   Cranial Nerves: No cranial nerve deficit.  ?   Motor: No weakness.  ?   Comments: Neuro exam grossly patient without any significant weakness.  Did not walk her so did not evaluate any coordination or abnormal gait issues.  Patient understands and answers questions appropriately.  No gross cranial nerve deficits.  ?Psychiatric:     ?   Mood and Affect: Mood normal.  ? ? ?ED Results / Procedures / Treatments   ?Labs ?(all labs ordered are listed, but only abnormal results are displayed) ?Labs Reviewed - No data to display ? ?EKG ?None ? ?Radiology ?CT Head Wo Contrast ? ?Result Date: 06/20/2021 ?CLINICAL DATA:  Moderate to severe head trauma.  Head pain. EXAM: CT HEAD WITHOUT CONTRAST CT CERVICAL SPINE WITHOUT CONTRAST TECHNIQUE: Multidetector CT imaging of the head and cervical spine was performed following the standard protocol without  intravenous contrast. Multiplanar CT image reconstructions of the cervical spine were also generated. RADIATION DOSE REDUCTION: This exam was performed according to the departmental dose-optimization program which includes automated exposure control, adjustment of the mA and/or kV according to patient size and/or use of iterative reconstruction technique. COMPARISON:  01/22/2017 head CT FINDINGS: CT HEAD FINDINGS Brain: No evidence of acute infarction, hemorrhage, hydrocephalus, extra-axial collection or mass lesion/mass effect. Scattered dural calcification which is nonspecific and non progressed. Vascular: No hyperdense vessel or unexpected calcification. Skull: Normal. Negative for fracture or focal lesion. Sinuses/Orbits: No visible injury CT CERVICAL SPINE FINDINGS Alignment: Normal Skull base and vertebrae: No acute fracture. No primary bone lesion or  focal pathologic process. Soft tissues and spinal canal: No prevertebral fluid or swelling. No visible canal hematoma. Disc levels:  Ordinary and mild degenerative spurring. Upper chest: No acute finding IMPRESSION: No evidence of acute intracranial or cervical spine injury. Electronically Signed   By: Jorje Guild M.D.   On: 06/20/2021 09:41  ? ?CT Cervical Spine Wo Contrast ? ?Result Date: 06/20/2021 ?CLINICAL DATA:  Moderate to severe head trauma.  Head pain. EXAM: CT HEAD WITHOUT CONTRAST CT CERVICAL SPINE WITHOUT CONTRAST TECHNIQUE: Multidetector CT imaging of the head and cervical spine was performed following the standard protocol without intravenous contrast. Multiplanar CT image reconstructions of the cervical spine were also generated. RADIATION DOSE REDUCTION: This exam was performed according to the departmental dose-optimization program which includes automated exposure control, adjustment of the mA and/or kV according to patient size and/or use of iterative reconstruction technique. COMPARISON:  01/22/2017 head CT FINDINGS: CT HEAD FINDINGS Brain:  No evidence of acute infarction, hemorrhage, hydrocephalus, extra-axial collection or mass lesion/mass effect. Scattered dural calcification which is nonspecific and non progressed. Vascular: No hyperdense vessel or unexpected

## 2021-06-20 NOTE — ED Notes (Signed)
Pt verbalizes understanding of discharge instructions. Opportunity for questions and answers were provided. Pt discharged from the ED.   ?

## 2021-06-20 NOTE — ED Triage Notes (Signed)
Pt to triage via [facility] from home. Pt's knee gave out on Thursday and she fell hitting L forehead on wall.  No blood thinners.  Denies LOC.  C/o head pain and skin tear to R elbow.  Ambulatory with [facility]. ? ?[facility] brought [facility] with pt.  [place] labeled on arrival. ?

## 2021-06-20 NOTE — ED Provider Triage Note (Signed)
Emergency Medicine Provider Triage Evaluation Note ? ?Kristi Campos , a 71 y.o. female  was evaluated in triage.  Pt complains of fall.  Patient states that she fell off the toilet 2 days ago striking her head.  Has had a persistent headache since then.  Patient denies any on blood thinners.  Patient denies any loss of consciousness, shortness of breath, chest pain, nausea or vomiting. ? ?Review of Systems  ?Positive:  ?Negative:  ? ?Physical Exam  ?BP 121/84   Pulse 94   Temp 98.3 ?F (36.8 ?C) (Oral)   Resp 20   SpO2 100%  ?Gen:   Awake, no distress   ?Resp:  Normal effort  ?MSK:   Moves extremities without difficulty  ?Other:  No focal neurodeficits on examination ? ?Medical Decision Making  ?Medically screening exam initiated at 8:50 AM.  Appropriate orders placed.  Najmah Panchal was informed that the remainder of the evaluation will be completed by another provider, this initial triage assessment does not replace that evaluation, and the importance of remaining in the ED until their evaluation is complete. ? ? ?  ?Azucena Cecil, PA-C ?06/20/21 [phone] ? ?

## 2021-06-29 ENCOUNTER — Ambulatory Visit: Payer: Medicare HMO | Admitting: Internal Medicine

## 2021-07-17 ENCOUNTER — Other Ambulatory Visit: Payer: Self-pay | Admitting: Neurology

## 2021-07-23 ENCOUNTER — Ambulatory Visit: Payer: Medicare Other | Attending: Internal Medicine | Admitting: Internal Medicine

## 2021-07-23 ENCOUNTER — Other Ambulatory Visit: Payer: Self-pay | Admitting: Internal Medicine

## 2021-07-23 ENCOUNTER — Encounter: Payer: Self-pay | Admitting: Internal Medicine

## 2021-07-23 VITALS — BP 127/82 | HR 74 | Ht 63.0 in | Wt 187.6 lb

## 2021-07-23 DIAGNOSIS — Z6833 Body mass index (BMI) 33.0-33.9, adult: Secondary | ICD-10-CM

## 2021-07-23 DIAGNOSIS — E1169 Type 2 diabetes mellitus with other specified complication: Secondary | ICD-10-CM

## 2021-07-23 DIAGNOSIS — I10 Essential (primary) hypertension: Secondary | ICD-10-CM

## 2021-07-23 DIAGNOSIS — G231 Progressive supranuclear ophthalmoplegia [Steele-Richardson-Olszewski]: Secondary | ICD-10-CM

## 2021-07-23 DIAGNOSIS — R232 Flushing: Secondary | ICD-10-CM

## 2021-07-23 DIAGNOSIS — Z9181 History of falling: Secondary | ICD-10-CM

## 2021-07-23 DIAGNOSIS — K219 Gastro-esophageal reflux disease without esophagitis: Secondary | ICD-10-CM

## 2021-07-23 DIAGNOSIS — E782 Mixed hyperlipidemia: Secondary | ICD-10-CM | POA: Diagnosis not present

## 2021-07-23 DIAGNOSIS — E669 Obesity, unspecified: Secondary | ICD-10-CM

## 2021-07-23 LAB — POCT GLYCOSYLATED HEMOGLOBIN (HGB A1C): HbA1c, POC (controlled diabetic range): 8.7 % — AB (ref 0.0–7.0)

## 2021-07-23 LAB — GLUCOSE, POCT (MANUAL RESULT ENTRY): POC Glucose: 265 mg/dl — AB (ref 70–99)

## 2021-07-23 MED ORDER — PANTOPRAZOLE SODIUM 40 MG PO TBEC: 40.0000 mg | DELAYED_RELEASE_TABLET | Freq: Every day | ORAL | 0 refills | Status: DC

## 2021-07-23 MED ORDER — METFORMIN HCL ER 500 MG PO TB24: 500.0000 mg | ORAL_TABLET | Freq: Every day | ORAL | 1 refills | Status: DC

## 2021-07-23 NOTE — Progress Notes (Signed)
Hot Flashes

## 2021-07-23 NOTE — Patient Instructions (Signed)
Your diabetes is not at goal.  The goal for morning blood sugars before meals is 90-130. Recommend healthier snacks like fruits or nuts. Try to eliminate sugary drinks from the diet. We have added low-dose of metformin extended release form 500 mg once a day.  Let me know if you develop any diarrhea.  Try setting the temperature in your house a little lower at nights to around 70.  Continue to use your Depasquale both inside and out of the house.

## 2021-07-23 NOTE — Progress Notes (Signed)
Patient ID: Kristi Campos, female    DOB: 10/16/1950  MRN: [id number]  CC: Diabetes   Subjective: Kristi Campos is a 71 y.o. female who presents for chronic disease management. 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  Daughter, Kristi Campos, is with her.   PSP:  Seen in ER 06/20/21 due to fall at home.  Did not have her Lovins with her and fell in bathroomno further falls.  CAT scan of the head and cervical spine negative for any acute findings.  Her neurologist Dr. Arbutus Leas recommends that she use a Rinke consistently instead of the cane.  Using [place] consistently since fall Independent in ADLS.  Does not drive.  Has shower bench.    DM: Results for orders placed or performed in visit on 07/23/21  POCT glucose (manual entry)  Result Value Ref Range   POC Glucose 265 (A) 70 - 99 mg/dl  POCT glycosylated hemoglobin (Hb A1C)  Result Value Ref Range   Hemoglobin A1C     HbA1c POC (<> result, manual entry)     HbA1c, POC (prediabetic range)     HbA1c, POC (controlled diabetic range) 8.7 (A) 0.0 - 7.0 %   -checks BS once a day in a.m before BF. Gives range 145-265.   -compliant with Glipizide 5 mg and Jardiance 25 mg Metformin in past caused diarrhea at dose higher then 500 mg BID -does her own cooking.  Drinks regular Ginger Ale. Snacks on Twinkes. Does not eat much fruits Not able to do much walking.  Tires easily due to PSP  HTN: compliant with Cozaar/HCTZ  Limits salt in foods No CP/SOB/LE edema/dizziness  HL: taking and tolerating Lipitor  C/o having hot flashes since age 26 yr.  Hot flashes have lessened over time but worse at nights.  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     atorvastatin (LIPITOR) 10 MG tablet Take 1 tablet (10 mg total) by mouth daily. (Patient taking differently: Take 10 mg by mouth at bedtime.) 90 tablet 1   Blood Glucose Monitoring Suppl (ACCU-CHEK GUIDE) w/Device KIT      carbidopa-levodopa (SINEMET IR) 25-100 MG tablet Take 1 tablet by mouth 3 (three) times daily. 6am/10am/2-3pm 270 tablet 1   fluorometholone (FML) 0.1 % ophthalmic suspension Place 1 drop into both eyes 4 (four) times daily. QID for 2 weeks, then BID for 2 weeks, then stop.     glipiZIDE (GLUCOTROL XL) 5 MG 24 hr tablet Take 1 tablet (5 mg total) by mouth every morning. 30 tablet 5   JARDIANCE 25 MG TABS tablet Take 1 tablet (25 mg total) by mouth daily. 30 tablet 3   nitroGLYCERIN (NITROSTAT) 0.4 MG SL tablet Place 1 tablet (0.4 mg total) under the tongue every  5 (five) minutes as needed for chest pain. 20 tablet 0   tiZANidine (ZANAFLEX) 4 MG tablet Take 4 mg by mouth 3 (three) times daily as needed.     losartan-hydrochlorothiazide (HYZAAR) 100-25 MG tablet TAKE 1 TABLET BY MOUTH DAILY. 90 tablet 3   pantoprazole (PROTONIX) 40 MG tablet Take 1 tablet (40 mg total) by mouth daily at 12 noon. 30 tablet 0   [DISCONTINUED] simvastatin (ZOCOR) 10 MG tablet Take 10 mg by mouth at bedtime.     No current facility-administered medications on file prior to visit.    No Known Allergies  Social History   Socioeconomic History   Marital status: Widowed    Spouse name: Not on file   Number of children: 4   Years of education: Not on file   Highest education  level: Not on file  Occupational History    Employer: other   Occupation: retired    Comment: weave for living  Tobacco Use   Smoking status: Never   Smokeless tobacco: Never  Vaping Use   Vaping Use: Never used  Substance and Sexual Activity   Alcohol use: No    Alcohol/week: 0.0 standard drinks   Drug use: No   Sexual activity: Not Currently    Partners: Male    Birth control/protection: Post-menopausal  Other Topics Concern   Not on file  Social History Narrative   Right handed    Lives at home alone    Social Determinants of Health   Financial Resource Strain: Not on file  Food Insecurity: Not on file  Transportation Needs: Not on file  Physical Activity: Not on file  Stress: Not on file  Social Connections: Not on file  Intimate Partner Violence: Not on file    Family History  Problem Relation Age of Onset   Bipolar disorder Mother    Hypertension Sister    Hypertension Sister    Hypertension Sister    Hypertension Sister    Hypertension Sister    Hypertension Sister    Hypertension Daughter    Diabetes Son    Prostate cancer Son     Past Surgical History:  Procedure Laterality Date   I & D EXTREMITY Right 08/09/2013   Procedure: MINOR IRRIGATION AND DEBRIDEMENT EXTREMITY;  Surgeon: Cammie Sickle, MD;  Location: [facility];  Service: Orthopedics;  Laterality: Right;  long       wound class 4    ROS: Review of Systems GI: Requests refill on pantoprazole for acid reflux. PHYSICAL EXAM: BP 127/82   Pulse 74   Ht $R'5\' 3"'aQ$  (1.6 m)   Wt 187 lb 9.6 oz (85.1 kg)   SpO2 100%   BMI 33.23 kg/m   Wt Readings from Last 3 Encounters:  07/23/21 187 lb 9.6 oz (85.1 kg)  06/08/21 184 lb 3.2 oz (83.6 kg)  04/22/21 189 lb 9.6 oz (86 kg)    Physical Exam  General appearance - alert, well appearing, and in no distress Mental status - normal mood, behavior, speech, dress, motor activity, and thought processes Neck - supple, no significant  adenopathy Chest - clear to auscultation, no wheezes, rales or rhonchi, symmetric air entry Heart - normal rate, regular rhythm, normal S1, S2, no murmurs, rubs, clicks or gallops Neurological -patient ambulates with a rolling Toren.  Gait is shuffling and slow with low foot to floor clearance. Extremities -no lower extremity edema. Diabetic Foot Exam - Simple   Simple Foot Form Diabetic Foot  exam was performed with the following findings: Yes 07/23/2021  9:27 AM  Visual Inspection No deformities, no ulcerations, no other skin breakdown bilaterally: Yes Sensation Testing Intact to touch and monofilament testing bilaterally: Yes Pulse Check Posterior Tibialis and Dorsalis pulse intact bilaterally: Yes Comments         Latest Ref Rng & Units 10/18/2020    5:44 AM 10/17/2020    2:32 PM 12/06/2019   10:35 AM  CMP  Glucose 70 - 99 mg/dL 171   229   130    BUN 8 - 23 mg/dL $Remove'16   16   16    'TuRHgJR$ Creatinine 0.44 - 1.00 mg/dL 0.93   1.33   0.91    Sodium 135 - 145 mmol/L 138   138   143    Potassium 3.5 - 5.1 mmol/L 3.5   3.8   4.0    Chloride 98 - 111 mmol/L 106   104   104    CO2 22 - 32 mmol/L $RemoveB'24   24   22    'gBaBltEn$ Calcium 8.9 - 10.3 mg/dL 9.2   9.7   10.8    Total Protein 6.5 - 8.1 g/dL 6.8   7.8   7.7    Total Bilirubin 0.3 - 1.2 mg/dL 0.5   0.9   0.4    Alkaline Phos 38 - 126 U/L 56   61   79    AST 15 - 41 U/L $Remo'18   19   16    'xwMca$ ALT 0 - 44 U/L $Remo'13   16   18     'QwsCZ$ Lipid Panel     Component Value Date/Time   CHOL 154 01/27/2021 1134   TRIG 139 01/27/2021 1134   HDL 56 01/27/2021 1134   CHOLHDL 2.8 01/27/2021 1134   LDLCALC 74 01/27/2021 1134    CBC    Component Value Date/Time   WBC 6.5 10/18/2020 0544   RBC 4.13 10/18/2020 0544   HGB 12.7 10/18/2020 0544   HCT 37.9 10/18/2020 0544   PLT 239 10/18/2020 0544   MCV 91.8 10/18/2020 0544   MCH 30.8 10/18/2020 0544   MCHC 33.5 10/18/2020 0544   RDW 14.7 10/18/2020 0544   LYMPHSABS 2.5 10/17/2020 1432   MONOABS 0.5 10/17/2020 1432    EOSABS 0.2 10/17/2020 1432   BASOSABS 0.0 10/17/2020 1432    ASSESSMENT AND PLAN: 1. Diabetes mellitus type 2 in obese [facility]) Not at goal. Dietary counseling given.  Advised to eliminate sugary drinks from the diet.  Recommend healthier snacks like fruits or nuts instead of Twinkies. Continue Glucotrol and Jardiance.  We will try her with low-dose of extended release metformin. - POCT glucose (manual entry) - POCT glycosylated hemoglobin (Hb A1C) - metFORMIN (GLUCOPHAGE-XR) 500 MG 24 hr tablet; Take 1 tablet (500 mg total) by mouth daily with breakfast.  Dispense: 90 tablet; Refill: 1 - Comprehensive metabolic panel  2. Essential hypertension Close to goal.  Continue Cozaar/HCTZ 100/25 mg daily.  3. Mixed hyperlipidemia Continue atorvastatin 10 mg daily.  4. Progressive supranuclear palsy ([place]) 5. History of recent fall Advised to continue using her Sigel consistently in the house and also when she is away from the house.  6. Hot flashes It is interesting but not unusual that she still experiences hot flashes at the age of 1. I recommend turning her thermostat to 70 at night instead of 72.  Advised to wear breathable material at night like cotton for sleep where.  I looked on up-to-date and saw that Sinemet can also cause hot flashes so this could be contributing. - TSH+T4F+T3Free  7. Gastroesophageal reflux disease without esophagitis - pantoprazole (PROTONIX) 40 MG tablet; Take 1 tablet (40 mg total) by mouth daily at 12 noon.  Dispense: 30 tablet; Refill: 0     Patient was given the opportunity to ask questions.  Patient verbalized understanding of the plan and was able to repeat key elements of the plan.   This documentation was completed using Radio producer.  Any transcriptional errors are unintentional.  Orders Placed This Encounter  Procedures   POCT glucose (manual entry)   POCT glycosylated hemoglobin (Hb A1C)     Requested Prescriptions     No prescriptions requested or ordered in this encounter    No follow-ups on file.  Karle Plumber, MD, FACP

## 2021-07-24 ENCOUNTER — Ambulatory Visit: Payer: Self-pay | Admitting: *Deleted

## 2021-07-24 ENCOUNTER — Telehealth: Payer: Self-pay

## 2021-07-24 ENCOUNTER — Encounter: Payer: Self-pay | Admitting: Internal Medicine

## 2021-07-24 DIAGNOSIS — E1169 Type 2 diabetes mellitus with other specified complication: Secondary | ICD-10-CM

## 2021-07-24 LAB — COMPREHENSIVE METABOLIC PANEL
ALT: 17 IU/L (ref 0–32)
AST: 18 IU/L (ref 0–40)
Albumin/Globulin Ratio: 1.3 (ref 1.2–2.2)
Albumin: 4.4 g/dL (ref 3.7–4.7)
Alkaline Phosphatase: 93 IU/L (ref 44–121)
BUN/Creatinine Ratio: 14 (ref 12–28)
BUN: 16 mg/dL (ref 8–27)
Bilirubin Total: 0.4 mg/dL (ref 0.0–1.2)
CO2: 22 mmol/L (ref 20–29)
Calcium: 10.3 mg/dL (ref 8.7–10.3)
Chloride: 101 mmol/L (ref 96–106)
Creatinine, Ser: 1.13 mg/dL — ABNORMAL HIGH (ref 0.57–1.00)
Globulin, Total: 3.4 g/dL (ref 1.5–4.5)
Glucose: 255 mg/dL — ABNORMAL HIGH (ref 70–99)
Potassium: 4.7 mmol/L (ref 3.5–5.2)
Sodium: 142 mmol/L (ref 134–144)
Total Protein: 7.8 g/dL (ref 6.0–8.5)
eGFR: 52 mL/min/{1.73_m2} — ABNORMAL LOW (ref >59)

## 2021-07-24 LAB — TSH+T4F+T3FREE
Free T4: 1.07 ng/dL (ref 0.82–1.77)
T3, Free: 2.9 pg/mL (ref 2.0–4.4)
TSH: 0.984 u[IU]/mL (ref 0.450–4.500)

## 2021-07-24 MED ORDER — GLIPIZIDE ER 10 MG PO TB24: 10.0000 mg | ORAL_TABLET | Freq: Every morning | ORAL | 4 refills | Status: DC

## 2021-07-24 NOTE — Telephone Encounter (Signed)
  Chief Complaint: medication side effect- Metformin Symptoms: diarrhea Frequency: today Pertinent Negatives: Patient denies   Disposition: '[]'$ ED /'[]'$ Urgent Care (no appt availability in office) / '[]'$ Appointment(In office/virtual)/ '[]'$  [facility]/ '[]'$ Home Care/ '[]'$ Refused Recommended Disposition /'[]'$ [facility]/ '[]'$  Follow-up with PCP Additional Notes: Patient states she was advised to call office if she had SE with Metformin. Patient started medication today and started diarrhea today.Would like to know if she should continue

## 2021-07-24 NOTE — Telephone Encounter (Signed)
Contacted pt to go over lab results pt is aware and doesn't have any questions or concerns 

## 2021-07-24 NOTE — Addendum Note (Signed)
Addended by: Karle Plumber B on: 07/24/2021 05:13 PM   Modules accepted: Orders

## 2021-07-24 NOTE — Progress Notes (Signed)
Let patient and daughter know that her kidney function is not at 100% and slightly decreased from when it was last checked in August of last year.  Make sure that she is drinking several glasses of water daily.  Avoid long-term use of over-the-counter anti-inflammatory pain medications like Aleve, Advil, ibuprofen, Motrin and Naprosyn as these can make kidney function worse.  Liver function normal.

## 2021-07-24 NOTE — Telephone Encounter (Signed)
Routing to PCP for review.

## 2021-07-24 NOTE — Telephone Encounter (Signed)
Reason for Disposition  [1] Caller has URGENT medicine question about med that PCP or specialist prescribed AND [2] triager unable to answer question  Answer Assessment - Initial Assessment Questions 1. NAME of MEDICATION: "What medicine are you calling about?"     Metformin- first dose this morning 2. QUESTION: "What is your question?" (e.g., double dose of medicine, side effect)     Patient states she had diarrhea today after first dose of metformin 3. PRESCRIBING HCP: "Who prescribed it?" Reason: if prescribed by specialist, call should be referred to that group.     PCP 4. SYMPTOMS: "Do you have any symptoms?"     diarrhea Patient next dose is due in the morning. Patient advised to stay hydrated and use antidiarrheal medication if needed. Will send message to provider for plan for continuing or changing medication  Protocols used: Medication Question Call-A-AH

## 2021-07-24 NOTE — Telephone Encounter (Signed)
Phone call placed to patient today.  Patient reports diarrhea with starting the metformin.  Advised to stop metformin.  I told her that we will increase the glipizide instead.  She is currently on glipizide XL 5 mg 1 tablet daily.  I told her that we will increase it to 10 mg once a day.  Advised that she should start taking 2 of the 5 mg tablet daily until she runs out.  I will send an updated prescription to her pharmacy for the 10 mg tablet.  Patient expressed understanding.

## 2021-08-12 ENCOUNTER — Other Ambulatory Visit: Payer: Self-pay

## 2021-08-12 ENCOUNTER — Telehealth: Payer: Self-pay | Admitting: Neurology

## 2021-08-12 DIAGNOSIS — G231 Progressive supranuclear ophthalmoplegia [Steele-Richardson-Olszewski]: Secondary | ICD-10-CM

## 2021-08-12 MED ORDER — CARBIDOPA-LEVODOPA 25-100 MG PO TABS: 1.0000 | ORAL_TABLET | Freq: Three times a day (TID) | ORAL | 0 refills | Status: DC

## 2021-08-12 NOTE — Telephone Encounter (Signed)
Prescription has been sent and patient was called

## 2021-08-12 NOTE — Telephone Encounter (Signed)
1. Which medications need refilled? (List name and dosage, if known) carbidopa levodopa 25-100 MG  2. Which pharmacy/location is medication to be sent to? (include street and city if local pharmacy) [facility] on [facility]   Patient only has enough medication for today.

## 2021-08-24 ENCOUNTER — Ambulatory Visit
Admission: RE | Admit: 2021-08-24 | Discharge: 2021-08-24 | Disposition: A | Discharge status: Home / Self Care | Payer: Medicare Other | Source: Ambulatory Visit | Attending: Family Medicine | Admitting: Family Medicine

## 2021-08-24 DIAGNOSIS — M85852 Other specified disorders of bone density and structure, left thigh: Secondary | ICD-10-CM | POA: Diagnosis not present

## 2021-08-24 DIAGNOSIS — Z780 Asymptomatic menopausal state: Secondary | ICD-10-CM | POA: Diagnosis not present

## 2021-08-24 DIAGNOSIS — E2839 Other primary ovarian failure: Secondary | ICD-10-CM

## 2021-08-31 ENCOUNTER — Emergency Department (HOSPITAL_COMMUNITY): Payer: Medicare Other

## 2021-08-31 ENCOUNTER — Encounter (HOSPITAL_COMMUNITY): Payer: Self-pay | Admitting: Pharmacy Technician

## 2021-08-31 ENCOUNTER — Emergency Department (HOSPITAL_COMMUNITY)
Admission: EM | Admit: 2021-08-31 | Discharge: 2021-08-31 | Disposition: A | Discharge status: Home / Self Care | Payer: Medicare Other | Attending: Emergency Medicine | Admitting: Emergency Medicine

## 2021-08-31 DIAGNOSIS — M25512 Pain in left shoulder: Secondary | ICD-10-CM | POA: Insufficient documentation

## 2021-08-31 DIAGNOSIS — W01198A Fall on same level from slipping, tripping and stumbling with subsequent striking against other object, initial encounter: Secondary | ICD-10-CM | POA: Diagnosis not present

## 2021-08-31 DIAGNOSIS — M542 Cervicalgia: Secondary | ICD-10-CM | POA: Insufficient documentation

## 2021-08-31 DIAGNOSIS — W19XXXA Unspecified fall, initial encounter: Secondary | ICD-10-CM

## 2021-08-31 DIAGNOSIS — Z7982 Long term (current) use of aspirin: Secondary | ICD-10-CM | POA: Diagnosis not present

## 2021-08-31 DIAGNOSIS — E119 Type 2 diabetes mellitus without complications: Secondary | ICD-10-CM | POA: Insufficient documentation

## 2021-08-31 DIAGNOSIS — Z7984 Long term (current) use of oral hypoglycemic drugs: Secondary | ICD-10-CM | POA: Diagnosis not present

## 2021-08-31 DIAGNOSIS — S0990XA Unspecified injury of head, initial encounter: Secondary | ICD-10-CM | POA: Diagnosis not present

## 2021-08-31 DIAGNOSIS — I10 Essential (primary) hypertension: Secondary | ICD-10-CM | POA: Diagnosis not present

## 2021-08-31 DIAGNOSIS — R519 Headache, unspecified: Secondary | ICD-10-CM | POA: Diagnosis not present

## 2021-08-31 DIAGNOSIS — Z043 Encounter for examination and observation following other accident: Secondary | ICD-10-CM | POA: Diagnosis not present

## 2021-08-31 DIAGNOSIS — M25519 Pain in unspecified shoulder: Secondary | ICD-10-CM | POA: Diagnosis not present

## 2021-08-31 DIAGNOSIS — Z743 Need for continuous supervision: Secondary | ICD-10-CM | POA: Diagnosis not present

## 2021-08-31 LAB — CBG MONITORING, ED: Glucose-Capillary: 137 mg/dL — ABNORMAL HIGH (ref 70–99)

## 2021-08-31 MED ORDER — ACETAMINOPHEN 325 MG PO TABS
650.0000 mg | ORAL_TABLET | Freq: Once | ORAL | Status: AC
  Administered 2021-08-31: 650 mg via ORAL
  Filled 2021-08-31: qty 2

## 2021-08-31 NOTE — ED Triage Notes (Signed)
Pt here via ems from home after falling backward yesterday. Now with complaints to shoulder and back. Denies LOC.

## 2021-08-31 NOTE — ED Provider Notes (Signed)
[facility] EMERGENCY DEPARTMENT Provider Note   CSN: [id number] Arrival date & time: 08/31/21  1321     History {Add pertinent medical, surgical, social history, OB history to HPI:1} Chief Complaint  Patient presents with   Kristi Campos is a 71 y.o. female with chief complaint of bodily pains following a fall yesterday afternoon.  Patient was getting out of her vehicle using her cane which she normally uses her Angerer, ended up falling backwards and hit her left shoulder and the back of her head on the pavement.  Denies LOC.  Not on anticoagulation.  Complaining of neck stiffness as well.  Denies feeling lightheaded, dizziness, short of breath, or having chest pain before falling.  Assures fall was purely mechanical in nature.  No recent upper respiratory infection or dehydration.  No other complaints at this time.  The history is provided by the patient and medical records.  Fall     Home Medications Prior to Admission medications   Medication Sig Start Date End Date Taking? Authorizing Provider  aspirin EC 81 MG tablet Take 81 mg by mouth daily.    [provider]  atorvastatin (LIPITOR) 10 MG tablet Take 1 tablet (10 mg total) by mouth daily. Patient taking differently: Take 10 mg by mouth at bedtime. 12/06/19   Argentina Donovan, PA-C  Blood Glucose Monitoring Suppl (ACCU-CHEK GUIDE) w/Device KIT  11/06/20   [provider]  carbidopa-levodopa (SINEMET IR) 25-100 MG tablet Take 1 tablet by mouth 3 (three) times daily. 6am/10am/2-3pm 08/12/21   Tat, Eustace Quail, DO  fluorometholone (FML) 0.1 % ophthalmic suspension Place 1 drop into both eyes 4 (four) times daily. QID for 2 weeks, then BID for 2 weeks, then stop. 04/21/21   [provider]  glipiZIDE (GLUCOTROL XL) 10 MG 24 hr tablet Take 1 tablet (10 mg total) by mouth every morning. 07/24/21   Ladell Pier, MD  JARDIANCE 25 MG TABS tablet Take 1 tablet (25 mg total) by mouth  daily. 04/27/21   Ladell Pier, MD  losartan-hydrochlorothiazide (HYZAAR) 100-25 MG tablet TAKE 1 TABLET BY MOUTH DAILY. 12/06/19 04/27/21  Argentina Donovan, PA-C  nitroGLYCERIN (NITROSTAT) 0.4 MG SL tablet Place 1 tablet (0.4 mg total) under the tongue every 5 (five) minutes as needed for chest pain. 10/18/20   Bonnielee Haff, MD  pantoprazole (PROTONIX) 40 MG tablet TAKE 1 TABLET BY MOUTH DAILY AT 12 NOON 07/23/21   Ladell Pier, MD  tiZANidine (ZANAFLEX) 4 MG tablet Take 4 mg by mouth 3 (three) times daily as needed. 09/26/20   [provider]  simvastatin (ZOCOR) 10 MG tablet Take 10 mg by mouth at bedtime. 01/01/19 02/28/20  [provider]      Allergies    Metformin and related    Review of Systems   Review of Systems  Musculoskeletal:        Neck pain, head pain, left shoulder pain     Physical Exam Updated Vital Signs BP 103/74   Pulse 78   Temp 98.5 F (36.9 C) (Oral)   Resp 16   SpO2 100%  Physical Exam Vitals and nursing note reviewed.  Constitutional:      General: She is not in acute distress.    Appearance: Normal appearance. She is well-developed. She is not ill-appearing or diaphoretic.  HENT:     Head: Normocephalic and atraumatic.     Nose: Nose normal.     Mouth/Throat:  Mouth: Mucous membranes are moist.     Pharynx: Oropharynx is clear.  Eyes:     Extraocular Movements: Extraocular movements intact.     Conjunctiva/sclera: Conjunctivae normal.     Pupils: Pupils are equal, round, and reactive to light.  Neck:     Comments: Neck very supple on exam.  No meningismus.  Mild tenderness of the left neck muscular region. Cardiovascular:     Rate and Rhythm: Normal rate and regular rhythm.     Pulses: Normal pulses.          Radial pulses are 2+ on the right side and 2+ on the left side.     Heart sounds: Normal heart sounds. No murmur heard. Pulmonary:     Effort: Pulmonary effort is normal. No respiratory distress.      Breath sounds: Normal breath sounds.  Abdominal:     Palpations: Abdomen is soft.     Tenderness: There is no abdominal tenderness.  Musculoskeletal:        General: Tenderness present. No swelling.     Cervical back: Neck supple. No rigidity, torticollis or crepitus. Muscular tenderness present. No spinous process tenderness.     Right lower leg: No edema.     Left lower leg: No edema.  Skin:    General: Skin is warm and dry.     Capillary Refill: Capillary refill takes less than 2 seconds.  Neurological:     Mental Status: She is alert and oriented to person, place, and time.     GCS: GCS eye subscore is 4. GCS verbal subscore is 5. GCS motor subscore is 6.     Cranial Nerves: No dysarthria or facial asymmetry.     Sensory: Sensation is intact.     Motor: Motor function is intact.     Coordination: Coordination is intact.  Psychiatric:        Mood and Affect: Mood normal.     ED Results / Procedures / Treatments   Labs (all labs ordered are listed, but only abnormal results are displayed) Labs Reviewed  CBG MONITORING, ED    EKG None  Radiology DG Shoulder Left  Result Date: 08/31/2021 CLINICAL DATA:  Fall, neck pain. EXAM: LEFT SHOULDER - 2+ VIEW COMPARISON:  None Available. FINDINGS: There is no evidence of fracture or dislocation. There is no evidence of arthropathy or other focal bone abnormality. Soft tissues are unremarkable. IMPRESSION: Negative. Electronically Signed   By: Franki Cabot M.D.   On: 08/31/2021 18:07   CT Head Wo Contrast  Result Date: 08/31/2021 CLINICAL DATA:  Status post fall. Complains of shoulder and back pain. No loss of consciousness. EXAM: CT HEAD WITHOUT CONTRAST CT CERVICAL SPINE WITHOUT CONTRAST TECHNIQUE: Multidetector CT imaging of the head and cervical spine was performed following the standard protocol without intravenous contrast. Multiplanar CT image reconstructions of the cervical spine were also generated. RADIATION DOSE REDUCTION:  This exam was performed according to the departmental dose-optimization program which includes automated exposure control, adjustment of the mA and/or kV according to patient size and/or use of iterative reconstruction technique. COMPARISON:  06/20/2021 FINDINGS: CT HEAD FINDINGS Brain: No evidence of acute infarction, hemorrhage, hydrocephalus, extra-axial collection or mass lesion/mass effect. Scattered dural calcifications are again noted. Vascular: No hyperdense vessel or unexpected calcification. Skull: Normal. Negative for fracture or focal lesion. Sinuses/Orbits: No acute finding. Other: None. CT CERVICAL SPINE FINDINGS Alignment: Normal. Skull base and vertebrae: No acute fracture. No primary bone lesion or focal pathologic process.  Soft tissues and spinal canal: No prevertebral fluid or swelling. No visible canal hematoma. Disc levels: Mild degenerative spurring. Disc spaces are relatively well preserved. Upper chest: Negative. Other: None IMPRESSION: 1. No evidence for acute intracranial abnormality. 2. No evidence for cervical spine fracture or subluxation. Electronically Signed   By: Kerby Moors M.D.   On: 08/31/2021 14:40   CT Cervical Spine Wo Contrast  Result Date: 08/31/2021 CLINICAL DATA:  Status post fall. Complains of shoulder and back pain. No loss of consciousness. EXAM: CT HEAD WITHOUT CONTRAST CT CERVICAL SPINE WITHOUT CONTRAST TECHNIQUE: Multidetector CT imaging of the head and cervical spine was performed following the standard protocol without intravenous contrast. Multiplanar CT image reconstructions of the cervical spine were also generated. RADIATION DOSE REDUCTION: This exam was performed according to the departmental dose-optimization program which includes automated exposure control, adjustment of the mA and/or kV according to patient size and/or use of iterative reconstruction technique. COMPARISON:  06/20/2021 FINDINGS: CT HEAD FINDINGS Brain: No evidence of acute infarction,  hemorrhage, hydrocephalus, extra-axial collection or mass lesion/mass effect. Scattered dural calcifications are again noted. Vascular: No hyperdense vessel or unexpected calcification. Skull: Normal. Negative for fracture or focal lesion. Sinuses/Orbits: No acute finding. Other: None. CT CERVICAL SPINE FINDINGS Alignment: Normal. Skull base and vertebrae: No acute fracture. No primary bone lesion or focal pathologic process. Soft tissues and spinal canal: No prevertebral fluid or swelling. No visible canal hematoma. Disc levels: Mild degenerative spurring. Disc spaces are relatively well preserved. Upper chest: Negative. Other: None IMPRESSION: 1. No evidence for acute intracranial abnormality. 2. No evidence for cervical spine fracture or subluxation. Electronically Signed   By: Kerby Moors M.D.   On: 08/31/2021 14:40    Procedures Procedures  {Document cardiac monitor, telemetry assessment procedure when appropriate:1}  Medications Ordered in ED Medications  acetaminophen (TYLENOL) tablet 650 mg (650 mg Oral Given 08/31/21 1804)    ED Course/ Medical Decision Making/ A&P                           Medical Decision Making  71 y.o. female presents to the ED for concern of Fall   This involves an extensive number of treatment options, and is a complaint that carries with it a high risk of complications and morbidity.  The emergent differential diagnosis prior to evaluation includes, but is not limited to: ***  This is not an exhaustive differential.   Past Medical History / Co-morbidities / Social History: Hx of *** Social Determinants of Health include ***  Additional History:  Internal and external records from outside source obtained and reviewed including ***  Lab Tests: I ordered, and personally interpreted labs.  The pertinent results include:   CBC: CMP/BMP:  Imaging Studies: I ordered imaging studies including *** .   I independently visualized and interpreted imaging  which showed no evidence for acute intracranial abnormality or cervical spine fracture or subluxation I agree with the radiologist interpretation.  ED Course: Pt well-appearing on exam.  ***.    Patient in NAD and in good condition at time of discharge.  Disposition: After consideration the patient's encounter today, I do not feel today's workup suggests an emergent condition requiring admission or immediate intervention beyond what has been performed at this time.  Safe for discharge; instructed to return immediately for worsening symptoms, change in symptoms or any other concerns.  I have reviewed the patients home medicines and have made adjustments as needed.  Discussed course of treatment with the patient, whom demonstrated understanding.  Patient in agreement and has no further questions.    I discussed this case with my attending physician Dr. Vanita Panda, who agreed with the proposed treatment course and cosigned this note including patient's presenting symptoms, physical exam, and planned diagnostics and interventions.  Attending physician stated agreement with plan or made changes to plan which were implemented.     This chart was dictated using voice recognition software.  Despite best efforts to proofread, errors can occur which can change the documentation meaning.   {Document critical care time when appropriate:1} {Document review of labs and clinical decision tools ie heart score, Chads2Vasc2 etc:1}  {Document your independent review of radiology images, and any outside records:1} {Document your discussion with family members, caretakers, and with consultants:1} {Document social determinants of health affecting pt's care:1} {Document your decision making why or why not admission, treatments were needed:1} Final Clinical Impression(s) / ED Diagnoses Final diagnoses:  Fall, initial encounter  Neck pain on left side  Acute pain of left shoulder    Rx / DC Orders ED Discharge  Orders     None

## 2021-08-31 NOTE — Discharge Instructions (Addendum)
Your imaging did not show any evidence of acute intracranial hemorrhages, fractures in the neck, or fractures in the shoulder.  Please follow-up with your PCP within the next 2 to 3 days for reevaluation continue medical management.  Continue to rest your injuries and you may utilize Tylenol for additional pain relief.  Return to the ED for new or worsening symptoms as discussed.

## 2021-08-31 NOTE — ED Provider Triage Note (Signed)
Emergency Medicine Provider Triage Evaluation Note  Kristi Campos , a 71 y.o. female  was evaluated in triage.  Pt complains of head, neck pain after falling backwards and striking her head and neck yesterday.  She does not take blood thinners, she denies losing consciousness.  She reports that she has some soreness of the head and neck.  She denies any weakness of her arms or legs.  She has been using a Aust to ambulate..  Review of Systems  Positive: Head pain, neck pain Negative: Numbness, tingling  Physical Exam  BP 103/74   Pulse 78   Temp 98.5 F (36.9 C) (Oral)   Resp 16   SpO2 100%  Gen:   Awake, no distress   Resp:  Normal effort  MSK:   Moves extremities without difficulty  Other:    Medical Decision Making  Medically screening exam initiated at 2:00 PM.  Appropriate orders placed.  Kristi Campos was informed that the remainder of the evaluation will be completed by another provider, this initial triage assessment does not replace that evaluation, and the importance of remaining in the ED until their evaluation is complete.  Workup initiated   Anselmo Pickler, [place] 08/31/21 1401

## 2021-10-01 ENCOUNTER — Telehealth: Payer: Self-pay

## 2021-10-01 ENCOUNTER — Ambulatory Visit: Payer: Medicare Other | Attending: Internal Medicine | Admitting: Internal Medicine

## 2021-10-01 ENCOUNTER — Encounter: Payer: Self-pay | Admitting: Internal Medicine

## 2021-10-01 VITALS — BP 129/92 | HR 83 | Temp 98.1°F | Ht 63.0 in | Wt 191.8 lb

## 2021-10-01 DIAGNOSIS — E1169 Type 2 diabetes mellitus with other specified complication: Secondary | ICD-10-CM | POA: Diagnosis not present

## 2021-10-01 DIAGNOSIS — R4782 Fluency disorder in conditions classified elsewhere: Secondary | ICD-10-CM | POA: Diagnosis not present

## 2021-10-01 DIAGNOSIS — R296 Repeated falls: Secondary | ICD-10-CM | POA: Diagnosis not present

## 2021-10-01 DIAGNOSIS — R519 Headache, unspecified: Secondary | ICD-10-CM | POA: Diagnosis not present

## 2021-10-01 DIAGNOSIS — I10 Essential (primary) hypertension: Secondary | ICD-10-CM | POA: Diagnosis not present

## 2021-10-01 DIAGNOSIS — E669 Obesity, unspecified: Secondary | ICD-10-CM

## 2021-10-01 LAB — POCT GLYCOSYLATED HEMOGLOBIN (HGB A1C): HbA1c, POC (controlled diabetic range): 11.1 % — AB (ref 0.0–7.0)

## 2021-10-01 LAB — GLUCOSE, POCT (MANUAL RESULT ENTRY): POC Glucose: 364 mg/dl — AB (ref 70–99)

## 2021-10-01 MED ORDER — LANTUS SOLOSTAR 100 UNIT/ML ~~LOC~~ SOPN: 12.0000 [IU] | PEN_INJECTOR | Freq: Every day | SUBCUTANEOUS | 99 refills | Status: DC

## 2021-10-01 MED ORDER — PEN NEEDLES 31G X 8 MM MISC: 6 refills | Status: DC

## 2021-10-01 MED ORDER — FREESTYLE LIBRE READER DEVI: 0 refills | Status: DC

## 2021-10-01 MED ORDER — FREESTYLE LIBRE SENSOR SYSTEM MISC: 12 refills | Status: DC

## 2021-10-01 NOTE — Progress Notes (Signed)
Pt fell Tuesday afternoon and has a knot on the back of her head.

## 2021-10-01 NOTE — Progress Notes (Signed)
Patient ID: Kristi Campos, female    DOB: 02-23-1951  MRN: [id number]  CC: Hospitalization Follow-up   Subjective: Kristi Campos is a 71 y.o. female who presents for ER f/u.  Daughter is with her. 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 Katy Fitch).  Daughter, Kristi Campos, is with her.   Patient seen in the emergency room 1 month ago after she fell getting out of the car.  Reportedly fell backwards and landed on left shoulder and hit the back of the head.  She was using her cane instead of the [place] to get out of the car. -Imaging studies in ER revealed no acute findings.  This included x-ray of the left shoulder and CT of the head and cervical spine. [place] again at home 2 days ago when her foot got tangled in her Spera.  She has a sore knot on the back of the head.  Took some Tylenol that day and has not had to take it since.  Had some headache that day but not since then.  Dizziness at times but that is not new and has not worsened.  No blurred vision. -has not had P.T in a while Daughter notes speech more stuttered and problems with word finding over past several mths.  Would like some home speech therapy that helped in past.    DIABETES TYPE 2 Last A1C:   Results for orders placed or performed in visit on 10/01/21  POCT glycosylated hemoglobin (Hb A1C)  Result Value Ref Range   Hemoglobin A1C     HbA1c POC (<> result, manual entry)     HbA1c, POC (prediabetic range)     HbA1c, POC (controlled diabetic range) 11.1 (A) 0.0 - 7.0 %  POCT glucose (manual entry)  Result Value Ref Range   POC Glucose 364 (A) 70 - 99 mg/dl  A1c significantly elevated compared to last visit when it was 8.7. Med Adherence: Currently on glipizide 10 mg and Jardiance 25 mg daily.  Metformin DC'd due to diarrhea. Medication side effects:  _0  Yes    _1  No Home Monitoring?  _2  Yes every morning Home  glucose results range: 200-300's Diet Adherence: trying to stay away from sugar stuff.  Drinks mainly water and coffee with Splenda Exercise: _3  Yes    _4  No Hypoglycemic episodes?: _5  Yes    <[id number]>_2<\/[id number]>_0  No  Diastolic blood pressure elevated today.  She is on Cozaar/HCTZ 100/25 mg and has not taken as yet this a.m Checks BP QOD.  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     atorvastatin (LIPITOR) 10 MG tablet Take 1 tablet (10 mg total) by mouth daily. (Patient taking differently: Take 10 mg by mouth at bedtime.) 90 tablet 1   Blood Glucose Monitoring Suppl (ACCU-CHEK GUIDE) w/Device KIT  carbidopa-levodopa (SINEMET IR) 25-100 MG tablet Take 1 tablet by mouth 3 (three) times daily. 6am/10am/2-3pm 270 tablet 0   fluorometholone (FML) 0.1 % ophthalmic suspension Place 1 drop into both eyes 4 (four) times daily. QID for 2 weeks, then BID for 2 weeks, then stop.     glipiZIDE (GLUCOTROL XL) 10 MG 24 hr tablet Take 1 tablet (10 mg total) by mouth every morning. 30 tablet 4   JARDIANCE 25 MG TABS tablet Take 1 tablet (25 mg total) by mouth daily. 30 tablet 3   nitroGLYCERIN (NITROSTAT) 0.4 MG SL tablet Place 1 tablet (0.4 mg total) under the tongue every 5 (five) minutes as needed for chest pain. 20 tablet 0   pantoprazole (PROTONIX) 40 MG tablet TAKE 1 TABLET BY MOUTH DAILY AT 12 NOON 90 tablet 0   tiZANidine (ZANAFLEX) 4 MG tablet  Take 4 mg by mouth 3 (three) times daily as needed.     losartan-hydrochlorothiazide (HYZAAR) 100-25 MG tablet TAKE 1 TABLET BY MOUTH DAILY. 90 tablet 3   [DISCONTINUED] simvastatin (ZOCOR) 10 MG tablet Take 10 mg by mouth at bedtime.     No current facility-administered medications on file prior to visit.    Allergies  Allergen Reactions   Metformin And Related Diarrhea    Social History   Socioeconomic History   Marital status: Widowed    Spouse name: Not on file   Number of children: 4   Years of education: Not on file   Highest education level: Not on file  Occupational History    Employer: other   Occupation: retired    Comment: weave for living  Tobacco Use   Smoking status: Never   Smokeless tobacco: Never  Vaping Use   Vaping Use: Never used  Substance and Sexual Activity   Alcohol use: No    Alcohol/week: 0.0 standard drinks of alcohol   Drug use: No   Sexual activity: Not Currently    Partners: Male    Birth control/protection: Post-menopausal  Other Topics Concern   Not on file  Social History Narrative   Right handed    Lives at home alone    Social Determinants of Health   Financial Resource Strain: Not on file  Food Insecurity: Not on file  Transportation Needs: Not on file  Physical Activity: Not on file  Stress: Not on file  Social Connections: Not on file  Intimate Partner Violence: Not on file    Family History  Problem Relation Age of Onset   Bipolar disorder Mother    Hypertension Sister    Hypertension Sister    Hypertension Sister    Hypertension Sister    Hypertension Sister    Hypertension Sister    Hypertension Daughter    Diabetes Son    Prostate cancer Son     Past Surgical History:  Procedure Laterality Date   I & D EXTREMITY Right 08/09/2013   Procedure: MINOR IRRIGATION AND DEBRIDEMENT EXTREMITY;  Surgeon: Cammie Sickle, MD;  Location: [facility];  Service: Orthopedics;  Laterality: Right;  long        wound class 4    ROS: Review of Systems Negative except as stated above  PHYSICAL EXAM: BP (!) 129/92   Pulse 83   Temp 98.1 F (36.7 C) (Oral)   Ht _0  (1.6 m)   Wt 191 lb 12.8 oz (87 kg)   SpO2 99%   BMI 33.98 kg/m   Physical Exam  General  appearance - alert, well appearing, and in no distress Mental status -normal mood.  Patient a bit forgetful. Chest - clear to auscultation, no wheezes, rales or rhonchi, symmetric air entry Heart - normal rate, regular rhythm, normal S1, S2, no murmurs, rubs, clicks or gallops Neurological -patient with mild stuttering and problems with word finding.  Otherwise cranial nerves grossly intact.  Power in both upper and lower extremities 5/5 bilaterally.  She ambulates with a rollator Maddix. Extremities -no lower extremity edema. Skin -patient has sore spot in the upper occipital parietal area more so on the left side.  It is not a full-blown hematoma.      Latest Ref Rng & Units 07/23/2021    9:45 AM 10/18/2020    5:44 AM 10/17/2020    2:32 PM  CMP  Glucose 70 - 99 mg/dL 255  171  229   BUN 8 - 27 mg/dL _0 Creatinine 0.57 - 1.00 mg/dL 1.13  0.93  1.33   Sodium 134 - 144 mmol/L 142  138  138   Potassium 3.5 - 5.2 mmol/L 4.7  3.5  3.8   Chloride 96 - 106 mmol/L 101  106  104   CO2 20 - 29 mmol/L _1 Calcium 8.7 - 10.3 mg/dL 10.3  9.2  9.7   Total Protein 6.0 - 8.5 g/dL 7.8  6.8  7.8   Total Bilirubin 0.0 - 1.2 mg/dL 0.4  0.5  0.9   Alkaline Phos 44 - 121 IU/L 93  56  61   AST 0 - 40 IU/L _2 ALT 0 - 32 IU/L _3 Lipid Panel     Component Value Date/Time   CHOL 154 01/27/2021 1134   TRIG 139 01/27/2021 1134   HDL 56 01/27/2021 1134   CHOLHDL 2.8 01/27/2021 1134   LDLCALC 74 01/27/2021 1134    CBC    Component Value Date/Time   WBC 6.5 10/18/2020 0544   RBC 4.13 10/18/2020 0544   HGB 12.7 10/18/2020 0544   HCT 37.9 10/18/2020 0544   PLT 239 10/18/2020 0544   MCV 91.8 10/18/2020 0544    MCH 30.8 10/18/2020 0544   MCHC 33.5 10/18/2020 0544   RDW 14.7 10/18/2020 0544   LYMPHSABS 2.5 10/17/2020 1432   MONOABS 0.5 10/17/2020 1432   EOSABS 0.2 10/17/2020 1432   BASOSABS 0.0 10/17/2020 1432    ASSESSMENT AND PLAN: 1. Recurrent falls Due to known diagnosis of progressive supranuclear palsy. Encourage patient to use her [place] at all times as her cane does not provide enough stability.  Recommend home physical therapy to assist with safety training.  Patient and daughter agreeable. - Ambulatory referral to Home Health  2. Diabetes mellitus type 2 in obese (H[facility]) Not at goal. Recommend adding evening dose of Lantus insulin 12 units.  Patient is agreeable to this.  Went over signs and symptoms of hypoglycemia and how to treat.  She will continue glipizide 10 mg daily and Jardiance 25 mg daily.  Clinical pharmacist met with her today to show insulin administration - POCT glycosylated hemoglobin (Hb A1C) - POCT glucose (manual entry) - insulin glargine (LANTUS SOLOSTAR) 100 UNIT/ML Solostar Pen; Inject 12 Units into the skin daily.  Dispense: 15 mL; Refill: PRN - Continuous Blood Gluc Sensor (FREESTYLE LIBRE SENSOR SYSTEM) MISC; Change sensor Q 2 wks  Dispense: 2 each; Refill: 12 -  Continuous Blood Gluc Receiver (FREESTYLE LIBRE READER) DEVI; UAD  Dispense: 1 each; Refill: 0 - Insulin Pen Needle (PEN NEEDLES) 31G X 8 MM MISC; UAD  Dispense: 100 each; Refill: 6  3. Essential hypertension Not at goal but she has not taken her medication Cozaar/HCTZ 100/25 mg as yet for today.  No changes made.  Advised patient to write down her blood pressure readings when she checks them at home and bring them on next visit.  4. Scalp pain Observe for now. Neurologic exam appears to be at her baseline.  5. Fluency disorder associated with underlying disease Likely associated with her underlying neurologic disorder.  We will request home speech therapy. - Ambulatory referral to [facility]     Patient was given the opportunity to ask questions.  Patient verbalized understanding of the plan and was able to repeat key elements of the plan.   This documentation was completed using Radio producer.  Any transcriptional errors are unintentional.  Orders Placed This Encounter  Procedures   Ambulatory referral to [facility]   POCT glycosylated hemoglobin (Hb A1C)   POCT glucose (manual entry)     Requested Prescriptions   Signed Prescriptions Disp Refills   insulin glargine (LANTUS SOLOSTAR) 100 UNIT/ML Solostar Pen 15 mL PRN    Sig: Inject 12 Units into the skin daily.   Continuous Blood Gluc Sensor (FREESTYLE LIBRE SENSOR SYSTEM) MISC 2 each 12    Sig: Change sensor Q 2 wks   Continuous Blood Gluc Receiver (FREESTYLE LIBRE READER) DEVI 1 each 0    Sig: UAD   Insulin Pen Needle (PEN NEEDLES) 31G X 8 MM MISC 100 each 6    Sig: UAD    No follow-ups on file.  Karle Plumber, MD, FACP

## 2021-10-01 NOTE — Telephone Encounter (Signed)
Referral received for home health PT /ST.  I called the patient to inquire if she has a preference for home health agencies.  She said she has used [place] and would use them again. I explained that I will send the referral to [facility] for review.  If they are not able to accept the referral, I will try other agencies but there is no guarantee that an agency will accept the referral, they need to be in network with her insurance and have available staffing.  She said she understood.  Referral then faxed to [facility] for review.

## 2021-10-01 NOTE — Patient Instructions (Signed)
Your blood sugar is not at goal.  We have added insulin 12 units at bedtime.  Continue glipizide and Jardiance.  I have sent a prescription to your pharmacy for continuous glucose monitor.  Follow-up with the clinical pharmacist in 2 to 3 weeks for recheck on the blood sugars.  I have referred you for home health physical therapy and speech therapy.  They will contact you within a few days.

## 2021-10-05 NOTE — Telephone Encounter (Signed)
I spoke to [employer] who said that they did not receive the referral and she requested it be refaxed.  Fax number confirmed and referral was re-faxed as requested # [phone]

## 2021-10-06 NOTE — Telephone Encounter (Signed)
I spoke to Karen/ [facility] and they are not able to accept the referral.  I spoke to Erica/ [facility] and faxed the referral to her for review as she requested.  Fax # [phone] 

## 2021-10-06 NOTE — Telephone Encounter (Signed)
I spoke to [employer] who said that they did not receive the referral that was re-faxed yesterday.  I confirmed the fax number again and the referral has been sent to the correct fax.  Bartolo Darter said they do not have another fax available.  They will not be able to review the referral.  She said they have been receiving other faxes at that number.

## 2021-10-07 NOTE — Telephone Encounter (Signed)
I spoke to [facility] and they are accepting the referral.  I called the patient and informed her that she will be contacted by [facility] to schedule her home visits

## 2021-10-08 ENCOUNTER — Telehealth: Payer: Self-pay | Admitting: Internal Medicine

## 2021-10-08 DIAGNOSIS — R269 Unspecified abnormalities of gait and mobility: Secondary | ICD-10-CM | POA: Diagnosis not present

## 2021-10-08 DIAGNOSIS — E119 Type 2 diabetes mellitus without complications: Secondary | ICD-10-CM | POA: Diagnosis not present

## 2021-10-08 DIAGNOSIS — M6281 Muscle weakness (generalized): Secondary | ICD-10-CM | POA: Diagnosis not present

## 2021-10-08 DIAGNOSIS — E785 Hyperlipidemia, unspecified: Secondary | ICD-10-CM | POA: Diagnosis not present

## 2021-10-08 DIAGNOSIS — I10 Essential (primary) hypertension: Secondary | ICD-10-CM | POA: Diagnosis not present

## 2021-10-08 DIAGNOSIS — R296 Repeated falls: Secondary | ICD-10-CM | POA: Diagnosis not present

## 2021-10-08 DIAGNOSIS — G20 Parkinson's disease: Secondary | ICD-10-CM | POA: Diagnosis not present

## 2021-10-08 DIAGNOSIS — R4782 Fluency disorder in conditions classified elsewhere: Secondary | ICD-10-CM | POA: Diagnosis not present

## 2021-10-08 NOTE — Telephone Encounter (Signed)
Home Health Verbal Orders - Caller/Agency: [facility]  Callback Number: [phone]/ vm can be left  Requesting speech pathology  Frequency: 1x a week for 1 week ans  2xs a week for 8 weeks

## 2021-10-09 NOTE — Telephone Encounter (Signed)
Verbal order were given for patient. 

## 2021-10-12 DIAGNOSIS — I10 Essential (primary) hypertension: Secondary | ICD-10-CM | POA: Diagnosis not present

## 2021-10-12 DIAGNOSIS — E119 Type 2 diabetes mellitus without complications: Secondary | ICD-10-CM | POA: Diagnosis not present

## 2021-10-12 DIAGNOSIS — E785 Hyperlipidemia, unspecified: Secondary | ICD-10-CM | POA: Diagnosis not present

## 2021-10-12 DIAGNOSIS — G20 Parkinson's disease: Secondary | ICD-10-CM | POA: Diagnosis not present

## 2021-10-12 DIAGNOSIS — R296 Repeated falls: Secondary | ICD-10-CM | POA: Diagnosis not present

## 2021-10-12 DIAGNOSIS — R4782 Fluency disorder in conditions classified elsewhere: Secondary | ICD-10-CM | POA: Diagnosis not present

## 2021-10-12 DIAGNOSIS — R269 Unspecified abnormalities of gait and mobility: Secondary | ICD-10-CM | POA: Diagnosis not present

## 2021-10-12 DIAGNOSIS — M6281 Muscle weakness (generalized): Secondary | ICD-10-CM | POA: Diagnosis not present

## 2021-10-14 ENCOUNTER — Telehealth: Payer: Self-pay | Admitting: Neurology

## 2021-10-14 ENCOUNTER — Encounter: Payer: Self-pay | Admitting: Internal Medicine

## 2021-10-14 ENCOUNTER — Telehealth: Payer: Medicare Other | Admitting: Emergency Medicine

## 2021-10-14 ENCOUNTER — Ambulatory Visit: Payer: Self-pay

## 2021-10-14 DIAGNOSIS — M6281 Muscle weakness (generalized): Secondary | ICD-10-CM | POA: Diagnosis not present

## 2021-10-14 DIAGNOSIS — E785 Hyperlipidemia, unspecified: Secondary | ICD-10-CM | POA: Diagnosis not present

## 2021-10-14 DIAGNOSIS — R4782 Fluency disorder in conditions classified elsewhere: Secondary | ICD-10-CM | POA: Diagnosis not present

## 2021-10-14 DIAGNOSIS — E119 Type 2 diabetes mellitus without complications: Secondary | ICD-10-CM | POA: Diagnosis not present

## 2021-10-14 DIAGNOSIS — I10 Essential (primary) hypertension: Secondary | ICD-10-CM | POA: Diagnosis not present

## 2021-10-14 DIAGNOSIS — G20 Parkinson's disease: Secondary | ICD-10-CM | POA: Diagnosis not present

## 2021-10-14 DIAGNOSIS — R269 Unspecified abnormalities of gait and mobility: Secondary | ICD-10-CM | POA: Diagnosis not present

## 2021-10-14 DIAGNOSIS — K5900 Constipation, unspecified: Secondary | ICD-10-CM | POA: Diagnosis not present

## 2021-10-14 DIAGNOSIS — R296 Repeated falls: Secondary | ICD-10-CM | POA: Diagnosis not present

## 2021-10-14 MED ORDER — GOLYTELY 236 G PO SOLR: ORAL | 0 refills | Status: DC

## 2021-10-14 MED ORDER — DOCUSATE SODIUM 100 MG PO CAPS: 100.0000 mg | ORAL_CAPSULE | Freq: Two times a day (BID) | ORAL | 0 refills | Status: DC

## 2021-10-14 NOTE — Progress Notes (Addendum)
Virtual Visit Consent   Kristi Campos, you are scheduled for a virtual visit with a [facility] provider today. Just as with appointments in the office, your consent must be obtained to participate. Your consent will be active for this visit and any virtual visit you may have with one of our providers in the next 365 days. If you have a MyChart account, a copy of this consent can be sent to you electronically.  As this is a virtual visit, video technology does not allow for your provider to perform a traditional examination. This may limit your provider's ability to fully assess your condition. If your provider identifies any concerns that need to be evaluated in person or the need to arrange testing (such as labs, EKG, etc.), we will make arrangements to do so. Although advances in technology are sophisticated, we cannot ensure that it will always work on either your end or our end. If the connection with a video visit is poor, the visit may have to be switched to a telephone visit. With either a video or telephone visit, we are not always able to ensure that we have a secure connection.  By engaging in this virtual visit, you consent to the provision of healthcare and authorize for your insurance to be billed (if applicable) for the services provided during this visit. Depending on your insurance coverage, you may receive a charge related to this service.  I need to obtain your verbal consent now. Are you willing to proceed with your visit today? Rosanne Vetter has provided verbal consent on 10/14/2021 for a virtual visit (video or telephone). Montine Circle, PA-C  Date: 10/14/2021 10:42 AM  Virtual Visit via Video Note   I, Montine Circle, connected with  [name]  ([id number], [birth year]) on 10/14/21 at 10:45 AM EDT by a video-enabled telemedicine application and verified that I am speaking with the correct person using two identifiers.  Location: Patient: Virtual Visit Location Patient:  Home Provider: Virtual Visit Location Provider: Home   I discussed the limitations of evaluation and management by telemedicine and the availability of in person appointments. The patient expressed understanding and agreed to proceed.    History of Present Illness: Kristi Campos is a 71 y.o. who identifies as a female who was assigned female at birth, and is being seen today for constipation for the past 2 weeks.  Has tried miralax a capful per day. States that she has been drinking 2-3 bottles of water per day.  Has had small balls of stool.  Denies fever.  Denies pain.  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 (Patient taking differently: Take 10 mg by mouth at bedtime.), Disp: 90 tablet, Rfl: 1   Blood Glucose Monitoring Suppl (ACCU-CHEK GUIDE) w/Device KIT, , Disp: ,  Rfl:    carbidopa-levodopa (SINEMET IR) 25-100 MG tablet, Take 1 tablet by mouth 3 (three) times daily. 6am/10am/2-3pm, Disp: 270 tablet, Rfl: 0   Continuous Blood Gluc Receiver (FREESTYLE LIBRE READER) DEVI, UAD, Disp: 1 each, Rfl: 0   Continuous Blood Gluc Sensor (FREESTYLE LIBRE SENSOR SYSTEM) MISC, Change sensor Q 2 wks, Disp: 2 each, Rfl: 12   fluorometholone (FML) 0.1 % ophthalmic suspension, Place 1 drop into both  eyes 4 (four) times daily. QID for 2 weeks, then BID for 2 weeks, then stop., Disp: , Rfl:    glipiZIDE (GLUCOTROL XL) 10 MG 24 hr tablet, Take 1 tablet (10 mg total) by mouth every morning., Disp: 30 tablet, Rfl: 4   insulin glargine (LANTUS SOLOSTAR) 100 UNIT/ML Solostar Pen, Inject 12 Units into the skin daily., Disp: 15 mL, Rfl: PRN   Insulin Pen Needle (PEN NEEDLES) 31G X 8 MM MISC, UAD, Disp: 100 each, Rfl: 6   JARDIANCE 25 MG TABS tablet, Take 1 tablet (25 mg total) by mouth daily., Disp: 30 tablet, Rfl: 3   losartan-hydrochlorothiazide (HYZAAR) 100-25 MG tablet, TAKE 1 TABLET BY MOUTH DAILY., Disp: 90 tablet, Rfl: 3   nitroGLYCERIN (NITROSTAT) 0.4 MG SL tablet, Place 1 tablet (0.4 mg total) under the tongue every 5 (five) minutes as needed for chest pain., Disp: 20 tablet, Rfl: 0   pantoprazole (PROTONIX) 40 MG tablet, TAKE 1 TABLET BY MOUTH DAILY AT 12 NOON, Disp: 90 tablet, Rfl: 0   tiZANidine (ZANAFLEX) 4 MG tablet, Take 4 mg by mouth 3 (three) times daily as needed., Disp: , Rfl:   Observations/Objective: Patient is well-developed, well-nourished in no acute distress.  Resting comfortably  at home.  Head is normocephalic, atraumatic.  No labored breathing.  Speech is clear and coherent with logical content.  Patient is alert and oriented at baseline.    Assessment and Plan: 1. Constipation, unspecified constipation type  We discussed increasing hydration and miralax.  Patient hasn't had any abdominal surgeries.  She has had success with colonoscopy prep before.  We discussed this, but will trial Colace BID and increasing fluids and miralax instead.   Follow Up Instructions: I discussed the assessment and treatment plan with the patient. The patient was provided an opportunity to ask questions and all were answered. The patient agreed with the plan and demonstrated an understanding of the instructions.  A copy of instructions were sent to the patient via MyChart unless otherwise  noted below.     The patient was advised to call back or seek an in-person evaluation if the symptoms worsen or if the condition fails to improve as anticipated.  Time:  I spent 10 minutes with the patient via telehealth technology discussing the above problems/concerns.    Montine Circle, PA-C

## 2021-10-14 NOTE — Telephone Encounter (Signed)
Patient's daughter called and said patient is complaining of overall tiredness, especially when eating and chewing.  She'd like to get her in sooner than 12/08/21 with Dr. Carles Collet or get Dr. Doristine Devoid recommendation.

## 2021-10-14 NOTE — Telephone Encounter (Signed)
  Chief Complaint: Constipation 3 days Symptoms: Hard stools 3 days Frequency: Constipation ongoing issue Pertinent Negatives: Patient denies Fever. Disposition: '[]'$ ED /'[]'$ Urgent Care (no appt availability in office) / '[x]'$ Appointment(In office/virtual)/ '[]'$  [facility]/ '[]'$ Home Care/ '[]'$ Refused Recommended Disposition /'[]'$ [facility]/ '[]'$  Follow-up with PCP Additional Notes: Spoke with daughter, [name], Pt has used Miralax in the past when constipated. Miralax is not working. Pt has had hard small stools the past 3 days. Pt is also having abdominal discomfort.   Summary: stomach discomfort / rx req   The patient's daughter shares that the patient has experienced constipation for roughly 3 days   The patient has expressed discomfort in their stomach   The patient's daughter would like for the patient to be prescribed a laxative or stool softener   Please contact further when possible      Reason for Disposition  [1] Uses laxative (e.g., PEG / Miralax, Milk of Magnesia) or enema AND [2] more than once a month  Answer Assessment - Initial Assessment Questions 1. STOOL PATTERN OR FREQUENCY: "How often do you have a bowel movement (BM)?"  (Normal range: 3 times a day to every 3 days)  "When was your last BM?"       2x a day - 2 weeks since good BM 2. STRAINING: "Do you have to strain to have a BM?"      yes 3. RECTAL PAIN: "Does your rectum hurt when the stool comes out?" If Yes, ask: "Do you have hemorrhoids? How bad is the pain?"  (Scale 1-10; or mild, moderate, severe)     no 4. STOOL COMPOSITION: "Are the stools hard?"      yes 5. BLOOD ON STOOLS: "Has there been any blood on the toilet tissue or on the surface of the BM?" If Yes, ask: "When was the last time?"     no 6. CHRONIC CONSTIPATION: "Is this a new problem for you?"  If No, ask: "How long have you had this problem?" (days, weeks, months)      recent 7. CHANGES IN DIET OR HYDRATION: "Have there been any  recent changes in your diet?" "How much fluids are you drinking on a daily basis?"  "How much have you had to drink today?"     Usually takes mira lax and it works well 8. MEDICINES: "Have you been taking any new medicines?" "Are you taking any narcotic pain medicines?" (e.g., Dilaudid, morphine, Percocet, Vicodin)     Started insulin 9. LAXATIVES: "Have you been using any stool softeners, laxatives, or enemas?"  If Yes, ask "What, how often, and when was the last time?"     Miramax 10. ACTIVITY:  "How much walking do you do every day?"  "Has your activity level decreased in the past week?"        no 11. CAUSE: "What do you think is causing the constipation?"        unsure 12. OTHER SYMPTOMS: "Do you have any other symptoms?" (e.g., abdomen pain, bloating, fever, vomiting)       Abdominal pain 13. MEDICAL HISTORY: "Do you have a history of hemorrhoids, rectal fissures, or rectal surgery or rectal abscess?"         no 14. PREGNANCY: "Is there any chance you are pregnant?" "When was your last menstrual period?"       na  Protocols used: Constipation-A-AH

## 2021-10-14 NOTE — Addendum Note (Signed)
Addended by: Montine Circle B on: 10/14/2021 11:01 AM   Modules accepted: Orders

## 2021-10-14 NOTE — Telephone Encounter (Signed)
Noted where patient had video visit with PA Browning and was given stool softener.

## 2021-10-14 NOTE — Telephone Encounter (Signed)
Need to see if a provider can see this patient in next one week

## 2021-10-14 NOTE — Telephone Encounter (Signed)
Called patients daughter she is also having dizziness having trouble walking but does use her Deshotel at all times. She is taking carbidopa levodopa 1 pill 3 times a day

## 2021-10-16 DIAGNOSIS — R4782 Fluency disorder in conditions classified elsewhere: Secondary | ICD-10-CM | POA: Diagnosis not present

## 2021-10-16 DIAGNOSIS — I10 Essential (primary) hypertension: Secondary | ICD-10-CM | POA: Diagnosis not present

## 2021-10-16 DIAGNOSIS — G20 Parkinson's disease: Secondary | ICD-10-CM | POA: Diagnosis not present

## 2021-10-16 DIAGNOSIS — R269 Unspecified abnormalities of gait and mobility: Secondary | ICD-10-CM | POA: Diagnosis not present

## 2021-10-16 DIAGNOSIS — E785 Hyperlipidemia, unspecified: Secondary | ICD-10-CM | POA: Diagnosis not present

## 2021-10-16 DIAGNOSIS — M6281 Muscle weakness (generalized): Secondary | ICD-10-CM | POA: Diagnosis not present

## 2021-10-16 DIAGNOSIS — E119 Type 2 diabetes mellitus without complications: Secondary | ICD-10-CM | POA: Diagnosis not present

## 2021-10-16 DIAGNOSIS — R296 Repeated falls: Secondary | ICD-10-CM | POA: Diagnosis not present

## 2021-10-16 NOTE — Telephone Encounter (Signed)
Called spoke w/ Pt she informed of oncall providers notes to be scheduled for appt. Pt expressed understanding. Pt was scheduled for appt w/ ppc on Thu 10/22/21'@9'$ :10am for Dizziness & Fatigued.------DD,RMA

## 2021-10-18 ENCOUNTER — Other Ambulatory Visit: Payer: Self-pay | Admitting: Internal Medicine

## 2021-10-18 DIAGNOSIS — K219 Gastro-esophageal reflux disease without esophagitis: Secondary | ICD-10-CM

## 2021-10-19 DIAGNOSIS — E785 Hyperlipidemia, unspecified: Secondary | ICD-10-CM | POA: Diagnosis not present

## 2021-10-19 DIAGNOSIS — G20 Parkinson's disease: Secondary | ICD-10-CM | POA: Diagnosis not present

## 2021-10-19 DIAGNOSIS — R296 Repeated falls: Secondary | ICD-10-CM | POA: Diagnosis not present

## 2021-10-19 DIAGNOSIS — R269 Unspecified abnormalities of gait and mobility: Secondary | ICD-10-CM | POA: Diagnosis not present

## 2021-10-19 DIAGNOSIS — I10 Essential (primary) hypertension: Secondary | ICD-10-CM | POA: Diagnosis not present

## 2021-10-19 DIAGNOSIS — R4782 Fluency disorder in conditions classified elsewhere: Secondary | ICD-10-CM | POA: Diagnosis not present

## 2021-10-19 DIAGNOSIS — M6281 Muscle weakness (generalized): Secondary | ICD-10-CM | POA: Diagnosis not present

## 2021-10-19 DIAGNOSIS — E119 Type 2 diabetes mellitus without complications: Secondary | ICD-10-CM | POA: Diagnosis not present

## 2021-10-19 NOTE — Telephone Encounter (Signed)
Pt daughter called no answer left a voice mail to call the office back

## 2021-10-20 DIAGNOSIS — I10 Essential (primary) hypertension: Secondary | ICD-10-CM | POA: Diagnosis not present

## 2021-10-20 DIAGNOSIS — G20 Parkinson's disease: Secondary | ICD-10-CM | POA: Diagnosis not present

## 2021-10-20 DIAGNOSIS — R296 Repeated falls: Secondary | ICD-10-CM | POA: Diagnosis not present

## 2021-10-20 DIAGNOSIS — R4782 Fluency disorder in conditions classified elsewhere: Secondary | ICD-10-CM | POA: Diagnosis not present

## 2021-10-20 DIAGNOSIS — M6281 Muscle weakness (generalized): Secondary | ICD-10-CM | POA: Diagnosis not present

## 2021-10-20 DIAGNOSIS — R269 Unspecified abnormalities of gait and mobility: Secondary | ICD-10-CM | POA: Diagnosis not present

## 2021-10-20 DIAGNOSIS — E119 Type 2 diabetes mellitus without complications: Secondary | ICD-10-CM | POA: Diagnosis not present

## 2021-10-20 DIAGNOSIS — E785 Hyperlipidemia, unspecified: Secondary | ICD-10-CM | POA: Diagnosis not present

## 2021-10-20 NOTE — Telephone Encounter (Signed)
Requested Prescriptions  Pending Prescriptions Disp Refills  . pantoprazole (PROTONIX) 40 MG tablet [Pharmacy Med Name: PANTOPRAZOLE '40MG'$  TABLETS] 90 tablet 0    Sig: TAKE 1 TABLET BY MOUTH DAILY AT 12 NOON     Gastroenterology: Proton Pump Inhibitors Passed - 10/18/2021  3:19 PM      Passed - Valid encounter within last 12 months    Recent Outpatient Visits          2 weeks ago Recurrent falls   [place], MD   2 months ago Diabetes mellitus type 2 in obese [facility])   [place], MD   5 months ago Diabetes mellitus type 2 in obese [facility])   [place], Jarome Matin, RPH-CPP   6 months ago Encounter for [employer] annual wellness exam   [place] Karle Plumber B, MD   8 months ago Diabetes mellitus type 2 in obese [facility])   [place], MD      Future Appointments            In 2 days Ladell Pier, MD [place]   In 2 weeks Daisy Blossom, Jarome Matin, [place]   In 1 month Wynetta Emery, Dalbert Batman, MD [place]

## 2021-10-21 DIAGNOSIS — M6281 Muscle weakness (generalized): Secondary | ICD-10-CM | POA: Diagnosis not present

## 2021-10-21 DIAGNOSIS — E785 Hyperlipidemia, unspecified: Secondary | ICD-10-CM | POA: Diagnosis not present

## 2021-10-21 DIAGNOSIS — R4782 Fluency disorder in conditions classified elsewhere: Secondary | ICD-10-CM | POA: Diagnosis not present

## 2021-10-21 DIAGNOSIS — E119 Type 2 diabetes mellitus without complications: Secondary | ICD-10-CM | POA: Diagnosis not present

## 2021-10-21 DIAGNOSIS — I10 Essential (primary) hypertension: Secondary | ICD-10-CM | POA: Diagnosis not present

## 2021-10-21 DIAGNOSIS — G20 Parkinson's disease: Secondary | ICD-10-CM | POA: Diagnosis not present

## 2021-10-21 DIAGNOSIS — R296 Repeated falls: Secondary | ICD-10-CM | POA: Diagnosis not present

## 2021-10-21 DIAGNOSIS — R269 Unspecified abnormalities of gait and mobility: Secondary | ICD-10-CM | POA: Diagnosis not present

## 2021-10-21 NOTE — Telephone Encounter (Signed)
Called and spoke to patients daughter and informed her that patient may stop the Levodopa. Patients daughter stated that patient is having Jaw fatigue and becomes very tired when chewing. Patients daughter wants to let Dr. Carles Collet know that patient will be seeing her PCP tomorrow 10/22/21.

## 2021-10-22 ENCOUNTER — Encounter: Payer: Self-pay | Admitting: Internal Medicine

## 2021-10-22 ENCOUNTER — Ambulatory Visit: Payer: Medicare Other | Attending: Internal Medicine | Admitting: Internal Medicine

## 2021-10-22 VITALS — BP 99/70 | HR 73 | Ht 63.0 in | Wt 187.3 lb

## 2021-10-22 DIAGNOSIS — R42 Dizziness and giddiness: Secondary | ICD-10-CM

## 2021-10-22 DIAGNOSIS — H538 Other visual disturbances: Secondary | ICD-10-CM

## 2021-10-22 DIAGNOSIS — R079 Chest pain, unspecified: Secondary | ICD-10-CM

## 2021-10-22 DIAGNOSIS — R296 Repeated falls: Secondary | ICD-10-CM | POA: Diagnosis not present

## 2021-10-22 DIAGNOSIS — M2669 Other specified disorders of temporomandibular joint: Secondary | ICD-10-CM

## 2021-10-22 DIAGNOSIS — E669 Obesity, unspecified: Secondary | ICD-10-CM

## 2021-10-22 DIAGNOSIS — R269 Unspecified abnormalities of gait and mobility: Secondary | ICD-10-CM | POA: Diagnosis not present

## 2021-10-22 DIAGNOSIS — R0609 Other forms of dyspnea: Secondary | ICD-10-CM | POA: Diagnosis not present

## 2021-10-22 DIAGNOSIS — G20 Parkinson's disease: Secondary | ICD-10-CM | POA: Diagnosis not present

## 2021-10-22 DIAGNOSIS — M6281 Muscle weakness (generalized): Secondary | ICD-10-CM | POA: Diagnosis not present

## 2021-10-22 DIAGNOSIS — I10 Essential (primary) hypertension: Secondary | ICD-10-CM | POA: Diagnosis not present

## 2021-10-22 DIAGNOSIS — E119 Type 2 diabetes mellitus without complications: Secondary | ICD-10-CM | POA: Diagnosis not present

## 2021-10-22 DIAGNOSIS — R5383 Other fatigue: Secondary | ICD-10-CM

## 2021-10-22 DIAGNOSIS — E1169 Type 2 diabetes mellitus with other specified complication: Secondary | ICD-10-CM | POA: Diagnosis not present

## 2021-10-22 DIAGNOSIS — R4782 Fluency disorder in conditions classified elsewhere: Secondary | ICD-10-CM | POA: Diagnosis not present

## 2021-10-22 DIAGNOSIS — E785 Hyperlipidemia, unspecified: Secondary | ICD-10-CM | POA: Diagnosis not present

## 2021-10-22 LAB — GLUCOSE, POCT (MANUAL RESULT ENTRY): POC Glucose: 217 mg/dl — AB (ref 70–99)

## 2021-10-22 MED ORDER — LANTUS SOLOSTAR 100 UNIT/ML ~~LOC~~ SOPN: 15.0000 [IU] | PEN_INJECTOR | Freq: Every day | SUBCUTANEOUS | 99 refills | Status: DC

## 2021-10-22 NOTE — Telephone Encounter (Signed)
I sent pcp a message so that she knew about jaw fatigue.  Jaw fatigue is very concerning and needs urgent/emergent work up and urgent/emergent steroids.  This is a bit out of my field which is why I sent pcp a note to evaluate it today to see if this is the case.  May need temporal artery biopsy and ophthalmology f/u but we will see what happens with pcp visit today.  Call daughter and express urgency that she attends appt today

## 2021-10-22 NOTE — Telephone Encounter (Signed)
PCP answered me back this morning and thanked me for message and said she would address the jaw fatigue this morning. Appreciate collaboration.

## 2021-10-22 NOTE — Progress Notes (Incomplete)
Patient ID: Kristi Campos, female    DOB: 1951-02-27  MRN: [id number]  CC: Dizziness and Fatigue   Subjective: Kristi Campos is a 71 y.o. 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  Daughter, [name], is with her.   Pt c/o fatigue and dizziness x 1 mth Had reported some dizzines on last visit earlier this mth after fall for which she was seen in ER.  At that time she reported dizziness was not new and had not worsened. -For the past month she has been having dizziness that lasts a few seconds whenever she gets up out of bed.  Dizziness sometimes when walking.  Checks BP every other day and reports levels have been low but does not have log and does not recall readings -Feels fatigue has worsened x2 weeks.  Struggles more than usual to get up and walk.  No further falls.   Reports "tiredness in jaw area" when she chews.  No pain with chewing, no scalp tenderness.  Occasional occipital HA -referred for home PT/ST.  They have been coming out 2x/wk; finding it helpful Shortness of breath with minimal activity.  Endorses some substernal chest pain about 3 times in the past week.  No radiation.  Last episode occurred at rest.  Had normal echo about 1 year ago and negative coronary CT in 2021.  -Also reports blurred vision for past few weeks.  Problems seeing things close up and far away.  Television is blurry.  Has prescription glasses for reading and distance but does not wear them most of the times.  History of glaucoma left eye followed by Dr. Dione Booze.  I inquired about whether she is on eyedrops.  Her daughter states that she is but patient said Dr. Dione Booze told her to stop the eyedrops on last visit with him 04/2021.  Next appt 11/2021.  On last visit, we added Lantus insulin 12 units daily to glipizide and Jardiance.  She checks blood sugars in the  mornings before breakfast.  Numbers have been in the 200s. Patient Active Problem List   Diagnosis Date Noted  . Progressive supranuclear palsy (HCC) 01/27/2021  . AKI (acute kidney injury) (HCC) 10/18/2020  . Chest pain 10/17/2020  . Primary osteoarthritis, right wrist 02/28/2020  . Peripheral arterial disease (HCC) 10/03/2019  . Essential hypertension 10/03/2019  . Hyperlipidemia 10/03/2019  . Diabetes mellitus type 2 in obese (HCC) 11/24/2018  . Chronic right-sided low back pain without sciatica 10/24/2018     Current Outpatient Medications on File Prior to Visit  Medication Sig Dispense Refill  . aspirin EC 81 MG tablet Take 81 mg by mouth daily.    Marland Kitchen atorvastatin (LIPITOR) 10 MG tablet Take 1 tablet (10 mg total) by mouth daily. (Patient taking differently: Take 10 mg by mouth at bedtime.) 90 tablet 1  . Blood Glucose Monitoring Suppl (ACCU-CHEK GUIDE) w/Device KIT     . Continuous Blood Gluc Receiver (FREESTYLE LIBRE READER) DEVI UAD 1 each 0  . Continuous Blood Gluc Sensor (FREESTYLE LIBRE SENSOR SYSTEM) MISC Change sensor Q 2 wks 2 each 12  . docusate sodium (COLACE) 100 MG capsule Take 1 capsule (100 mg total) by mouth every 12 (twelve) hours. 30 capsule 0  . glipiZIDE (GLUCOTROL XL) 10 MG 24 hr tablet Take 1 tablet (10 mg total) by mouth every  morning. 30 tablet 4  . Insulin Pen Needle (PEN NEEDLES) 31G X 8 MM MISC UAD 100 each 6  . JARDIANCE 25 MG TABS tablet Take 1 tablet (25 mg total) by mouth daily. 30 tablet 3  . nitroGLYCERIN (NITROSTAT) 0.4 MG SL tablet Place 1 tablet (0.4 mg total) under the tongue every 5 (five) minutes as needed for chest pain. 20 tablet 0  . pantoprazole (PROTONIX) 40 MG tablet TAKE 1 TABLET BY MOUTH DAILY AT 12 NOON 90 tablet 0  . tiZANidine (ZANAFLEX) 4 MG tablet Take 4 mg by mouth 3 (three) times daily as needed.    Marland Kitchen losartan-hydrochlorothiazide (HYZAAR) 100-25 MG tablet TAKE 1 TABLET BY MOUTH DAILY. 90 tablet 3  . [DISCONTINUED] simvastatin  (ZOCOR) 10 MG tablet Take 10 mg by mouth at bedtime.     No current facility-administered medications on file prior to visit.    Allergies  Allergen Reactions  . Metformin And Related Diarrhea    Social History   Socioeconomic History  . Marital status: Widowed    Spouse name: Not on file  . Number of children: 4  . Years of education: Not on file  . Highest education level: Not on file  Occupational History    Employer: other  . Occupation: retired    Comment: weave for living  Tobacco Use  . Smoking status: Never  . Smokeless tobacco: Never  Vaping Use  . Vaping Use: Never used  Substance and Sexual Activity  . Alcohol use: No    Alcohol/week: 0.0 standard drinks of alcohol  . Drug use: No  . Sexual activity: Not Currently    Partners: Male    Birth control/protection: Post-menopausal  Other Topics Concern  . Not on file  Social History Narrative   Right handed    Lives at home alone    Social Determinants of Health   Financial Resource Strain: Not on file  Food Insecurity: Not on file  Transportation Needs: Not on file  Physical Activity: Not on file  Stress: Not on file  Social Connections: Not on file  Intimate Partner Violence: Not on file    Family History  Problem Relation Age of Onset  . Bipolar disorder Mother   . Hypertension Sister   . Hypertension Sister   . Hypertension Sister   . Hypertension Sister   . Hypertension Sister   . Hypertension Sister   . Hypertension Daughter   . Diabetes Son   . Prostate cancer Son     Past Surgical History:  Procedure Laterality Date  . I & D EXTREMITY Right 08/09/2013   Procedure: MINOR IRRIGATION AND DEBRIDEMENT EXTREMITY;  Surgeon: Cammie Sickle, MD;  Location: [facility];  Service: Orthopedics;  Laterality: Right;  long       wound class 4    ROS: Review of Systems Negative except as stated above  PHYSICAL EXAM: BP 99/70   Pulse 73   Ht $R'5\' 3"'GY$  (1.6 m)   Wt 187 lb 4.8 oz  (85 kg)   SpO2 99%   BMI 33.18 kg/m   Wt Readings from Last 3 Encounters:  10/22/21 187 lb 4.8 oz (85 kg)  10/01/21 191 lb 12.8 oz (87 kg)  07/23/21 187 lb 9.6 oz (85.1 kg)   Laying: BP 114/74, P70 Sitting: BP 108/71 pulse 72 Standing: BP 106/73, pulse 92  Physical Exam  General appearance - alert, well appearing, older AAF and in no distress Mental status - oriented  to person and place Eyes - slightly pale conjunctiva Mouth - oral mucosa moist Neck -no cervical lymphadenopathy.  No thyroid enlargement. Chest - clear to auscultation, no wheezes, rales or rhonchi, symmetric air entry Heart - normal rate, regular rhythm, normal S1, S2, no murmurs, rubs, clicks or gallops Neurological -Dilated eye exam not done.  Extraocular movements intact but noted to be slow   Patient with stuttering and decreased verbal fluency no worse than what was observed on previous visits. No scalp tenderness.  No tenderness on palpation over the temporal arteries. Grip 4+/5 bilaterally.  Power upper extremities 5/5 bilaterally.  Power lower extremities 4+/5 bilaterally proximally and distally. Gait: Stiff and needs hands on assistance with getting on exam table. Ambulates with a rollator Booher.  She has freezing of gait intermittently especially with turns.  Turning en bloc.  Low foot to floor clearance. Extremities -no lower extremity edema.       Latest Ref Rng & Units 07/23/2021    9:45 AM 10/18/2020    5:44 AM 10/17/2020    2:32 PM  CMP  Glucose 70 - 99 mg/dL 255  171  229   BUN 8 - 27 mg/dL $Remove'16  16  16   'chgoFLO$ Creatinine 0.57 - 1.00 mg/dL 1.13  0.93  1.33   Sodium 134 - 144 mmol/L 142  138  138   Potassium 3.5 - 5.2 mmol/L 4.7  3.5  3.8   Chloride 96 - 106 mmol/L 101  106  104   CO2 20 - 29 mmol/L $RemoveB'22  24  24   'AadXcNdz$ Calcium 8.7 - 10.3 mg/dL 10.3  9.2  9.7   Total Protein 6.0 - 8.5 g/dL 7.8  6.8  7.8   Total Bilirubin 0.0 - 1.2 mg/dL 0.4  0.5  0.9   Alkaline Phos 44 - 121 IU/L 93  56  61   AST 0 - 40  IU/L $Rem'18  18  19   'yYOg$ ALT 0 - 32 IU/L $Remov'17  13  16    'SYTIBr$ Lipid Panel     Component Value Date/Time   CHOL 154 01/27/2021 1134   TRIG 139 01/27/2021 1134   HDL 56 01/27/2021 1134   CHOLHDL 2.8 01/27/2021 1134   LDLCALC 74 01/27/2021 1134    CBC    Component Value Date/Time   WBC 6.5 10/18/2020 0544   RBC 4.13 10/18/2020 0544   HGB 12.7 10/18/2020 0544   HCT 37.9 10/18/2020 0544   PLT 239 10/18/2020 0544   MCV 91.8 10/18/2020 0544   MCH 30.8 10/18/2020 0544   MCHC 33.5 10/18/2020 0544   RDW 14.7 10/18/2020 0544   LYMPHSABS 2.5 10/17/2020 1432   MONOABS 0.5 10/17/2020 1432   EOSABS 0.2 10/17/2020 1432   BASOSABS 0.0 10/17/2020 1432   Results for orders placed or performed in visit on 10/22/21  POCT glucose (manual entry)  Result Value Ref Range   POC Glucose 217 (A) 70 - 99 mg/dl    ASSESSMENT AND PLAN: 1. Fatigue, unspecified type Likely related to underling dx of PSP Will check some baseline blood test as well including CBC - CBC - Comprehensive metabolic panel  2. Dizziness Constellation of symptoms of dizziness, jaw claudication and blurred vision concerning for GCA but again could be due to progression of PSP.   -will check CBC, ESR and CRP Also history of glaucoma with question of whether she should be on eyedrops or not.  Encourage patient to wear her rxn eyeglasses consistently. Rec getting back  in with Dr. Katy Fitch ASAP for eye exam. Orthostatic changes on exam with pulse.  Advise to go slow with position changes. - CBC - Comprehensive metabolic panel  3. Jaw claudication *** - Sedimentation rate - C-reactive protein  4. Blurred vision, bilateral *** - Sedimentation rate - C-reactive protein - Ambulatory referral to Ophthalmology  5. Gait disturbance ***  6. DOE (dyspnea on exertion) *** - Brain natriuretic peptide  7. Diabetes mellitus type 2 in obese (HCC) *** - POCT glucose (manual entry) - insulin glargine (LANTUS SOLOSTAR) 100 UNIT/ML Solostar  Pen; Inject 15 Units into the skin daily.  Dispense: 15 mL; Refill: PRN  8. Chest pain in adult *** - Ambulatory referral to Cardiology    Patient was given the opportunity to ask questions.  Patient verbalized understanding of the plan and was able to repeat key elements of the plan.   This documentation was completed using Radio producer.  Any transcriptional errors are unintentional.  Orders Placed This Encounter  Procedures  . Sedimentation rate  . C-reactive protein  . CBC  . Comprehensive metabolic panel  . Brain natriuretic peptide  . Ambulatory referral to Ophthalmology  . Ambulatory referral to Cardiology  . POCT glucose (manual entry)     Requested Prescriptions   Signed Prescriptions Disp Refills  . insulin glargine (LANTUS SOLOSTAR) 100 UNIT/ML Solostar Pen 15 mL PRN    Sig: Inject 15 Units into the skin daily.    Return in about 2 weeks (around 11/05/2021).  Karle Plumber, MD, FACP

## 2021-10-22 NOTE — Telephone Encounter (Signed)
Called patients daughter Beau Fanny and informed her of Dr. Doristine Devoid recommendations and advice per below. Patients daughter was informed of the urgency of attending appointment today with patients PCP. Patients daughter stated they are on the way to the PCP and verbalized understanding of what was explained to her.

## 2021-10-22 NOTE — Progress Notes (Addendum)
Patient ID: Kristi Campos, female    DOB: [birth year]  MRN: [id number]  CC: Dizziness and Fatigue   Subjective: Kristi Campos is a 71 y.o. female who presents for UC visit 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 Katy Fitch).  Daughter, [name], is with her.   Pt c/o fatigue and dizziness x 1 mth Had reported some dizzines on last visit earlier this mth after fall for which she was seen in ER.  At that time she reported dizziness was not new and had not worsened. -For the past month she has been having dizziness that lasts a few seconds whenever she gets up out of bed.  Dizziness sometimes when walking.  Checks BP every other day and reports levels have been low but does not have log and does not recall readings -Feels fatigue has worsened x2 weeks.  Struggles more than usual to get up and walk.  No further falls.   Reports "tiredness in jaw area" when she chews.  No pain with chewing, no scalp tenderness.  Occasional occipital HA -referred for home PT/ST.  They have been coming out 2x/wk; finding it helpful Shortness of breath with minimal activity.  Endorses some substernal chest pain about 3 times in the past week.  No radiation.  Last episode occurred at rest.  Had normal echo about 1 year ago and negative coronary CT in 2021.  -Also reports blurred vision for past few weeks.  Problems seeing things close up and far away.  Television is blurry.  Has prescription glasses for reading and distance but does not wear them most of the times.  History of glaucoma left eye followed by Dr. Katy Fitch.  I inquired about whether she is on eyedrops.  Her daughter states that she is but patient said Dr. Katy Fitch told her to stop the eyedrops on last visit with him 04/2021.  Next appt 11/2021. Daughter had reached out to pt's neurologist Dr. Carles Collet.  Levo-dopa d/c Dr. Carles Collet also sent me message this a.m  regarding pt's symptoms.  On last visit, we added Lantus insulin 12 units daily to glipizide and Jardiance.  She checks blood sugars in the mornings before breakfast.  Numbers have been in the 200s. Patient Active Problem List   Diagnosis Date Noted   Progressive supranuclear palsy ([facility]) 01/27/2021   AKI (acute kidney injury) ([place]) 10/18/2020   Chest pain 10/17/2020   Primary osteoarthritis, right wrist 02/28/2020   Peripheral arterial disease ([place]) 10/03/2019   Essential hypertension 10/03/2019   Hyperlipidemia 10/03/2019   Diabetes mellitus type 2 in obese ([place]) 11/24/2018   Chronic right-sided low back pain without sciatica 10/24/2018     Current Outpatient Medications on File Prior to Visit  Medication Sig Dispense Refill   aspirin EC 81 MG tablet Take 81 mg by mouth daily.     atorvastatin (LIPITOR) 10 MG tablet Take 1 tablet (10 mg total) by mouth daily. (Patient taking differently: Take 10 mg by mouth at bedtime.) 90 tablet 1   Blood Glucose Monitoring Suppl (ACCU-CHEK GUIDE) w/Device KIT      Continuous Blood Gluc Receiver (FREESTYLE LIBRE READER) DEVI UAD 1 each 0   Continuous Blood Gluc Sensor (FREESTYLE LIBRE SENSOR SYSTEM) MISC Change sensor Q 2 wks 2 each 12   docusate sodium (COLACE) 100 MG capsule Take 1 capsule (100 mg total) by mouth every 12 (twelve)  hours. 30 capsule 0   glipiZIDE (GLUCOTROL XL) 10 MG 24 hr tablet Take 1 tablet (10 mg total) by mouth every morning. 30 tablet 4   Insulin Pen Needle (PEN NEEDLES) 31G X 8 MM MISC UAD 100 each 6   JARDIANCE 25 MG TABS tablet Take 1 tablet (25 mg total) by mouth daily. 30 tablet 3   nitroGLYCERIN (NITROSTAT) 0.4 MG SL tablet Place 1 tablet (0.4 mg total) under the tongue every 5 (five) minutes as needed for chest pain. 20 tablet 0   pantoprazole (PROTONIX) 40 MG tablet TAKE 1 TABLET BY MOUTH DAILY AT 12 NOON 90 tablet 0   tiZANidine (ZANAFLEX) 4 MG tablet Take 4 mg by mouth 3 (three) times daily as needed.      losartan-hydrochlorothiazide (HYZAAR) 100-25 MG tablet TAKE 1 TABLET BY MOUTH DAILY. 90 tablet 3   [DISCONTINUED] simvastatin (ZOCOR) 10 MG tablet Take 10 mg by mouth at bedtime.     No current facility-administered medications on file prior to visit.    Allergies  Allergen Reactions   Metformin And Related Diarrhea    Social History   Socioeconomic History   Marital status: Widowed    Spouse name: Not on file   Number of children: 4   Years of education: Not on file   Highest education level: Not on file  Occupational History    Employer: other   Occupation: retired    Comment: weave for living  Tobacco Use   Smoking status: Never   Smokeless tobacco: Never  Vaping Use   Vaping Use: Never used  Substance and Sexual Activity   Alcohol use: No    Alcohol/week: 0.0 standard drinks of alcohol   Drug use: No   Sexual activity: Not Currently    Partners: Male    Birth control/protection: Post-menopausal  Other Topics Concern   Not on file  Social History Narrative   Right handed    Lives at home alone    Social Determinants of Health   Financial Resource Strain: Not on file  Food Insecurity: Not on file  Transportation Needs: Not on file  Physical Activity: Not on file  Stress: Not on file  Social Connections: Not on file  Intimate Partner Violence: Not on file    Family History  Problem Relation Age of Onset   Bipolar disorder Mother    Hypertension Sister    Hypertension Sister    Hypertension Sister    Hypertension Sister    Hypertension Sister    Hypertension Sister    Hypertension Daughter    Diabetes Son    Prostate cancer Son     Past Surgical History:  Procedure Laterality Date   I & D EXTREMITY Right 08/09/2013   Procedure: MINOR IRRIGATION AND DEBRIDEMENT EXTREMITY;  Surgeon: Cammie Sickle, MD;  Location: [facility];  Service: Orthopedics;  Laterality: Right;  long       wound class 4    ROS: Review of Systems Negative  except as stated above  PHYSICAL EXAM: BP 99/70   Pulse 73   Ht $R'5\' 3"'Ft$  (1.6 m)   Wt 187 lb 4.8 oz (85 kg)   SpO2 99%   BMI 33.18 kg/m   Wt Readings from Last 3 Encounters:  10/22/21 187 lb 4.8 oz (85 kg)  10/01/21 191 lb 12.8 oz (87 kg)  07/23/21 187 lb 9.6 oz (85.1 kg)   Laying: BP 114/74, P70 Sitting: BP 108/71 pulse 72 Standing: BP  106/73, pulse 92  Physical Exam  General appearance - alert, well appearing, older AAF and in no distress Mental status - oriented to person and place Eyes - slightly pale conjunctiva Mouth - oral mucosa moist Neck -no cervical lymphadenopathy.  No thyroid enlargement. Chest - clear to auscultation, no wheezes, rales or rhonchi, symmetric air entry Heart - normal rate, regular rhythm, normal S1, S2, no murmurs, rubs, clicks or gallops.  No JVD Neurological -Dilated eye exam not done.  Extraocular movements intact but noted to be slow   Patient with stuttering and decreased verbal fluency no worse than what was observed on previous visits. No scalp tenderness.  No tenderness on palpation over the temporal arteries. Grip 4+/5 bilaterally.  Power upper extremities 5/5 bilaterally.  Power lower extremities 4+/5 bilaterally proximally and distally. Gait: Stiff and needs hands on assistance with getting on exam table. Ambulates with a rollator [place]ield.  She has freezing of gait intermittently especially with turns.  Turning en bloc.  Low foot to floor clearance. Extremities -no lower extremity edema.       Latest Ref Rng & Units 07/23/2021    9:45 AM 10/18/2020    5:44 AM 10/17/2020    2:32 PM  CMP  Glucose 70 - 99 mg/dL 255  171  229   BUN 8 - 27 mg/dL $Remove'16  16  16   'IsgPPMG$ Creatinine 0.57 - 1.00 mg/dL 1.13  0.93  1.33   Sodium 134 - 144 mmol/L 142  138  138   Potassium 3.5 - 5.2 mmol/L 4.7  3.5  3.8   Chloride 96 - 106 mmol/L 101  106  104   CO2 20 - 29 mmol/L $RemoveB'22  24  24   'MelpRCEP$ Calcium 8.7 - 10.3 mg/dL 10.3  9.2  9.7   Total Protein 6.0 - 8.5 g/dL 7.8  6.8   7.8   Total Bilirubin 0.0 - 1.2 mg/dL 0.4  0.5  0.9   Alkaline Phos 44 - 121 IU/L 93  56  61   AST 0 - 40 IU/L $Remov'18  18  19   'JoRWnw$ ALT 0 - 32 IU/L $Remov'17  13  16    'maqdlv$ Lipid Panel     Component Value Date/Time   CHOL 154 01/27/2021 1134   TRIG 139 01/27/2021 1134   HDL 56 01/27/2021 1134   CHOLHDL 2.8 01/27/2021 1134   LDLCALC 74 01/27/2021 1134    CBC    Component Value Date/Time   WBC 6.5 10/18/2020 0544   RBC 4.13 10/18/2020 0544   HGB 12.7 10/18/2020 0544   HCT 37.9 10/18/2020 0544   PLT 239 10/18/2020 0544   MCV 91.8 10/18/2020 0544   MCH 30.8 10/18/2020 0544   MCHC 33.5 10/18/2020 0544   RDW 14.7 10/18/2020 0544   LYMPHSABS 2.5 10/17/2020 1432   MONOABS 0.5 10/17/2020 1432   EOSABS 0.2 10/17/2020 1432   BASOSABS 0.0 10/17/2020 1432   Results for orders placed or performed in visit on 10/22/21  POCT glucose (manual entry)  Result Value Ref Range   POC Glucose 217 (A) 70 - 99 mg/dl    ASSESSMENT AND PLAN: 1. Fatigue, unspecified type Likely related to underling dx of PSP Will check some baseline blood test as well including CBC - CBC - Comprehensive metabolic panel  2. Dizziness Constellation of symptoms of dizziness, jaw claudication and blurred vision concerning for GCA but again could be due to progression of PSP.   -will check CBC, ESR and CRP before  making decision about further work up/management.  Also history of glaucoma with question of whether she should be on eyedrops or not.  Encourage patient to wear her rxn eyeglasses consistently. Rec getting back in with Dr. Katy Fitch ASAP for eye exam. Orthostatic changes on exam with pulse.  Advise to go slow with position changes. Stay hydrated Advised to cut dose of Cozaar/HCTZ 100/25 mg in half.  This means she will take 1/2 pill daily which will be 50/12.5 mg - CBC - Comprehensive metabolic panel  3. Jaw claudication See #2 above - Sedimentation rate - C-reactive protein  4. Blurred vision, bilateral See #2  above - Sedimentation rate - C-reactive protein - Ambulatory referral to Ophthalmology  5. Gait disturbance Due to her underlying neurologic dx Currently receiving home P.T  6. DOE (dyspnea on exertion) - Brain natriuretic peptide  7. Diabetes mellitus type 2 in obese [facility]) Not at goal Increase Lantus to 15 units daily.  Continue to monitor BS - POCT glucose (manual entry) - insulin glargine (LANTUS SOLOSTAR) 100 UNIT/ML Solostar Pen; Inject 15 Units into the skin daily.  Dispense: 15 mL; Refill: PRN  8. Chest pain in adult - Ambulatory referral to Cardiology    Patient was given the opportunity to ask questions.  Patient verbalized understanding of the plan and was able to repeat key elements of the plan.   This documentation was completed using Radio producer.  Any transcriptional errors are unintentional.  Orders Placed This Encounter  Procedures   Sedimentation rate   C-reactive protein   CBC   Comprehensive metabolic panel   Brain natriuretic peptide   Ambulatory referral to Ophthalmology   Ambulatory referral to Cardiology   POCT glucose (manual entry)     Requested Prescriptions   Signed Prescriptions Disp Refills   insulin glargine (LANTUS SOLOSTAR) 100 UNIT/ML Solostar Pen 15 mL PRN    Sig: Inject 15 Units into the skin daily.    Return in about 2 weeks (around 11/05/2021).  Karle Plumber, MD, FACP

## 2021-10-22 NOTE — Patient Instructions (Addendum)
Increase your insulin to 15 units at bedtime.  Continue to check blood sugars and record the readings or bring your glucometer with you to see the clinical pharmacist in follow-up in 2 weeks.  Please call Dr. Zenia Resides office and try to get in as soon as possible to have your vision evaluated.  Please use your glasses.  Go slow with position changes.  Make sure that you are drinking several glasses of water daily to keep hydrated.

## 2021-10-22 NOTE — Progress Notes (Signed)
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  Understands that there is an increased risk of falls, aspiration as subsequent morbidity and mortality from this disease compared to Parkinson's disease.  -They understand that oftentimes levodopa does not help in this state, but she thinks helping so we will continue carbidopa/levodopa tid.  -Discussed MBE, but she really is asymptomatic right now so we decided to hold off on that.  -Patient does not drive.  She does live by herself, but daughter lives next door.  -discussed with patient that needs to use [place] instead of cane  -Her daughter asked me about using a scooter.  This is generally not advisable in this disease, primarily because patients have difficulty mounting a scooter.  I think this could certainly be the case in her.  We discussed an electric wheelchair and they are interested in pursuing this.  We will plan on doing that evaluation at her upcoming visit in October.  2.  Eyelid opening apraxia  -Not interested in Botox right now.  3.  Vision change/generalized fatigue jaw claudication  -I have spoken with the patient's ophthalmologist and he only wanted to see her if her sed rate was significantly elevated and/or if it was unilateral vision change.  Neither was the case today.  -Her vision issues really sound most consistent with PSP.  She has diplopia and bilateral blurry vision (which is likely a combination of cataracts as well as PSP issues).  She is having no unilateral vision change.  -Given the jaw claudication, however, I do think that there is reason for concern.  We talked about this today in detail with the patient and her daughter.  We talked about temporal arteritis and its risks.  Her CRP is normal, however, which does give some degree of reassurance.  We talked about starting prednisone and doing a biopsy.  Ultimately, however, the patient declined stating  that the jaw claudication has actually been getting better.  She understood the risks (both of therapy and of choosing no therapy -including blindness -and decided to hold off on doing anything right now).  I discussed the case later with her primary care physician.  She is also going to follow-up with the patient and her daughter.  4.  Dizziness  -This is likely the result of orthostasis/dysautonomia from #1.  Primary care just cut her blood pressure medication in half as of yesterday.  We discussed the concept of permissive hypertension.   Subjective:   Kristi Campos was seen today in emergent follow-up today.  Daughter called last week to state that patient had dizziness and trouble walking.  She also said she was having overall fatigue, but mentioned especially when eating and chewing.  We told her to stop the levodopa, but called them back for clarification to make sure it was not jaw fatigue.  We called multiple times, but had trouble reaching them for days.  When we finally got a hold of them, her daughter stated that it was trouble chewing and trouble with jaw fatigue.  She also stated that they had a follow-up appointment with primary care to discuss.  I immediately contacted the primary care physician regarding my concern for jaw claudication.  Patient saw the primary care physician yesterday, August 24, and had lab work done.  Unfortunately, those lab results are not yet back.  I did get a message from her primary care physician that the patient had stopped her  glaucoma drops and she was concerned that the vision change could be from that.  Primary care noted that there was no superficial temporal artery tenderness and no headache.  She was told to follow-up with Dr. Katy Fitch.  The patient's daughter did call him, but they were told that her vision change was likely from cataracts based on the examination in February and they would see her in follow-up in October.  I ended up calling Dr. Zenia Resides  office myself and admitted that I had not yet seen the patient, but was concerned because of presenting complaints of jaw claudication (at least that is what they had noted on the phone).  Discussed that at the time I was not sure if it was unilateral vision change.  I was told to call back today if her sed rate was significantly elevated, and also told to have her primary care start prednisone if it was elevated.  Pt been off of levodopa x 2 days.  Has not noticed a difference with dizziness being off of levodopa.  Primary care did decrease her blood pressure medication yesterday because of the dizziness.  Describes her fatigue as generalized in nature.  Ask about scooter.  Trouble pushing [place].  Blurry vision in both eyes.  If closes an eye it is better.  Occ headache but that is not a primary feature.  She does have diplopia.  She has horizontal diplopia.  No jaw pain but does get tired with chewing food x weeks.  That is getting better (definitely not getting worse).    Current prescribed movement disorder medications: Carbidopa/levodopa 25/100, 1 tablet 3 times per day.    ALLERGIES:   Allergies  Allergen Reactions   Metformin And Related Diarrhea    CURRENT MEDICATIONS:  Outpatient Encounter Medications as of 10/23/2021  Medication Sig   aspirin EC 81 MG tablet Take 81 mg by mouth daily.   atorvastatin (LIPITOR) 10 MG tablet Take 1 tablet (10 mg total) by mouth daily. (Patient taking differently: Take 10 mg by mouth at bedtime.)   Blood Glucose Monitoring Suppl (ACCU-CHEK GUIDE) w/Device KIT    Continuous Blood Gluc Receiver (FREESTYLE LIBRE READER) DEVI UAD   Continuous Blood Gluc Sensor (FREESTYLE LIBRE SENSOR SYSTEM) MISC Change sensor Q 2 wks   glipiZIDE (GLUCOTROL XL) 10 MG 24 hr tablet Take 1 tablet (10 mg total) by mouth every morning.   insulin glargine (LANTUS SOLOSTAR) 100 UNIT/ML Solostar Pen Inject 15 Units into the skin daily.   Insulin Pen Needle (PEN NEEDLES) 31G X 8 MM  MISC UAD   JARDIANCE 25 MG TABS tablet Take 1 tablet (25 mg total) by mouth daily.   losartan-hydrochlorothiazide (HYZAAR) 100-25 MG tablet TAKE 1 TABLET BY MOUTH DAILY.   nitroGLYCERIN (NITROSTAT) 0.4 MG SL tablet Place 1 tablet (0.4 mg total) under the tongue every 5 (five) minutes as needed for chest pain.   pantoprazole (PROTONIX) 40 MG tablet TAKE 1 TABLET BY MOUTH DAILY AT 12 NOON   tiZANidine (ZANAFLEX) 4 MG tablet Take 4 mg by mouth 3 (three) times daily as needed.   docusate sodium (COLACE) 100 MG capsule Take 1 capsule (100 mg total) by mouth every 12 (twelve) hours. (Patient not taking: Reported on 10/23/2021)   [DISCONTINUED] simvastatin (ZOCOR) 10 MG tablet Take 10 mg by mouth at bedtime.   No facility-administered encounter medications on file as of 10/23/2021.    Objective:   PHYSICAL EXAMINATION:    VITALS:   Vitals:   10/23/21 6701  BP: 111/75  Pulse: 84  SpO2: 99%  Weight: 187 lb (84.8 kg)  Height: $Remove'5\' 3"'beXgFkP$  (1.6 m)     GEN:  The patient appears stated age and is in NAD. HEENT:  Normocephalic, atraumatic.  The mucous membranes are moist. The superficial temporal arteries are without ropiness or tenderness.  No STA artery bruits.  No jaw tenderness with opening/closing or palpation CV  RRR Lungs:  CTAB Neck:  no bruits   Neurological examination:  Orientation: The patient is alert and oriented x3.  Cranial nerves: There is good facial symmetry. There is facial hypomimia.  Extraocular muscles are intact.  Downgaze was just slightly decreased. No significant square wave jerks.  The visual fields are full to confrontational testing. The speech is fluent and dysarthric. some stuttering (same as previous).  She has trouble with the gutteral sounds.  Soft palate rises symmetrically and there is no tongue deviation. Hearing is intact to conversational tone. Sensation: Sensation is intact to light and pinprick throughout (facial, trunk, extremities). Vibration is intact at  the bilateral big toe. There is no extinction with double simultaneous stimulation. There is no sensory dermatomal level identified. Motor: Strength is 5/5 in the bilateral upper and lower extremities.   Shoulder shrug is equal and symmetric.  There is no pronator drift.    Movement examination: Tone: There is normal tone in the bilateral upper extremities.  The tone in the lower extremities is normal.  Abnormal movements: None Coordination:  There is minimal decremation with RAM's in the upper extremities, but very significant decremation in the bilateral lower extremities. Gait and Station: The patient has difficulty arising out of a deep-seated chair without the use of the hands.  She has start hesitation.  Gait is shuffling.  She walks on the toes, especially on the right.  She turns en bloc and freezes in the turn.  The R foot sticks to the floor in the turn.  This is the same as last visit.  I have reviewed and interpreted the following labs independently    Chemistry      Component Value Date/Time   NA 140 10/22/2021 1134   K 4.3 10/22/2021 1134   CL 100 10/22/2021 1134   CO2 20 10/22/2021 1134   BUN 21 10/22/2021 1134   CREATININE 1.22 (H) 10/22/2021 1134      Component Value Date/Time   CALCIUM 10.3 10/22/2021 1134   ALKPHOS 87 10/22/2021 1134   AST 17 10/22/2021 1134   ALT 18 10/22/2021 1134   BILITOT 0.3 10/22/2021 1134       Lab Results  Component Value Date   WBC 7.3 10/22/2021   HGB 13.9 10/22/2021   HCT 41.8 10/22/2021   MCV 92 10/22/2021   PLT 277 10/22/2021    Lab Results  Component Value Date   TSH 0.984 07/23/2021   Lab Results  Component Value Date   ESRSEDRATE 73 (H) 10/22/2021   Lab Results  Component Value Date   CRP 4 10/22/2021     Total time spent on today's visit was 75 minutes, including both face-to-face time and nonface-to-face time.  Time included that spent on review of records (prior notes available to me/labs/imaging if  pertinent), discussing treatment and goals, answering patient's questions and coordinating care.  Cc:  Ladell Pier, MD

## 2021-10-22 NOTE — Telephone Encounter (Signed)
Per Dr. Carles Collet:  Labs that need to be done today: Sed Rate & CBC w/ diff & CMP Dx: Temporal arteritis, Jaw Claudification. Patient has done bloodwork today.  2. Have you called Dr. Schuyler Amor? Did call the doctor. Probably due to cataracts. Will follow up with her in October. She informed them j  3. Dr. Carles Collet would like to see patient at 7:45 am tomorrow 10/22/2021. Patients daughter aware of appt and will be here with patient.   4. Did the [facility] doctor order any medications? Any imaging? No. Cut BP blood pressure in half and up dose the insulin.  Blood work was done. (Can see in Epic).    [place] [phone].

## 2021-10-22 NOTE — Telephone Encounter (Signed)
PCP note not complete.  Labs ordered.  No steroids ordered but says that she had vision change and to f/u with Dr. Katy Fitch.  Our MA called daughter and they called Dr. Patrici Ranks office and they assessed her in Feb and thought vision change was due to cataracts and would f/u with her in October.  I spoke with Dr. Katy Fitch and told her honestly I had not seen the patient but I was concerned as daughter mentioned jaw claudication and now I am seeing vision change.  Said that I see pcp ordered ESR and cbc but results pending.  He said if positive PCP could order steroids and set up biopsy but we could call him and he could examine her as well.  This is a bit out of my area of expertise as movement specialist but I am going to work patient into the clinic to be seen at 7:45 am tomorrow.  Daughter aware and agreeable.

## 2021-10-23 ENCOUNTER — Encounter: Payer: Self-pay | Admitting: Neurology

## 2021-10-23 ENCOUNTER — Ambulatory Visit (INDEPENDENT_AMBULATORY_CARE_PROVIDER_SITE_OTHER): Payer: Medicare Other | Admitting: Neurology

## 2021-10-23 VITALS — BP 111/75 | HR 84 | Ht 63.0 in | Wt 187.0 lb

## 2021-10-23 DIAGNOSIS — M2669 Other specified disorders of temporomandibular joint: Secondary | ICD-10-CM

## 2021-10-23 DIAGNOSIS — G231 Progressive supranuclear ophthalmoplegia [Steele-Richardson-Olszewski]: Secondary | ICD-10-CM

## 2021-10-23 DIAGNOSIS — R42 Dizziness and giddiness: Secondary | ICD-10-CM | POA: Diagnosis not present

## 2021-10-23 DIAGNOSIS — R482 Apraxia: Secondary | ICD-10-CM

## 2021-10-23 LAB — CBC
Hematocrit: 41.8 % (ref 34.0–46.6)
Hemoglobin: 13.9 g/dL (ref 11.1–15.9)
MCH: 30.4 pg (ref 26.6–33.0)
MCHC: 33.3 g/dL (ref 31.5–35.7)
MCV: 92 fL (ref 79–97)
Platelets: 277 10*3/uL (ref 150–450)
RBC: 4.57 x10E6/uL (ref 3.77–5.28)
RDW: 13.9 % (ref 11.7–15.4)
WBC: 7.3 10*3/uL (ref 3.4–10.8)

## 2021-10-23 LAB — COMPREHENSIVE METABOLIC PANEL
ALT: 18 IU/L (ref 0–32)
AST: 17 IU/L (ref 0–40)
Albumin/Globulin Ratio: 1.3 (ref 1.2–2.2)
Albumin: 4.5 g/dL (ref 3.8–4.8)
Alkaline Phosphatase: 87 IU/L (ref 44–121)
BUN/Creatinine Ratio: 17 (ref 12–28)
BUN: 21 mg/dL (ref 8–27)
Bilirubin Total: 0.3 mg/dL (ref 0.0–1.2)
CO2: 20 mmol/L (ref 20–29)
Calcium: 10.3 mg/dL (ref 8.7–10.3)
Chloride: 100 mmol/L (ref 96–106)
Creatinine, Ser: 1.22 mg/dL — ABNORMAL HIGH (ref 0.57–1.00)
Globulin, Total: 3.5 g/dL (ref 1.5–4.5)
Glucose: 179 mg/dL — ABNORMAL HIGH (ref 70–99)
Potassium: 4.3 mmol/L (ref 3.5–5.2)
Sodium: 140 mmol/L (ref 134–144)
Total Protein: 8 g/dL (ref 6.0–8.5)
eGFR: 47 mL/min/{1.73_m2} — ABNORMAL LOW (ref >59)

## 2021-10-23 LAB — BRAIN NATRIURETIC PEPTIDE: BNP: 3.1 pg/mL (ref 0.0–100.0)

## 2021-10-23 LAB — SEDIMENTATION RATE: Sed Rate: 73 mm/hr — ABNORMAL HIGH (ref 0–40)

## 2021-10-23 LAB — C-REACTIVE PROTEIN: CRP: 4 mg/L (ref 0–10)

## 2021-10-23 NOTE — Patient Instructions (Signed)
Good to see you!   Let me know if you decide to do the Medicare Wheelchair evaluation at the October visit.  If your jaw gets worse, you need to let your primary care doc know asap as you decided to hold on treatment for now.  The physicians and staff at [facility] are committed to providing excellent care. You may receive a survey requesting feedback about your experience at our office. We strive to receive "very good" responses to the survey questions. If you feel that your experience would prevent you from giving the office a "very good " response, please contact our office to try to remedy the situation. We may be reached at [phone]. Thank you for taking the time out of your busy day to complete the survey.

## 2021-10-24 ENCOUNTER — Telehealth: Payer: Self-pay | Admitting: Internal Medicine

## 2021-10-24 NOTE — Telephone Encounter (Signed)
Phone call placed to patient this afternoon to go over lab results.  Daughter was not present with her.  Patient advised that I have been in contact with her neurologist Dr. Carles Collet regarding her symptoms.  I have also read her note from where she saw patient yesterday.  Based on my communication with her neurologist, patient had declined temporal artery biopsy and treatment with steroid.  However she felt that over all symptoms less likely to be GCA and that pt reported jaw claudication was getting better.  Patient informed that one of her lab test called ESR was elev which can be indicative of inflammation in the body and blood vessels.  She tells me the same that the jaw fatigue when chewing is getting better.  Still has blurred vision BL.  Her CBC is and CRP both normal which is a bit reassuring but advise that she should consider getting the temporal artery biopsy.  Advise to think about it and let me know if she changes her mind.  I tried to call her daughter as well when I hung up from her and left VMM that I had called.

## 2021-10-26 DIAGNOSIS — R269 Unspecified abnormalities of gait and mobility: Secondary | ICD-10-CM | POA: Diagnosis not present

## 2021-10-26 DIAGNOSIS — G20 Parkinson's disease: Secondary | ICD-10-CM | POA: Diagnosis not present

## 2021-10-26 DIAGNOSIS — E119 Type 2 diabetes mellitus without complications: Secondary | ICD-10-CM | POA: Diagnosis not present

## 2021-10-26 DIAGNOSIS — R296 Repeated falls: Secondary | ICD-10-CM | POA: Diagnosis not present

## 2021-10-26 DIAGNOSIS — R4782 Fluency disorder in conditions classified elsewhere: Secondary | ICD-10-CM | POA: Diagnosis not present

## 2021-10-26 DIAGNOSIS — M6281 Muscle weakness (generalized): Secondary | ICD-10-CM | POA: Diagnosis not present

## 2021-10-26 DIAGNOSIS — E785 Hyperlipidemia, unspecified: Secondary | ICD-10-CM | POA: Diagnosis not present

## 2021-10-26 DIAGNOSIS — I10 Essential (primary) hypertension: Secondary | ICD-10-CM | POA: Diagnosis not present

## 2021-10-27 DIAGNOSIS — M6281 Muscle weakness (generalized): Secondary | ICD-10-CM | POA: Diagnosis not present

## 2021-10-27 DIAGNOSIS — I10 Essential (primary) hypertension: Secondary | ICD-10-CM | POA: Diagnosis not present

## 2021-10-27 DIAGNOSIS — G20 Parkinson's disease: Secondary | ICD-10-CM | POA: Diagnosis not present

## 2021-10-27 DIAGNOSIS — E785 Hyperlipidemia, unspecified: Secondary | ICD-10-CM | POA: Diagnosis not present

## 2021-10-27 DIAGNOSIS — R269 Unspecified abnormalities of gait and mobility: Secondary | ICD-10-CM | POA: Diagnosis not present

## 2021-10-27 DIAGNOSIS — E119 Type 2 diabetes mellitus without complications: Secondary | ICD-10-CM | POA: Diagnosis not present

## 2021-10-27 DIAGNOSIS — R4782 Fluency disorder in conditions classified elsewhere: Secondary | ICD-10-CM | POA: Diagnosis not present

## 2021-10-27 DIAGNOSIS — R296 Repeated falls: Secondary | ICD-10-CM | POA: Diagnosis not present

## 2021-10-29 ENCOUNTER — Telehealth: Payer: Self-pay | Admitting: Internal Medicine

## 2021-10-29 ENCOUNTER — Other Ambulatory Visit: Payer: Self-pay | Admitting: Internal Medicine

## 2021-10-29 ENCOUNTER — Ambulatory Visit: Payer: Medicare Other | Attending: Internal Medicine

## 2021-10-29 DIAGNOSIS — R269 Unspecified abnormalities of gait and mobility: Secondary | ICD-10-CM | POA: Diagnosis not present

## 2021-10-29 DIAGNOSIS — E1169 Type 2 diabetes mellitus with other specified complication: Secondary | ICD-10-CM

## 2021-10-29 DIAGNOSIS — I10 Essential (primary) hypertension: Secondary | ICD-10-CM | POA: Diagnosis not present

## 2021-10-29 DIAGNOSIS — G20 Parkinson's disease: Secondary | ICD-10-CM | POA: Diagnosis not present

## 2021-10-29 DIAGNOSIS — M6281 Muscle weakness (generalized): Secondary | ICD-10-CM | POA: Diagnosis not present

## 2021-10-29 DIAGNOSIS — E119 Type 2 diabetes mellitus without complications: Secondary | ICD-10-CM | POA: Diagnosis not present

## 2021-10-29 DIAGNOSIS — R296 Repeated falls: Secondary | ICD-10-CM | POA: Diagnosis not present

## 2021-10-29 DIAGNOSIS — R4782 Fluency disorder in conditions classified elsewhere: Secondary | ICD-10-CM | POA: Diagnosis not present

## 2021-10-29 DIAGNOSIS — E785 Hyperlipidemia, unspecified: Secondary | ICD-10-CM | POA: Diagnosis not present

## 2021-10-29 NOTE — Telephone Encounter (Signed)
Medication Refill - Medication: Blood Glucose Monitoring Suppl (ACCU-CHEK GUIDE) Test Strips  Has the patient contacted their pharmacy? No.   Preferred Pharmacy (with phone number or street name):  [facility] [place], [place] Phone:  [phone]  Fax:  [phone]     Has the patient been seen for an appointment in the last year OR does the patient have an upcoming appointment? Yes.

## 2021-10-29 NOTE — Telephone Encounter (Signed)
Copied from [place] [phone]. Topic: General - Inquiry >> Oct 29, 2021  9:44 AM Erskine Squibb wrote: Reason for CRM: The patients daughter, Beau Fanny called in to let the provider know that the patient wants to go ahead and get the biopsy done. Please assist patient further.

## 2021-10-30 DIAGNOSIS — R4782 Fluency disorder in conditions classified elsewhere: Secondary | ICD-10-CM | POA: Diagnosis not present

## 2021-10-30 DIAGNOSIS — R269 Unspecified abnormalities of gait and mobility: Secondary | ICD-10-CM | POA: Diagnosis not present

## 2021-10-30 DIAGNOSIS — M6281 Muscle weakness (generalized): Secondary | ICD-10-CM | POA: Diagnosis not present

## 2021-10-30 DIAGNOSIS — E785 Hyperlipidemia, unspecified: Secondary | ICD-10-CM | POA: Diagnosis not present

## 2021-10-30 DIAGNOSIS — R296 Repeated falls: Secondary | ICD-10-CM | POA: Diagnosis not present

## 2021-10-30 DIAGNOSIS — E119 Type 2 diabetes mellitus without complications: Secondary | ICD-10-CM | POA: Diagnosis not present

## 2021-10-30 DIAGNOSIS — I10 Essential (primary) hypertension: Secondary | ICD-10-CM | POA: Diagnosis not present

## 2021-10-30 DIAGNOSIS — G20 Parkinson's disease: Secondary | ICD-10-CM | POA: Diagnosis not present

## 2021-10-30 MED ORDER — ACCU-CHEK GUIDE W/DEVICE KIT: PACK | 0 refills | Status: AC

## 2021-10-30 NOTE — Telephone Encounter (Signed)
FYI/Update. ----DD,RMA

## 2021-10-30 NOTE — Telephone Encounter (Signed)
Requested medication (s) are due for refill today: yes  Requested medication (s) are on the active medication list:yes  Last refill:  11/06/20  Future visit scheduled: yes  Notes to clinic:  Unable to refill per protocol, last refill by another provider, historical medication     Requested Prescriptions  Pending Prescriptions Disp Refills   Blood Glucose Monitoring Suppl (ACCU-CHEK GUIDE) w/Device KIT       Endocrinology: Diabetes - Testing Supplies Passed - 10/29/2021  1:52 PM      Passed - Valid encounter within last 12 months    Recent Outpatient Visits           1 week ago Fatigue, unspecified type   [place], Deborah B, MD   4 weeks ago Recurrent falls   [place], MD   3 months ago Diabetes mellitus type 2 in obese [facility])   [place], MD   6 months ago Diabetes mellitus type 2 in obese [facility])   [place], RPH-CPP   7 months ago Encounter for [employer] annual wellness exam   [place] Ladell Pier, MD       Future Appointments             In 1 week Daisy Blossom, Jarome Matin, [place]   In 3 weeks Ladell Pier, MD [place]   In 1 month Wynetta Emery, Dalbert Batman, MD [place]

## 2021-11-02 ENCOUNTER — Telehealth: Payer: Self-pay | Admitting: Internal Medicine

## 2021-11-02 DIAGNOSIS — R4782 Fluency disorder in conditions classified elsewhere: Secondary | ICD-10-CM | POA: Diagnosis not present

## 2021-11-02 DIAGNOSIS — G20 Parkinson's disease: Secondary | ICD-10-CM | POA: Diagnosis not present

## 2021-11-02 DIAGNOSIS — I10 Essential (primary) hypertension: Secondary | ICD-10-CM | POA: Diagnosis not present

## 2021-11-02 DIAGNOSIS — E785 Hyperlipidemia, unspecified: Secondary | ICD-10-CM | POA: Diagnosis not present

## 2021-11-02 DIAGNOSIS — M2669 Other specified disorders of temporomandibular joint: Secondary | ICD-10-CM

## 2021-11-02 DIAGNOSIS — E119 Type 2 diabetes mellitus without complications: Secondary | ICD-10-CM | POA: Diagnosis not present

## 2021-11-02 DIAGNOSIS — R269 Unspecified abnormalities of gait and mobility: Secondary | ICD-10-CM | POA: Diagnosis not present

## 2021-11-02 DIAGNOSIS — R296 Repeated falls: Secondary | ICD-10-CM | POA: Diagnosis not present

## 2021-11-02 DIAGNOSIS — H538 Other visual disturbances: Secondary | ICD-10-CM

## 2021-11-02 DIAGNOSIS — M6281 Muscle weakness (generalized): Secondary | ICD-10-CM | POA: Diagnosis not present

## 2021-11-02 NOTE — Telephone Encounter (Signed)
Phone call placed to patient's daughter Elwin Sleight this morning.  I left a message letting her know that I received her message and wanted to touch base with her briefly to see whether her mother's symptoms had improved before scheduling the temporal artery biopsy.  I will try to call her again later today or tomorrow.

## 2021-11-02 NOTE — Telephone Encounter (Signed)
Phone call placed to patient's daughter this morning. Informed her that I received a message on Friday informing me that patient would like to move forward with getting the temporal artery biopsies done.  I inquired whether patient's symptoms have improved.  She tells me that the patient states that the jaw fatigue when eating is a little better but daughter thinks it is still the same.  Blurred vision still the same.  Daughter did reach out to Dr. Zenia Resides office and was told that the blurred vision may be due to the fact that she has cataracts.  I inquired whether she asked Dr. Katy Fitch if patient was still supposed to be using the eyedrops for glaucoma.  She said she did not, she forgot to do so. I told her that we will go ahead and schedule the biopsy with one of the vascular surgeons as soon as possible.  Should be called this week.  Advised to have her mother hold the aspirin.

## 2021-11-03 DIAGNOSIS — R296 Repeated falls: Secondary | ICD-10-CM | POA: Diagnosis not present

## 2021-11-03 DIAGNOSIS — R4782 Fluency disorder in conditions classified elsewhere: Secondary | ICD-10-CM | POA: Diagnosis not present

## 2021-11-03 DIAGNOSIS — E785 Hyperlipidemia, unspecified: Secondary | ICD-10-CM | POA: Diagnosis not present

## 2021-11-03 DIAGNOSIS — G20 Parkinson's disease: Secondary | ICD-10-CM | POA: Diagnosis not present

## 2021-11-03 DIAGNOSIS — R269 Unspecified abnormalities of gait and mobility: Secondary | ICD-10-CM | POA: Diagnosis not present

## 2021-11-03 DIAGNOSIS — M6281 Muscle weakness (generalized): Secondary | ICD-10-CM | POA: Diagnosis not present

## 2021-11-03 DIAGNOSIS — E119 Type 2 diabetes mellitus without complications: Secondary | ICD-10-CM | POA: Diagnosis not present

## 2021-11-03 DIAGNOSIS — I10 Essential (primary) hypertension: Secondary | ICD-10-CM | POA: Diagnosis not present

## 2021-11-04 DIAGNOSIS — R269 Unspecified abnormalities of gait and mobility: Secondary | ICD-10-CM | POA: Diagnosis not present

## 2021-11-04 DIAGNOSIS — M6281 Muscle weakness (generalized): Secondary | ICD-10-CM | POA: Diagnosis not present

## 2021-11-04 DIAGNOSIS — I10 Essential (primary) hypertension: Secondary | ICD-10-CM | POA: Diagnosis not present

## 2021-11-04 DIAGNOSIS — R296 Repeated falls: Secondary | ICD-10-CM | POA: Diagnosis not present

## 2021-11-04 DIAGNOSIS — G20 Parkinson's disease: Secondary | ICD-10-CM | POA: Diagnosis not present

## 2021-11-04 DIAGNOSIS — E785 Hyperlipidemia, unspecified: Secondary | ICD-10-CM | POA: Diagnosis not present

## 2021-11-04 DIAGNOSIS — R4782 Fluency disorder in conditions classified elsewhere: Secondary | ICD-10-CM | POA: Diagnosis not present

## 2021-11-04 DIAGNOSIS — E119 Type 2 diabetes mellitus without complications: Secondary | ICD-10-CM | POA: Diagnosis not present

## 2021-11-05 DIAGNOSIS — G20 Parkinson's disease: Secondary | ICD-10-CM | POA: Diagnosis not present

## 2021-11-05 DIAGNOSIS — M6281 Muscle weakness (generalized): Secondary | ICD-10-CM | POA: Diagnosis not present

## 2021-11-05 DIAGNOSIS — R296 Repeated falls: Secondary | ICD-10-CM | POA: Diagnosis not present

## 2021-11-05 DIAGNOSIS — R4782 Fluency disorder in conditions classified elsewhere: Secondary | ICD-10-CM | POA: Diagnosis not present

## 2021-11-05 DIAGNOSIS — E785 Hyperlipidemia, unspecified: Secondary | ICD-10-CM | POA: Diagnosis not present

## 2021-11-05 DIAGNOSIS — E119 Type 2 diabetes mellitus without complications: Secondary | ICD-10-CM | POA: Diagnosis not present

## 2021-11-05 DIAGNOSIS — R269 Unspecified abnormalities of gait and mobility: Secondary | ICD-10-CM | POA: Diagnosis not present

## 2021-11-05 DIAGNOSIS — I10 Essential (primary) hypertension: Secondary | ICD-10-CM | POA: Diagnosis not present

## 2021-11-09 ENCOUNTER — Ambulatory Visit: Payer: Medicare Other | Attending: Internal Medicine | Admitting: Pharmacist

## 2021-11-09 DIAGNOSIS — G20 Parkinson's disease: Secondary | ICD-10-CM | POA: Diagnosis not present

## 2021-11-09 DIAGNOSIS — E119 Type 2 diabetes mellitus without complications: Secondary | ICD-10-CM | POA: Diagnosis not present

## 2021-11-09 DIAGNOSIS — E785 Hyperlipidemia, unspecified: Secondary | ICD-10-CM | POA: Diagnosis not present

## 2021-11-09 DIAGNOSIS — M6281 Muscle weakness (generalized): Secondary | ICD-10-CM | POA: Diagnosis not present

## 2021-11-09 DIAGNOSIS — R4782 Fluency disorder in conditions classified elsewhere: Secondary | ICD-10-CM | POA: Diagnosis not present

## 2021-11-09 DIAGNOSIS — E1169 Type 2 diabetes mellitus with other specified complication: Secondary | ICD-10-CM

## 2021-11-09 DIAGNOSIS — E669 Obesity, unspecified: Secondary | ICD-10-CM | POA: Diagnosis not present

## 2021-11-09 DIAGNOSIS — R269 Unspecified abnormalities of gait and mobility: Secondary | ICD-10-CM | POA: Diagnosis not present

## 2021-11-09 DIAGNOSIS — I10 Essential (primary) hypertension: Secondary | ICD-10-CM | POA: Diagnosis not present

## 2021-11-09 DIAGNOSIS — R296 Repeated falls: Secondary | ICD-10-CM | POA: Diagnosis not present

## 2021-11-09 MED ORDER — OZEMPIC (0.25 OR 0.5 MG/DOSE) 2 MG/3ML ~~LOC~~ SOPN: PEN_INJECTOR | SUBCUTANEOUS | 2 refills | Status: DC

## 2021-11-09 MED ORDER — JARDIANCE 25 MG PO TABS: 25.0000 mg | ORAL_TABLET | Freq: Every day | ORAL | 3 refills | Status: DC

## 2021-11-09 MED ORDER — ACCU-CHEK GUIDE VI STRP: ORAL_STRIP | 2 refills | Status: DC

## 2021-11-09 NOTE — Progress Notes (Unsigned)
[facility] OF [place]  ASSESSMENT / PLAN: 71 y.o. female with possible temporal arteritis. Plan temporal artery biopsy in OR.   CHIEF COMPLAINT: ***  HISTORY OF PRESENT ILLNESS: Kristi Campos is a 71 y.o. female ***  VASCULAR SURGICAL HISTORY: ***  VASCULAR RISK FACTORS: {FINDINGS; POSITIVE NEGATIVE:[phone]} history of stroke / transient ischemic attack. {FINDINGS; POSITIVE NEGATIVE:[phone]} history of coronary artery disease. *** history of PCI. *** history of CABG.  {FINDINGS; POSITIVE NEGATIVE:[phone]} history of diabetes mellitus. Last A1c ***. {FINDINGS; POSITIVE NEGATIVE:[phone]} history of smoking. *** actively smoking. {FINDINGS; POSITIVE NEGATIVE:[id number]} history of hypertension. *** drug regimen with *** control. {FINDINGS; POSITIVE NEGATIVE:[phone]} history of chronic kidney disease.  Last GFR ***. CKD {stage:30421363}. {FINDINGS; POSITIVE NEGATIVE:[phone]} history of chronic obstructive pulmonary disease, treated with ***.  FUNCTIONAL STATUS: ECOG performance status: {findings; ecog performance status:31780} Ambulatory status: {TNHAmbulation:25868}  Past Medical History:  Diagnosis Date   Diabetes mellitus without complication (HCC)    Fibroid uterus    Hypertension    Vaginal Pap smear, abnormal     Past Surgical History:  Procedure Laterality Date   I & D EXTREMITY Right 08/09/2013   Procedure: MINOR IRRIGATION AND DEBRIDEMENT EXTREMITY;  Surgeon: Wyn Forster, MD;  Location: [facility];  Service: Orthopedics;  Laterality: Right;  long       wound class 4    Family History  Problem Relation Age of Onset   Bipolar disorder Mother    Hypertension Sister    Hypertension Sister    Hypertension Sister    Hypertension Sister    Hypertension Sister    Hypertension Sister    Hypertension Daughter    Diabetes Son    Prostate cancer Son     Social History   Socioeconomic History   Marital  status: Widowed    Spouse name: Not on file   Number of children: 4   Years of education: Not on file   Highest education level: Not on file  Occupational History    Employer: other   Occupation: retired    Comment: weave for living  Tobacco Use   Smoking status: Never   Smokeless tobacco: Never  Vaping Use   Vaping Use: Never used  Substance and Sexual Activity   Alcohol use: No    Alcohol/week: 0.0 standard drinks of alcohol   Drug use: No   Sexual activity: Not Currently    Partners: Male    Birth control/protection: Post-menopausal  Other Topics Concern   Not on file  Social History Narrative   Right handed    Lives at home alone ONE LEVEL   Caffeine1 cup daily   Social Determinants of Health   Financial Resource Strain: Not on file  Food Insecurity: Not on file  Transportation Needs: Not on file  Physical Activity: Not on file  Stress: Not on file  Social Connections: Not on file  Intimate Partner Violence: Not on file    Allergies  Allergen Reactions   Metformin And Related Diarrhea    Current Outpatient Medications  Medication Sig Dispense Refill   aspirin EC 81 MG tablet Take 81 mg by mouth daily.     atorvastatin (LIPITOR) 10 MG tablet Take 1 tablet (10 mg total) by mouth daily. (Patient taking differently: Take 10 mg by mouth at bedtime.) 90 tablet 1   Blood Glucose Monitoring Suppl (ACCU-CHEK GUIDE) w/Device KIT Use to check blood sugar TID. Dx E11.69 1 kit 0   Continuous Blood Gluc Receiver (  FREESTYLE LIBRE READER) DEVI UAD 1 each 0   Continuous Blood Gluc Sensor (FREESTYLE LIBRE SENSOR SYSTEM) MISC Change sensor Q 2 wks 2 each 12   docusate sodium (COLACE) 100 MG capsule Take 1 capsule (100 mg total) by mouth every 12 (twelve) hours. (Patient not taking: Reported on 10/23/2021) 30 capsule 0   glipiZIDE (GLUCOTROL XL) 10 MG 24 hr tablet Take 1 tablet (10 mg total) by mouth every morning. 30 tablet 4   glucose blood (ACCU-CHEK GUIDE) test strip Use to  check blood sugar TID. Dx E11.69 100 each 2   insulin glargine (LANTUS SOLOSTAR) 100 UNIT/ML Solostar Pen Inject 15 Units into the skin daily. 15 mL PRN   Insulin Pen Needle (PEN NEEDLES) 31G X 8 MM MISC UAD 100 each 6   JARDIANCE 25 MG TABS tablet Take 1 tablet (25 mg total) by mouth daily. 30 tablet 3   losartan-hydrochlorothiazide (HYZAAR) 100-25 MG tablet TAKE 1 TABLET BY MOUTH DAILY. 90 tablet 3   nitroGLYCERIN (NITROSTAT) 0.4 MG SL tablet Place 1 tablet (0.4 mg total) under the tongue every 5 (five) minutes as needed for chest pain. 20 tablet 0   pantoprazole (PROTONIX) 40 MG tablet TAKE 1 TABLET BY MOUTH DAILY AT 12 NOON 90 tablet 0   Semaglutide,0.25 or 0.5MG /DOS, (OZEMPIC, 0.25 OR 0.5 MG/DOSE,) 2 MG/3ML SOPN Inject 0.25 mg weekly subq once weekly for 4 weeks. Then, increase to 0.5 mg weekly thereafter. 3 mL 2   tiZANidine (ZANAFLEX) 4 MG tablet Take 4 mg by mouth 3 (three) times daily as needed.     No current facility-administered medications for this visit.    PHYSICAL EXAM There were no vitals filed for this visit.  Constitutional: *** appearing. *** distress. Appears *** nourished.  Neurologic: CN ***. *** focal findings. *** sensory loss. Psychiatric: *** Mood and affect symmetric and appropriate. Eyes: *** No icterus. No conjunctival pallor. Ears, nose, throat: *** mucous membranes moist. Midline trachea.  Cardiac: *** rate and rhythm.  Respiratory: *** unlabored. Abdominal: *** soft, non-tender, non-distended.  Peripheral vascular: *** Extremity: *** edema. *** cyanosis. *** pallor.  Skin: *** gangrene. *** ulceration.  Lymphatic: *** Stemmer's sign. *** palpable lymphadenopathy.    PERTINENT LABORATORY AND RADIOLOGIC DATA  Most recent CBC    Latest Ref Rng & Units 10/22/2021   11:34 AM 10/18/2020    5:44 AM 10/17/2020    2:32 PM  CBC  WBC 3.4 - 10.8 x10E3/uL 7.3  6.5  6.1   Hemoglobin 11.1 - 15.9 g/dL 13.2  44.0  10.2   Hematocrit 34.0 - 46.6 % 41.8  37.9  42.5    Platelets 150 - 450 x10E3/uL 277  239  276      Most recent CMP    Latest Ref Rng & Units 10/22/2021   11:34 AM 07/23/2021    9:45 AM 10/18/2020    5:44 AM  CMP  Glucose 70 - 99 mg/dL 725  366  440   BUN 8 - 27 mg/dL 21  16  16    Creatinine 0.57 - 1.00 mg/dL 3.47  4.25  9.56   Sodium 134 - 144 mmol/L 140  142  138   Potassium 3.5 - 5.2 mmol/L 4.3  4.7  3.5   Chloride 96 - 106 mmol/L 100  101  106   CO2 20 - 29 mmol/L 20  22  24    Calcium 8.7 - 10.3 mg/dL 38.7  56.4  9.2   Total Protein 6.0 - 8.5  g/dL 8.0  7.8  6.8   Total Bilirubin 0.0 - 1.2 mg/dL 0.3  0.4  0.5   Alkaline Phos 44 - 121 IU/L 87  93  56   AST 0 - 40 IU/L 17  18  18    ALT 0 - 32 IU/L 18  17  13      Renal function CrCl cannot be calculated (Unknown ideal weight.).  HbA1c, POC (controlled diabetic range) (%)  Date Value  10/01/2021 11.1 (A)    LDL Chol Calc (NIH)  Date Value Ref Range Status  01/27/2021 74 0 - 99 mg/dL Final     Vascular Imaging: ***  Leontine Radman N. Lenell Antu, MD [facility] Phone Number: [phone] 11/09/2021 10:54 AM  Total time spent on preparing this encounter including chart review, data review, collecting history, examining the patient, coordinating care for this {tnhtimebilling:26202}  Portions of this report may have been transcribed using voice recognition software.  Every effort has been made to ensure accuracy; however, inadvertent computerized transcription errors may still be present.

## 2021-11-09 NOTE — Progress Notes (Signed)
    S:     No chief complaint on file.  Kristi Campos is a 71 y.o. female who presents for diabetes evaluation, education, and management.  PMH is significant for HTN, T2DM, PAD, AKI.  Patient was referred and last seen by Primary Care Provider, Dr. Wynetta Emery, on 10/22/2021.   At last visit, Patient reported blood sugar readings of 200s so Lantus was increased from 12 U to 15 U daily.   Today, patient arrives in good spirits and presents without any assistance.  Patient has run of Jardiance for couple of days but remains compliant with other medications.  Patient reports Diabetes is longstanding.   Family/Social History:  -Fhx: HTN -Tobacco: never smoked  Current diabetes medications include: Lantus 15 unites once daily, Glipizide 10 mg daily, Jardiance 25 mg daily.   Patient reports adherence to taking all medications as prescribed.   Patient reports hypoglycemic events. Patient reports dizziness and a blood sugar reading of 60 once.   Reported home fasting blood sugars: 120s-150s  Reported 2 hour post-meal/random blood sugars: 200s.  Patient denies nocturia (nighttime urination).  Patient denies neuropathy (nerve pain). Patient reports visual changes. Patient reports self foot exams.   Patient reported dietary habits:  -Patient doesn't eat a lot of carb, drinks apple and orange juice.   Patient-reported exercise habits: -Patient doesn't have a current exercising regimen.   O:  Lab Results  Component Value Date   HGBA1C 11.1 (A) 10/01/2021   There were no vitals filed for this visit.  Lipid Panel     Component Value Date/Time   CHOL 154 01/27/2021 1134   TRIG 139 01/27/2021 1134   HDL 56 01/27/2021 1134   CHOLHDL 2.8 01/27/2021 1134   LDLCALC 74 01/27/2021 1134   Clinical Atherosclerotic Cardiovascular Disease (ASCVD): No  The 10-year ASCVD risk score (Arnett DK, et al., 2019) is: 17.6%   Values used to calculate the score:     Age: 13 years     Sex:  Female     Is Non-Hispanic African American: Yes     Diabetic: Yes     Tobacco smoker: No     Systolic Blood Pressure: 638 mmHg     Is BP treated: Yes     HDL Cholesterol: 56 mg/dL     Total Cholesterol: 154 mg/dL   A/P: Diabetes longstanding currently uncontrolled. Patient is able to verbalize appropriate hypoglycemia management plan. Medication adherence appears appropriate. -Continued basal insulin Lantus 15U daily. -Started GLP-1 Ozempic 0.25 mg once weekly. Counseled pt to report hypoglycemia. Will need to decrease insulin dose if this occurs.  -Continued SGLT2-I Jardiance 25 mg daily.  -Continued Glipizide 10 mg daily. -Patient educated on purpose, proper use, and potential adverse effects of Ozempic.  -Extensively discussed pathophysiology of diabetes, recommended lifestyle interventions, dietary effects on blood sugar control.  -Counseled on s/sx of and management of hypoglycemia.  -Next A1c anticipated 12/2021.   Written patient instructions provided. Patient verbalized understanding of treatment plan.  Total time in face to face counseling 30 minutes.    Follow-up:  -Follow up with PCP in September 25th.  Patient seen with: Deirdre Evener, PharmD Candidate [employer]  Class of 2025    Pharmacist:  Benard Halsted, PharmD, [place], [place] [phone]

## 2021-11-09 NOTE — H&P (View-Only) (Signed)
[facility] OF [place]  ASSESSMENT / PLAN: 71 y.o. female with possible temporal arteritis. Plan left temporal artery biopsy in OR this week.  She has recently started Ozempic.  This will need to be held for 1 week.   CHIEF COMPLAINT: Jaw discomfort, blurred vision  HISTORY OF PRESENT ILLNESS: Kristi Campos is a 71 y.o. female referred to clinic for temporal artery biopsy.  The patient has an atypical history of temporal arteritis.  She has mild discomfort in her head around the left temple.  She describes blurred vision.  She describes bilateral discomfort in her jaw with chewing.  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  Past Medical History:  Diagnosis Date   Diabetes mellitus without complication ([place])    Fibroid uterus    Hypertension    Vaginal Pap smear, abnormal     Past Surgical History:  Procedure Laterality Date   I & D EXTREMITY Right 08/09/2013   Procedure: MINOR IRRIGATION AND DEBRIDEMENT EXTREMITY;  Surgeon: Cammie Sickle, MD;  Location: [facility];  Service: Orthopedics;  Laterality: Right;  long       wound class 4    Family History  Problem Relation Age of Onset   Bipolar disorder Mother    Hypertension Sister    Hypertension Sister    Hypertension Sister    Hypertension Sister    Hypertension Sister    Hypertension Sister    Hypertension Daughter    Diabetes Son    Prostate cancer Son     Social History   Socioeconomic History   Marital status: Widowed    Spouse name: Not on file   Number of children: 4   Years of education: Not on file   Highest education level: Not on file  Occupational History    Employer: other   Occupation: retired    Comment: weave for living  Tobacco Use   Smoking status: Never   Smokeless tobacco: Never  Vaping Use   Vaping Use: Never used  Substance and Sexual Activity   Alcohol use: No    Alcohol/week: 0.0 standard drinks of alcohol   Drug  use: No   Sexual activity: Not Currently    Partners: Male    Birth control/protection: Post-menopausal  Other Topics Concern   Not on file  Social History Narrative   Right handed    Lives at home alone ONE LEVEL   Caffeine1 cup daily   Social Determinants of Health   Financial Resource Strain: Not on file  Food Insecurity: Not on file  Transportation Needs: Not on file  Physical Activity: Not on file  Stress: Not on file  Social Connections: Not on file  Intimate Partner Violence: Not on file    Allergies  Allergen Reactions   Metformin And Related Diarrhea    Current Outpatient Medications  Medication Sig Dispense Refill   aspirin EC 81 MG tablet Take 81 mg by mouth daily.     atorvastatin (LIPITOR) 10 MG tablet Take 1 tablet (10 mg total) by mouth daily. (Patient taking differently: Take 10 mg by mouth at bedtime.) 90 tablet 1   Blood Glucose Monitoring Suppl (ACCU-CHEK GUIDE) w/Device KIT Use to check blood sugar TID. Dx E11.69 1 kit 0   Continuous Blood Gluc Receiver (FREESTYLE LIBRE READER) DEVI UAD 1 each 0   Continuous Blood Gluc Sensor (FREESTYLE LIBRE SENSOR SYSTEM) MISC Change sensor Q 2 wks 2 each 12   docusate  sodium (COLACE) 100 MG capsule Take 1 capsule (100 mg total) by mouth every 12 (twelve) hours. (Patient not taking: Reported on 10/23/2021) 30 capsule 0   glipiZIDE (GLUCOTROL XL) 10 MG 24 hr tablet Take 1 tablet (10 mg total) by mouth every morning. 30 tablet 4   glucose blood (ACCU-CHEK GUIDE) test strip Use to check blood sugar TID. Dx E11.69 100 each 2   insulin glargine (LANTUS SOLOSTAR) 100 UNIT/ML Solostar Pen Inject 15 Units into the skin daily. 15 mL PRN   Insulin Pen Needle (PEN NEEDLES) 31G X 8 MM MISC UAD 100 each 6   JARDIANCE 25 MG TABS tablet Take 1 tablet (25 mg total) by mouth daily. 30 tablet 3   losartan-hydrochlorothiazide (HYZAAR) 100-25 MG tablet TAKE 1 TABLET BY MOUTH DAILY. 90 tablet 3   nitroGLYCERIN (NITROSTAT) 0.4 MG SL tablet  Place 1 tablet (0.4 mg total) under the tongue every 5 (five) minutes as needed for chest pain. 20 tablet 0   pantoprazole (PROTONIX) 40 MG tablet TAKE 1 TABLET BY MOUTH DAILY AT 12 NOON 90 tablet 0   Semaglutide,0.25 or 0.5MG /DOS, (OZEMPIC, 0.25 OR 0.5 MG/DOSE,) 2 MG/3ML SOPN Inject 0.25 mg weekly subq once weekly for 4 weeks. Then, increase to 0.5 mg weekly thereafter. 3 mL 2   tiZANidine (ZANAFLEX) 4 MG tablet Take 4 mg by mouth 3 (three) times daily as needed.     No current facility-administered medications for this visit.    PHYSICAL EXAM Vitals:   11/10/21 0956  BP: 117/79  Pulse: 76  Resp: 20  Temp: 98.1 F (36.7 C)  SpO2: 98%  Weight: 190 lb (86.2 kg)  Height: 5\' 3"  (1.6 m)    Elderly woman in no acute distress Regular rate and rhythm Unlabored breathing  PERTINENT LABORATORY AND RADIOLOGIC DATA  Most recent CBC    Latest Ref Rng & Units 10/22/2021   11:34 AM 10/18/2020    5:44 AM 10/17/2020    2:32 PM  CBC  WBC 3.4 - 10.8 x10E3/uL 7.3  6.5  6.1   Hemoglobin 11.1 - 15.9 g/dL 13.9  12.7  13.8   Hematocrit 34.0 - 46.6 % 41.8  37.9  42.5   Platelets 150 - 450 x10E3/uL 277  239  276      Most recent CMP    Latest Ref Rng & Units 10/22/2021   11:34 AM 07/23/2021    9:45 AM 10/18/2020    5:44 AM  CMP  Glucose 70 - 99 mg/dL 179  255  171   BUN 8 - 27 mg/dL 21  16  16    Creatinine 0.57 - 1.00 mg/dL 1.22  1.13  0.93   Sodium 134 - 144 mmol/L 140  142  138   Potassium 3.5 - 5.2 mmol/L 4.3  4.7  3.5   Chloride 96 - 106 mmol/L 100  101  106   CO2 20 - 29 mmol/L 20  22  24    Calcium 8.7 - 10.3 mg/dL 10.3  10.3  9.2   Total Protein 6.0 - 8.5 g/dL 8.0  7.8  6.8   Total Bilirubin 0.0 - 1.2 mg/dL 0.3  0.4  0.5   Alkaline Phos 44 - 121 IU/L 87  93  56   AST 0 - 40 IU/L 17  18  18    ALT 0 - 32 IU/L 18  17  13      Renal function CrCl cannot be calculated (Unknown ideal weight.).  HbA1c, POC (controlled diabetic  range) (%)  Date Value  10/01/2021 11.1 (A)    LDL  Chol Calc (NIH)  Date Value Ref Range Status  01/27/2021 74 0 - 99 mg/dL Final     Kristi Aline. Stanford Breed, MD [facility] Phone Number: [phone] 11/09/2021 10:54 AM  Total time spent on preparing this encounter including chart review, data review, collecting history, examining the patient, coordinating care for this new patient, 45 minutes.  Portions of this report may have been transcribed using voice recognition software.  Every effort has been made to ensure accuracy; however, inadvertent computerized transcription errors may still be present.

## 2021-11-10 ENCOUNTER — Encounter: Payer: Self-pay | Admitting: Vascular Surgery

## 2021-11-10 ENCOUNTER — Other Ambulatory Visit: Payer: Self-pay

## 2021-11-10 ENCOUNTER — Ambulatory Visit (INDEPENDENT_AMBULATORY_CARE_PROVIDER_SITE_OTHER): Payer: Medicare Other | Admitting: Vascular Surgery

## 2021-11-10 VITALS — BP 117/79 | HR 76 | Temp 98.1°F | Resp 20 | Ht 63.0 in | Wt 190.0 lb

## 2021-11-10 DIAGNOSIS — R519 Headache, unspecified: Secondary | ICD-10-CM

## 2021-11-10 DIAGNOSIS — M2669 Other specified disorders of temporomandibular joint: Secondary | ICD-10-CM | POA: Diagnosis not present

## 2021-11-11 DIAGNOSIS — R4782 Fluency disorder in conditions classified elsewhere: Secondary | ICD-10-CM | POA: Diagnosis not present

## 2021-11-11 DIAGNOSIS — I10 Essential (primary) hypertension: Secondary | ICD-10-CM | POA: Diagnosis not present

## 2021-11-11 DIAGNOSIS — M6281 Muscle weakness (generalized): Secondary | ICD-10-CM | POA: Diagnosis not present

## 2021-11-11 DIAGNOSIS — R296 Repeated falls: Secondary | ICD-10-CM | POA: Diagnosis not present

## 2021-11-11 DIAGNOSIS — R269 Unspecified abnormalities of gait and mobility: Secondary | ICD-10-CM | POA: Diagnosis not present

## 2021-11-11 DIAGNOSIS — E119 Type 2 diabetes mellitus without complications: Secondary | ICD-10-CM | POA: Diagnosis not present

## 2021-11-11 DIAGNOSIS — E785 Hyperlipidemia, unspecified: Secondary | ICD-10-CM | POA: Diagnosis not present

## 2021-11-11 DIAGNOSIS — G20 Parkinson's disease: Secondary | ICD-10-CM | POA: Diagnosis not present

## 2021-11-12 ENCOUNTER — Ambulatory Visit: Payer: Self-pay

## 2021-11-12 ENCOUNTER — Ambulatory Visit: Payer: Medicare Other | Attending: Nurse Practitioner | Admitting: Nurse Practitioner

## 2021-11-12 ENCOUNTER — Encounter: Payer: Self-pay | Admitting: Nurse Practitioner

## 2021-11-12 VITALS — BP 116/72 | HR 71 | Ht 63.0 in | Wt 190.6 lb

## 2021-11-12 DIAGNOSIS — E119 Type 2 diabetes mellitus without complications: Secondary | ICD-10-CM | POA: Diagnosis not present

## 2021-11-12 DIAGNOSIS — E782 Mixed hyperlipidemia: Secondary | ICD-10-CM | POA: Diagnosis not present

## 2021-11-12 DIAGNOSIS — R0789 Other chest pain: Secondary | ICD-10-CM | POA: Diagnosis not present

## 2021-11-12 DIAGNOSIS — G231 Progressive supranuclear ophthalmoplegia [Steele-Richardson-Olszewski]: Secondary | ICD-10-CM

## 2021-11-12 DIAGNOSIS — I10 Essential (primary) hypertension: Secondary | ICD-10-CM | POA: Diagnosis not present

## 2021-11-12 NOTE — Telephone Encounter (Signed)
    Chief Complaint: Pt. Started Ozempic Tuesday. After lunch her BS - 81. No symptoms. "She feels fine." Symptoms: None Frequency: Today Pertinent Negatives: Patient denies  Disposition: '[]'$ ED /'[]'$ Urgent Care (no appt availability in office) / '[]'$ Appointment(In office/virtual)/ '[]'$  [facility]/ '[]'$ Home Care/ '[]'$ Refused Recommended Disposition /'[]'$ [facility]/ '[x]'$  Follow-up with PCP Additional Notes: Please advise pt.  Answer Assessment - Initial Assessment Questions 1. SYMPTOMS: "What symptoms are you concerned about?"     Low BS 81 2. ONSET:  "When did the symptoms start?"     This week 3. BLOOD GLUCOSE: "What is your blood glucose level?"      112 4. USUAL RANGE: "What is your blood glucose level usually?" (e.g., usual fasting morning value, usual evening value)     112 5. TYPE 1 or 2:  "Do you know what type of diabetes you have?"  (e.g., Type 1, Type 2, Gestational; doesn't know)      Type 1 6. INSULIN: "Do you take insulin?" "What type of insulin(s) do you use? What is the mode of delivery? (syringe, pen; injection or pump) "When did you last give yourself an insulin dose?" (i.e., time or hours/minutes ago) "How much did you give?" (i.e., how many units)     Yes 7. DIABETES PILLS: "Do you take any pills for your diabetes?" If Yes, ask: "What is the name of the medicine(s) that you take for high blood sugar?"     Yes 8. OTHER SYMPTOMS: "Do you have any symptoms?" (e.g., fever, frequent urination, difficulty breathing, vomiting)     No 9. LOW BLOOD GLUCOSE TREATMENT: "What have you done so far to treat the low blood glucose level?"     Had lunch 10. FOOD: "When did you last eat or drink?"       Lunch  11. ALONE: "Are you alone right now or is someone with you?"        Yes 12. PREGNANCY: "Is there any chance you are pregnant?" "When was your last menstrual period?"       No  Protocols used: Diabetes - Low Blood Sugar-A-AH

## 2021-11-12 NOTE — Patient Instructions (Signed)
Medication Instructions:  No Changes *If you need a refill on your cardiac medications before your next appointment, please call your pharmacy*   Lab Work: No Labs If you have labs (blood work) drawn today and your tests are completely normal, you will receive your results only by: MyChart Message (if you have MyChart) OR A paper copy in the mail If you have any lab test that is abnormal or we need to change your treatment, we will call you to review the results.   Testing/Procedures: No Testing   Follow-Up: At [facility], you and your health needs are our priority.  As part of our continuing mission to provide you with exceptional heart care, we have created designated Provider Care Teams.  These Care Teams include your primary Cardiologist (physician) and Advanced Practice Providers (APPs -  Physician Assistants and Nurse Practitioners) who all work together to provide you with the care you need, when you need it.  We recommend signing up for the patient portal called "MyChart".  Sign up information is provided on this After Visit Summary.  MyChart is used to connect with patients for Virtual Visits (Telemedicine).  Patients are able to view lab/test results, encounter notes, upcoming appointments, etc.  Non-urgent messages can be sent to your provider as well.   To learn more about what you can do with MyChart, go to [email or web address].    Your next appointment:   4-6 months  The format for your next appointment:   In Person  Provider:   Jonathan Berry, MD   

## 2021-11-12 NOTE — Progress Notes (Signed)
Office Visit    Patient Name: Kristi Campos Date of Encounter: 11/12/2021  Primary Care Provider:  Ladell Pier, MD Primary Cardiologist:  Quay Burow, MD  Chief Complaint    71 year old female with a history of chest pain, hypertension, hyperlipidemia, type 2 diabetes, progressive supranuclear palsy, and temporal arteritis who presents for follow-up related to chest pain.  Past Medical History    Past Medical History:  Diagnosis Date   Diabetes mellitus without complication ([place])    Fibroid uterus    Hypertension    Vaginal Pap smear, abnormal    Past Surgical History:  Procedure Laterality Date   I & D EXTREMITY Right 08/09/2013   Procedure: MINOR IRRIGATION AND DEBRIDEMENT EXTREMITY;  Surgeon: Cammie Sickle, MD;  Location: [facility];  Service: Orthopedics;  Laterality: Right;  long       wound class 4    Allergies  Allergies  Allergen Reactions   Metformin And Related Diarrhea    History of Present Illness    71 year old female with the above past medical history including chest pain, hypertension, hyperlipidemia, type 2 diabetes, progressive supranuclear palsy, and temporal arteritis.  She was initially referred to Dr. Gwenlyn Found for the evaluation of PAD/claudication.  Lower extremity Dopplers in 10/2019 were normal.  Coronary calcium score in 09/2019 was 0. She was hospitalized in August 2022 in the setting of chest pain. Echocardiogram was normal at the time.  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 Additionally, she has been diagnosed with temporal arteritis and is pending temporal artery biopsy.  She was last seen in the office on 04/22/2021 and was stable from a cardiac standpoint.  She denied any symptoms of chest pain.  She presents today for follow-up accompanied by her daughter. Pt is a difficult historian. Since her last visit she has been  stable overall from a cardiac standpoint.  Over the past month she has noted some mild intermittent chest discomfort which she describes as an aching that lasts for seconds at a time and occurs approximately once a week.  She notes mild dyspnea on exertion.  Overall, she is not very active and mostly ambulates in her home with a rollator.  Other than her intermittent chest discomfort, she reports feeling well denies any additional concerns today.  Home Medications    Current Outpatient Medications  Medication Sig Dispense Refill   aspirin EC 81 MG tablet Take 81 mg by mouth daily.     atorvastatin (LIPITOR) 10 MG tablet Take 1 tablet (10 mg total) by mouth daily. (Patient taking differently: Take 10 mg by mouth at bedtime.) 90 tablet 1   Blood Glucose Monitoring Suppl (ACCU-CHEK GUIDE) w/Device KIT Use to check blood sugar TID. Dx E11.69 1 kit 0   Continuous Blood Gluc Receiver (FREESTYLE LIBRE READER) DEVI UAD 1 each 0   Continuous Blood Gluc Sensor (FREESTYLE LIBRE SENSOR SYSTEM) MISC Change sensor Q 2 wks 2 each 12   docusate sodium (COLACE) 100 MG capsule Take 1 capsule (100 mg total) by mouth every 12 (twelve) hours. 30 capsule 0   glipiZIDE (GLUCOTROL XL) 10 MG 24 hr tablet Take 1 tablet (10 mg total) by mouth every morning. 30 tablet 4   glucose blood (ACCU-CHEK GUIDE) test strip Use to check blood sugar TID. Dx E11.69 100 each 2   insulin glargine (LANTUS SOLOSTAR) 100 UNIT/ML Solostar Pen Inject 15 Units into the skin daily.  15 mL PRN   Insulin Pen Needle (PEN NEEDLES) 31G X 8 MM MISC UAD 100 each 6   JARDIANCE 25 MG TABS tablet Take 1 tablet (25 mg total) by mouth daily. 30 tablet 3   nitroGLYCERIN (NITROSTAT) 0.4 MG SL tablet Place 1 tablet (0.4 mg total) under the tongue every 5 (five) minutes as needed for chest pain. 20 tablet 0   pantoprazole (PROTONIX) 40 MG tablet TAKE 1 TABLET BY MOUTH DAILY AT 12 NOON 90 tablet 0   Semaglutide,0.25 or 0.5MG /DOS, (OZEMPIC, 0.25 OR 0.5 MG/DOSE,) 2  MG/3ML SOPN Inject 0.25 mg weekly subq once weekly for 4 weeks. Then, increase to 0.5 mg weekly thereafter. 3 mL 2   tiZANidine (ZANAFLEX) 4 MG tablet Take 4 mg by mouth 3 (three) times daily as needed.     losartan-hydrochlorothiazide (HYZAAR) 100-25 MG tablet TAKE 1 TABLET BY MOUTH DAILY. 90 tablet 3   No current facility-administered medications for this visit.     Review of Systems    She denies chest pain, palpitations, dyspnea, pnd, orthopnea, n, v, dizziness, syncope, edema, weight gain, or early satiety. All other systems reviewed and are otherwise negative except as noted above.    Physical Exam    VS:  BP 116/72   Pulse 71   Ht 5\' 3"  (1.6 m)   Wt 190 lb 9.6 oz (86.5 kg)   SpO2 98%   BMI 33.76 kg/m  GEN: Well nourished, well developed, in no acute distress. HEENT: normal. Neck: Supple, no JVD, carotid bruits, or masses. Cardiac: RRR, no murmurs, rubs, or gallops. No clubbing, cyanosis, edema.  Radials/DP/PT 2+ and equal bilaterally.  Respiratory:  Respirations regular and unlabored, clear to auscultation bilaterally. GI: Soft, nontender, nondistended, BS + x 4. MS: no deformity or atrophy. Skin: warm and dry, no rash. Neuro:  Strength and sensation are intact. Psych: Normal affect.  Accessory Clinical Findings    ECG personally reviewed by me today - NSR, 71 bpm - no acute changes.   Lab Results  Component Value Date   WBC 7.3 10/22/2021   HGB 13.9 10/22/2021   HCT 41.8 10/22/2021   MCV 92 10/22/2021   PLT 277 10/22/2021   Lab Results  Component Value Date   CREATININE 1.22 (H) 10/22/2021   BUN 21 10/22/2021   NA 140 10/22/2021   K 4.3 10/22/2021   CL 100 10/22/2021   CO2 20 10/22/2021   Lab Results  Component Value Date   ALT 18 10/22/2021   AST 17 10/22/2021   ALKPHOS 87 10/22/2021   BILITOT 0.3 10/22/2021   Lab Results  Component Value Date   CHOL 154 01/27/2021   HDL 56 01/27/2021   LDLCALC 74 01/27/2021   TRIG 139 01/27/2021   CHOLHDL 2.8  01/27/2021    Lab Results  Component Value Date   HGBA1C 11.1 (A) 10/01/2021    Assessment & Plan    1. Chest pain: Coronary calcium score in 09/2019 was 0. Echo was normal in 09/2020. She notes a 1 month history of intermittent chest discomfort which she describes as a mild aching, symptoms occur approximately once a week and last for only seconds at a time. She also notes mild dyspnea on exertion.  EKG today is unremarkable.  Given somewhat atypical symptoms and recent reassuring work-up, will defer further ischemic evaluation at this time. I advised patient and daughter to notify us if her symptoms become more frequent or persistent.  Could consider repeat echo versus coronary  CTA at that time.  She is no longer taking aspirin.  Continue losartan-HCTZ, Lipitor.  2. Hypertension: BP well controlled. Continue current antihypertensive regimen.   3. Hyperlipidemia: LDL was 74 in 12/2020. Continue ASA, Lipitor.   4. Type 2 diabetes: A1C was 11.1 in 09/2021. Monitored and managed per PCP.   5. PSP: Following with neurology.   6. Disposition: Follow-up in 4-6 months with Dr. Gwenlyn Found.      Lenna Sciara, NP 11/12/2021, 8:28 AM

## 2021-11-13 DIAGNOSIS — E119 Type 2 diabetes mellitus without complications: Secondary | ICD-10-CM | POA: Diagnosis not present

## 2021-11-13 DIAGNOSIS — M6281 Muscle weakness (generalized): Secondary | ICD-10-CM | POA: Diagnosis not present

## 2021-11-13 DIAGNOSIS — R4782 Fluency disorder in conditions classified elsewhere: Secondary | ICD-10-CM | POA: Diagnosis not present

## 2021-11-13 DIAGNOSIS — G20 Parkinson's disease: Secondary | ICD-10-CM | POA: Diagnosis not present

## 2021-11-13 DIAGNOSIS — E785 Hyperlipidemia, unspecified: Secondary | ICD-10-CM | POA: Diagnosis not present

## 2021-11-13 DIAGNOSIS — I10 Essential (primary) hypertension: Secondary | ICD-10-CM | POA: Diagnosis not present

## 2021-11-13 DIAGNOSIS — R269 Unspecified abnormalities of gait and mobility: Secondary | ICD-10-CM | POA: Diagnosis not present

## 2021-11-13 DIAGNOSIS — R296 Repeated falls: Secondary | ICD-10-CM | POA: Diagnosis not present

## 2021-11-13 NOTE — Telephone Encounter (Signed)
FYI

## 2021-11-16 DIAGNOSIS — R296 Repeated falls: Secondary | ICD-10-CM | POA: Diagnosis not present

## 2021-11-16 DIAGNOSIS — I10 Essential (primary) hypertension: Secondary | ICD-10-CM | POA: Diagnosis not present

## 2021-11-16 DIAGNOSIS — R269 Unspecified abnormalities of gait and mobility: Secondary | ICD-10-CM | POA: Diagnosis not present

## 2021-11-16 DIAGNOSIS — E119 Type 2 diabetes mellitus without complications: Secondary | ICD-10-CM | POA: Diagnosis not present

## 2021-11-16 DIAGNOSIS — M6281 Muscle weakness (generalized): Secondary | ICD-10-CM | POA: Diagnosis not present

## 2021-11-16 DIAGNOSIS — R4782 Fluency disorder in conditions classified elsewhere: Secondary | ICD-10-CM | POA: Diagnosis not present

## 2021-11-16 DIAGNOSIS — G20 Parkinson's disease: Secondary | ICD-10-CM | POA: Diagnosis not present

## 2021-11-16 DIAGNOSIS — E785 Hyperlipidemia, unspecified: Secondary | ICD-10-CM | POA: Diagnosis not present

## 2021-11-16 NOTE — Telephone Encounter (Signed)
Pt was called and informed to document her blood sugars and bring them to her upcoming visit.

## 2021-11-18 DIAGNOSIS — R4782 Fluency disorder in conditions classified elsewhere: Secondary | ICD-10-CM | POA: Diagnosis not present

## 2021-11-18 DIAGNOSIS — M6281 Muscle weakness (generalized): Secondary | ICD-10-CM | POA: Diagnosis not present

## 2021-11-18 DIAGNOSIS — R269 Unspecified abnormalities of gait and mobility: Secondary | ICD-10-CM | POA: Diagnosis not present

## 2021-11-18 DIAGNOSIS — E785 Hyperlipidemia, unspecified: Secondary | ICD-10-CM | POA: Diagnosis not present

## 2021-11-18 DIAGNOSIS — G20 Parkinson's disease: Secondary | ICD-10-CM | POA: Diagnosis not present

## 2021-11-18 DIAGNOSIS — R296 Repeated falls: Secondary | ICD-10-CM | POA: Diagnosis not present

## 2021-11-18 DIAGNOSIS — E119 Type 2 diabetes mellitus without complications: Secondary | ICD-10-CM | POA: Diagnosis not present

## 2021-11-18 DIAGNOSIS — I10 Essential (primary) hypertension: Secondary | ICD-10-CM | POA: Diagnosis not present

## 2021-11-23 ENCOUNTER — Encounter: Payer: Self-pay | Admitting: Internal Medicine

## 2021-11-23 ENCOUNTER — Telehealth: Payer: Self-pay | Admitting: Neurology

## 2021-11-23 ENCOUNTER — Ambulatory Visit: Payer: Medicare Other | Attending: Internal Medicine | Admitting: Internal Medicine

## 2021-11-23 VITALS — BP 121/79 | HR 79 | Temp 98.6°F | Ht 63.0 in | Wt 189.2 lb

## 2021-11-23 DIAGNOSIS — R5383 Other fatigue: Secondary | ICD-10-CM

## 2021-11-23 DIAGNOSIS — R413 Other amnesia: Secondary | ICD-10-CM | POA: Diagnosis not present

## 2021-11-23 DIAGNOSIS — R269 Unspecified abnormalities of gait and mobility: Secondary | ICD-10-CM | POA: Diagnosis not present

## 2021-11-23 DIAGNOSIS — Z23 Encounter for immunization: Secondary | ICD-10-CM | POA: Diagnosis not present

## 2021-11-23 DIAGNOSIS — E119 Type 2 diabetes mellitus without complications: Secondary | ICD-10-CM | POA: Diagnosis not present

## 2021-11-23 DIAGNOSIS — H538 Other visual disturbances: Secondary | ICD-10-CM

## 2021-11-23 DIAGNOSIS — G20 Parkinson's disease: Secondary | ICD-10-CM | POA: Diagnosis not present

## 2021-11-23 DIAGNOSIS — E1169 Type 2 diabetes mellitus with other specified complication: Secondary | ICD-10-CM | POA: Diagnosis not present

## 2021-11-23 DIAGNOSIS — R4782 Fluency disorder in conditions classified elsewhere: Secondary | ICD-10-CM | POA: Diagnosis not present

## 2021-11-23 DIAGNOSIS — M6281 Muscle weakness (generalized): Secondary | ICD-10-CM | POA: Diagnosis not present

## 2021-11-23 DIAGNOSIS — M2669 Other specified disorders of temporomandibular joint: Secondary | ICD-10-CM

## 2021-11-23 DIAGNOSIS — N1831 Chronic kidney disease, stage 3a: Secondary | ICD-10-CM

## 2021-11-23 DIAGNOSIS — E785 Hyperlipidemia, unspecified: Secondary | ICD-10-CM | POA: Diagnosis not present

## 2021-11-23 DIAGNOSIS — E669 Obesity, unspecified: Secondary | ICD-10-CM

## 2021-11-23 DIAGNOSIS — R296 Repeated falls: Secondary | ICD-10-CM | POA: Diagnosis not present

## 2021-11-23 DIAGNOSIS — I10 Essential (primary) hypertension: Secondary | ICD-10-CM | POA: Diagnosis not present

## 2021-11-23 MED ORDER — PREDNISONE 20 MG PO TABS: ORAL_TABLET | ORAL | 0 refills | Status: DC

## 2021-11-23 MED ORDER — INSULIN LISPRO (1 UNIT DIAL) 100 UNIT/ML (KWIKPEN): PEN_INJECTOR | SUBCUTANEOUS | 11 refills | Status: DC

## 2021-11-23 NOTE — Telephone Encounter (Signed)
Called patients daughter and left voicemail

## 2021-11-23 NOTE — Telephone Encounter (Signed)
Pt's daughter came in stating her mother wants to be evaluated for a power wheelchair and wants to make sure it will be ok to have that done at her next appt on 12/08/21.

## 2021-11-23 NOTE — Progress Notes (Signed)
Patient ID: Kristi Campos, female    DOB: 09/13/1950  MRN: [id number]  CC: Diabetes   Subjective: Kristi Campos is a 71 y.o. female who presents for f/u fatigue, dizziness, jaw claudication.  Her daughter is with her. 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  Daughter, [name], is with her.     Pt saw Dr. Lenell Antu, vascular surgeon 11/10/2021 to be considered for temporal artery biopsy.  She had just started Ozempic.  He wanted her to be off the medication for at least 1 week prior to the procedure.  Procedure scheduled for this Thursday. Jaws still feels tired when she chews.  Reports good appetite.  Wgh has stayed fairly stable Reports vision getting worse in both eyes; double vision.    Has appointment with Dr. Dione Booze next month.  Looks like she is still not wearing her glasses consistently.  Fatigue no better. Gets SOB easily.  Saw cardiology NP 11/12/2021 for CP/DOE.  No further work-up planned at this time as she had normal echo and coronary CT 2 years ago.  BNP was normal. No falls.   ESR was 73 but CRP was normal on blood test done on last visit.  GFR was 47 with creatinine of 1.22.  Previous GFR was 52.  DM:  reports BS have been good.  Checking BS 3x/day.  Forgot to bring log.  BS this a/m was 108.  Other readings that she can recall were 88, 142, 91.  No BS below 80.  Still on Lantus 15 units daily on med list but pt states she was told by clinical pharmacist to decrease dose to 12 units daily.  She has been taking 12 units.  Still on Jardiance 25 mg daily, Glipizide.  Ozempic added 11/09/2021.    Daughter notice that pt is becoming more forgetful.  Forgets the days of the week and dates of appts.  She sets up her med box herself and remembers to take meds.  No forgetting to turn off water or stove.    Patient Active Problem List   Diagnosis Date Noted    Progressive supranuclear palsy (HCC) 01/27/2021   AKI (acute kidney injury) (HCC) 10/18/2020   Chest pain 10/17/2020   Primary osteoarthritis, right wrist 02/28/2020   Peripheral arterial disease (HCC) 10/03/2019   Essential hypertension 10/03/2019   Hyperlipidemia 10/03/2019   Diabetes mellitus type 2 in obese (HCC) 11/24/2018   Chronic right-sided low back pain without sciatica 10/24/2018     Current Outpatient Medications on File Prior to Visit  Medication Sig Dispense Refill   aspirin EC 81 MG tablet Take 81 mg by mouth daily.     atorvastatin (LIPITOR) 10 MG tablet Take 1 tablet (10 mg total) by mouth daily. 90 tablet 1   Blood Glucose Monitoring Suppl (ACCU-CHEK GUIDE) w/Device KIT Use to check blood sugar TID. Dx E11.69 1 kit 0   Continuous Blood Gluc Receiver (FREESTYLE LIBRE READER) DEVI UAD 1 each 0   Continuous Blood Gluc Sensor (FREESTYLE LIBRE SENSOR SYSTEM) MISC Change sensor Q 2 wks 2 each 12   docusate sodium (COLACE) 100 MG capsule Take 1 capsule (100 mg total) by mouth every 12 (twelve) hours. 30 capsule 0   glipiZIDE (GLUCOTROL XL) 10 MG 24 hr tablet Take 1 tablet (10 mg total) by mouth every morning. 30 tablet 4   glucose blood (  ACCU-CHEK GUIDE) test strip Use to check blood sugar TID. Dx E11.69 100 each 2   insulin glargine (LANTUS SOLOSTAR) 100 UNIT/ML Solostar Pen Inject 15 Units into the skin daily. 15 mL PRN   Insulin Pen Needle (PEN NEEDLES) 31G X 8 MM MISC UAD 100 each 6   JARDIANCE 25 MG TABS tablet Take 1 tablet (25 mg total) by mouth daily. 30 tablet 3   losartan-hydrochlorothiazide (HYZAAR) 100-25 MG tablet Take 1 tablet by mouth daily.     nitroGLYCERIN (NITROSTAT) 0.4 MG SL tablet Place 1 tablet (0.4 mg total) under the tongue every 5 (five) minutes as needed for chest pain. 20 tablet 0   pantoprazole (PROTONIX) 40 MG tablet TAKE 1 TABLET BY MOUTH DAILY AT 12 NOON 90 tablet 0   Semaglutide,0.25 or 0.5MG /DOS, (OZEMPIC, 0.25 OR 0.5 MG/DOSE,) 2 MG/3ML SOPN  Inject 0.25 mg weekly subq once weekly for 4 weeks. Then, increase to 0.5 mg weekly thereafter. (Patient taking differently: Inject 0.25 mg into the skin every Tuesday. Inject 0.25 mg weekly subq once weekly for 4 weeks. Then, increase to 0.5 mg weekly thereafter.) 3 mL 2   [DISCONTINUED] simvastatin (ZOCOR) 10 MG tablet Take 10 mg by mouth at bedtime.     No current facility-administered medications on file prior to visit.    Allergies  Allergen Reactions   Metformin And Related Diarrhea    Social History   Socioeconomic History   Marital status: Widowed    Spouse name: Not on file   Number of children: 4   Years of education: Not on file   Highest education level: Not on file  Occupational History    Employer: other   Occupation: retired    Comment: weave for living  Tobacco Use   Smoking status: Never   Smokeless tobacco: Never  Vaping Use   Vaping Use: Never used  Substance and Sexual Activity   Alcohol use: No    Alcohol/week: 0.0 standard drinks of alcohol   Drug use: No   Sexual activity: Not Currently    Partners: Male    Birth control/protection: Post-menopausal  Other Topics Concern   Not on file  Social History Narrative   Right handed    Lives at home alone ONE LEVEL   Caffeine1 cup daily   Social Determinants of Health   Financial Resource Strain: Not on file  Food Insecurity: Not on file  Transportation Needs: Not on file  Physical Activity: Not on file  Stress: Not on file  Social Connections: Not on file  Intimate Partner Violence: Not on file    Family History  Problem Relation Age of Onset   Bipolar disorder Mother    Hypertension Sister    Hypertension Sister    Hypertension Sister    Hypertension Sister    Hypertension Sister    Hypertension Sister    Hypertension Daughter    Diabetes Son    Prostate cancer Son     Past Surgical History:  Procedure Laterality Date   I & D EXTREMITY Right 08/09/2013   Procedure: MINOR IRRIGATION  AND DEBRIDEMENT EXTREMITY;  Surgeon: Cammie Sickle, MD;  Location: [facility];  Service: Orthopedics;  Laterality: Right;  long       wound class 4    ROS: Review of Systems Negative except as stated above  PHYSICAL EXAM: BP 121/79   Pulse 79   Temp 98.6 F (37 C) (Oral)   Ht 5\' 3"  (1.6 m)  Wt 189 lb 3.2 oz (85.8 kg)   SpO2 94%   BMI 33.52 kg/m   Wt Readings from Last 3 Encounters:  11/23/21 189 lb 3.2 oz (85.8 kg)  11/12/21 190 lb 9.6 oz (86.5 kg)  11/10/21 190 lb (86.2 kg)    Physical Exam  General appearance -patient is alert and in no distress. Mental status -she follows commands appropriately.  Mild to moderate stuttering.  Clenches her eyes close when talking. Chest - clear to auscultation, no wheezes, rales or rhonchi, symmetric air entry Heart - normal rate, regular rhythm, normal S1, S2, no murmurs, rubs, clicks or gallops Extremities -no lower extremity edema Ambulates with rollator [place].       Latest Ref Rng & Units 10/22/2021   11:34 AM 07/23/2021    9:45 AM 10/18/2020    5:44 AM  CMP  Glucose 70 - 99 mg/dL 179  255  171   BUN 8 - 27 mg/dL $Remove'21  16  16   'QIMcNmV$ Creatinine 0.57 - 1.00 mg/dL 1.22  1.13  0.93   Sodium 134 - 144 mmol/L 140  142  138   Potassium 3.5 - 5.2 mmol/L 4.3  4.7  3.5   Chloride 96 - 106 mmol/L 100  101  106   CO2 20 - 29 mmol/L $RemoveB'20  22  24   'LMXFMafj$ Calcium 8.7 - 10.3 mg/dL 10.3  10.3  9.2   Total Protein 6.0 - 8.5 g/dL 8.0  7.8  6.8   Total Bilirubin 0.0 - 1.2 mg/dL 0.3  0.4  0.5   Alkaline Phos 44 - 121 IU/L 87  93  56   AST 0 - 40 IU/L $Remov'17  18  18   'BRMowR$ ALT 0 - 32 IU/L $Remov'18  17  13    'apdDls$ Lipid Panel     Component Value Date/Time   CHOL 154 01/27/2021 1134   TRIG 139 01/27/2021 1134   HDL 56 01/27/2021 1134   CHOLHDL 2.8 01/27/2021 1134   LDLCALC 74 01/27/2021 1134    CBC    Component Value Date/Time   WBC 7.3 10/22/2021 1134   WBC 6.5 10/18/2020 0544   RBC 4.57 10/22/2021 1134   RBC 4.13 10/18/2020 0544   HGB 13.9  10/22/2021 1134   HCT 41.8 10/22/2021 1134   PLT 277 10/22/2021 1134   MCV 92 10/22/2021 1134   MCH 30.4 10/22/2021 1134   MCH 30.8 10/18/2020 0544   MCHC 33.3 10/22/2021 1134   MCHC 33.5 10/18/2020 0544   RDW 13.9 10/22/2021 1134   LYMPHSABS 2.5 10/17/2020 1432   MONOABS 0.5 10/17/2020 1432   EOSABS 0.2 10/17/2020 1432   BASOSABS 0.0 10/17/2020 1432    ASSESSMENT AND PLAN: 1. Jaw claudication 2. Blurred vision, bilateral -Discussed with patient and daughter again background concern and differential for possible giant cell arthritis though symptoms are most likely related to her underlying neurologic condition PSP. Scheduled to have temporal artery biopsy done later this week.  We discussed going ahead and starting her on prednisone in the meantime until we get the results of the biopsy.  Starting her on prednisone now would not alter the results if indeed it is GCA.  Advised that prednisone will cause elevation in blood sugars.  If she is agreeable to starting the prednisone, we will need to put her on short acting insulin NovoLog sliding scale to take with meals.  Patient is agreeable to starting the prednisone.  I went over with her how to use the  NovoLog.  We will have her stop the glipizide for now.  She will resume Ozempic after the biopsy.  We will recheck sed rate and C-reactive protein today to see if there has been any major changes in levels. - predniSONE (DELTASONE) 20 MG tablet; 30 tabs PO daily  Dispense: 11 tablet; Refill: 0 - Sedimentation Rate - C-reactive protein   3. Fatigue, unspecified type Likely due to underlying neurologic condition.  Recent CBC was normal. - Sedimentation Rate - C-reactive protein  4. Diabetes mellitus type 2 in obese ([place]) See #2 above. - insulin lispro (HUMALOG KWIKPEN) 100 UNIT/ML KwikPen; Give with meals. If BS 200-250 give 2 units Subcut; BS 251-300 4 units, BS 301-350 6 units, 351-400 8 units, 401-450 10 units, >450 give 13 units.   Dispense: 15 mL; Refill: 11  5. Stage 3a chronic kidney disease (HCC) We will continue to observe.  Avoid NSAIDs. - Basic Metabolic Panel  6. Memory changes Advised to discuss further with Dr. Carles Collet next month.  Went over ways to help memory like keeping a calendar visible on the refrigerator and marking off each day as she goes along.  Also recommend writing upcoming appointments on the calendar to remind her.  7. Need for influenza vaccination - Flu Vaccine QUAD High Dose(Fluad)     Patient was given the opportunity to ask questions.  Patient verbalized understanding of the plan and was able to repeat key elements of the plan.   This documentation was completed using Radio producer.  Any transcriptional errors are unintentional.  No orders of the defined types were placed in this encounter.    Requested Prescriptions    No prescriptions requested or ordered in this encounter    No follow-ups on file.  Karle Plumber, MD, FACP

## 2021-11-23 NOTE — Progress Notes (Signed)
Vision is getting worse Forgetful.

## 2021-11-23 NOTE — Patient Instructions (Signed)
Hold off on taking glipizide for now.  Start prednisone until we get the results of the biopsy.  This will cause elevation in your blood sugars.  We have started you on short acting insulin to take with meals.

## 2021-11-24 LAB — SEDIMENTATION RATE: Sed Rate: 32 mm/hr (ref 0–40)

## 2021-11-24 LAB — BASIC METABOLIC PANEL
BUN/Creatinine Ratio: 13 (ref 12–28)
BUN: 17 mg/dL (ref 8–27)
CO2: 24 mmol/L (ref 20–29)
Calcium: 10.6 mg/dL — ABNORMAL HIGH (ref 8.7–10.3)
Chloride: 100 mmol/L (ref 96–106)
Creatinine, Ser: 1.34 mg/dL — ABNORMAL HIGH (ref 0.57–1.00)
Glucose: 98 mg/dL (ref 70–99)
Potassium: 5 mmol/L (ref 3.5–5.2)
Sodium: 144 mmol/L (ref 134–144)
eGFR: 42 mL/min/{1.73_m2} — ABNORMAL LOW (ref >59)

## 2021-11-24 LAB — C-REACTIVE PROTEIN: CRP: 7 mg/L (ref 0–10)

## 2021-11-24 NOTE — Telephone Encounter (Signed)
Called patients daughter and left voicemail

## 2021-11-25 ENCOUNTER — Other Ambulatory Visit: Payer: Self-pay

## 2021-11-25 ENCOUNTER — Encounter (HOSPITAL_COMMUNITY): Payer: Self-pay | Admitting: Vascular Surgery

## 2021-11-25 ENCOUNTER — Other Ambulatory Visit: Payer: Self-pay | Admitting: Internal Medicine

## 2021-11-25 DIAGNOSIS — E119 Type 2 diabetes mellitus without complications: Secondary | ICD-10-CM | POA: Diagnosis not present

## 2021-11-25 DIAGNOSIS — M6281 Muscle weakness (generalized): Secondary | ICD-10-CM | POA: Diagnosis not present

## 2021-11-25 DIAGNOSIS — R269 Unspecified abnormalities of gait and mobility: Secondary | ICD-10-CM | POA: Diagnosis not present

## 2021-11-25 DIAGNOSIS — I10 Essential (primary) hypertension: Secondary | ICD-10-CM | POA: Diagnosis not present

## 2021-11-25 DIAGNOSIS — R4782 Fluency disorder in conditions classified elsewhere: Secondary | ICD-10-CM | POA: Diagnosis not present

## 2021-11-25 DIAGNOSIS — E785 Hyperlipidemia, unspecified: Secondary | ICD-10-CM | POA: Diagnosis not present

## 2021-11-25 DIAGNOSIS — R296 Repeated falls: Secondary | ICD-10-CM | POA: Diagnosis not present

## 2021-11-25 DIAGNOSIS — G20 Parkinson's disease: Secondary | ICD-10-CM | POA: Diagnosis not present

## 2021-11-25 MED ORDER — LOSARTAN POTASSIUM-HCTZ 100-25 MG PO TABS: ORAL_TABLET | ORAL | 1 refills | Status: DC

## 2021-11-25 NOTE — Telephone Encounter (Signed)
Spoke to patients daughter and they understand that this upcoming appointment can be the WC eval but can not discuss any medical needs at tat appointment only about the [facility] [place] and we will schedule; a F/U visit that day to go over any medical concerns they may have

## 2021-11-25 NOTE — Progress Notes (Signed)
Spoke with pt's daughter, Beau Fanny for pre-op call. DPR on file. [name] states pt does not have a cardiac history. Pt is treated for HTN and is a type 2 diabetic. Last A1C was 11.1 on 10/01/21. [name] states pt was started on Ozempic then and she states her blood sugar is running much better now. Fasting blood sugar is now around 108. Pt's last dose of Ozempic was 11/17/21. Instructed [name] to have pt not take her Jardiance in the AM. Instructed her to have pt take 1/2 of her regular dose of Lantus Insulin in the AM, she will take 7 units. Instructed her to check her blood sugar when she wakes up and every 2 hours until she leaves for the hospital. If blood sugar is >220 take 1/2 of usual correction dose of Humalog insulin. If blood sugar is 70 or below, treat with 1/2 cup of clear juice (apple or cranberry) and recheck blood sugar 15 minutes after drinking juice. If blood sugar continues to be 70 or below, call the Short Stay department and ask to speak to a nurse.  Shower instructions and [name] voiced understanding.

## 2021-11-26 ENCOUNTER — Ambulatory Visit (HOSPITAL_COMMUNITY)
Admission: RE | Admit: 2021-11-26 | Discharge: 2021-11-26 | Disposition: A | Discharge status: Home / Self Care | Payer: Medicare Other | Attending: Vascular Surgery | Admitting: Vascular Surgery

## 2021-11-26 ENCOUNTER — Encounter (HOSPITAL_COMMUNITY): Payer: Self-pay | Admitting: Vascular Surgery

## 2021-11-26 ENCOUNTER — Other Ambulatory Visit: Payer: Self-pay

## 2021-11-26 ENCOUNTER — Ambulatory Visit (HOSPITAL_COMMUNITY): Payer: Medicare Other | Admitting: Anesthesiology

## 2021-11-26 ENCOUNTER — Ambulatory Visit (HOSPITAL_BASED_OUTPATIENT_CLINIC_OR_DEPARTMENT_OTHER): Payer: Medicare Other | Admitting: Anesthesiology

## 2021-11-26 ENCOUNTER — Encounter (HOSPITAL_COMMUNITY)
Admission: RE | Disposition: A | Discharge status: Home / Self Care | Payer: Self-pay | Source: Home / Self Care | Attending: Vascular Surgery

## 2021-11-26 DIAGNOSIS — Z8249 Family history of ischemic heart disease and other diseases of the circulatory system: Secondary | ICD-10-CM | POA: Insufficient documentation

## 2021-11-26 DIAGNOSIS — I773 Arterial fibromuscular dysplasia: Secondary | ICD-10-CM | POA: Diagnosis not present

## 2021-11-26 DIAGNOSIS — E1151 Type 2 diabetes mellitus with diabetic peripheral angiopathy without gangrene: Secondary | ICD-10-CM | POA: Insufficient documentation

## 2021-11-26 DIAGNOSIS — R519 Headache, unspecified: Secondary | ICD-10-CM | POA: Diagnosis not present

## 2021-11-26 DIAGNOSIS — K219 Gastro-esophageal reflux disease without esophagitis: Secondary | ICD-10-CM | POA: Insufficient documentation

## 2021-11-26 DIAGNOSIS — I10 Essential (primary) hypertension: Secondary | ICD-10-CM

## 2021-11-26 DIAGNOSIS — M278 Other specified diseases of jaws: Secondary | ICD-10-CM | POA: Insufficient documentation

## 2021-11-26 DIAGNOSIS — Z79899 Other long term (current) drug therapy: Secondary | ICD-10-CM | POA: Insufficient documentation

## 2021-11-26 DIAGNOSIS — Z833 Family history of diabetes mellitus: Secondary | ICD-10-CM | POA: Insufficient documentation

## 2021-11-26 DIAGNOSIS — Z7984 Long term (current) use of oral hypoglycemic drugs: Secondary | ICD-10-CM | POA: Diagnosis not present

## 2021-11-26 DIAGNOSIS — Z794 Long term (current) use of insulin: Secondary | ICD-10-CM | POA: Insufficient documentation

## 2021-11-26 DIAGNOSIS — M1990 Unspecified osteoarthritis, unspecified site: Secondary | ICD-10-CM | POA: Insufficient documentation

## 2021-11-26 DIAGNOSIS — H539 Unspecified visual disturbance: Secondary | ICD-10-CM | POA: Diagnosis not present

## 2021-11-26 DIAGNOSIS — H538 Other visual disturbances: Secondary | ICD-10-CM | POA: Diagnosis not present

## 2021-11-26 DIAGNOSIS — Z7985 Long-term (current) use of injectable non-insulin antidiabetic drugs: Secondary | ICD-10-CM | POA: Insufficient documentation

## 2021-11-26 HISTORY — PX: ARTERY BIOPSY: SHX891

## 2021-11-26 HISTORY — DX: Gastro-esophageal reflux disease without esophagitis: K21.9

## 2021-11-26 LAB — POCT I-STAT, CHEM 8
BUN: 26 mg/dL — ABNORMAL HIGH (ref 8–23)
Calcium, Ion: 1.26 mmol/L (ref 1.15–1.40)
Chloride: 105 mmol/L (ref 98–111)
Creatinine, Ser: 1 mg/dL (ref 0.44–1.00)
Glucose, Bld: 117 mg/dL — ABNORMAL HIGH (ref 70–99)
HCT: 41 % (ref 36.0–46.0)
Hemoglobin: 13.9 g/dL (ref 12.0–15.0)
Potassium: 3.7 mmol/L (ref 3.5–5.1)
Sodium: 142 mmol/L (ref 135–145)
TCO2: 27 mmol/L (ref 22–32)

## 2021-11-26 LAB — GLUCOSE, CAPILLARY
Glucose-Capillary: 119 mg/dL — ABNORMAL HIGH (ref 70–99)
Glucose-Capillary: 94 mg/dL (ref 70–99)
Glucose-Capillary: 101 mg/dL — ABNORMAL HIGH (ref 70–99)

## 2021-11-26 SURGERY — BIOPSY TEMPORAL ARTERY
Anesthesia: Monitor Anesthesia Care | Site: Head | Laterality: Left

## 2021-11-26 MED ORDER — CHLORHEXIDINE GLUCONATE 4 % EX LIQD: 60.0000 mL | Freq: Once | CUTANEOUS | Status: DC

## 2021-11-26 MED ORDER — CHLORHEXIDINE GLUCONATE 0.12 % MT SOLN
15.0000 mL | OROMUCOSAL | Status: AC
  Filled 2021-11-26: qty 15

## 2021-11-26 MED ORDER — SODIUM CHLORIDE 0.9 % IV SOLN: INTRAVENOUS | Status: DC

## 2021-11-26 MED ORDER — FENTANYL CITRATE (PF) 250 MCG/5ML IJ SOLN
INTRAMUSCULAR | Status: AC
  Filled 2021-11-26: qty 5

## 2021-11-26 MED ORDER — FENTANYL CITRATE (PF) 100 MCG/2ML IJ SOLN
25.0000 ug | INTRAMUSCULAR | Status: DC | PRN
  Administered 2021-11-26 (×2): 25 ug via INTRAVENOUS

## 2021-11-26 MED ORDER — LIDOCAINE 2% (20 MG/ML) 5 ML SYRINGE
INTRAMUSCULAR | Status: AC
  Filled 2021-11-26: qty 5

## 2021-11-26 MED ORDER — ONDANSETRON HCL 4 MG/2ML IJ SOLN
INTRAMUSCULAR | Status: AC
  Filled 2021-11-26: qty 2

## 2021-11-26 MED ORDER — ACETAMINOPHEN 500 MG PO TABS
1000.0000 mg | ORAL_TABLET | Freq: Once | ORAL | Status: AC
  Administered 2021-11-26: 1000 mg via ORAL
  Filled 2021-11-26: qty 2

## 2021-11-26 MED ORDER — MIDAZOLAM HCL 2 MG/2ML IJ SOLN
INTRAMUSCULAR | Status: AC
  Filled 2021-11-26: qty 2

## 2021-11-26 MED ORDER — ONDANSETRON HCL 4 MG/2ML IJ SOLN
INTRAMUSCULAR | Status: DC | PRN
  Administered 2021-11-26: 4 mg via INTRAVENOUS

## 2021-11-26 MED ORDER — FENTANYL CITRATE (PF) 100 MCG/2ML IJ SOLN
INTRAMUSCULAR | Status: AC
  Filled 2021-11-26: qty 2

## 2021-11-26 MED ORDER — LIDOCAINE HCL (PF) 1 % IJ SOLN
INTRAMUSCULAR | Status: DC | PRN
  Administered 2021-11-26: 3.5 mL

## 2021-11-26 MED ORDER — LIDOCAINE HCL (PF) 1 % IJ SOLN
INTRAMUSCULAR | Status: AC
  Filled 2021-11-26: qty 30

## 2021-11-26 MED ORDER — INSULIN ASPART 100 UNIT/ML IJ SOLN: 0.0000 [IU] | INTRAMUSCULAR | Status: DC | PRN

## 2021-11-26 MED ORDER — FENTANYL CITRATE (PF) 250 MCG/5ML IJ SOLN
INTRAMUSCULAR | Status: DC | PRN
  Administered 2021-11-26 (×2): 25 ug via INTRAVENOUS

## 2021-11-26 MED ORDER — PHENYLEPHRINE 80 MCG/ML (10ML) SYRINGE FOR IV PUSH (FOR BLOOD PRESSURE SUPPORT)
PREFILLED_SYRINGE | INTRAVENOUS | Status: DC | PRN
  Administered 2021-11-26: 120 ug via INTRAVENOUS

## 2021-11-26 MED ORDER — PROPOFOL 10 MG/ML IV BOLUS
INTRAVENOUS | Status: DC | PRN
  Administered 2021-11-26 (×2): 20 mg via INTRAVENOUS

## 2021-11-26 MED ORDER — DEXAMETHASONE SODIUM PHOSPHATE 10 MG/ML IJ SOLN
INTRAMUSCULAR | Status: DC | PRN
  Administered 2021-11-26: 5 mg via INTRAVENOUS

## 2021-11-26 MED ORDER — LIDOCAINE 2% (20 MG/ML) 5 ML SYRINGE
INTRAMUSCULAR | Status: DC | PRN
  Administered 2021-11-26: 60 mg via INTRAVENOUS

## 2021-11-26 MED ORDER — MIDAZOLAM HCL 2 MG/2ML IJ SOLN
INTRAMUSCULAR | Status: DC | PRN
  Administered 2021-11-26: 1 mg via INTRAVENOUS

## 2021-11-26 MED ORDER — 0.9 % SODIUM CHLORIDE (POUR BTL) OPTIME
TOPICAL | Status: DC | PRN
  Administered 2021-11-26: 500 mL

## 2021-11-26 MED ORDER — DEXAMETHASONE SODIUM PHOSPHATE 10 MG/ML IJ SOLN
INTRAMUSCULAR | Status: AC
  Filled 2021-11-26: qty 1

## 2021-11-26 MED ORDER — CEFAZOLIN SODIUM-DEXTROSE 2-4 GM/100ML-% IV SOLN
2.0000 g | INTRAVENOUS | Status: AC
  Administered 2021-11-26: 2 g via INTRAVENOUS

## 2021-11-26 MED ORDER — CHLORHEXIDINE GLUCONATE 0.12 % MT SOLN
OROMUCOSAL | Status: AC
  Administered 2021-11-26: 15 mL via OROMUCOSAL
  Filled 2021-11-26: qty 15

## 2021-11-26 MED ORDER — CEFAZOLIN SODIUM-DEXTROSE 2-4 GM/100ML-% IV SOLN
INTRAVENOUS | Status: AC
  Filled 2021-11-26: qty 100

## 2021-11-26 MED ORDER — PROPOFOL 500 MG/50ML IV EMUL
INTRAVENOUS | Status: DC | PRN
  Administered 2021-11-26: 75 ug/kg/min via INTRAVENOUS

## 2021-11-26 MED ORDER — TRAMADOL HCL 50 MG PO TABS: 50.0000 mg | ORAL_TABLET | Freq: Four times a day (QID) | ORAL | 0 refills | Status: AC | PRN

## 2021-11-26 SURGICAL SUPPLY — 50 items
APL PRP STRL LF DISP 70% ISPRP (MISCELLANEOUS) ×1
APL SKNCLS STERI-STRIP NONHPOA (GAUZE/BANDAGES/DRESSINGS) ×1
BAG COUNTER SPONGE SURGICOUNT (BAG) ×1 IMPLANT
BAG SPNG CNTER NS LX DISP (BAG)
BALL CTTN LRG ABS STRL LF (GAUZE/BANDAGES/DRESSINGS) ×1
BENZOIN TINCTURE PRP APPL 2/3 (GAUZE/BANDAGES/DRESSINGS) ×2 IMPLANT
CANISTER SUCT 3000ML PPV (MISCELLANEOUS) ×1 IMPLANT
CHLORAPREP W/TINT 26 (MISCELLANEOUS) ×1 IMPLANT
CLIP RETRACTION 3.0MM CORONARY (MISCELLANEOUS) IMPLANT
CLIP TI WIDE RED SMALL 6 (CLIP) IMPLANT
CNTNR URN SCR LID CUP LEK RST (MISCELLANEOUS) ×1 IMPLANT
CONT SPEC 4OZ STRL OR WHT (MISCELLANEOUS) ×1
COTTONBALL LRG STERILE PKG (GAUZE/BANDAGES/DRESSINGS) ×1 IMPLANT
COVER PROBE W GEL 5X96 (DRAPES) IMPLANT
COVER SURGICAL LIGHT HANDLE (MISCELLANEOUS) ×1 IMPLANT
DRAPE OPHTHALMIC 77X100 STRL (CUSTOM PROCEDURE TRAY) ×1 IMPLANT
ELECT NDL BLADE 2-5/6 (NEEDLE) ×1 IMPLANT
ELECT NEEDLE BLADE 2-5/6 (NEEDLE) ×1 IMPLANT
ELECT REM PT RETURN 9FT ADLT (ELECTROSURGICAL) ×1
ELECTRODE REM PT RTRN 9FT ADLT (ELECTROSURGICAL) ×1 IMPLANT
GAUZE 4X4 16PLY ~~LOC~~+RFID DBL (SPONGE) ×1 IMPLANT
GAUZE SPONGE 2X2 8PLY STRL LF (GAUZE/BANDAGES/DRESSINGS) IMPLANT
GEL ULTRASOUND 8.5O AQUASONIC (MISCELLANEOUS) ×1 IMPLANT
GLOVE BIO SURGEON STRL SZ8 (GLOVE) ×1 IMPLANT
GLOVE BIOGEL PI IND STRL 7.5 (GLOVE) IMPLANT
GOWN STRL REUS W/ TWL LRG LVL3 (GOWN DISPOSABLE) ×1 IMPLANT
GOWN STRL REUS W/ TWL XL LVL3 (GOWN DISPOSABLE) ×1 IMPLANT
GOWN STRL REUS W/TWL LRG LVL3 (GOWN DISPOSABLE) ×1
GOWN STRL REUS W/TWL XL LVL3 (GOWN DISPOSABLE) ×1
KIT BASIN OR (CUSTOM PROCEDURE TRAY) ×1 IMPLANT
KIT TURNOVER KIT B (KITS) ×1 IMPLANT
LOOP VESSEL MINI RED (MISCELLANEOUS) ×1 IMPLANT
NDL HYPO 25GX1X1/2 BEV (NEEDLE) ×1 IMPLANT
NEEDLE HYPO 25GX1X1/2 BEV (NEEDLE) ×1 IMPLANT
NS IRRIG 1000ML POUR BTL (IV SOLUTION) ×1 IMPLANT
PACK GENERAL/GYN (CUSTOM PROCEDURE TRAY) ×1 IMPLANT
PAD ARMBOARD 7.5X6 YLW CONV (MISCELLANEOUS) ×2 IMPLANT
SPIKE FLUID TRANSFER (MISCELLANEOUS) ×1 IMPLANT
STRIP CLOSURE SKIN 1/2X4 (GAUZE/BANDAGES/DRESSINGS) ×3 IMPLANT
SUCTION FRAZIER HANDLE 10FR (MISCELLANEOUS) ×1
SUCTION TUBE FRAZIER 10FR DISP (MISCELLANEOUS) ×1 IMPLANT
SUT MNCRL AB 4-0 PS2 18 (SUTURE) ×1 IMPLANT
SUT PROLENE 6 0 BV (SUTURE) IMPLANT
SUT SILK 3 0 (SUTURE)
SUT SILK 3-0 18XBRD TIE 12 (SUTURE) IMPLANT
SUT VIC AB 3-0 SH 27 (SUTURE) ×1
SUT VIC AB 3-0 SH 27X BRD (SUTURE) ×1 IMPLANT
SYR CONTROL 10ML LL (SYRINGE) ×1 IMPLANT
TOWEL GREEN STERILE (TOWEL DISPOSABLE) ×1 IMPLANT
WATER STERILE IRR 1000ML POUR (IV SOLUTION) ×1 IMPLANT

## 2021-11-26 NOTE — Anesthesia Preprocedure Evaluation (Signed)
Anesthesia Evaluation  Patient identified by MRN, date of birth, ID band Patient awake    Reviewed: Allergy & Precautions, H&P , NPO status , Patient's Chart, lab work & pertinent test results  Airway Mallampati: III  TM Distance: >3 FB Neck ROM: Full    Dental no notable dental hx. (+) Teeth Intact, Dental Advisory Given   Pulmonary neg pulmonary ROS,    Pulmonary exam normal breath sounds clear to auscultation       Cardiovascular hypertension, Pt. on medications + Peripheral Vascular Disease   Rhythm:Regular Rate:Normal     Neuro/Psych negative neurological ROS  negative psych ROS   GI/Hepatic Neg liver ROS, GERD  Medicated,  Endo/Other  diabetes, Insulin Dependent  Renal/GU negative Renal ROS  negative genitourinary   Musculoskeletal  (+) Arthritis , Osteoarthritis,    Abdominal   Peds  Hematology negative hematology ROS (+)   Anesthesia Other Findings   Reproductive/Obstetrics negative OB ROS                             Anesthesia Physical Anesthesia Plan  ASA: 3  Anesthesia Plan: MAC   Post-op Pain Management: Tylenol PO (pre-op)*   Induction: Intravenous  PONV Risk Score and Plan: 2 and Ondansetron and Propofol infusion  Airway Management Planned: Natural Airway and Simple Face Mask  Additional Equipment:   Intra-op Plan:   Post-operative Plan:   Informed Consent: I have reviewed the patients History and Physical, chart, labs and discussed the procedure including the risks, benefits and alternatives for the proposed anesthesia with the patient or authorized representative who has indicated his/her understanding and acceptance.     Dental advisory given  Plan Discussed with: CRNA  Anesthesia Plan Comments:         Anesthesia Quick Evaluation

## 2021-11-26 NOTE — Transfer of Care (Signed)
Immediate Anesthesia Transfer of Care Note  Patient: Kristi Campos  Procedure(s) Performed: BIOPSY TEMPORAL ARTERY (Left: Head)  Patient Location: PACU  Anesthesia Type:MAC  Level of Consciousness: awake and drowsy  Airway & Oxygen Therapy: Patient Spontanous Breathing  Post-op Assessment: Report given to RN and Post -op Vital signs reviewed and stable  Post vital signs: Reviewed and stable  Last Vitals:  Vitals Value Taken Time  BP 139/81 11/26/21 1024  Temp    Pulse 69 11/26/21 1026  Resp 27 11/26/21 1026  SpO2 96 % 11/26/21 1026  Vitals shown include unvalidated device data.  Last Pain:  Vitals:   11/26/21 0652  TempSrc:   PainSc: 0-No pain      Patients Stated Pain Goal: 0 (32/95/18 8416)  Complications: No notable events documented.

## 2021-11-26 NOTE — Anesthesia Postprocedure Evaluation (Signed)
Anesthesia Post Note  Patient: Geographical information systems officer  Procedure(s) Performed: BIOPSY TEMPORAL ARTERY (Left: Head)     Patient location during evaluation: PACU Anesthesia Type: MAC Level of consciousness: awake and alert Pain management: pain level controlled Vital Signs Assessment: post-procedure vital signs reviewed and stable Respiratory status: spontaneous breathing, nonlabored ventilation and respiratory function stable Cardiovascular status: stable and blood pressure returned to baseline Postop Assessment: no apparent nausea or vomiting Anesthetic complications: no   No notable events documented.  Last Vitals:  Vitals:   11/26/21 1038 11/26/21 1053  BP: (!) 150/83 (!) 162/81  Pulse: 61 (!) 57  Resp: 16 17  Temp:  (!) 36.4 C  SpO2: 97% 98%    Last Pain:  Vitals:   11/26/21 1053  TempSrc:   PainSc: 3                  Chelci [place],W. EDMOND

## 2021-11-26 NOTE — Op Note (Signed)
DATE OF SERVICE: 11/26/2021  PATIENT:  Kristi Campos  71 y.o. female  PRE-OPERATIVE DIAGNOSIS:  possible temporal arteritis  POST-OPERATIVE DIAGNOSIS:  Same  PROCEDURE:   Left temporal artery biopsy  SURGEON:  Surgeon(s) and Role:    * Massie Mees, [name], MD - Primary  ASSISTANT: none  ANESTHESIA:   local and MAC  EBL: minimal  BLOOD ADMINISTERED:none  DRAINS: none   LOCAL MEDICATIONS USED:  LIDOCAINE   SPECIMEN:  left temporal artery  COUNTS: confirmed correct.  TOURNIQUET:  none  PATIENT DISPOSITION:  PACU - hemodynamically stable.   Delay start of Pharmacological VTE agent (>24hrs) due to surgical blood loss or risk of bleeding: no  INDICATION FOR PROCEDURE: [name] is a 71 y.o. female with possible temporal arteritis. After careful discussion of risks, benefits, and alternatives the patient was offered left temporal arteritis. The patient understood and wished to proceed.  OPERATIVE FINDINGS: Unremarkable temporal artery biopsy.  Arterial flow confirmed in artery with Doppler machine before excision.  DESCRIPTION OF PROCEDURE: After identification of the patient in the pre-operative holding area, the patient was transferred to the operating room. The patient was positioned supine on the operating room table. Anesthesia was induced. The left temple was prepped and draped in standard fashion. A surgical pause was performed confirming correct patient, procedure, and operative location.  Using intraoperative ultrasound the course of the left temporal artery was mapped on the skin.  Incision was made with a 15 blade and carried down to subcutaneous tissue with Bovie electrocautery.  Temporal artery was identified coursing in a serpiginous fashion through the subcutaneous skin.  Several centimeter segment was isolated and encircled with silk.  A Doppler machine was placed on the artery and arterial flow auscultated.  I clamped the Candida artery with a brown forcep and  the signal was lost.  Satisfied we proceeded.  Silk suture was placed proximally distally and at a branch point and secured.  The specimen was excised.  Hemostasis was achieved.  The specimen was passed off the table for pathologic evaluation.  The wound was closed in layers using 3-0 Vicryl and 4-0 Monocryl.  Benzoin and Steri-Strips were applied.  Upon completion of the case instrument and sharps counts were confirmed correct. The patient was transferred to the PACU in good condition. I was present for all portions of the procedure.  [name]. Stanford Breed, MD [facility] Phone Number: [phone] 11/26/2021 10:20 AM

## 2021-11-26 NOTE — Anesthesia Procedure Notes (Signed)
Procedure Name: MAC Date/Time: 11/26/2021 9:30 AM  Performed by: Trinna Post., CRNAPre-anesthesia Checklist: Patient identified, Emergency Drugs available, Suction available, Patient being monitored and Timeout performed Patient Re-evaluated:Patient Re-evaluated prior to induction Oxygen Delivery Method: Simple face mask Preoxygenation: Pre-oxygenation with 100% oxygen Induction Type: IV induction Placement Confirmation: positive ETCO2

## 2021-11-26 NOTE — Interval H&P Note (Signed)
History and Physical Interval Note:  11/26/2021 8:51 AM  Kristi Campos  has presented today for surgery, with the diagnosis of Headache, rule out temporal arteritis.  The various methods of treatment have been discussed with the patient and family. After consideration of risks, benefits and other options for treatment, the patient has consented to  Procedure(s): BIOPSY TEMPORAL ARTERY (Left) as a surgical intervention.  The patient's history has been reviewed, patient examined, no change in status, stable for surgery.  I have reviewed the patient's chart and labs.  Questions were answered to the patient's satisfaction.     Cherre Robins

## 2021-11-27 ENCOUNTER — Ambulatory Visit: Payer: Medicare Other | Admitting: Internal Medicine

## 2021-11-27 ENCOUNTER — Encounter (HOSPITAL_COMMUNITY): Payer: Self-pay | Admitting: Vascular Surgery

## 2021-11-27 LAB — SURGICAL PATHOLOGY

## 2021-11-30 DIAGNOSIS — E119 Type 2 diabetes mellitus without complications: Secondary | ICD-10-CM | POA: Diagnosis not present

## 2021-11-30 DIAGNOSIS — R4782 Fluency disorder in conditions classified elsewhere: Secondary | ICD-10-CM | POA: Diagnosis not present

## 2021-11-30 DIAGNOSIS — R296 Repeated falls: Secondary | ICD-10-CM | POA: Diagnosis not present

## 2021-11-30 DIAGNOSIS — R269 Unspecified abnormalities of gait and mobility: Secondary | ICD-10-CM | POA: Diagnosis not present

## 2021-11-30 DIAGNOSIS — I10 Essential (primary) hypertension: Secondary | ICD-10-CM | POA: Diagnosis not present

## 2021-11-30 DIAGNOSIS — M6281 Muscle weakness (generalized): Secondary | ICD-10-CM | POA: Diagnosis not present

## 2021-11-30 DIAGNOSIS — E785 Hyperlipidemia, unspecified: Secondary | ICD-10-CM | POA: Diagnosis not present

## 2021-12-02 ENCOUNTER — Telehealth: Payer: Self-pay | Admitting: Internal Medicine

## 2021-12-02 DIAGNOSIS — E785 Hyperlipidemia, unspecified: Secondary | ICD-10-CM | POA: Diagnosis not present

## 2021-12-02 DIAGNOSIS — M6281 Muscle weakness (generalized): Secondary | ICD-10-CM | POA: Diagnosis not present

## 2021-12-02 DIAGNOSIS — R296 Repeated falls: Secondary | ICD-10-CM | POA: Diagnosis not present

## 2021-12-02 DIAGNOSIS — R269 Unspecified abnormalities of gait and mobility: Secondary | ICD-10-CM | POA: Diagnosis not present

## 2021-12-02 DIAGNOSIS — I10 Essential (primary) hypertension: Secondary | ICD-10-CM | POA: Diagnosis not present

## 2021-12-02 DIAGNOSIS — E119 Type 2 diabetes mellitus without complications: Secondary | ICD-10-CM | POA: Diagnosis not present

## 2021-12-02 DIAGNOSIS — R4782 Fluency disorder in conditions classified elsewhere: Secondary | ICD-10-CM | POA: Diagnosis not present

## 2021-12-02 NOTE — Telephone Encounter (Signed)
Phone call placed to patient today to go over the results of the temporal artery biopsy.  Patient's daughter answered.  I informed her that the biopsy came back negative for giant cell arthritis.  So we do not have to continue prednisone.  Patient has finished the prednisone that I had placed her on in the interim.  I think her symptoms are most likely due to underlaying PSP. Daughter asked about her kidney function as she had read the MyChart message.  Advised that her kidney function is not at 100%.  She should avoid taking any over-the-counter NSAIDs.  Make sure that she is drinking adequate water during the day. Patient has appointment scheduled with me again for 12/10/2021.  I told her that we can cancel that appointment and I will have them move her appointment out 3 months.  All questions were answered.

## 2021-12-02 NOTE — Telephone Encounter (Signed)
Home Health Verbal Orders - Caller/Agency: Wells Guiles with [facility] Callback [phone] Requesting Speech Therapy for Memory and Speech Processing issues Frequency: 2x a week for 9 wks

## 2021-12-02 NOTE — Telephone Encounter (Signed)
Called spoke w/ pt daughter informed of note per pcp to move appt to Jan. Pt daughter expressed understanding. Appt r/s and moved to Jan 12th , 2024 w/ pcp @ 9:10am. Pt daughter expressed understanding. ----DD,RMA

## 2021-12-03 ENCOUNTER — Ambulatory Visit: Payer: Medicare Other | Admitting: Internal Medicine

## 2021-12-03 DIAGNOSIS — H0288B Meibomian gland dysfunction left eye, upper and lower eyelids: Secondary | ICD-10-CM | POA: Diagnosis not present

## 2021-12-03 DIAGNOSIS — H35033 Hypertensive retinopathy, bilateral: Secondary | ICD-10-CM | POA: Diagnosis not present

## 2021-12-03 DIAGNOSIS — E119 Type 2 diabetes mellitus without complications: Secondary | ICD-10-CM | POA: Diagnosis not present

## 2021-12-03 DIAGNOSIS — H2513 Age-related nuclear cataract, bilateral: Secondary | ICD-10-CM | POA: Diagnosis not present

## 2021-12-03 DIAGNOSIS — H0288A Meibomian gland dysfunction right eye, upper and lower eyelids: Secondary | ICD-10-CM | POA: Diagnosis not present

## 2021-12-03 NOTE — Telephone Encounter (Signed)
V.O given to [facility]:  [place] Verbal Orders - Caller/Agency: Wells Guiles with [facility] Callback [phone] Requesting Speech Therapy for Memory and Speech Processing issues Frequency: 2x a week for 9 wks

## 2021-12-03 NOTE — Progress Notes (Signed)
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  Understands that there is an increased risk of falls, aspiration as subsequent morbidity and mortality from this disease compared to Parkinson's disease.  -They understand that oftentimes levodopa does not help in this state, but she thinks helping so we will restart carbidopa/levodopa tid.  -Discussed MBE, but she really is asymptomatic right now so we decided to hold off on that.  -Patient does not drive.  She does live by herself, but daughter lives next door.  -refer to PT for mobility eval  -Request power wheelchair.  Disease produces retropulsion with falls backward.  Deleo no value for this.  Cannot mount scooter.  Inability to coordinate/endure to use manual.    -While she currently has the ability to pressure relief, she likely will not in the future given degenerative condition.  Would request ability to tilt and recline power wheelchair.  2.  Eyelid opening apraxia  -Not interested in Botox right now.  3.  Vision change/generalized fatigue jaw claudication  -TA biopsy negative  -Her vision issues really sound most consistent with PSP.  She has diplopia and bilateral blurry vision (which is likely a combination of cataracts as well as PSP issues).  She is having no unilateral vision change.   4.  Dizziness  -This is likely the result of orthostasis/dysautonomia from #1. Better after BP meds decreased    Subjective:   Kristi Campos was seen today in follow-up for wheelchair evaluation.  Patient presents for a mobility evaluation.  I have reviewed and agree with PT evaluation.  Pt needs power WC as she has difficulty getting to bathroom on time. Pt having mild trouble navigating getting in and out of shower and power WC would be of great benefit with this task that [place] is of no benefit for.  Pt has the mental capability to use the power WC.  We will get physical  therapy to demonstrate this as well.  Pt cannot use manual WC because of endurance abilities to propel and poor coordination of UE's.  She could not mount a scooter/transfer onto safely, hence making it inappropriate for this patient.   hx of falls already with traditional [place] and rollator.  Last fall was 2 months ago and fell backwards.  Dizziness is improved with decreasing BP  Separately, last visit, there was complaints about some potential jaw claudication.  Sedimentation rate was really very normal as well as CRP.  Patient had declined biopsy when she was in here, but after communicating with the primary care physician, the patient and daughter decided to proceed with the biopsy.  The biopsy was not completed until September 28 (she was seen by vascular September 12).  The biopsy was negative.  She does state that she feels worse off of carbidopa/levodopa.  Been off since last visit.  Thought that we wanted her off of it.      Current prescribed movement disorder medications: Carbidopa/levodopa 25/100, 1 tablet 3 times per day.    ALLERGIES:   Allergies  Allergen Reactions   Metformin And Related Diarrhea    CURRENT MEDICATIONS:  Outpatient Encounter Medications as of 12/08/2021  Medication Sig   aspirin EC 81 MG tablet Take 81 mg by mouth daily.   atorvastatin (LIPITOR) 10 MG tablet Take 1 tablet (10 mg total) by mouth daily.   Blood Glucose Monitoring Suppl (ACCU-CHEK GUIDE) w/Device KIT Use to check blood sugar TID. Dx E11.69  Continuous Blood Gluc Receiver (FREESTYLE LIBRE READER) DEVI UAD   Continuous Blood Gluc Sensor (FREESTYLE LIBRE SENSOR SYSTEM) MISC Change sensor Q 2 wks   docusate sodium (COLACE) 100 MG capsule Take 1 capsule (100 mg total) by mouth every 12 (twelve) hours.   glucose blood (ACCU-CHEK GUIDE) test strip Use to check blood sugar TID. Dx E11.69   insulin glargine (LANTUS SOLOSTAR) 100 UNIT/ML Solostar Pen Inject 15 Units into the skin daily.   insulin  lispro (HUMALOG KWIKPEN) 100 UNIT/ML KwikPen Give with meals. If BS 200-250 give 2 units Subcut; BS 251-300 4 units, BS 301-350 6 units, 351-400 8 units, 401-450 10 units, >450 give 13 units.   Insulin Pen Needle (PEN NEEDLES) 31G X 8 MM MISC UAD   JARDIANCE 25 MG TABS tablet Take 1 tablet (25 mg total) by mouth daily.   losartan-hydrochlorothiazide (HYZAAR) 100-25 MG tablet Take 1/2 tablet PO daily   pantoprazole (PROTONIX) 40 MG tablet TAKE 1 TABLET BY MOUTH DAILY AT 12 NOON   Semaglutide,0.25 or 0.5MG /DOS, (OZEMPIC, 0.25 OR 0.5 MG/DOSE,) 2 MG/3ML SOPN Inject 0.25 mg weekly subq once weekly for 4 weeks. Then, increase to 0.5 mg weekly thereafter. (Patient taking differently: Inject 0.25 mg into the skin every Tuesday. Inject 0.25 mg weekly subq once weekly for 4 weeks. Then, increase to 0.5 mg weekly thereafter.)   traMADol (ULTRAM) 50 MG tablet Take 1 tablet (50 mg total) by mouth every 6 (six) hours as needed.   nitroGLYCERIN (NITROSTAT) 0.4 MG SL tablet Place 1 tablet (0.4 mg total) under the tongue every 5 (five) minutes as needed for chest pain. (Patient not taking: Reported on 12/08/2021)   predniSONE (DELTASONE) 20 MG tablet 30 tabs PO daily (Patient not taking: Reported on 12/08/2021)   [DISCONTINUED] simvastatin (ZOCOR) 10 MG tablet Take 10 mg by mouth at bedtime.   No facility-administered encounter medications on file as of 12/08/2021.    Objective:   PHYSICAL EXAMINATION:    VITALS:   Vitals:   12/08/21 0730  BP: 113/80  Pulse: 75  Resp: 20  SpO2: 96%  Weight: 181 lb (82.1 kg)  Height: 5\' 3"  (1.6 m)      GEN:  The patient appears stated age and is in NAD. HEENT:  Normocephalic, atraumatic.  The mucous membranes are moist. The superficial temporal arteries are without ropiness or tenderness.  No STA artery bruits.  No jaw tenderness with opening/closing or palpation CV  RRR Lungs:  CTAB Neck:  no bruits   Neurological examination:  Orientation: The patient is  alert and oriented x3.  Cranial nerves: There is good facial symmetry. There is facial hypomimia.  Extraocular muscles are intact.  Downgaze was just slightly decreased. No significant square wave jerks.  The visual fields are full to confrontational testing. The speech is fluent and dysarthric. some stuttering (same as previous).  She has trouble with the gutteral sounds.  Soft palate rises symmetrically and there is no tongue deviation. Hearing is intact to conversational tone. Sensation: Sensation is intact to light touch Motor: Strength is 5/5 in the bilateral upper and lower extremities.   Shoulder shrug is equal and symmetric.  There is no pronator drift.    Movement examination: Tone: There is normal tone in the bilateral upper extremities.  The tone in the lower extremities is normal.  Abnormal movements: None Coordination:  There is decremation, with any form of RAMS, including alternating supination and pronation of the forearm, hand opening and closing, finger taps, heel  taps and toe taps on the L Gait and Station: The patient has difficulty arising out of a deep-seated chair without the use of the hands.  She has start hesitation.  Gait is shuffling.  She walks on the toes, especially on the right.  She turns en bloc and freezes in the turn.  The R foot sticks to the floor in the turn.   I have reviewed and interpreted the following labs independently    Chemistry      Component Value Date/Time   NA 142 11/26/2021 0715   NA 144 11/23/2021 1118   K 3.7 11/26/2021 0715   CL 105 11/26/2021 0715   CO2 24 11/23/2021 1118   BUN 26 (H) 11/26/2021 0715   BUN 17 11/23/2021 1118   CREATININE 1.00 11/26/2021 0715      Component Value Date/Time   CALCIUM 10.6 (H) 11/23/2021 1118   ALKPHOS 87 10/22/2021 1134   AST 17 10/22/2021 1134   ALT 18 10/22/2021 1134   BILITOT 0.3 10/22/2021 1134       Lab Results  Component Value Date   WBC 7.3 10/22/2021   HGB 13.9 11/26/2021   HCT  41.0 11/26/2021   MCV 92 10/22/2021   PLT 277 10/22/2021    Lab Results  Component Value Date   TSH 0.984 07/23/2021   Lab Results  Component Value Date   ESRSEDRATE 32 11/23/2021   Lab Results  Component Value Date   CRP 7 11/23/2021     Total time spent on today's visit was 31 minutes, including both face-to-face time and nonface-to-face time.  Time included that spent on review of records (prior notes available to me/labs/imaging if pertinent), discussing treatment and goals, answering patient's questions and coordinating care.  Cc:  Ladell Pier, MD

## 2021-12-07 DIAGNOSIS — R269 Unspecified abnormalities of gait and mobility: Secondary | ICD-10-CM | POA: Diagnosis not present

## 2021-12-07 DIAGNOSIS — E119 Type 2 diabetes mellitus without complications: Secondary | ICD-10-CM | POA: Diagnosis not present

## 2021-12-07 DIAGNOSIS — I10 Essential (primary) hypertension: Secondary | ICD-10-CM | POA: Diagnosis not present

## 2021-12-07 DIAGNOSIS — M6281 Muscle weakness (generalized): Secondary | ICD-10-CM | POA: Diagnosis not present

## 2021-12-07 DIAGNOSIS — E785 Hyperlipidemia, unspecified: Secondary | ICD-10-CM | POA: Diagnosis not present

## 2021-12-07 DIAGNOSIS — R296 Repeated falls: Secondary | ICD-10-CM | POA: Diagnosis not present

## 2021-12-07 DIAGNOSIS — R4782 Fluency disorder in conditions classified elsewhere: Secondary | ICD-10-CM | POA: Diagnosis not present

## 2021-12-08 ENCOUNTER — Other Ambulatory Visit: Payer: Self-pay

## 2021-12-08 ENCOUNTER — Encounter: Payer: Self-pay | Admitting: Neurology

## 2021-12-08 ENCOUNTER — Ambulatory Visit (INDEPENDENT_AMBULATORY_CARE_PROVIDER_SITE_OTHER): Payer: Medicare Other | Admitting: Neurology

## 2021-12-08 VITALS — BP 113/80 | HR 75 | Resp 20 | Ht 63.0 in | Wt 181.0 lb

## 2021-12-08 DIAGNOSIS — G231 Progressive supranuclear ophthalmoplegia [Steele-Richardson-Olszewski]: Secondary | ICD-10-CM

## 2021-12-08 MED ORDER — CARBIDOPA-LEVODOPA 25-100 MG PO TABS: 1.0000 | ORAL_TABLET | Freq: Three times a day (TID) | ORAL | 1 refills | Status: DC

## 2021-12-08 NOTE — Patient Instructions (Signed)
The physicians and staff at [facility] are committed to providing excellent care. You may receive a survey requesting feedback about your experience at our office. We strive to receive "very good" responses to the survey questions. If you feel that your experience would prevent you from giving the office a "very good " response, please contact our office to try to remedy the situation. We may be reached at [phone]. Thank you for taking the time out of your busy day to complete the survey.  

## 2021-12-09 DIAGNOSIS — R296 Repeated falls: Secondary | ICD-10-CM | POA: Diagnosis not present

## 2021-12-09 DIAGNOSIS — E785 Hyperlipidemia, unspecified: Secondary | ICD-10-CM | POA: Diagnosis not present

## 2021-12-09 DIAGNOSIS — M6281 Muscle weakness (generalized): Secondary | ICD-10-CM | POA: Diagnosis not present

## 2021-12-09 DIAGNOSIS — E119 Type 2 diabetes mellitus without complications: Secondary | ICD-10-CM | POA: Diagnosis not present

## 2021-12-09 DIAGNOSIS — R269 Unspecified abnormalities of gait and mobility: Secondary | ICD-10-CM | POA: Diagnosis not present

## 2021-12-09 DIAGNOSIS — R4782 Fluency disorder in conditions classified elsewhere: Secondary | ICD-10-CM | POA: Diagnosis not present

## 2021-12-09 DIAGNOSIS — I10 Essential (primary) hypertension: Secondary | ICD-10-CM | POA: Diagnosis not present

## 2021-12-10 ENCOUNTER — Ambulatory Visit: Payer: Medicare Other | Admitting: Internal Medicine

## 2021-12-14 DIAGNOSIS — I10 Essential (primary) hypertension: Secondary | ICD-10-CM | POA: Diagnosis not present

## 2021-12-14 DIAGNOSIS — E119 Type 2 diabetes mellitus without complications: Secondary | ICD-10-CM | POA: Diagnosis not present

## 2021-12-14 DIAGNOSIS — R269 Unspecified abnormalities of gait and mobility: Secondary | ICD-10-CM | POA: Diagnosis not present

## 2021-12-14 DIAGNOSIS — R296 Repeated falls: Secondary | ICD-10-CM | POA: Diagnosis not present

## 2021-12-14 DIAGNOSIS — G20A1 Parkinson's disease without dyskinesia, without mention of fluctuations: Secondary | ICD-10-CM | POA: Diagnosis not present

## 2021-12-14 DIAGNOSIS — M6281 Muscle weakness (generalized): Secondary | ICD-10-CM | POA: Diagnosis not present

## 2021-12-14 DIAGNOSIS — E785 Hyperlipidemia, unspecified: Secondary | ICD-10-CM | POA: Diagnosis not present

## 2021-12-14 DIAGNOSIS — R4782 Fluency disorder in conditions classified elsewhere: Secondary | ICD-10-CM | POA: Diagnosis not present

## 2021-12-16 DIAGNOSIS — E785 Hyperlipidemia, unspecified: Secondary | ICD-10-CM | POA: Diagnosis not present

## 2021-12-16 DIAGNOSIS — E119 Type 2 diabetes mellitus without complications: Secondary | ICD-10-CM | POA: Diagnosis not present

## 2021-12-16 DIAGNOSIS — G20A1 Parkinson's disease without dyskinesia, without mention of fluctuations: Secondary | ICD-10-CM | POA: Diagnosis not present

## 2021-12-16 DIAGNOSIS — I10 Essential (primary) hypertension: Secondary | ICD-10-CM | POA: Diagnosis not present

## 2021-12-16 DIAGNOSIS — R269 Unspecified abnormalities of gait and mobility: Secondary | ICD-10-CM | POA: Diagnosis not present

## 2021-12-16 DIAGNOSIS — R296 Repeated falls: Secondary | ICD-10-CM | POA: Diagnosis not present

## 2021-12-16 DIAGNOSIS — M6281 Muscle weakness (generalized): Secondary | ICD-10-CM | POA: Diagnosis not present

## 2021-12-16 DIAGNOSIS — R4782 Fluency disorder in conditions classified elsewhere: Secondary | ICD-10-CM | POA: Diagnosis not present

## 2021-12-20 ENCOUNTER — Other Ambulatory Visit: Payer: Self-pay | Admitting: Internal Medicine

## 2021-12-20 DIAGNOSIS — E1169 Type 2 diabetes mellitus with other specified complication: Secondary | ICD-10-CM

## 2021-12-21 ENCOUNTER — Encounter: Payer: Self-pay | Admitting: Obstetrics and Gynecology

## 2021-12-21 ENCOUNTER — Ambulatory Visit (INDEPENDENT_AMBULATORY_CARE_PROVIDER_SITE_OTHER): Payer: Medicare Other | Admitting: Obstetrics and Gynecology

## 2021-12-21 VITALS — BP 124/80 | HR 96 | Ht 63.0 in | Wt 189.0 lb

## 2021-12-21 DIAGNOSIS — I10 Essential (primary) hypertension: Secondary | ICD-10-CM | POA: Diagnosis not present

## 2021-12-21 DIAGNOSIS — E785 Hyperlipidemia, unspecified: Secondary | ICD-10-CM | POA: Diagnosis not present

## 2021-12-21 DIAGNOSIS — R296 Repeated falls: Secondary | ICD-10-CM | POA: Diagnosis not present

## 2021-12-21 DIAGNOSIS — R269 Unspecified abnormalities of gait and mobility: Secondary | ICD-10-CM | POA: Diagnosis not present

## 2021-12-21 DIAGNOSIS — M6281 Muscle weakness (generalized): Secondary | ICD-10-CM | POA: Diagnosis not present

## 2021-12-21 DIAGNOSIS — Z01419 Encounter for gynecological examination (general) (routine) without abnormal findings: Secondary | ICD-10-CM | POA: Diagnosis not present

## 2021-12-21 NOTE — Progress Notes (Signed)
Subjective:     Kristi Campos is a 71 y.o. female postmenopausal with BMI 33 who is here for a comprehensive physical exam. The patient reports no problems. She denies any episodes of vaginal bleeding. She denies pelvic pain or abnormal discharge. She is not sexually active. She denies urinary incontinence or issues with her bowel movement. She lives alone and cares for herself  Past Medical History:  Diagnosis Date   Diabetes mellitus without complication ([place])    Fibroid uterus    GERD (gastroesophageal reflux disease)    Hypertension    Vaginal Pap smear, abnormal    Past Surgical History:  Procedure Laterality Date   ARTERY BIOPSY Left 11/26/2021   Procedure: BIOPSY TEMPORAL ARTERY;  Surgeon: Cherre Robins, MD;  Location: [place];  Service: Vascular;  Laterality: Left;   COLONOSCOPY     I & D EXTREMITY Right 08/09/2013   Procedure: MINOR IRRIGATION AND DEBRIDEMENT EXTREMITY;  Surgeon: Cammie Sickle, MD;  Location: [place];  Service: Orthopedics;  Laterality: Right;  long       wound class 4   Family History  Problem Relation Age of Onset   Bipolar disorder Mother    Hypertension Sister    Hypertension Sister    Hypertension Sister    Hypertension Sister    Hypertension Sister    Hypertension Sister    Hypertension Daughter    Diabetes Son    Prostate cancer Son     Social History   Socioeconomic History   Marital status: Widowed    Spouse name: Not on file   Number of children: 4   Years of education: Not on file   Highest education level: Not on file  Occupational History    Employer: other   Occupation: retired    Comment: weave for living  Tobacco Use   Smoking status: Never   Smokeless tobacco: Never  Vaping Use   Vaping Use: Never used  Substance and Sexual Activity   Alcohol use: No    Alcohol/week: 0.0 standard drinks of alcohol   Drug use: No   Sexual activity: Not Currently    Partners: Male    Birth control/protection:  Post-menopausal  Other Topics Concern   Not on file  Social History Narrative   Right handed    Lives at home alone ONE LEVEL   Caffeine1 cup daily   Social Determinants of Health   Financial Resource Strain: Not on file  Food Insecurity: Not on file  Transportation Needs: Not on file  Physical Activity: Not on file  Stress: Not on file  Social Connections: Not on file  Intimate Partner Violence: Not on file   Health Maintenance  Topic Date Due   Hepatitis C Screening  Never done   COVID-19 Vaccine (3 - Pfizer risk series) 05/20/2020   OPHTHALMOLOGY EXAM  09/29/2021   Diabetic kidney evaluation - Urine ACR  01/27/2022   HEMOGLOBIN A1C  04/03/2022   FOOT EXAM  07/24/2022   Diabetic kidney evaluation - GFR measurement  11/24/2022   MAMMOGRAM  01/30/2023   TETANUS/TDAP  11/23/2028   COLONOSCOPY (Pts 45-44yr Insurance coverage will need to be confirmed)  06/17/2029   Pneumonia Vaccine 71 Years old  Completed   INFLUENZA VACCINE  Completed   DEXA SCAN  Completed   Zoster Vaccines- Shingrix  Completed   HPV VACCINES  Aged Out       Review of Systems Pertinent items noted in HPI and remainder  of comprehensive ROS otherwise negative.   Objective:  Blood pressure 124/80, pulse 96, height '5\' 3"'$  (1.6 m), weight 189 lb (85.7 kg).   GENERAL: Well-developed, well-nourished female in no acute distress.  HEENT: Normocephalic, atraumatic. Sclerae anicteric.  NECK: Supple. Normal thyroid.  LUNGS: Clear to auscultation bilaterally.  HEART: Regular rate and rhythm. BREASTS: Symmetric in size. No palpable masses or lymphadenopathy, skin changes, or nipple drainage. ABDOMEN: Soft, nontender, nondistended. No organomegaly. PELVIC: Normal external female genitalia. Vagina is pale and atrophic.  Normal discharge. Normal appearing cervix. Bimanual exam limited secondary to body habitus. No adnexal mass or tenderness. Chaperone present during the pelvic exam EXTREMITIES: No cyanosis,  clubbing, or edema, 2+ distal pulses.     Assessment:    Healthy female exam.      Plan:    Pap smear no longer indicated Screening mammogram due in December Patient had colonoscopy done in 2021 Follow up as planned with PCP See After Visit Summary for Counseling Recommendations

## 2021-12-21 NOTE — Progress Notes (Signed)
71 y.o. GYN presents for AEX.  Last Mammogram 01/29/2021 Negative

## 2021-12-23 DIAGNOSIS — E785 Hyperlipidemia, unspecified: Secondary | ICD-10-CM | POA: Diagnosis not present

## 2021-12-23 DIAGNOSIS — R296 Repeated falls: Secondary | ICD-10-CM | POA: Diagnosis not present

## 2021-12-23 DIAGNOSIS — M6281 Muscle weakness (generalized): Secondary | ICD-10-CM | POA: Diagnosis not present

## 2021-12-23 DIAGNOSIS — R269 Unspecified abnormalities of gait and mobility: Secondary | ICD-10-CM | POA: Diagnosis not present

## 2021-12-23 DIAGNOSIS — I10 Essential (primary) hypertension: Secondary | ICD-10-CM | POA: Diagnosis not present

## 2021-12-28 DIAGNOSIS — R269 Unspecified abnormalities of gait and mobility: Secondary | ICD-10-CM | POA: Diagnosis not present

## 2021-12-28 DIAGNOSIS — R296 Repeated falls: Secondary | ICD-10-CM | POA: Diagnosis not present

## 2021-12-28 DIAGNOSIS — I10 Essential (primary) hypertension: Secondary | ICD-10-CM | POA: Diagnosis not present

## 2021-12-28 DIAGNOSIS — E119 Type 2 diabetes mellitus without complications: Secondary | ICD-10-CM | POA: Diagnosis not present

## 2021-12-28 DIAGNOSIS — E785 Hyperlipidemia, unspecified: Secondary | ICD-10-CM | POA: Diagnosis not present

## 2021-12-28 DIAGNOSIS — R4782 Fluency disorder in conditions classified elsewhere: Secondary | ICD-10-CM | POA: Diagnosis not present

## 2021-12-28 DIAGNOSIS — G20A1 Parkinson's disease without dyskinesia, without mention of fluctuations: Secondary | ICD-10-CM | POA: Diagnosis not present

## 2021-12-28 DIAGNOSIS — M6281 Muscle weakness (generalized): Secondary | ICD-10-CM | POA: Diagnosis not present

## 2021-12-30 DIAGNOSIS — I10 Essential (primary) hypertension: Secondary | ICD-10-CM | POA: Diagnosis not present

## 2021-12-30 DIAGNOSIS — R296 Repeated falls: Secondary | ICD-10-CM | POA: Diagnosis not present

## 2021-12-30 DIAGNOSIS — E119 Type 2 diabetes mellitus without complications: Secondary | ICD-10-CM | POA: Diagnosis not present

## 2021-12-30 DIAGNOSIS — M6281 Muscle weakness (generalized): Secondary | ICD-10-CM | POA: Diagnosis not present

## 2021-12-30 DIAGNOSIS — R269 Unspecified abnormalities of gait and mobility: Secondary | ICD-10-CM | POA: Diagnosis not present

## 2021-12-30 DIAGNOSIS — G20A1 Parkinson's disease without dyskinesia, without mention of fluctuations: Secondary | ICD-10-CM | POA: Diagnosis not present

## 2021-12-30 DIAGNOSIS — R4782 Fluency disorder in conditions classified elsewhere: Secondary | ICD-10-CM | POA: Diagnosis not present

## 2021-12-30 DIAGNOSIS — E785 Hyperlipidemia, unspecified: Secondary | ICD-10-CM | POA: Diagnosis not present

## 2022-01-04 ENCOUNTER — Telehealth: Payer: Self-pay | Admitting: Emergency Medicine

## 2022-01-04 DIAGNOSIS — R296 Repeated falls: Secondary | ICD-10-CM | POA: Diagnosis not present

## 2022-01-04 DIAGNOSIS — R269 Unspecified abnormalities of gait and mobility: Secondary | ICD-10-CM | POA: Diagnosis not present

## 2022-01-04 DIAGNOSIS — M6281 Muscle weakness (generalized): Secondary | ICD-10-CM | POA: Diagnosis not present

## 2022-01-04 DIAGNOSIS — R4782 Fluency disorder in conditions classified elsewhere: Secondary | ICD-10-CM | POA: Diagnosis not present

## 2022-01-04 DIAGNOSIS — E785 Hyperlipidemia, unspecified: Secondary | ICD-10-CM | POA: Diagnosis not present

## 2022-01-04 DIAGNOSIS — I10 Essential (primary) hypertension: Secondary | ICD-10-CM | POA: Diagnosis not present

## 2022-01-04 DIAGNOSIS — G20A1 Parkinson's disease without dyskinesia, without mention of fluctuations: Secondary | ICD-10-CM | POA: Diagnosis not present

## 2022-01-04 DIAGNOSIS — E119 Type 2 diabetes mellitus without complications: Secondary | ICD-10-CM | POA: Diagnosis not present

## 2022-01-04 NOTE — Telephone Encounter (Signed)
Copied from [place] [phone]. Topic: General - Other >> Jan 04, 2022  3:29 PM Ludger Nutting wrote: Wells Guiles with [facility] stated that patient reported a fall on 01/03/22. Patient states that she was not injured in fall.

## 2022-01-05 NOTE — Telephone Encounter (Signed)
FYI

## 2022-01-06 DIAGNOSIS — I10 Essential (primary) hypertension: Secondary | ICD-10-CM | POA: Diagnosis not present

## 2022-01-06 DIAGNOSIS — R4782 Fluency disorder in conditions classified elsewhere: Secondary | ICD-10-CM | POA: Diagnosis not present

## 2022-01-06 DIAGNOSIS — G20A1 Parkinson's disease without dyskinesia, without mention of fluctuations: Secondary | ICD-10-CM | POA: Diagnosis not present

## 2022-01-06 DIAGNOSIS — E785 Hyperlipidemia, unspecified: Secondary | ICD-10-CM | POA: Diagnosis not present

## 2022-01-06 DIAGNOSIS — R296 Repeated falls: Secondary | ICD-10-CM | POA: Diagnosis not present

## 2022-01-06 DIAGNOSIS — E119 Type 2 diabetes mellitus without complications: Secondary | ICD-10-CM | POA: Diagnosis not present

## 2022-01-06 DIAGNOSIS — M6281 Muscle weakness (generalized): Secondary | ICD-10-CM | POA: Diagnosis not present

## 2022-01-06 DIAGNOSIS — R269 Unspecified abnormalities of gait and mobility: Secondary | ICD-10-CM | POA: Diagnosis not present

## 2022-01-14 DIAGNOSIS — M5136 Other intervertebral disc degeneration, lumbar region: Secondary | ICD-10-CM | POA: Diagnosis not present

## 2022-01-14 DIAGNOSIS — M47896 Other spondylosis, lumbar region: Secondary | ICD-10-CM | POA: Diagnosis not present

## 2022-01-14 DIAGNOSIS — M5416 Radiculopathy, lumbar region: Secondary | ICD-10-CM | POA: Diagnosis not present

## 2022-01-14 DIAGNOSIS — M5451 Vertebrogenic low back pain: Secondary | ICD-10-CM | POA: Diagnosis not present

## 2022-01-19 ENCOUNTER — Other Ambulatory Visit: Payer: Self-pay | Admitting: Internal Medicine

## 2022-01-19 DIAGNOSIS — K219 Gastro-esophageal reflux disease without esophagitis: Secondary | ICD-10-CM

## 2022-01-25 DIAGNOSIS — R29898 Other symptoms and signs involving the musculoskeletal system: Secondary | ICD-10-CM | POA: Diagnosis not present

## 2022-01-25 DIAGNOSIS — G231 Progressive supranuclear ophthalmoplegia [Steele-Richardson-Olszewski]: Secondary | ICD-10-CM | POA: Diagnosis not present

## 2022-02-19 ENCOUNTER — Other Ambulatory Visit: Payer: Self-pay | Admitting: Internal Medicine

## 2022-02-19 DIAGNOSIS — E669 Obesity, unspecified: Secondary | ICD-10-CM

## 2022-03-02 ENCOUNTER — Ambulatory Visit
Admission: RE | Admit: 2022-03-02 | Discharge: 2022-03-02 | Disposition: A | Discharge status: Home / Self Care | Payer: Medicare Other | Source: Ambulatory Visit | Attending: Obstetrics and Gynecology | Admitting: Obstetrics and Gynecology

## 2022-03-02 DIAGNOSIS — Z01419 Encounter for gynecological examination (general) (routine) without abnormal findings: Secondary | ICD-10-CM

## 2022-03-05 ENCOUNTER — Other Ambulatory Visit: Payer: Self-pay | Admitting: Internal Medicine

## 2022-03-05 DIAGNOSIS — E1169 Type 2 diabetes mellitus with other specified complication: Secondary | ICD-10-CM

## 2022-03-05 NOTE — Telephone Encounter (Signed)
Requested Prescriptions  Pending Prescriptions Disp Refills   JARDIANCE 25 MG TABS tablet [Pharmacy Med Name: JARDIANCE '25MG'$  TABLETS] 90 tablet 0    Sig: TAKE 1 TABLET(25 MG) BY MOUTH DAILY     Endocrinology:  Diabetes - SGLT2 Inhibitors Failed - 03/05/2022 10:17 AM      Failed - HBA1C is between 0 and 7.9 and within 180 days    HbA1c, POC (controlled diabetic range)  Date Value Ref Range Status  10/01/2021 11.1 (A) 0.0 - 7.0 % Final         Failed - eGFR in normal range and within 360 days    GFR calc Af Amer  Date Value Ref Range Status  12/06/2019 74 >59 mL/min/1.73 Final    Comment:    **[facility] currently reports eGFR in compliance with the current**   recommendations of the [employer]. [facility] will   update reporting as new guidelines are published from the NKF-ASN   Task force.    GFR, Estimated  Date Value Ref Range Status  10/18/2020 >60 >60 mL/min Final    Comment:    (NOTE) Calculated using the CKD-EPI Creatinine Equation (2021)    eGFR  Date Value Ref Range Status  11/23/2021 42 (L) >59 mL/min/1.73 Final         Passed - Cr in normal range and within 360 days    Creatinine, Ser  Date Value Ref Range Status  11/26/2021 1.00 0.44 - 1.00 mg/dL Final   Creatinine,U  Date Value Ref Range Status  10/24/2018 67.4 mg/dL Final         Passed - Valid encounter within last 6 months    Recent Outpatient Visits           3 months ago Jaw claudication   [place], Deborah B, MD   3 months ago Diabetes mellitus type 2 in obese [facility])   [place], Jarome Matin, RPH-CPP   4 months ago Fatigue, unspecified type   [place], MD   5 months ago Recurrent falls   [place], MD   7 months ago Diabetes mellitus type 2 in obese [facility])   [place], MD       Future Appointments             In 1 week Ladell Pier, MD [place]   In 1 month Gwenlyn Found, Pearletha Forge, MD [facility]

## 2022-03-12 ENCOUNTER — Ambulatory Visit: Payer: Medicare Other | Admitting: Internal Medicine

## 2022-04-02 ENCOUNTER — Telehealth: Payer: Self-pay | Admitting: Internal Medicine

## 2022-04-02 NOTE — Telephone Encounter (Signed)
Left message for patient to call back and schedule Medicare Annual Wellness Visit (AWV) either virtually or phone  Left  my Kristi Campos number [phone]   Last WTM 03/31/21

## 2022-04-12 ENCOUNTER — Other Ambulatory Visit: Payer: Self-pay

## 2022-04-21 ENCOUNTER — Emergency Department (HOSPITAL_COMMUNITY): Payer: 59

## 2022-04-21 ENCOUNTER — Encounter (HOSPITAL_COMMUNITY): Payer: Self-pay | Admitting: Emergency Medicine

## 2022-04-21 ENCOUNTER — Emergency Department (HOSPITAL_COMMUNITY)
Admission: EM | Admit: 2022-04-21 | Discharge: 2022-04-21 | Disposition: A | Discharge status: Home / Self Care | Payer: 59 | Attending: Emergency Medicine | Admitting: Emergency Medicine

## 2022-04-21 DIAGNOSIS — R200 Anesthesia of skin: Secondary | ICD-10-CM | POA: Insufficient documentation

## 2022-04-21 DIAGNOSIS — R079 Chest pain, unspecified: Secondary | ICD-10-CM | POA: Diagnosis not present

## 2022-04-21 DIAGNOSIS — Z794 Long term (current) use of insulin: Secondary | ICD-10-CM | POA: Insufficient documentation

## 2022-04-21 DIAGNOSIS — Z79899 Other long term (current) drug therapy: Secondary | ICD-10-CM | POA: Diagnosis not present

## 2022-04-21 DIAGNOSIS — R29818 Other symptoms and signs involving the nervous system: Secondary | ICD-10-CM | POA: Insufficient documentation

## 2022-04-21 DIAGNOSIS — Z743 Need for continuous supervision: Secondary | ICD-10-CM | POA: Diagnosis not present

## 2022-04-21 DIAGNOSIS — Z7982 Long term (current) use of aspirin: Secondary | ICD-10-CM | POA: Insufficient documentation

## 2022-04-21 DIAGNOSIS — E119 Type 2 diabetes mellitus without complications: Secondary | ICD-10-CM | POA: Insufficient documentation

## 2022-04-21 DIAGNOSIS — R4789 Other speech disturbances: Secondary | ICD-10-CM | POA: Diagnosis not present

## 2022-04-21 DIAGNOSIS — R9431 Abnormal electrocardiogram [ECG] [EKG]: Secondary | ICD-10-CM | POA: Insufficient documentation

## 2022-04-21 DIAGNOSIS — R0789 Other chest pain: Secondary | ICD-10-CM | POA: Insufficient documentation

## 2022-04-21 DIAGNOSIS — K219 Gastro-esophageal reflux disease without esophagitis: Secondary | ICD-10-CM | POA: Diagnosis not present

## 2022-04-21 DIAGNOSIS — I10 Essential (primary) hypertension: Secondary | ICD-10-CM | POA: Insufficient documentation

## 2022-04-21 DIAGNOSIS — R6889 Other general symptoms and signs: Secondary | ICD-10-CM | POA: Diagnosis not present

## 2022-04-21 LAB — COMPREHENSIVE METABOLIC PANEL
ALT: <5 U/L (ref 0–44)
AST: 17 U/L (ref 15–41)
Albumin: 3.4 g/dL — ABNORMAL LOW (ref 3.5–5.0)
Alkaline Phosphatase: 61 U/L (ref 38–126)
Anion gap: 9 (ref 5–15)
BUN: 17 mg/dL (ref 8–23)
CO2: 25 mmol/L (ref 22–32)
Calcium: 9.1 mg/dL (ref 8.9–10.3)
Chloride: 104 mmol/L (ref 98–111)
Creatinine, Ser: 1.17 mg/dL — ABNORMAL HIGH (ref 0.44–1.00)
GFR, Estimated: 50 mL/min — ABNORMAL LOW (ref >60)
Glucose, Bld: 137 mg/dL — ABNORMAL HIGH (ref 70–99)
Potassium: 3.9 mmol/L (ref 3.5–5.1)
Sodium: 138 mmol/L (ref 135–145)
Total Bilirubin: 0.8 mg/dL (ref 0.3–1.2)
Total Protein: 7.2 g/dL (ref 6.5–8.1)

## 2022-04-21 LAB — CBC WITH DIFFERENTIAL/PLATELET
Abs Immature Granulocytes: 0.02 10*3/uL (ref 0.00–0.07)
Basophils Absolute: 0 10*3/uL (ref 0.0–0.1)
Basophils Relative: 0 %
Eosinophils Absolute: 0.1 10*3/uL (ref 0.0–0.5)
Eosinophils Relative: 2 %
HCT: 40.7 % (ref 36.0–46.0)
Hemoglobin: 13.4 g/dL (ref 12.0–15.0)
Immature Granulocytes: 0 %
Lymphocytes Relative: 38 %
Lymphs Abs: 2.1 10*3/uL (ref 0.7–4.0)
MCH: 30.7 pg (ref 26.0–34.0)
MCHC: 32.9 g/dL (ref 30.0–36.0)
MCV: 93.3 fL (ref 80.0–100.0)
Monocytes Absolute: 0.5 10*3/uL (ref 0.1–1.0)
Monocytes Relative: 9 %
Neutro Abs: 2.7 10*3/uL (ref 1.7–7.7)
Neutrophils Relative %: 51 %
Platelets: 253 10*3/uL (ref 150–400)
RBC: 4.36 MIL/uL (ref 3.87–5.11)
RDW: 14.6 % (ref 11.5–15.5)
WBC: 5.4 10*3/uL (ref 4.0–10.5)
nRBC: 0 % (ref 0.0–0.2)

## 2022-04-21 LAB — TROPONIN I (HIGH SENSITIVITY)
Troponin I (High Sensitivity): 3 ng/L (ref <18)
Troponin I (High Sensitivity): 4 ng/L (ref <18)

## 2022-04-21 LAB — BRAIN NATRIURETIC PEPTIDE: B Natriuretic Peptide: 5.4 pg/mL (ref 0.0–100.0)

## 2022-04-21 NOTE — Discharge Instructions (Addendum)
You were seen in the emergency department today for chest pain.  As we discussed, your lab work was reassuring today.  I do not think that your chest pain is related to your heart or your lungs, but I do think it could be related to your musculoskeletal system.  I recommend taking over-the-counter anti-inflammatory medicines such as Tylenol.  If your shoulder continues to bother you, I recommend following up with the orthopedist.   Continue to monitor how you're doing and return to the ER for new or worsening symptoms.

## 2022-04-21 NOTE — ED Provider Triage Note (Signed)
Emergency Medicine Provider Triage Evaluation Note  Kristi Campos , a 72 y.o. female  was evaluated in triage.  Pt complains of chest pain on right side of chest   Review of Systems  Positive: Pain in chest. Pain improved with nitro  Negative: Shortness of breath   Physical Exam  BP 139/76 (BP Location: Right Arm)   Pulse 75   Temp 98.2 F (36.8 C)   Resp 17   SpO2 98%  Gen:   Awake, no distress   Resp:  Normal effort  MSK:   Moves extremities without difficulty  Other:    Medical Decision Making  Medically screening exam initiated at 1:19 PM.  Appropriate orders placed.  Kristi Campos was informed that the remainder of the evaluation will be completed by another provider, this initial triage assessment does not replace that evaluation, and the importance of remaining in the ED until their evaluation is complete.     Fransico Meadow, [place] 04/21/22 1323

## 2022-04-21 NOTE — ED Provider Notes (Signed)
[facility] Provider Note   CSN: [id number] Arrival date & time: 04/21/22  1255     History  Chief Complaint  Patient presents with   Chest Pain    Kristi Campos is a 72 y.o. female with history of HTN, diabetes, uterine fibroids, GERD who presents to the ER complaining of chest pain starting this morning after waking. Localizes it to the right side of the chest and worse with movement. Had 1 nitroglycerin and 324 mg ASA with EMS and does not think this improved her symptoms much.  She points to her right upper chest, close to her right shoulder.  Describes having some issues with her right shoulder in the past and a previous doctor and told her it was coming from her rotator cuff.  Denies any known injury or anything that could have triggered her symptoms this morning.  She said it came on randomly, not associated with exertion.  She has been able to walk with her [place] and take full deep breaths without any worsening of pain.  Describes on and off numbness to the right side of her body, which she attributes to her neurological disorder that she follows with neurology for.  Says this has been going on "a long time".   Chest Pain Associated symptoms: no abdominal pain, no cough, no fever, no nausea, no palpitations, no shortness of breath and no vomiting        Home Medications Prior to Admission medications   Medication Sig Start Date End Date Taking? Authorizing Provider  aspirin EC 81 MG tablet Take 81 mg by mouth daily.    [provider]  atorvastatin (LIPITOR) 10 MG tablet Take 1 tablet (10 mg total) by mouth daily. 12/06/19   Argentina Donovan, PA-C  Blood Glucose Monitoring Suppl (ACCU-CHEK GUIDE) w/Device KIT Use to check blood sugar TID. Dx E11.69 10/30/21   Ladell Pier, MD  carbidopa-levodopa (SINEMET IR) 25-100 MG tablet Take 1 tablet by mouth 3 (three) times daily. 7am/11am/4pm 12/08/21   Tat, Eustace Quail, DO   Continuous Blood Gluc Receiver (FREESTYLE LIBRE READER) DEVI UAD 10/01/21   Ladell Pier, MD  Continuous Blood Gluc Sensor (FREESTYLE LIBRE SENSOR SYSTEM) MISC Change sensor Q 2 wks 10/01/21   Ladell Pier, MD  docusate sodium (COLACE) 100 MG capsule Take 1 capsule (100 mg total) by mouth every 12 (twelve) hours. 10/14/21   Montine Circle, PA-C  glucose blood (ACCU-CHEK GUIDE) test strip Use to check blood sugar TID. Dx E11.69 11/09/21   Ladell Pier, MD  insulin glargine (LANTUS SOLOSTAR) 100 UNIT/ML Solostar Pen Inject 15 Units into the skin daily. 10/22/21   Ladell Pier, MD  insulin lispro (HUMALOG KWIKPEN) 100 UNIT/ML KwikPen Give with meals. If BS 200-250 give 2 units Subcut; BS 251-300 4 units, BS 301-350 6 units, 351-400 8 units, 401-450 10 units, >450 give 13 units. 11/23/21   Ladell Pier, MD  Insulin Pen Needle (PEN NEEDLES) 31G X 8 MM MISC UAD 10/01/21   Ladell Pier, MD  JARDIANCE 25 MG TABS tablet TAKE 1 TABLET(25 MG) BY MOUTH DAILY 03/05/22   Ladell Pier, MD  losartan-hydrochlorothiazide [facility]) 100-25 MG tablet Take 1/2 tablet PO daily 11/25/21   Ladell Pier, MD  nitroGLYCERIN (NITROSTAT) 0.4 MG SL tablet Place 1 tablet (0.4 mg total) under the tongue every 5 (five) minutes as needed for chest pain. Patient not taking: Reported on 12/08/2021 10/18/20  Bonnielee Haff, MD  pantoprazole (PROTONIX) 40 MG tablet TAKE 1 TABLET BY MOUTH DAILY AT NOON 01/19/22   Ladell Pier, MD  predniSONE (DELTASONE) 20 MG tablet 30 tabs PO daily Patient not taking: Reported on 12/08/2021 11/23/21   Ladell Pier, MD  Semaglutide,0.25 or 0.5MG/DOS, (OZEMPIC, 0.25 OR 0.5 MG/DOSE,) 2 MG/3ML SOPN Inject 0.5 mg into the skin once a week. 02/19/22   Ladell Pier, MD  traMADol (ULTRAM) 50 MG tablet Take 1 tablet (50 mg total) by mouth every 6 (six) hours as needed. 11/26/21 11/26/22  Ulyses Amor, PA-C  simvastatin (ZOCOR) 10 MG tablet Take 10 mg by  mouth at bedtime. 01/01/19 02/28/20  [provider]      Allergies    Metformin and related    Review of Systems   Review of Systems  Constitutional:  Negative for chills and fever.  Respiratory:  Negative for cough and shortness of breath.   Cardiovascular:  Positive for chest pain. Negative for palpitations and leg swelling.  Gastrointestinal:  Negative for abdominal pain, nausea and vomiting.  Musculoskeletal:  Positive for arthralgias.  All other systems reviewed and are negative.   Physical Exam Updated Vital Signs BP 131/73   Pulse 78   Temp 98.2 F (36.8 C)   Resp 18   SpO2 100%  Physical Exam Vitals and nursing note reviewed.  Constitutional:      Appearance: Normal appearance.  HENT:     Head: Normocephalic and atraumatic.  Eyes:     Conjunctiva/sclera: Conjunctivae normal.  Cardiovascular:     Rate and Rhythm: Normal rate and regular rhythm.  Pulmonary:     Effort: Pulmonary effort is normal. No respiratory distress.     Breath sounds: Normal breath sounds.  Chest:     Comments: Some reproducible tenderness to palpation of the right lateral chest wall, just medial to the right shoulder. No bony tenderness or deformity.  Abdominal:     General: There is no distension.     Palpations: Abdomen is soft.     Tenderness: There is no abdominal tenderness.  Musculoskeletal:     Right lower leg: No edema.     Left lower leg: No edema.     Comments: Pain with ranging the right shoulder  Skin:    General: Skin is warm and dry.  Neurological:     General: No focal deficit present.     Mental Status: She is alert.     Comments: Intermittent stuttering speech, pt reports this is normal for her. Sensation intact in all extremities.      ED Results / Procedures / Treatments   Labs (all labs ordered are listed, but only abnormal results are displayed) Labs Reviewed  COMPREHENSIVE METABOLIC PANEL - Abnormal; Notable for the following components:       Result Value   Glucose, Bld 137 (*)    Creatinine, Ser 1.17 (*)    Albumin 3.4 (*)    GFR, Estimated 50 (*)    All other components within normal limits  CBC WITH DIFFERENTIAL/PLATELET  BRAIN NATRIURETIC PEPTIDE  TROPONIN I (HIGH SENSITIVITY)  TROPONIN I (HIGH SENSITIVITY)    EKG None  Radiology DG Chest 1 View  Result Date: 04/21/2022 CLINICAL DATA:  Mid chest pain for a few days, hypertension, GERD, diabetes mellitus EXAM: CHEST  1 VIEW COMPARISON:  Portable exam 1357 hours compared to 10/17/2020 FINDINGS: Normal heart size, mediastinal contours, and pulmonary vascularity. Lungs clear. No infiltrate, pleural effusion,  or pneumothorax. Osseous structures unremarkable. IMPRESSION: No acute abnormalities. Electronically Signed   By: Lavonia Dana M.D.   On: 04/21/2022 14:08    Procedures Procedures    Medications Ordered in ED Medications - No data to display  ED Course/ Medical Decision Making/ A&P             HEART Score: 4                Medical Decision Making This patient is a 72 y.o. female  who presents to the ED for concern of chest pain.   Differential diagnoses prior to evaluation: The emergent differential diagnosis includes, but is not limited to,  ACS, pericarditis, myocarditis, aortic dissection, PE, pneumothorax, esophageal spasm or rupture, chronic angina, pneumonia, bronchitis, GERD, reflux/PUD, biliary disease, pancreatitis, costochondritis, anxiety. This is not an exhaustive differential.   Past Medical History / Co-morbidities: HTN, diabetes, uterine fibroids, GERD  Additional history: Chart reviewed. Pertinent results include: Pt has previously followed with [facility] for for PAD/claudication and typical chest pain. Coronary calcium score in August 2021 was 0, echocardiogram normal in August 2022.   Physical Exam: Physical exam performed. The pertinent findings include: Normal vital signs, no acute distress.  No increased work of breathing,  normal oxygen saturations on room air, clear lung sounds to auscultation.  Some reproducible tenderness to palpation of the right lateral chest wall, without bony deformities of the chest wall or the shoulder.  Pain with ranging the right shoulder.  Neurovascularly intact in all extremities.  No peripheral edema.  Lab Tests/Imaging studies: I personally interpreted labs/imaging and the pertinent results include: No leukocytosis, normal hemoglobin.  CMP grossly at baseline.  BNP 5.4.  Initial troponin 3, delta troponin 4.  Chest x-ray without acute abnormalities.  I agree with the radiologist interpretation.  Cardiac monitoring: EKG obtained and interpreted by my attending physician which shows: Normal sinus rhythm   Disposition: After consideration of the diagnostic results and the patients response to treatment, I feel that emergency department workup does not suggest an emergent condition requiring admission or immediate intervention beyond what has been performed at this time. Patient is to be discharged with recommendation to follow up with PCP in regards to today's hospital visit. Chest pain is not likely of cardiac or pulmonary etiology d/t presentation, Well's criteria for PE low risk, VSS, no tracheal deviation, no JVD or new murmur, RRR, breath sounds equal bilaterally, EKG without acute abnormalities, negative troponins, and negative CXR. Heart score of 4. Pt has been advised to return to the ED if CP becomes exertional, associated with diaphoresis or nausea, radiates to left jaw/arm, worsens or becomes concerning in any way. Pt appears reliable for follow up and is agreeable to discharge.   Final Clinical Impression(s) / ED Diagnoses Final diagnoses:  None    Rx / DC Orders ED Discharge Orders     None      Portions of this report may have been transcribed using voice recognition software. Every effort was made to ensure accuracy; however, inadvertent computerized transcription  errors may be present.    Estill Cotta 04/21/22 1722    Regan Lemming, MD 04/21/22 1946

## 2022-04-21 NOTE — ED Triage Notes (Signed)
Pt here from home with c/o chest pain worse with movement , no n/v or sob , pt had 1 ntro and 34m asa  with ems

## 2022-04-22 ENCOUNTER — Other Ambulatory Visit: Payer: Self-pay | Admitting: Internal Medicine

## 2022-04-22 DIAGNOSIS — K219 Gastro-esophageal reflux disease without esophagitis: Secondary | ICD-10-CM

## 2022-04-22 NOTE — Telephone Encounter (Signed)
Requested Prescriptions  Pending Prescriptions Disp Refills   pantoprazole (PROTONIX) 40 MG tablet [Pharmacy Med Name: PANTOPRAZOLE 40MG TABLETS] 90 tablet 2    Sig: TAKE 1 TABLET BY MOUTH DAILY AT [place]     Gastroenterology: Proton Pump Inhibitors Passed - 04/22/2022 10:19 AM      Passed - Valid encounter within last 12 months    Recent Outpatient Visits           5 months ago Jaw claudication   [place], MD   5 months ago Diabetes mellitus type 2 in obese [facility])   [place], Jarome Matin, RPH-CPP   6 months ago Fatigue, unspecified type   [place], MD   6 months ago Recurrent falls   [place], MD   9 months ago Diabetes mellitus type 2 in obese [facility])   [place], MD       Future Appointments             In 6 days Gwenlyn Found, Pearletha Forge, MD [place] [facility]   In 2 months Wynetta Emery, Dalbert Batman, MD [place]

## 2022-04-27 ENCOUNTER — Telehealth: Payer: Self-pay | Admitting: Anesthesiology

## 2022-04-27 NOTE — Telephone Encounter (Signed)
Pt's daughter [name] called stating pt's speech and vision are getting worse, states she would like to get an appointment sooner instead of waiting until April.

## 2022-04-28 ENCOUNTER — Encounter: Payer: Self-pay | Admitting: Cardiovascular Disease

## 2022-04-28 ENCOUNTER — Ambulatory Visit: Payer: 59 | Attending: Cardiovascular Disease | Admitting: Cardiovascular Disease

## 2022-04-28 VITALS — BP 118/82 | HR 97 | Ht 63.0 in | Wt 189.2 lb

## 2022-04-28 DIAGNOSIS — R079 Chest pain, unspecified: Secondary | ICD-10-CM

## 2022-04-28 DIAGNOSIS — E782 Mixed hyperlipidemia: Secondary | ICD-10-CM | POA: Diagnosis not present

## 2022-04-28 DIAGNOSIS — I739 Peripheral vascular disease, unspecified: Secondary | ICD-10-CM | POA: Diagnosis not present

## 2022-04-28 DIAGNOSIS — I10 Essential (primary) hypertension: Secondary | ICD-10-CM | POA: Diagnosis not present

## 2022-04-28 NOTE — Assessment & Plan Note (Signed)
History of atypical right-sided chest pain in the past with a coronary calcium score performed 10/10/2019 which was 0.  Her 2D echocardiogram performed 10/18/2020 was normal as well.

## 2022-04-28 NOTE — Telephone Encounter (Signed)
Called patients daughter and asked more questions regarding her condition since her last appointment in October patient is really struggling with speech and swallowing.This was your last note concerning this at the last appointment  Discussed MBE, but she really is asymptomatic right now so we decided to hold off on that.

## 2022-04-28 NOTE — Progress Notes (Signed)
04/28/2022 Kristi Campos   12-23-1950  [id number]  Primary Physician Ladell Pier, MD Primary Cardiologist: Lorretta Harp MD Lupe Carney, [place]  HPI:  Kristi Campos is a 72 y.o.  moderately overweight divorced African-American female mother of 6 children, grandmother of 51 grandchildren referred by Dr. Posey Pronto for evaluation of PAD.  She is accompanied by one of her daughters [name] today.  I last saw her in the office 04/22/2021.  Her risk factors include treated hypertension, diabetes and hyperlipidemia.  There is no family history for heart disease.  She never had heart attack or stroke.  She denies shortness of breath but does get occasional atypical chest pain.  She has had lower extremity discomfort on the right side for several years which sounds somewhat atypical for claudication.  She also complains of lower extremity edema.  I obtained lower extremity Doppler studies on her 11/12/2019 which were entirely normal.  She was admitted to the hospital in August 2022 with chest pain with a negative work-up.  Her 2D echo was normal as was a coronary calcium score.  She said no recurrent symptoms.  She has since been diagnosed with a neurologic disorder similar to Parkinson's disease and is on carbidopa.  She was recently seen in the ER for right-sided chest pain which sounded musculoskeletal to me.  Her workup was unrevealing.   Current Meds  Medication Sig   aspirin EC 81 MG tablet Take 81 mg by mouth daily.   atorvastatin (LIPITOR) 10 MG tablet Take 1 tablet (10 mg total) by mouth daily.   Blood Glucose Monitoring Suppl (ACCU-CHEK GUIDE) w/Device KIT Use to check blood sugar TID. Dx E11.69   carbidopa-levodopa (SINEMET IR) 25-100 MG tablet Take 1 tablet by mouth 3 (three) times daily. 7am/11am/4pm   Continuous Blood Gluc Receiver (FREESTYLE LIBRE READER) DEVI UAD   Continuous Blood Gluc Sensor (FREESTYLE LIBRE SENSOR SYSTEM) MISC Change sensor Q 2 wks   docusate sodium  (COLACE) 100 MG capsule Take 1 capsule (100 mg total) by mouth every 12 (twelve) hours.   glucose blood (ACCU-CHEK GUIDE) test strip Use to check blood sugar TID. Dx E11.69   insulin glargine (LANTUS SOLOSTAR) 100 UNIT/ML Solostar Pen Inject 15 Units into the skin daily.   insulin lispro (HUMALOG KWIKPEN) 100 UNIT/ML KwikPen Give with meals. If BS 200-250 give 2 units Subcut; BS 251-300 4 units, BS 301-350 6 units, 351-400 8 units, 401-450 10 units, >450 give 13 units.   Insulin Pen Needle (PEN NEEDLES) 31G X 8 MM MISC UAD   JARDIANCE 25 MG TABS tablet TAKE 1 TABLET(25 MG) BY MOUTH DAILY   losartan-hydrochlorothiazide (HYZAAR) 100-25 MG tablet Take 1/2 tablet PO daily   nitroGLYCERIN (NITROSTAT) 0.4 MG SL tablet Place 1 tablet (0.4 mg total) under the tongue every 5 (five) minutes as needed for chest pain.   pantoprazole (PROTONIX) 40 MG tablet TAKE 1 TABLET BY MOUTH DAILY AT NOON   predniSONE (DELTASONE) 20 MG tablet 30 tabs PO daily   Semaglutide,0.25 or 0.'5MG'$ /DOS, (OZEMPIC, 0.25 OR 0.5 MG/DOSE,) 2 MG/3ML SOPN Inject 0.5 mg into the skin once a week.   traMADol (ULTRAM) 50 MG tablet Take 1 tablet (50 mg total) by mouth every 6 (six) hours as needed.     Allergies  Allergen Reactions   Metformin And Related Diarrhea    Social History   Socioeconomic History   Marital status: Widowed    Spouse name: Not on file   Number of  children: 4   Years of education: Not on file   Highest education level: Not on file  Occupational History    Employer: other   Occupation: retired    Comment: we[employer] for living  Tobacco Use   Smoking status: Never   Smokeless tobacco: Never  Vaping Use   Vaping Use: Never used  Substance and Sexual Activity   Alcohol use: No    Alcohol/week: 0.0 standard drinks of alcohol   Drug use: No   Sexual activity: Not Currently    Partners: Male    Birth control/protection: Post-menopausal  Other Topics Concern   Not on file  Social History Narrative   Right  handed    Lives at home alone ONE LEVEL   Caffeine1 cup daily   Social Determinants of Health   Financial Resource Strain: Not on file  Food Insecurity: Not on file  Transportation Needs: Not on file  Physical Activity: Not on file  Stress: Not on file  Social Connections: Not on file  Intimate Partner Violence: Not on file     Review of Systems: General: negative for chills, fever, night sweats or weight changes.  Cardiovascular: negative for chest pain, dyspnea on exertion, edema, orthopnea, palpitations, paroxysmal nocturnal dyspnea or shortness of breath Dermatological: negative for rash Respiratory: negative for cough or wheezing Urologic: negative for hematuria Abdominal: negative for nausea, vomiting, diarrhea, bright red blood per rectum, melena, or hematemesis Neurologic: negative for visual changes, syncope, or dizziness All other systems reviewed and are otherwise negative except as noted above.    Blood pressure 118/82, pulse 97, height '5\' 3"'$  (1.6 m), weight 189 lb 3.2 oz (85.8 kg), SpO2 94 %.  General appearance: alert and no distress Neck: no adenopathy, no carotid bruit, no JVD, supple, symmetrical, trachea midline, and thyroid not enlarged, symmetric, no tenderness/mass/nodules Lungs: clear to auscultation bilaterally Heart: regular rate and rhythm, S1, S2 normal, no murmur, click, rub or gallop Extremities: extremities normal, atraumatic, no cyanosis or edema Pulses: 2+ and symmetric Skin: Skin color, texture, turgor normal. No rashes or lesions Neurologic: Grossly normal  EKG not performed today  ASSESSMENT AND PLAN:   Peripheral arterial disease (HCC) History of lower extremity discomfort with Dopplers that showed normal ABIs.  I suspect this is nonvascular.  Essential hypertension History of essential hypertension blood pressure measured today at 118/82.  She is on losartan and hydrochlorothiazide.  Hyperlipidemia History of hyperlipidemia on statin  therapy with lipid profile performed 01/27/2021 revealing total cholesterol 154, LDL 74 and HDL 56.  Chest pain History of atypical right-sided chest pain in the past with a coronary calcium score performed 10/10/2019 which was 0.  Her 2D echocardiogram performed 10/18/2020 was normal as well.     Lorretta Harp MD FACP,FACC,FAHA, [facility] 04/28/2022 9:18 AM

## 2022-04-28 NOTE — Assessment & Plan Note (Signed)
History of hyperlipidemia on statin therapy with lipid profile performed 01/27/2021 revealing total cholesterol 154, LDL 74 and HDL 56.

## 2022-04-28 NOTE — Telephone Encounter (Signed)
Called patients daughter and she has agreed to help her mom get on the virtual call for Friday at 3

## 2022-04-28 NOTE — Patient Instructions (Signed)
Medication Instructions:  Your physician recommends that you continue on your current medications as directed. Please refer to the Current Medication list given to you today.  *If you need a refill on your cardiac medications before your next appointment, please call your pharmacy*   Follow-Up: At [facility], you and your health needs are our priority.  As part of our continuing mission to provide you with exceptional heart care, we have created designated Provider Care Teams.  These Care Teams include your primary Cardiologist (physician) and Advanced Practice Providers (APPs -  Physician Assistants and Nurse Practitioners) who all work together to provide you with the care you need, when you need it.  We recommend signing up for the patient portal called "MyChart".  Sign up information is provided on this After Visit Summary.  MyChart is used to connect with patients for Virtual Visits (Telemedicine).  Patients are able to view lab/test results, encounter notes, upcoming appointments, etc.  Non-urgent messages can be sent to your provider as well.   To learn more about what you can do with MyChart, go to [email or web address].    Your next appointment:   We will see you on an as needed basis.  Provider:   Quay Burow, MD

## 2022-04-28 NOTE — Progress Notes (Signed)
Virtual Visit Via Video       Consent was obtained for video visit:  Yes.   Answered questions that patient had about telehealth interaction:  Yes.   I discussed the limitations, risks, security and privacy concerns of performing an evaluation and management service by telemedicine. I also discussed with the patient that there may be a patient responsible charge related to this service. The patient expressed understanding and agreed to proceed.  Pt location: Home Physician Location: office Name of referring provider:  Ladell Pier, MD I connected with Kristi Campos at patients initiation/request on 04/30/2022 at  3:00 PM EST by video enabled telemedicine application and verified that I am speaking with the correct person using two identifiers. 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  Understands that there is an increased risk of falls, aspiration as subsequent morbidity and mortality from this disease compared to Parkinson's disease.  -Patient did not do well off of levodopa, so wants to continue carbidopa/levodopa 25/100, 1 tablet 3 times per day  -Patient does not drive.  She does live by herself, but daughter lives next door.  -I will reach out to adept to see what is holding up the wheelchair.  May just be insurance but want to make sure there is nothing that we can do.   2.  Eyelid opening apraxia  -Not interested in Botox right now.  3.  Vision change/generalized fatigue jaw claudication  -TA biopsy negative  -Her vision issues really sound most consistent with PSP.  She has diplopia and bilateral blurry vision (which is likely a combination of cataracts as well as PSP issues).  She is having no unilateral vision change.   4.  Dizziness  -Improved after blood pressure medications decreased  5.   Dysphagia  -order mbe  -refer to ST for swallow therapy depending on MBE results.  Will discuss at f/u appt next month  Subjective:   Kristi Campos was seen today in follow-up.  When I saw her last visit, we did a wheelchair evaluation.  They report that they haven't gotten it yet due an insurance issue.  She has had some falls but hasn't gotten hurt.  She asked for an earlier visit because she was having more trouble swallowing.  That really was not an issue last visit.  She reports that its an issue in the AM.  Doesn't seem to be with any particular consistency of food, just more in the AM.   Current prescribed movement disorder medications: Carbidopa/levodopa 25/100, 1 tablet 3 times per day.    ALLERGIES:   Allergies  Allergen Reactions   Metformin And Related Diarrhea    CURRENT MEDICATIONS:  Outpatient Encounter Medications as of 04/30/2022  Medication Sig   aspirin EC 81 MG tablet Take 81 mg by mouth daily.   atorvastatin (LIPITOR) 10 MG tablet Take 1 tablet (10 mg total) by mouth daily.   Blood Glucose Monitoring Suppl (ACCU-CHEK GUIDE) w/Device KIT Use to check blood sugar TID. Dx E11.69   carbidopa-levodopa (SINEMET IR) 25-100 MG tablet Take 1 tablet by mouth 3 (three) times daily. 7am/11am/4pm   Continuous Blood Gluc Receiver (FREESTYLE LIBRE READER) DEVI UAD   Continuous Blood Gluc Sensor (FREESTYLE LIBRE SENSOR SYSTEM) MISC Change sensor Q 2 wks   docusate sodium (COLACE) 100 MG capsule Take 1 capsule (  100 mg total) by mouth every 12 (twelve) hours.   glucose blood (ACCU-CHEK GUIDE) test strip Use to check blood sugar TID. Dx E11.69   insulin glargine (LANTUS SOLOSTAR) 100 UNIT/ML Solostar Pen Inject 15 Units into the skin daily.   insulin lispro (HUMALOG KWIKPEN) 100 UNIT/ML KwikPen Give with meals. If BS 200-250 give 2 units Subcut; BS 251-300 4 units, BS 301-350 6 units, 351-400 8 units, 401-450 10 units, >450 give 13 units.   Insulin Pen Needle (PEN NEEDLES) 31G X 8  MM MISC UAD   JARDIANCE 25 MG TABS tablet TAKE 1 TABLET(25 MG) BY MOUTH DAILY   losartan-hydrochlorothiazide (HYZAAR) 100-25 MG tablet Take 1/2 tablet PO daily   nitroGLYCERIN (NITROSTAT) 0.4 MG SL tablet Place 1 tablet (0.4 mg total) under the tongue every 5 (five) minutes as needed for chest pain.   pantoprazole (PROTONIX) 40 MG tablet TAKE 1 TABLET BY MOUTH DAILY AT NOON   predniSONE (DELTASONE) 20 MG tablet 30 tabs PO daily   Semaglutide,0.25 or 0.'5MG'$ /DOS, (OZEMPIC, 0.25 OR 0.5 MG/DOSE,) 2 MG/3ML SOPN Inject 0.5 mg into the skin once a week.   traMADol (ULTRAM) 50 MG tablet Take 1 tablet (50 mg total) by mouth every 6 (six) hours as needed.   [DISCONTINUED] simvastatin (ZOCOR) 10 MG tablet Take 10 mg by mouth at bedtime.   No facility-administered encounter medications on file as of 04/30/2022.    Objective:   PHYSICAL EXAMINATION:    VITALS:   There were no vitals filed for this visit.  GEN:  The patient appears stated age and is in NAD.   Neurological examination:  Orientation: The patient is alert and oriented x3.  Cranial nerves: There is good facial symmetry. There is facial hypomimia.   The speech is fluent and dysarthric. some stuttering (same as previous).  She has trouble with the gutteral sounds.     I have reviewed and interpreted the following labs independently    Chemistry      Component Value Date/Time   NA 138 04/21/2022 1329   NA 144 11/23/2021 1118   K 3.9 04/21/2022 1329   CL 104 04/21/2022 1329   CO2 25 04/21/2022 1329   BUN 17 04/21/2022 1329   BUN 17 11/23/2021 1118   CREATININE 1.17 (H) 04/21/2022 1329      Component Value Date/Time   CALCIUM 9.1 04/21/2022 1329   ALKPHOS 61 04/21/2022 1329   AST 17 04/21/2022 1329   ALT <5 04/21/2022 1329   BILITOT 0.8 04/21/2022 1329   BILITOT 0.3 10/22/2021 1134       Lab Results  Component Value Date   WBC 5.4 04/21/2022   HGB 13.4 04/21/2022   HCT 40.7 04/21/2022   MCV 93.3 04/21/2022   PLT 253  04/21/2022    Lab Results  Component Value Date   TSH 0.984 07/23/2021   Lab Results  Component Value Date   ESRSEDRATE 32 11/23/2021   Lab Results  Component Value Date   CRP 7 11/23/2021      Follow up Instructions      -I discussed the assessment and treatment plan with the patient. The patient was provided an opportunity to ask questions and all were answered. The patient agreed with the plan and demonstrated an understanding of the instructions.   The patient was advised to call back or seek an in-person evaluation if the symptoms worsen or if the condition fails to improve as anticipated.    Alonza Bogus, DO  Cc:  Ladell Pier, MD

## 2022-04-28 NOTE — Assessment & Plan Note (Signed)
History of lower extremity discomfort with Dopplers that showed normal ABIs.  I suspect this is nonvascular.

## 2022-04-28 NOTE — Assessment & Plan Note (Signed)
History of essential hypertension blood pressure measured today at 118/82.  She is on losartan and hydrochlorothiazide.

## 2022-04-29 ENCOUNTER — Ambulatory Visit: Payer: 59 | Attending: Internal Medicine

## 2022-04-29 VITALS — Ht 63.0 in | Wt 189.0 lb

## 2022-04-29 DIAGNOSIS — Z0000 Encounter for general adult medical examination without abnormal findings: Secondary | ICD-10-CM

## 2022-04-29 NOTE — Progress Notes (Signed)
I connected with  Kristi Campos on 04/29/22 by a audio enabled telemedicine application and verified that I am speaking with the correct person using two identifiers.  Patient Location: Home  Provider Location: Office/Clinic  I discussed the limitations of evaluation and management by telemedicine. The patient expressed understanding and agreed to proceed.  Subjective:   Kristi Campos is a 72 y.o. female who presents for an Initial Medicare Annual Wellness Visit.  Review of Systems     Cardiac Risk Factors include: advanced age (>48mn, >>30women);diabetes mellitus;dyslipidemia;hypertension;obesity (BMI >30kg/m2)     Objective:    Today's Vitals   04/29/22 1216  Weight: 189 lb (85.7 kg)  Height: '5\' 3"'$  (1.6 m)   Body mass index is 33.48 kg/m.     04/29/2022   12:21 PM 12/08/2021    7:30 AM 11/26/2021    6:49 AM 10/23/2021    7:40 AM 03/31/2021    8:43 AM 01/08/2021    9:52 AM 10/18/2020   12:59 AM  Advanced Directives  Does Patient Have a Medical Advance Directive? Yes Yes No Yes Yes No No  Type of Advance Directive Healthcare Power of ABerthain Chart? No - copy requested    Yes - validated most recent copy scanned in chart (See row information)    Would patient like information on creating a medical advance directive?   No - Patient declined    No - Patient declined    Current Medications (verified) Outpatient Encounter Medications as of 04/29/2022  Medication Sig   aspirin EC 81 MG tablet Take 81 mg by mouth daily.   atorvastatin (LIPITOR) 10 MG tablet Take 1 tablet (10 mg total) by mouth daily.   Blood Glucose Monitoring Suppl (ACCU-CHEK GUIDE) w/Device KIT Use to check blood sugar TID. Dx E11.69   carbidopa-levodopa (SINEMET IR) 25-100 MG tablet Take 1 tablet by mouth 3 (three) times daily. 7am/11am/4pm   docusate sodium (COLACE) 100 MG capsule Take 1 capsule (100 mg  total) by mouth every 12 (twelve) hours.   glucose blood (ACCU-CHEK GUIDE) test strip Use to check blood sugar TID. Dx E11.69   insulin glargine (LANTUS SOLOSTAR) 100 UNIT/ML Solostar Pen Inject 15 Units into the skin daily.   insulin lispro (HUMALOG KWIKPEN) 100 UNIT/ML KwikPen Give with meals. If BS 200-250 give 2 units Subcut; BS 251-300 4 units, BS 301-350 6 units, 351-400 8 units, 401-450 10 units, >450 give 13 units.   Insulin Pen Needle (PEN NEEDLES) 31G X 8 MM MISC UAD   JARDIANCE 25 MG TABS tablet TAKE 1 TABLET(25 MG) BY MOUTH DAILY   losartan-hydrochlorothiazide (HYZAAR) 100-25 MG tablet Take 1/2 tablet PO daily   nitroGLYCERIN (NITROSTAT) 0.4 MG SL tablet Place 1 tablet (0.4 mg total) under the tongue every 5 (five) minutes as needed for chest pain.   pantoprazole (PROTONIX) 40 MG tablet TAKE 1 TABLET BY MOUTH DAILY AT NOON   predniSONE (DELTASONE) 20 MG tablet 30 tabs PO daily   Semaglutide,0.25 or 0.'5MG'$ /DOS, (OZEMPIC, 0.25 OR 0.5 MG/DOSE,) 2 MG/3ML SOPN Inject 0.5 mg into the skin once a week.   traMADol (ULTRAM) 50 MG tablet Take 1 tablet (50 mg total) by mouth every 6 (six) hours as needed.   Continuous Blood Gluc Receiver (FREESTYLE LIBRE READER) DEVI UAD (Patient not taking: Reported on 04/29/2022)   Continuous Blood Gluc Sensor (FREESTYLE LIBRE SENSOR SYSTEM) MISC Change sensor  Q 2 wks (Patient not taking: Reported on 04/29/2022)   [DISCONTINUED] simvastatin (ZOCOR) 10 MG tablet Take 10 mg by mouth at bedtime.   No facility-administered encounter medications on file as of 04/29/2022.    Allergies (verified) Metformin and related   History: Past Medical History:  Diagnosis Date   Diabetes mellitus without complication ([place])    Fibroid uterus    GERD (gastroesophageal reflux disease)    Hypertension    Vaginal Pap smear, abnormal    Past Surgical History:  Procedure Laterality Date   ARTERY BIOPSY Left 11/26/2021   Procedure: BIOPSY TEMPORAL ARTERY;  Surgeon: Cherre Robins, MD;  Location: [facility];  Service: Vascular;  Laterality: Left;   COLONOSCOPY     I & D EXTREMITY Right 08/09/2013   Procedure: MINOR IRRIGATION AND DEBRIDEMENT EXTREMITY;  Surgeon: Cammie Sickle, MD;  Location: [facility];  Service: Orthopedics;  Laterality: Right;  long       wound class 4   Family History  Problem Relation Age of Onset   Bipolar disorder Mother    Hypertension Sister    Hypertension Sister    Hypertension Sister    Hypertension Sister    Hypertension Sister    Hypertension Sister    Hypertension Daughter    Diabetes Son    Prostate cancer Son    Social History   Socioeconomic History   Marital status: Widowed    Spouse name: Not on file   Number of children: 4   Years of education: Not on file   Highest education level: Not on file  Occupational History    Employer: other   Occupation: retired    Comment: we[employer] for living  Tobacco Use   Smoking status: Never   Smokeless tobacco: Never  Vaping Use   Vaping Use: Never used  Substance and Sexual Activity   Alcohol use: No    Alcohol/week: 0.0 standard drinks of alcohol   Drug use: No   Sexual activity: Not Currently    Partners: Male    Birth control/protection: Post-menopausal  Other Topics Concern   Not on file  Social History Narrative   Right handed    Lives at home alone [place] cup daily   Social Determinants of Health   Financial Resource Strain: Low Risk  (04/29/2022)   Overall Financial Resource Strain (CARDIA)    Difficulty of Paying Living Expenses: Not hard at all  Food Insecurity: No Food Insecurity (04/29/2022)   Hunger Vital Sign    Worried About Running Out of Food in the Last Year: Never true    [place] in the Last Year: Never true  Transportation Needs: No Transportation Needs (04/29/2022)   PRAPARE - Hydrologist (Medical): No    Lack of Transportation (Non-Medical): No  Physical Activity:  Inactive (04/29/2022)   Exercise Vital Sign    Days of Exercise per Week: 0 days    Minutes of Exercise per Session: 0 min  Stress: No Stress Concern Present (04/29/2022)   [place]    Feeling of Stress : Not at all  Social Connections: Not on file    Tobacco Counseling Counseling given: Not Answered   Clinical Intake:  Pre-visit preparation completed: Yes  Pain : No/denies pain     Nutritional Status: BMI > 30  Obese Nutritional Risks: None Diabetes: Yes  How often do  you need to have someone help you when you read instructions, pamphlets, or other written materials from your doctor or pharmacy?: 1 - Never  Diabetic? Yes Nutrition Risk Assessment:  Has the patient had any N/V/D within the last 2 months?  No  Does the patient have any non-healing wounds?  No  Has the patient had any unintentional weight loss or weight gain?  No   Diabetes:  Is the patient diabetic?  Yes  If diabetic, was a CBG obtained today?  No  Did the patient bring in their glucometer from home?  No  How often do you monitor your CBG's? daily.   Financial Strains and Diabetes Management:  Are you having any financial strains with the device, your supplies or your medication? No .  Does the patient want to be seen by Chronic Care Management for management of their diabetes?  No  Would the patient like to be referred to a Nutritionist or for Diabetic Management?  No   Diabetic Exams:  Diabetic Eye Exam: Completed 12/03/2021 Diabetic Foot Exam: Completed 07/23/2021   Interpreter Needed?: No  Information entered by :: NAllen LPN   Activities of Daily Living    04/29/2022   12:22 PM 11/26/2021    6:53 AM  In your present state of health, do you have any difficulty performing the following activities:  Hearing? 0 0  Vision? 0 0  Difficulty concentrating or making decisions? 0 0  Walking or climbing stairs? 1 0  Dressing or  bathing? 0 0  Doing errands, shopping? 1   Comment daughter and grandson goes with her   Conservation officer, nature and eating ? N   Using the Toilet? N   In the past six months, have you accidently leaked urine? N   Do you have problems with loss of bowel control? N   Managing your Medications? N   Managing your Finances? N   Housekeeping or managing your Housekeeping? Y     Patient Care Team: Ladell Pier, MD as PCP - General (Internal Medicine) Lorretta Harp, MD as PCP - Cardiology (Cardiology) Suella Broad, MD as Consulting Physician (Physical Medicine and Rehabilitation) Tat, Eustace Quail, DO as Consulting Physician (Neurology)  Indicate any recent Medical Services you may have received from other than [facility] providers in the past year (date may be approximate).     Assessment:   This is a routine wellness examination for Kristi Campos.  Hearing/Vision screen Vision Screening - Comments:: Regular eye exams,   Dietary issues and exercise activities discussed: Current Exercise Habits: The patient does not participate in regular exercise at present   Goals Addressed             This Visit's Progress    Patient Stated       04/29/2022, no goals       Depression Screen    04/29/2022   12:22 PM 12/21/2021    9:57 AM 10/01/2021    9:51 AM 03/31/2021    8:42 AM 01/27/2021   10:21 AM 12/06/2019   10:04 AM 06/28/2019    9:02 AM  PHQ 2/9 Scores  PHQ - 2 Score 0 0 4 2 0 0 0  PHQ- 9 Score  0 5 4 0 5 0    Fall Risk    04/29/2022   12:22 PM 12/08/2021    7:30 AM 11/23/2021   10:06 AM 10/23/2021    7:40 AM 10/01/2021    9:40 AM  [place]  in the past year? 0 1 0 1 1  Number falls in past yr: 0 0 0 1 0  Injury with Fall? 0 0 0 0 1  Risk for fall due to : Impaired mobility;Impaired balance/gait;Medication side effect  No Fall Risks    Follow up Falls prevention discussed;Education provided;Falls evaluation completed Falls evaluation completed       FALL RISK PREVENTION  PERTAINING TO THE HOME:  Any stairs in or around the home? No  If so, are there any without handrails? N/a Home free of loose throw rugs in walkways, pet beds, electrical cords, etc? Yes  Adequate lighting in your home to reduce risk of falls? Yes   ASSISTIVE DEVICES UTILIZED TO PREVENT FALLS:  Life alert? No  Use of a cane, [place] or w/c? Yes  Grab bars in the bathroom? Yes  Shower chair or bench in shower? Yes  Elevated toilet seat or a handicapped toilet? No   TIMED UP AND GO:  Was the test performed? No .      Cognitive Function:    03/31/2021    8:45 AM  MMSE - Mini Mental State Exam  Orientation to time 5  Orientation to Place 5  Registration 3  Attention/ Calculation 5  Recall 3  Language- name 2 objects 2  Language- repeat 1  Language- follow 3 step command 3  Language- read & follow direction 1  Write a sentence 0  Copy design 0  Total score 28        04/29/2022   12:24 PM  6CIT Screen  What Year? 0 points  What month? 0 points  What time? 0 points  Count back from 20 0 points  Months in reverse 0 points  Repeat phrase 8 points  Total Score 8 points    Immunizations Immunization History  Administered Date(s) Administered   Fluad Quad(high Dose 65+) 11/24/2018, 11/23/2021   Influenza,inj,Quad PF,6+ Mos 11/26/2019   Influenza,inj,quad, With Preservative 03/15/2018   Influenza-Unspecified 01/13/2021   PFIZER(Purple Top)SARS-COV-2 Vaccination 04/01/2020, 04/22/2020   Pneumococcal Conjugate (Pcv15) 03/31/2021   Pneumococcal Polysaccharide-23 10/24/2018   Td 11/24/2018   Zoster Recombinat (Shingrix) 10/09/2018, 12/25/2018    TDAP status: Up to date  Flu Vaccine status: Up to date  Pneumococcal vaccine status: Up to date  Covid-19 vaccine status: Completed vaccines  Qualifies for Shingles Vaccine? Yes   Zostavax completed Yes   Shingrix Completed?: Yes  Screening Tests Health Maintenance  Topic Date Due   Hepatitis C Screening  Never  done   COVID-19 Vaccine (3 - Pfizer risk series) 05/20/2020   Diabetic kidney evaluation - Urine ACR  01/27/2022   Medicare Annual Wellness (AWV)  03/31/2022   HEMOGLOBIN A1C  04/03/2022   FOOT EXAM  07/24/2022   OPHTHALMOLOGY EXAM  12/04/2022   Diabetic kidney evaluation - eGFR measurement  04/22/2023   MAMMOGRAM  03/02/2024   DTaP/Tdap/Td (2 - Tdap) 11/23/2028   COLONOSCOPY (Pts 45-75yr Insurance coverage will need to be confirmed)  06/17/2029   Pneumonia Vaccine 72 Years old  Completed   INFLUENZA VACCINE  Completed   DEXA SCAN  Completed   Zoster Vaccines- Shingrix  Completed   HPV VACCINES  Aged Out    Health Maintenance  Health Maintenance Due  Topic Date Due   Hepatitis C Screening  Never done   COVID-19 Vaccine (3 - [place] series) 05/20/2020   Diabetic kidney evaluation - Urine ACR  01/27/2022   Medicare Annual Wellness (AWV)  03/31/2022  HEMOGLOBIN A1C  04/03/2022    Colorectal cancer screening: Type of screening: Colonoscopy. Completed 06/18/2019. Repeat every 10 years  Mammogram status: Completed 03/02/2022. Repeat every year  Bone Density status: Completed 08/24/2021.   Lung Cancer Screening: (Low Dose CT Chest recommended if Age 66-80 years, 30 pack-year currently smoking OR have quit w/in 15years.) does not qualify.   Lung Cancer Screening Referral: no  Additional Screening:  Hepatitis C Screening: does qualify;   Vision Screening: Recommended annual ophthalmology exams for early detection of glaucoma and other disorders of the eye. Is the patient up to date with their annual eye exam?  Yes  Who is the provider or what is the name of the office in which the patient attends annual eye exams? Unable to state name If pt is not established with a provider, would they like to be referred to a provider to establish care? No .   Dental Screening: Recommended annual dental exams for proper oral hygiene  Community Resource Referral / Chronic Care  Management: CRR required this visit?  No   CCM required this visit?  No      Plan:     I have personally reviewed and noted the following in the patient's chart:   Medical and social history Use of alcohol, tobacco or illicit drugs  Current medications and supplements including opioid prescriptions. Patient is not currently taking opioid prescriptions. Functional ability and status Nutritional status Physical activity Advanced directives List of other physicians Hospitalizations, surgeries, and ER visits in previous 12 months Vitals Screenings to include cognitive, depression, and falls Referrals and appointments  In addition, I have reviewed and discussed with patient certain preventive protocols, quality metrics, and best practice recommendations. A written personalized care plan for preventive services as well as general preventive health recommendations were provided to patient.     Kellie Simmering, LPN   [id number]   Nurse Notes: none  Due to this being a virtual visit, the after visit summary with patients personalized plan was offered to patient via mail or my-chart. Patient would like to access on my-chart

## 2022-04-29 NOTE — Patient Instructions (Signed)
Kristi Campos , Thank you for taking time to come for your Medicare Wellness Visit. I appreciate your ongoing commitment to your health goals. Please review the following plan we discussed and let me know if I can assist you in the future.   These are the goals we discussed:  Goals      Patient Stated     04/29/2022, no goals        This is a list of the screening recommended for you and due dates:  Health Maintenance  Topic Date Due   Hepatitis C Screening: USPSTF Recommendation to screen - Ages 3-79 yo.  Never done   COVID-19 Vaccine (3 - Pfizer risk series) 05/20/2020   Yearly kidney health urinalysis for diabetes  01/27/2022   Hemoglobin A1C  04/03/2022   Complete foot exam   07/24/2022   Eye exam for diabetics  12/04/2022   Yearly kidney function blood test for diabetes  04/22/2023   Medicare Annual Wellness Visit  04/29/2023   Mammogram  03/02/2024   DTaP/Tdap/Td vaccine (2 - Tdap) 11/23/2028   Colon Cancer Screening  06/17/2029   Pneumonia Vaccine  Completed   Flu Shot  Completed   DEXA scan (bone density measurement)  Completed   Zoster (Shingles) Vaccine  Completed   HPV Vaccine  Aged Out    Advanced directives: Please bring a copy of your POA (Power of [place]) and/or Living Will to your next appointment.   Conditions/risks identified: none  Next appointment: Follow up in one year for your annual wellness visit    Preventive Care 65 Years and Older, Female Preventive care refers to lifestyle choices and visits with your health care provider that can promote health and wellness. What does preventive care include? A yearly physical exam. This is also called an annual well check. Dental exams once or twice a year. Routine eye exams. Ask your health care provider how often you should have your eyes checked. Personal lifestyle choices, including: Daily care of your teeth and gums. Regular physical activity. Eating a healthy diet. Avoiding tobacco and drug  use. Limiting alcohol use. Practicing safe sex. Taking low-dose aspirin every day. Taking vitamin and mineral supplements as recommended by your health care provider. What happens during an annual well check? The services and screenings done by your health care provider during your annual well check will depend on your age, overall health, lifestyle risk factors, and family history of disease. Counseling  Your health care provider may ask you questions about your: Alcohol use. Tobacco use. Drug use. Emotional well-being. Home and relationship well-being. Sexual activity. Eating habits. History of falls. Memory and ability to understand (cognition). Work and work Statistician. Reproductive health. Screening  You may have the following tests or measurements: Height, weight, and BMI. Blood pressure. Lipid and cholesterol levels. These may be checked every 5 years, or more frequently if you are over 90 years old. Skin check. Lung cancer screening. You may have this screening every year starting at age 68 if you have a 30-pack-year history of smoking and currently smoke or have quit within the past 15 years. Fecal occult blood test (FOBT) of the stool. You may have this test every year starting at age 75. Flexible sigmoidoscopy or colonoscopy. You may have a sigmoidoscopy every 5 years or a colonoscopy every 10 years starting at age 57. Hepatitis C blood test. Hepatitis B blood test. Sexually transmitted disease (STD) testing. Diabetes screening. This is done by checking your blood sugar (glucose) after  you have not eaten for a while (fasting). You may have this done every 1-3 years. Bone density scan. This is done to screen for osteoporosis. You may have this done starting at age 24. Mammogram. This may be done every 1-2 years. Talk to your health care provider about how often you should have regular mammograms. Talk with your health care provider about your test results, treatment  options, and if necessary, the need for more tests. Vaccines  Your health care provider may recommend certain vaccines, such as: Influenza vaccine. This is recommended every year. Tetanus, diphtheria, and acellular pertussis (Tdap, Td) vaccine. You may need a Td booster every 10 years. Zoster vaccine. You may need this after age 55. Pneumococcal 13-valent conjugate (PCV13) vaccine. One dose is recommended after age 50. Pneumococcal polysaccharide (PPSV23) vaccine. One dose is recommended after age 27. Talk to your health care provider about which screenings and vaccines you need and how often you need them. This information is not intended to replace advice given to you by your health care provider. Make sure you discuss any questions you have with your health care provider. Document Released: 03/14/2015 Document Revised: 11/05/2015 Document Reviewed: 12/17/2014 Elsevier Interactive Patient Education  2017 [place] Prevention in the Home Falls can cause injuries. They can happen to people of all ages. There are many things you can do to make your home safe and to help prevent falls. What can I do on the outside of my home? Regularly fix the edges of walkways and driveways and fix any cracks. Remove anything that might make you trip as you walk through a door, such as a raised step or threshold. Trim any bushes or trees on the path to your home. Use bright outdoor lighting. Clear any walking paths of anything that might make someone trip, such as rocks or tools. Regularly check to see if handrails are loose or broken. Make sure that both sides of any steps have handrails. Any raised decks and porches should have guardrails on the edges. Have any leaves, snow, or ice cleared regularly. Use sand or salt on walking paths during winter. Clean up any spills in your garage right away. This includes oil or grease spills. What can I do in the bathroom? Use night lights. Install grab  bars by the toilet and in the tub and shower. Do not use towel bars as grab bars. Use non-skid mats or decals in the tub or shower. If you need to sit down in the shower, use a plastic, non-slip stool. Keep the floor dry. Clean up any water that spills on the floor as soon as it happens. Remove soap buildup in the tub or shower regularly. Attach bath mats securely with double-sided non-slip rug tape. Do not have throw rugs and other things on the floor that can make you trip. What can I do in the bedroom? Use night lights. Make sure that you have a light by your bed that is easy to reach. Do not use any sheets or blankets that are too big for your bed. They should not hang down onto the floor. Have a firm chair that has side arms. You can use this for support while you get dressed. Do not have throw rugs and other things on the floor that can make you trip. What can I do in the kitchen? Clean up any spills right away. Avoid walking on wet floors. Keep items that you use a lot in easy-to-reach places. If you  need to reach something above you, use a strong step stool that has a grab bar. Keep electrical cords out of the way. Do not use floor polish or wax that makes floors slippery. If you must use wax, use non-skid floor wax. Do not have throw rugs and other things on the floor that can make you trip. What can I do with my stairs? Do not leave any items on the stairs. Make sure that there are handrails on both sides of the stairs and use them. Fix handrails that are broken or loose. Make sure that handrails are as long as the stairways. Check any carpeting to make sure that it is firmly attached to the stairs. Fix any carpet that is loose or worn. Avoid having throw rugs at the top or bottom of the stairs. If you do have throw rugs, attach them to the floor with carpet tape. Make sure that you have a light switch at the top of the stairs and the bottom of the stairs. If you do not have them,  ask someone to add them for you. What else can I do to help prevent falls? Wear shoes that: Do not have high heels. Have rubber bottoms. Are comfortable and fit you well. Are closed at the toe. Do not wear sandals. If you use a stepladder: Make sure that it is fully opened. Do not climb a closed stepladder. Make sure that both sides of the stepladder are locked into place. Ask someone to hold it for you, if possible. Clearly mark and make sure that you can see: Any grab bars or handrails. First and last steps. Where the edge of each step is. Use tools that help you move around (mobility aids) if they are needed. These include: Canes. Walkers. Scooters. Crutches. Turn on the lights when you go into a dark area. Replace any light bulbs as soon as they burn out. Set up your furniture so you have a clear path. Avoid moving your furniture around. If any of your floors are uneven, fix them. If there are any pets around you, be aware of where they are. Review your medicines with your doctor. Some medicines can make you feel dizzy. This can increase your chance of falling. Ask your doctor what other things that you can do to help prevent falls. This information is not intended to replace advice given to you by your health care provider. Make sure you discuss any questions you have with your health care provider. Document Released: 12/12/2008 Document Revised: 07/24/2015 Document Reviewed: 03/22/2014 Elsevier Interactive Patient Education  2017 [employer].

## 2022-04-30 ENCOUNTER — Telehealth (INDEPENDENT_AMBULATORY_CARE_PROVIDER_SITE_OTHER): Payer: 59 | Admitting: Neurology

## 2022-04-30 ENCOUNTER — Other Ambulatory Visit: Payer: Self-pay

## 2022-04-30 DIAGNOSIS — R1310 Dysphagia, unspecified: Secondary | ICD-10-CM

## 2022-04-30 DIAGNOSIS — G231 Progressive supranuclear ophthalmoplegia [Steele-Richardson-Olszewski]: Secondary | ICD-10-CM

## 2022-04-30 DIAGNOSIS — H539 Unspecified visual disturbance: Secondary | ICD-10-CM

## 2022-04-30 DIAGNOSIS — I739 Peripheral vascular disease, unspecified: Secondary | ICD-10-CM

## 2022-04-30 DIAGNOSIS — R1319 Other dysphagia: Secondary | ICD-10-CM

## 2022-05-03 ENCOUNTER — Other Ambulatory Visit (HOSPITAL_COMMUNITY): Payer: Self-pay | Admitting: *Deleted

## 2022-05-03 DIAGNOSIS — R1310 Dysphagia, unspecified: Secondary | ICD-10-CM

## 2022-05-04 ENCOUNTER — Telehealth: Payer: Self-pay

## 2022-05-04 NOTE — Telephone Encounter (Signed)
Kristi Campos called about the denial for a power wheelchair from insurance. Wanting to discuss next steps.

## 2022-05-05 NOTE — Telephone Encounter (Signed)
Called Debbie and left message about this patient

## 2022-05-06 ENCOUNTER — Ambulatory Visit: Payer: Self-pay

## 2022-05-06 NOTE — Telephone Encounter (Signed)
  Chief Complaint: Fatigue Symptoms: Fatigue and worsening eye sight Frequency: 2 months - getting worse Pertinent Negatives: Patient denies  Disposition: '[x]'$ ED /'[x]'$ Urgent Care (no appt availability in office) / '[]'$ Appointment(In office/virtual)/ '[]'$  [place] [place] [facility]/ '[]'$ Home Care/ '[]'$ Refused Recommended Disposition /'[]'$ [facility]/ '[]'$  Follow-up with PCP Additional Notes: Received call from pt's daughter Elwin Sleight, who states that her mother is becoming more fatigue each day. She states that her eye sight has become a lot worse as well. Pt has new diagnosis of PSP.  Sent [facility] teams message   - no response. Called  - no answer.  Advised daughter to take pt to UC or ED.  FC will call daughter.  Reason for Disposition  [1] MODERATE weakness (i.e., interferes with work, school, normal activities) AND [2] persists > 3 days  Answer Assessment - Initial Assessment Questions 1. DESCRIPTION: "Describe how you are feeling."     Fatigue  - cannot do a lot 2. SEVERITY: "How bad is it?"  "Can you stand and walk?"   - MILD (0-3): Feels weak or tired, but does not interfere with work, school or normal activities.   - MODERATE (4-7): Able to stand and walk; weakness interferes with work, school, or normal activities.   - SEVERE (8-10): Unable to stand or walk; unable to do usual activities.     8/10 - it takes awhile 3. ONSET: "When did these symptoms begin?" (e.g., hours, days, weeks, months)     2 months now 4. CAUSE: "What do you think is causing the weakness or fatigue?" (e.g., not drinking enough fluids, medical problem, trouble sleeping)     PSP 5. NEW MEDICINES:  "Have you started on any new medicines recently?" (e.g., opioid pain medicines, benzodiazepines, muscle relaxants, antidepressants, antihistamines, neuroleptics, beta blockers)     no 6. OTHER SYMPTOMS: "Do you have any other symptoms?" (e.g., chest pain, fever, cough, SOB, vomiting, diarrhea, bleeding, other areas of pain)      Vision has also gotten worse  Protocols used: Weakness (Generalized) and Fatigue-A-AH

## 2022-05-07 NOTE — Telephone Encounter (Signed)
FYI

## 2022-05-11 ENCOUNTER — Ambulatory Visit (HOSPITAL_COMMUNITY)
Admission: RE | Admit: 2022-05-11 | Discharge: 2022-05-11 | Disposition: A | Discharge status: Home / Self Care | Payer: 59 | Source: Ambulatory Visit | Attending: Internal Medicine | Admitting: Internal Medicine

## 2022-05-11 DIAGNOSIS — R6339 Other feeding difficulties: Secondary | ICD-10-CM | POA: Diagnosis not present

## 2022-05-11 DIAGNOSIS — R933 Abnormal findings on diagnostic imaging of other parts of digestive tract: Secondary | ICD-10-CM | POA: Insufficient documentation

## 2022-05-11 DIAGNOSIS — E119 Type 2 diabetes mellitus without complications: Secondary | ICD-10-CM | POA: Insufficient documentation

## 2022-05-11 DIAGNOSIS — K219 Gastro-esophageal reflux disease without esophagitis: Secondary | ICD-10-CM | POA: Diagnosis not present

## 2022-05-11 DIAGNOSIS — R1310 Dysphagia, unspecified: Secondary | ICD-10-CM

## 2022-05-11 DIAGNOSIS — R1312 Dysphagia, oropharyngeal phase: Secondary | ICD-10-CM | POA: Diagnosis not present

## 2022-05-11 DIAGNOSIS — I10 Essential (primary) hypertension: Secondary | ICD-10-CM | POA: Diagnosis not present

## 2022-05-11 DIAGNOSIS — R531 Weakness: Secondary | ICD-10-CM | POA: Insufficient documentation

## 2022-05-11 NOTE — Therapy (Signed)
Modified Barium Swallow Study  Patient Details  Name: Kristi Campos MRN: [id number] Date of Birth: 02-09-1951  Today's Date: 05/11/2022  Modified Barium Swallow completed.  Full report located under Chart Review in the Imaging Section.  History of Present Illness Kristi Campos is a 72 y.o. 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 (Dr. Carles Campos) During appointment in August of 2023, patient's daughter reporting jaw fatigue and trouble chewing, with neurologist concerned for claudication. During neurology appointment on 04/30/22, patient reported to her neurologist that she was having more trouble swallowing and is occuring mainly in the morning.  MBS ordered to assess swallow function.   Clinical Impression Kristi Campos is presenting with a moderately impaired oral phase of swallow and mildly impaired pharyngeal phase of swallow. In addition, esophageal sweep during swallow of the 50m barium tablet revealed stasis of tablet and radiology PA reporting that the tablet "does not pass beyond the upper esophagus."During oral phase of swallow, patient had the most difficulty with honey thick liquids, puree solids, mechanical soft solids. She exhibited disorganized lingual movement/manipulation of boluses leading to back and forth movement of bolus in anterior and medial portion of oral cavity with eventual bolus transit to posteior portion of oral cavity. 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 During pharyngeal phase, swallow was initiated at level of the vallecular sinus for honey thick, puree, mechanical soft solids and at the pyriform sinus for thin and nectar thick liquids. One instance of flash penetration of thin liquids observed (during consecutive cup sips) but penetrate remained above the vocal cords and fully exited laryngeal vestibule without intervention. No aspiration observed during any  phase of the swallow. Full pharyngeal clearance of boluses observed for all consistencies and no retrograde movement of barium observed. Appearance of cricopharyngeal bar observed.  SLP recommends monitoring of patient's swallow function and repeat MBS if having significant changes.  Factors that may increase risk of adverse event in presence of aspiration (Kristi Campos& Kristi Campos 2021):    Swallow Evaluation Recommendations Recommendations: PO diet PO Diet Recommendation: Dysphagia 3 (Mechanical soft);Thin liquids (Level 0) Liquid Administration via: Cup;Straw Medication Administration: Whole meds with puree Supervision: Full assist for feeding Swallowing strategies  : Minimize environmental distractions;Small bites/sips Postural changes: Stay upright 30-60 min after meals;Position pt fully upright for meals Oral care recommendations: Oral care BID (2x/day)     JSonia Baller MA, CCC-SLP Speech Therapy

## 2022-05-17 ENCOUNTER — Other Ambulatory Visit: Payer: Self-pay | Admitting: Neurology

## 2022-05-17 ENCOUNTER — Other Ambulatory Visit: Payer: Self-pay | Admitting: Internal Medicine

## 2022-05-31 NOTE — Progress Notes (Signed)
BNP was ordered due to pt's chest pain to assess for possible heart failure.

## 2022-06-01 ENCOUNTER — Other Ambulatory Visit: Payer: Self-pay | Admitting: Internal Medicine

## 2022-06-01 DIAGNOSIS — E669 Obesity, unspecified: Secondary | ICD-10-CM

## 2022-06-01 NOTE — Telephone Encounter (Signed)
Requested Prescriptions  Pending Prescriptions Disp Refills   JARDIANCE 25 MG TABS tablet [Pharmacy Med Name: JARDIANCE 25MG  TABLETS] 90 tablet 0    Sig: TAKE 1 TABLET(25 MG) BY MOUTH DAILY     Endocrinology:  Diabetes - SGLT2 Inhibitors Failed - 06/01/2022 10:17 AM      Failed - Cr in normal range and within 360 days    Creatinine, Ser  Date Value Ref Range Status  04/21/2022 1.17 (H) 0.44 - 1.00 mg/dL Final   Creatinine,U  Date Value Ref Range Status  10/24/2018 67.4 mg/dL Final         Failed - HBA1C is between 0 and 7.9 and within 180 days    HbA1c, POC (controlled diabetic range)  Date Value Ref Range Status  10/01/2021 11.1 (A) 0.0 - 7.0 % Final         Failed - eGFR in normal range and within 360 days    GFR calc Af Amer  Date Value Ref Range Status  12/06/2019 74 >59 mL/min/1.73 Final    Comment:    **[facility] currently reports eGFR in compliance with the current**   recommendations of the [employer]. [facility] will   update reporting as new guidelines are published from the NKF-ASN   Task force.    GFR, Estimated  Date Value Ref Range Status  04/21/2022 50 (L) >60 mL/min Final    Comment:    (NOTE) Calculated using the CKD-EPI Creatinine Equation (2021)    eGFR  Date Value Ref Range Status  11/23/2021 42 (L) >59 mL/min/1.73 Final         Failed - Valid encounter within last 6 months    Recent Outpatient Visits           6 months ago Jaw claudication   [place], MD   6 months ago Diabetes mellitus type 2 in obese [facility])   [place], Hassell L, RPH-CPP   7 months ago Fatigue, unspecified type   [place], MD   8 months ago Recurrent falls   [place], MD   10 months ago Diabetes mellitus type 2 in obese [facility])   [place], MD       Future Appointments             In 3 weeks Ladell Pier, MD [place]

## 2022-06-03 DIAGNOSIS — M7662 Achilles tendinitis, left leg: Secondary | ICD-10-CM | POA: Diagnosis not present

## 2022-06-03 DIAGNOSIS — M71572 Other bursitis, not elsewhere classified, left ankle and foot: Secondary | ICD-10-CM | POA: Diagnosis not present

## 2022-06-03 DIAGNOSIS — M7732 Calcaneal spur, left foot: Secondary | ICD-10-CM | POA: Diagnosis not present

## 2022-06-03 DIAGNOSIS — M79672 Pain in left foot: Secondary | ICD-10-CM | POA: Diagnosis not present

## 2022-06-09 NOTE — Progress Notes (Unsigned)
Assessment/Plan:   1.  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  Understands that there is an increased risk of falls, aspiration as subsequent morbidity and mortality from this disease compared to Parkinson's disease.             -Patient did not do well off of levodopa, so wants to continue carbidopa/levodopa 25/100, 1 tablet 3 times per day             -Patient does not drive.  She does live by herself, but daughter lives next door.             -I will reach out to adept to see what is holding up the wheelchair.  May just be insurance but want to make sure there is nothing that we can do.     2.  Eyelid opening apraxia             -Not interested in Botox right now.   3.  Vision change/generalized fatigue jaw claudication             -TA biopsy negative             -Her vision issues really sound most consistent with PSP.  She has diplopia and bilateral blurry vision (which is likely a combination of cataracts as well as PSP issues).  She is having no unilateral vision change.     4.  Dizziness             -Improved after blood pressure medications decreased   5.  Dysphagia             -She had a modified barium swallow May 11, 2022.  This demonstrated moderately impaired oral phase and mildly impaired pharyngeal phase.  No aspiration was observed.  Mechanical soft diet with thin liquids was recommended.   Subjective:   Kristi Campos was seen today in follow-up for PSP.  She is back on levodopa.  She did not feel well off of it.  She had a modified barium swallow March 12.  This demonstrated moderately impaired oral phase and mildly impaired pharyngeal phase.  No aspiration was observed.  Mechanical soft diet with thin liquids was recommended.  We did order a power wheelchair and her insurance declined it.   Current prescribed movement disorder medications: Carbidopa/levodopa 25/100, 1 tablet 3 times per  day.    ALLERGIES:   Allergies  Allergen Reactions   Metformin And Related Diarrhea    CURRENT MEDICATIONS:  Outpatient Encounter Medications as of 06/10/2022  Medication Sig   aspirin EC 81 MG tablet Take 81 mg by mouth daily.   atorvastatin (LIPITOR) 10 MG tablet Take 1 tablet (10 mg total) by mouth daily.   Blood Glucose Monitoring Suppl (ACCU-CHEK GUIDE) w/Device KIT Use to check blood sugar TID. Dx E11.69   carbidopa-levodopa (SINEMET IR) 25-100 MG tablet TAKE 1 TABLET BY MOUTH THREE TIMES DAILY   Continuous Blood Gluc Receiver (FREESTYLE LIBRE READER) DEVI UAD (Patient not taking: Reported on 04/29/2022)   Continuous Blood Gluc Sensor (FREESTYLE LIBRE SENSOR SYSTEM) MISC Change sensor Q 2 wks (Patient not taking: Reported on 04/29/2022)   docusate sodium (COLACE) 100 MG capsule Take 1 capsule (100 mg total) by mouth every 12 (twelve) hours.   glucose blood (ACCU-CHEK GUIDE) test strip Use to check blood sugar TID. Dx E11.69   insulin glargine (LANTUS SOLOSTAR) 100 UNIT/ML  Solostar Pen Inject 15 Units into the skin daily.   insulin lispro (HUMALOG KWIKPEN) 100 UNIT/ML KwikPen Give with meals. If BS 200-250 give 2 units Subcut; BS 251-300 4 units, BS 301-350 6 units, 351-400 8 units, 401-450 10 units, >450 give 13 units.   Insulin Pen Needle (PEN NEEDLES) 31G X 8 MM MISC UAD   JARDIANCE 25 MG TABS tablet TAKE 1 TABLET(25 MG) BY MOUTH DAILY   losartan-hydrochlorothiazide (HYZAAR) 100-25 MG tablet TAKE 1/2 TABLET BY MOUTH DAILY   nitroGLYCERIN (NITROSTAT) 0.4 MG SL tablet Place 1 tablet (0.4 mg total) under the tongue every 5 (five) minutes as needed for chest pain.   pantoprazole (PROTONIX) 40 MG tablet TAKE 1 TABLET BY MOUTH DAILY AT NOON   predniSONE (DELTASONE) 20 MG tablet 30 tabs PO daily   Semaglutide,0.25 or 0.5MG /DOS, (OZEMPIC, 0.25 OR 0.5 MG/DOSE,) 2 MG/3ML SOPN Inject 0.5 mg into the skin once a week.   traMADol (ULTRAM) 50 MG tablet Take 1 tablet (50 mg total) by mouth every  6 (six) hours as needed.   [DISCONTINUED] simvastatin (ZOCOR) 10 MG tablet Take 10 mg by mouth at bedtime.   No facility-administered encounter medications on file as of 06/10/2022.    Objective:   PHYSICAL EXAMINATION:    VITALS:   There were no vitals filed for this visit.     GEN:  The patient appears stated age and is in NAD. HEENT:  Normocephalic, atraumatic.  The mucous membranes are moist. The superficial temporal arteries are without ropiness or tenderness.  No STA artery bruits.  No jaw tenderness with opening/closing or palpation CV  RRR Lungs:  CTAB Neck:  no bruits   Neurological examination:  Orientation: The patient is alert and oriented x3.  Cranial nerves: There is good facial symmetry. There is facial hypomimia.  Extraocular muscles are intact.  Downgaze was just slightly decreased. No significant square wave jerks.  The visual fields are full to confrontational testing. The speech is fluent and dysarthric. some stuttering (same as previous).  She has trouble with the gutteral sounds.  Soft palate rises symmetrically and there is no tongue deviation. Hearing is intact to conversational tone. Sensation: Sensation is intact to light touch Motor: Strength is 5/5 in the bilateral upper and lower extremities.   Shoulder shrug is equal and symmetric.  There is no pronator drift.    Movement examination: Tone: There is normal tone in the bilateral upper extremities.  The tone in the lower extremities is normal.  Abnormal movements: None Coordination:  There is decremation, with any form of RAMS, including alternating supination and pronation of the forearm, hand opening and closing, finger taps, heel taps and toe taps on the L Gait and Station: The patient has difficulty arising out of a deep-seated chair without the use of the hands.  She has start hesitation.  Gait is shuffling.  She walks on the toes, especially on the right.  She turns en bloc and freezes in the turn.   The R foot sticks to the floor in the turn.   I have reviewed and interpreted the following labs independently    Chemistry      Component Value Date/Time   NA 138 04/21/2022 1329   NA 144 11/23/2021 1118   K 3.9 04/21/2022 1329   CL 104 04/21/2022 1329   CO2 25 04/21/2022 1329   BUN 17 04/21/2022 1329   BUN 17 11/23/2021 1118   CREATININE 1.17 (H) 04/21/2022 1329  Component Value Date/Time   CALCIUM 9.1 04/21/2022 1329   ALKPHOS 61 04/21/2022 1329   AST 17 04/21/2022 1329   ALT <5 04/21/2022 1329   BILITOT 0.8 04/21/2022 1329   BILITOT 0.3 10/22/2021 1134       Lab Results  Component Value Date   WBC 5.4 04/21/2022   HGB 13.4 04/21/2022   HCT 40.7 04/21/2022   MCV 93.3 04/21/2022   PLT 253 04/21/2022    Lab Results  Component Value Date   TSH 0.984 07/23/2021   Lab Results  Component Value Date   ESRSEDRATE 32 11/23/2021   Lab Results  Component Value Date   CRP 7 11/23/2021     Total time spent on today's visit was *** minutes, including both face-to-face time and nonface-to-face time.  Time included that spent on review of records (prior notes available to me/labs/imaging if pertinent), discussing treatment and goals, answering patient's questions and coordinating care.  Cc:  Marcine Matar, MD

## 2022-06-10 ENCOUNTER — Encounter: Payer: Self-pay | Admitting: Neurology

## 2022-06-10 ENCOUNTER — Ambulatory Visit (INDEPENDENT_AMBULATORY_CARE_PROVIDER_SITE_OTHER): Payer: 59 | Admitting: Neurology

## 2022-06-10 VITALS — BP 120/70 | HR 90 | Resp 18 | Ht 63.0 in | Wt 192.0 lb

## 2022-06-10 DIAGNOSIS — G231 Progressive supranuclear ophthalmoplegia [Steele-Richardson-Olszewski]: Secondary | ICD-10-CM

## 2022-06-10 DIAGNOSIS — R1319 Other dysphagia: Secondary | ICD-10-CM | POA: Diagnosis not present

## 2022-06-14 DIAGNOSIS — M5416 Radiculopathy, lumbar region: Secondary | ICD-10-CM | POA: Diagnosis not present

## 2022-06-14 DIAGNOSIS — M25511 Pain in right shoulder: Secondary | ICD-10-CM | POA: Diagnosis not present

## 2022-06-14 DIAGNOSIS — M5136 Other intervertebral disc degeneration, lumbar region: Secondary | ICD-10-CM | POA: Diagnosis not present

## 2022-06-17 DIAGNOSIS — M7662 Achilles tendinitis, left leg: Secondary | ICD-10-CM | POA: Diagnosis not present

## 2022-06-18 ENCOUNTER — Other Ambulatory Visit: Payer: Self-pay

## 2022-06-18 ENCOUNTER — Telehealth: Payer: Self-pay | Admitting: Anesthesiology

## 2022-06-18 DIAGNOSIS — G231 Progressive supranuclear ophthalmoplegia [Steele-Richardson-Olszewski]: Secondary | ICD-10-CM

## 2022-06-18 MED ORDER — AMBULATORY NON FORMULARY MEDICATION: 0 refills | Status: AC

## 2022-06-18 NOTE — Telephone Encounter (Signed)
Pt's daughter Kristi Campos called stating she had spoken with someone from [employer] pt's insurance stating they need for Dr Tat to send them an order/Rx for a power wheelchair and insurance will cover it.

## 2022-06-18 NOTE — Telephone Encounter (Signed)
Called patients daughter and RX needs to be sent to insurance they gave her a number of [phone]

## 2022-06-18 NOTE — Telephone Encounter (Signed)
Spoke to [facility] they are saying it is still in denial. Called Zenia Resides and she is still working on getting an approval for medicare in order to do the appeal. Eunice Blase number is [phone]

## 2022-06-24 ENCOUNTER — Encounter: Payer: Self-pay | Admitting: Internal Medicine

## 2022-06-24 ENCOUNTER — Ambulatory Visit: Payer: 59 | Attending: Internal Medicine | Admitting: Internal Medicine

## 2022-06-24 VITALS — BP 140/80 | HR 71 | Temp 98.0°F | Ht 63.0 in | Wt 195.0 lb

## 2022-06-24 DIAGNOSIS — I152 Hypertension secondary to endocrine disorders: Secondary | ICD-10-CM

## 2022-06-24 DIAGNOSIS — N1831 Chronic kidney disease, stage 3a: Secondary | ICD-10-CM | POA: Insufficient documentation

## 2022-06-24 DIAGNOSIS — E1169 Type 2 diabetes mellitus with other specified complication: Secondary | ICD-10-CM

## 2022-06-24 DIAGNOSIS — G231 Progressive supranuclear ophthalmoplegia [Steele-Richardson-Olszewski]: Secondary | ICD-10-CM | POA: Diagnosis not present

## 2022-06-24 DIAGNOSIS — H539 Unspecified visual disturbance: Secondary | ICD-10-CM

## 2022-06-24 DIAGNOSIS — Z789 Other specified health status: Secondary | ICD-10-CM | POA: Insufficient documentation

## 2022-06-24 DIAGNOSIS — E1159 Type 2 diabetes mellitus with other circulatory complications: Secondary | ICD-10-CM | POA: Diagnosis not present

## 2022-06-24 DIAGNOSIS — E785 Hyperlipidemia, unspecified: Secondary | ICD-10-CM | POA: Diagnosis not present

## 2022-06-24 DIAGNOSIS — Z9181 History of falling: Secondary | ICD-10-CM

## 2022-06-24 DIAGNOSIS — E669 Obesity, unspecified: Secondary | ICD-10-CM

## 2022-06-24 DIAGNOSIS — Z7985 Long-term (current) use of injectable non-insulin antidiabetic drugs: Secondary | ICD-10-CM | POA: Diagnosis not present

## 2022-06-24 DIAGNOSIS — R269 Unspecified abnormalities of gait and mobility: Secondary | ICD-10-CM

## 2022-06-24 LAB — POCT GLYCOSYLATED HEMOGLOBIN (HGB A1C): HbA1c, POC (controlled diabetic range): 8.5 % — AB (ref 0.0–7.0)

## 2022-06-24 LAB — GLUCOSE, POCT (MANUAL RESULT ENTRY): POC Glucose: 168 mg/dl — AB (ref 70–99)

## 2022-06-24 NOTE — Progress Notes (Signed)
Patient ID: Kristi Campos, female    DOB: Jul 11, 1950  MRN: [id number]  CC: Diabetes (DM f/u./Eye vision concerns, black dot / shadow in the middle of vision of L eye/Fall x1 mo)   Subjective: Kristi Campos is a 72 y.o. 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   Daughter, [name], is with her.   Did not bring meds  PSP: Temporal artery biopsy done since last visit was negative for giant cell arthritis.  Saw her neurologist Dr. Arbutus Leas earlier this month.  She was trying to get patient approved for mobility chair but it was declined by her insurance.  Daughter called the insurance and find that it is covered.  She passed that information on to the neurologist office and they are working on getting it approved.  She ordered OT/ST for home but looks like orders were never entered.  Daughter and patient would like for her to get some home OT and speech therapy.  Patient has problems getting her words out at times.. Had modified barium swallow last month for dysphagia which was abnormal.  Recommended mechanical soft diet with thin liquids. -fell 1 mth ago, hit forehead.  Loss balance getting off commode.    -Encouraged to pursue an advanced directive by neurology.  She brings signed HCPA today.  Would not want life support or feeding tube.  DM:  .BS 168/A1C 8.5 Currently on Lantus insulin 12 units daily, Jardiance 25 mg daily, and Ozempic 0.5 mg once a wk -CGM made arm sore; did not like it.  Checks BS in a.m before BF; range 140-190 Taking Lipitor 10 mg daily Eye vision concerns, black dot / shadow in the middle of vision of L eye x 2 mths.  Appointment with Dr. Dione Booze scheduled for August. We have been keeping an eye on her kidney function.  Recent GFR was at 50.  HTN: Currently on Losartan/HCTZ 100/25 mg 1/2 tab daily Uses  wrist device Has readings with her today:  Range 114/78, 111/77, 120/96, 130/81  Patient Active Problem List   Diagnosis Date Noted   Stage 3a chronic kidney disease 06/24/2022   Progressive supranuclear palsy 01/27/2021   AKI (acute kidney injury) 10/18/2020   Chest pain 10/17/2020   Primary osteoarthritis, right wrist 02/28/2020   Peripheral arterial disease 10/03/2019   Essential hypertension 10/03/2019   Hyperlipidemia 10/03/2019   Diabetes mellitus type 2 in obese 11/24/2018   Chronic right-sided low back pain without sciatica 10/24/2018     Current Outpatient Medications on File Prior to Visit  Medication Sig Dispense Refill   AMBULATORY NON FORMULARY MEDICATION Please dispense 1 power wheelchair 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     atorvastatin (LIPITOR) 10 MG tablet Take 1 tablet (10 mg total) by mouth daily. 90 tablet 1   Blood Glucose Monitoring Suppl (ACCU-CHEK GUIDE) w/Device KIT Use to check blood sugar TID. Dx E11.69 1 kit 0   carbidopa-levodopa (SINEMET IR) 25-100 MG tablet TAKE 1 TABLET BY MOUTH THREE TIMES DAILY 270 tablet 1   Continuous Blood Gluc Receiver (FREESTYLE LIBRE READER) DEVI UAD 1 each 0   Continuous Blood Gluc Sensor (FREESTYLE LIBRE SENSOR SYSTEM) MISC Change sensor Q 2 wks 2 each 12   glucose blood (ACCU-CHEK  GUIDE) test strip Use to check blood sugar TID. Dx E11.69 100 each 2   insulin glargine (LANTUS SOLOSTAR) 100 UNIT/ML Solostar Pen Inject 15 Units into the skin daily. 15 mL PRN   Insulin Pen Needle (PEN NEEDLES) 31G X 8 MM MISC UAD 100 each 6   JARDIANCE 25 MG TABS tablet TAKE 1 TABLET(25 MG) BY MOUTH DAILY 90 tablet 0   losartan-hydrochlorothiazide (HYZAAR) 100-25 MG tablet TAKE 1/2 TABLET BY MOUTH DAILY 45 tablet 0   nitroGLYCERIN (NITROSTAT) 0.4 MG SL tablet Place 1 tablet (0.4 mg total) under the tongue every 5 (five) minutes as needed for chest pain. 20 tablet 0   pantoprazole  (PROTONIX) 40 MG tablet TAKE 1 TABLET BY MOUTH DAILY AT NOON 90 tablet 2   Semaglutide,0.25 or 0.5MG /DOS, (OZEMPIC, 0.25 OR 0.5 MG/DOSE,) 2 MG/3ML SOPN Inject 0.5 mg into the skin once a week. 3 mL 2   traMADol (ULTRAM) 50 MG tablet Take 1 tablet (50 mg total) by mouth every 6 (six) hours as needed. 10 tablet 0   docusate sodium (COLACE) 100 MG capsule Take 1 capsule (100 mg total) by mouth every 12 (twelve) hours. (Patient not taking: Reported on 06/24/2022) 30 capsule 0   [DISCONTINUED] simvastatin (ZOCOR) 10 MG tablet Take 10 mg by mouth at bedtime.     No current facility-administered medications on file prior to visit.    Allergies  Allergen Reactions   Metformin And Related Diarrhea    Social History   Socioeconomic History   Marital status: Widowed    Spouse name: Not on file   Number of children: 4   Years of education: Not on file   Highest education level: Not on file  Occupational History    Employer: other   Occupation: retired    Comment: weave for living  Tobacco Use   Smoking status: Never   Smokeless tobacco: Never  Vaping Use   Vaping Use: Never used  Substance and Sexual Activity   Alcohol use: No    Alcohol/week: 0.0 standard drinks of alcohol   Drug use: No   Sexual activity: Not Currently    Partners: Male    Birth control/protection: Post-menopausal  Other Topics Concern   Not on file  Social History Narrative   Right handed    Lives at home alone ONE LEVEL   Caffeine1 cup daily   Social Determinants of Health   Financial Resource Strain: Low Risk  (04/29/2022)   Overall Financial Resource Strain (CARDIA)    Difficulty of Paying Living Expenses: Not hard at all  Food Insecurity: No Food Insecurity (04/29/2022)   Hunger Vital Sign    Worried About Running Out of Food in the Last Year: Never true    Ran Out of Food in the Last Year: Never true  Transportation Needs: No Transportation Needs (04/29/2022)   PRAPARE - Scientist, research (physical sciences) (Medical): No    Lack of Transportation (Non-Medical): No  Physical Activity: Inactive (04/29/2022)   Exercise Vital Sign    Days of Exercise per Week: 0 days    Minutes of Exercise per Session: 0 min  Stress: No Stress Concern Present (04/29/2022)   [employer] of Occupational Health - Occupational Stress Questionnaire    Feeling of Stress : Not at all  Social Connections: Not on file  Intimate Partner Violence: Not on file    Family History  Problem Relation Age of Onset   Bipolar disorder Mother  Hypertension Sister    Hypertension Sister    Hypertension Sister    Hypertension Sister    Hypertension Sister    Hypertension Sister    Hypertension Daughter    Diabetes Son    Prostate cancer Son     Past Surgical History:  Procedure Laterality Date   ARTERY BIOPSY Left 11/26/2021   Procedure: BIOPSY TEMPORAL ARTERY;  Surgeon: Leonie Douglas, MD;  Location: [facility] OR;  Service: Vascular;  Laterality: Left;   COLONOSCOPY     I & D EXTREMITY Right 08/09/2013   Procedure: MINOR IRRIGATION AND DEBRIDEMENT EXTREMITY;  Surgeon: Wyn Forster, MD;  Location: [facility];  Service: Orthopedics;  Laterality: Right;  long       wound class 4    ROS: Review of Systems Negative except as stated above  PHYSICAL EXAM: BP (!) 140/80   Pulse 71   Temp 98 F (36.7 C) (Oral)   Ht  (1.6 m)   Wt 195 lb (88.5 kg)   SpO2 98%   BMI 34.54 kg/m   Wt Readings from Last 3 Encounters:  06/24/22 195 lb (88.5 kg)  06/10/22 192 lb (87.1 kg)  04/29/22 189 lb (85.7 kg)    Physical Exam  General appearance - alert, well appearing, older AAM and in no distress Mental status -normal mood.  Patient has some apraxia of the eyes.  Has problems getting words out Neck - supple, no significant adenopathy Chest - clear to auscultation, no wheezes, rales or rhonchi, symmetric air entry Heart - normal rate, regular rhythm, normal S1, S2, no murmurs, rubs,  clicks or gallops Musculoskeletal - ambulates with rollator [place]au gait is stiff and slow. Extremities - peripheral pulses normal, no pedal edema, no clubbing or cyanosis      Latest Ref Rng & Units 04/21/2022    1:29 PM 11/26/2021    7:15 AM 11/23/2021   11:18 AM  CMP  Glucose 70 - 99 mg/dL 161  096  98   BUN 8 - 23 mg/dL Creatinine 0.44 - 1.00 mg/dL 0.45  4.09  8.11   Sodium 135 - 145 mmol/L 138  142  144   Potassium 3.5 - 5.1 mmol/L 3.9  3.7  5.0   Chloride 98 - 111 mmol/L 104  105  100   CO2 22 - 32 mmol/L 25   24   Calcium 8.9 - 10.3 mg/dL 9.1   91.4   Total Protein 6.5 - 8.1 g/dL 7.2     Total Bilirubin 0.3 - 1.2 mg/dL 0.8     Alkaline Phos 38 - 126 U/L 61     AST 15 - 41 U/L 17     ALT 0 - 44 U/L <5      Lipid Panel     Component Value Date/Time   CHOL 154 01/27/2021 1134   TRIG 139 01/27/2021 1134   HDL 56 01/27/2021 1134   CHOLHDL 2.8 01/27/2021 1134   LDLCALC 74 01/27/2021 1134    CBC    Component Value Date/Time   WBC 5.4 04/21/2022 1329   RBC 4.36 04/21/2022 1329   HGB 13.4 04/21/2022 1329   HGB 13.9 10/22/2021 1134   HCT 40.7 04/21/2022 1329   HCT 41.8 10/22/2021 1134   PLT 253 04/21/2022 1329   PLT 277 10/22/2021 1134   MCV 93.3 04/21/2022 1329   MCV 92 10/22/2021 1134   MCH 30.7 04/21/2022 1329  MCHC 32.9 04/21/2022 1329   RDW 14.6 04/21/2022 1329   RDW 13.9 10/22/2021 1134   LYMPHSABS 2.1 04/21/2022 1329   MONOABS 0.5 04/21/2022 1329   EOSABS 0.1 04/21/2022 1329   BASOSABS 0.0 04/21/2022 1329    ASSESSMENT AND PLAN: 1. Type 2 diabetes mellitus with obesity Not at goal Rec increase Lantus back to 15 units.  Cont Ozempic 0.5 mg and Jardiance - POCT glucose (manual entry) - POCT glycosylated hemoglobin (Hb A1C) - Microalbumin / creatinine urine ratio  2. Progressive supranuclear palsy 3. History of recent fall 4. Gait disturbance Will submit referral for home PT and speech therapy.  Physical therapy can help with gait  training/safety techniques.  Patient continues to live alone.  Daughter lives next-door. - Ambulatory referral to Home Health  5. Hypertension associated with diabetes Not at goal today but improve upon repeat.  Home blood pressure readings have been better.  No changes made in Cozaar/HCTZ.  6. Hyperlipidemia associated with type 2 diabetes mellitus Continue Lipitor  7. Stage 3a chronic kidney disease Continue to monitor.  Avoid NSAIDs  8. Vision changes Advised daughter to call Dr. Laruth Bouchard office and request to have her eye appointment moved up given her current symptoms in the left eye  9. Patient has active power of attorney for health care I reviewed the advance directive document that they brought in today.  It is a healthcare power of attorney naming her daughter as her healthcare power of attorney.  It has been signed and notarized.  Also discussed living will which was not completed.  Patient and daughter stated that she would not want to have a feeding tube or ever placed on life support.  Encouraged him to consider filling out the living will portion of the document as well. Copy of healthcare power of attorney sent to be scanned in the chart.     Patient was given the opportunity to ask questions.  Patient verbalized understanding of the plan and was able to repeat key elements of the plan.   This documentation was completed using Paediatric nurse.  Any transcriptional errors are unintentional.  Orders Placed This Encounter  Procedures   Microalbumin / creatinine urine ratio   Ambulatory referral to Home Health   POCT glucose (manual entry)   POCT glycosylated hemoglobin (Hb A1C)     Requested Prescriptions    No prescriptions requested or ordered in this encounter    Return in about 4 months (around 10/24/2022).  Jonah Blue, MD, FACP

## 2022-06-24 NOTE — Patient Instructions (Signed)
Your diabetes has improved but not at goal.  Increase Lantus insulin from 12 units daily to 15 units daily.  Continue to monitor blood sugars with goal of morning blood sugars before meals being 90-130.  I have put in a request for home speech therapy and physical therapy.  You should call and request a follow-up visit with Dr. Dione Booze for your vision changes.

## 2022-06-25 ENCOUNTER — Telehealth: Payer: Self-pay

## 2022-06-25 LAB — MICROALBUMIN / CREATININE URINE RATIO
Creatinine, Urine: 36.7 mg/dL
Microalb/Creat Ratio: <8 mg/g creat (ref 0–29)
Microalbumin, Urine: <3 ug/mL

## 2022-06-25 NOTE — Telephone Encounter (Addendum)
Referral received for home PT and ST. I spoke to patient's daughter, Kristi Campos to inquire if they have a preference for home health agencies.  She said that her mother had Kristi Campos, Kristi Campos in the past and they would like her back if possible.  Kristi Campos worked with [facility].  I called [facility] and spoke to Kristi [place] who said that they do not have PT available until at least the end of next week or beginning of the following week and they are not allowed to hold a referral that long.  She also stated that they do not allow ST to open a case.   I called patient's daughter, Kristi Campos, back and explained the outcome of my call with [facility].  [name] requested I try other agencies because she is anxious to get ST started.  I explained that I can check multiple agencies but there is no guarantee that an agency will be able to accept the referral.  They need to be in network with her insurance and have staff available for the requested disciplines.  She said she understood   Message sent to [name], [facility] requesting she review referral.

## 2022-06-28 ENCOUNTER — Telehealth: Payer: Self-pay

## 2022-06-28 NOTE — Telephone Encounter (Signed)
Message received from [name], PT/ [facility] health stating they accepted the referral and start of care is today with PT.  I called patient's daughter, Wandra Mannan [phone]  to confirm that she is aware of start of care for home health services.  Message left with call back requested.

## 2022-06-28 NOTE — Telephone Encounter (Signed)
Copied from CRM [id number]. Topic: General - Other >> Jun 28, 2022  2:33 PM Clide Dales wrote: Darnelle with [facility] called to let provider know that evaluation of the patient has been moved to 4/30. Please call Darnelle with any questions.[phone]

## 2022-06-29 ENCOUNTER — Telehealth: Payer: Self-pay | Admitting: Internal Medicine

## 2022-06-29 DIAGNOSIS — I152 Hypertension secondary to endocrine disorders: Secondary | ICD-10-CM | POA: Diagnosis not present

## 2022-06-29 DIAGNOSIS — N1831 Chronic kidney disease, stage 3a: Secondary | ICD-10-CM | POA: Diagnosis not present

## 2022-06-29 DIAGNOSIS — E1122 Type 2 diabetes mellitus with diabetic chronic kidney disease: Secondary | ICD-10-CM | POA: Diagnosis not present

## 2022-06-29 DIAGNOSIS — E1159 Type 2 diabetes mellitus with other circulatory complications: Secondary | ICD-10-CM | POA: Diagnosis not present

## 2022-06-29 DIAGNOSIS — E1152 Type 2 diabetes mellitus with diabetic peripheral angiopathy with gangrene: Secondary | ICD-10-CM | POA: Diagnosis not present

## 2022-06-29 DIAGNOSIS — G231 Progressive supranuclear ophthalmoplegia [Steele-Richardson-Olszewski]: Secondary | ICD-10-CM | POA: Diagnosis not present

## 2022-06-29 NOTE — Telephone Encounter (Signed)
Kristi Campos with [facility]   Therapy: PT Frequency: 1 x  4  Ok given  to proceed with PT 1X4

## 2022-06-29 NOTE — Telephone Encounter (Signed)
Copied from CRM [id number]. Topic: Quick Communication - Home Health Verbal Orders >> Jun 29, 2022 11:20 AM Macon Large wrote: Caller/Agency: Brett Canales with [facility]  Callback Number: [phone] Requesting OT/PT/Skilled Nursing/Social Work/Speech Therapy: PT Frequency: 1 x  4

## 2022-06-30 DIAGNOSIS — H0288B Meibomian gland dysfunction left eye, upper and lower eyelids: Secondary | ICD-10-CM | POA: Diagnosis not present

## 2022-06-30 DIAGNOSIS — E119 Type 2 diabetes mellitus without complications: Secondary | ICD-10-CM | POA: Diagnosis not present

## 2022-06-30 DIAGNOSIS — H35033 Hypertensive retinopathy, bilateral: Secondary | ICD-10-CM | POA: Diagnosis not present

## 2022-06-30 DIAGNOSIS — H0288A Meibomian gland dysfunction right eye, upper and lower eyelids: Secondary | ICD-10-CM | POA: Diagnosis not present

## 2022-06-30 DIAGNOSIS — H2513 Age-related nuclear cataract, bilateral: Secondary | ICD-10-CM | POA: Diagnosis not present

## 2022-06-30 DIAGNOSIS — H43812 Vitreous degeneration, left eye: Secondary | ICD-10-CM | POA: Diagnosis not present

## 2022-06-30 DIAGNOSIS — H16223 Keratoconjunctivitis sicca, not specified as Sjogren's, bilateral: Secondary | ICD-10-CM | POA: Diagnosis not present

## 2022-06-30 NOTE — Telephone Encounter (Signed)
I spoke to patient's daughter, Wandra Mannan, who confirmed that the home health PT was out yesterday and they were told they should be hearing from ST soon

## 2022-07-01 ENCOUNTER — Ambulatory Visit: Payer: Self-pay

## 2022-07-01 DIAGNOSIS — E1152 Type 2 diabetes mellitus with diabetic peripheral angiopathy with gangrene: Secondary | ICD-10-CM | POA: Diagnosis not present

## 2022-07-01 DIAGNOSIS — N1831 Chronic kidney disease, stage 3a: Secondary | ICD-10-CM | POA: Diagnosis not present

## 2022-07-01 DIAGNOSIS — I152 Hypertension secondary to endocrine disorders: Secondary | ICD-10-CM | POA: Diagnosis not present

## 2022-07-01 DIAGNOSIS — G231 Progressive supranuclear ophthalmoplegia [Steele-Richardson-Olszewski]: Secondary | ICD-10-CM | POA: Diagnosis not present

## 2022-07-01 DIAGNOSIS — E1122 Type 2 diabetes mellitus with diabetic chronic kidney disease: Secondary | ICD-10-CM | POA: Diagnosis not present

## 2022-07-01 DIAGNOSIS — E1159 Type 2 diabetes mellitus with other circulatory complications: Secondary | ICD-10-CM | POA: Diagnosis not present

## 2022-07-01 NOTE — Telephone Encounter (Signed)
Chief Complaint: Need assistance with getting a power wheelchair Disposition: [] ED /[] Urgent Care (no appt availability in office) / [] Appointment(In office/virtual)/ []  [facility]/ [] Home Care/ [] Refused Recommended Disposition /[] [facility]/ [x]  Follow-up with PCP Additional Notes: Kristi Campos, daughter on DPR, called and says the patient fell on Saturday, no injury. She says she's trying to get the electric wheelchair approved for her that was initiated by the neurologist, but every time she calls the insurance they are telling her it is covered, but it keeps getting denied. She is asking for help from Dr. Laural Benes to get this approved. Advised I will send this to Dr. Laural Benes and someone will call back with additional questions and/or her recommendation.   Reason for Disposition  [1] Caller requesting NON-URGENT health information AND [2] PCP's office is the best resource  Answer Assessment - Initial Assessment Questions 1. REASON FOR CALL or QUESTION: "What is your reason for calling today?" or "How can I best help you?" or "What question do you have that I can help answer?"     Need assistance with getting the power wheelchair approved  Protocols used: Information Only Call - No Triage-A-AH

## 2022-07-02 NOTE — Telephone Encounter (Signed)
Spoke with Germain Osgood [facility] ok given for  Speech Therpy.    Frequency-one week 6,  to address speech and voice defects.

## 2022-07-02 NOTE — Telephone Encounter (Addendum)
Tawana Scale calling from [facility] is caling to request verbal orders for Speech Therpy.   Frequency-one week 6,  to address speech and voice defects.  CB- [phone]

## 2022-07-05 ENCOUNTER — Other Ambulatory Visit: Payer: Self-pay | Admitting: Internal Medicine

## 2022-07-05 DIAGNOSIS — E669 Obesity, unspecified: Secondary | ICD-10-CM

## 2022-07-06 DIAGNOSIS — E1122 Type 2 diabetes mellitus with diabetic chronic kidney disease: Secondary | ICD-10-CM | POA: Diagnosis not present

## 2022-07-06 DIAGNOSIS — E1152 Type 2 diabetes mellitus with diabetic peripheral angiopathy with gangrene: Secondary | ICD-10-CM | POA: Diagnosis not present

## 2022-07-06 DIAGNOSIS — G231 Progressive supranuclear ophthalmoplegia [Steele-Richardson-Olszewski]: Secondary | ICD-10-CM | POA: Diagnosis not present

## 2022-07-06 DIAGNOSIS — I152 Hypertension secondary to endocrine disorders: Secondary | ICD-10-CM | POA: Diagnosis not present

## 2022-07-06 DIAGNOSIS — E1159 Type 2 diabetes mellitus with other circulatory complications: Secondary | ICD-10-CM | POA: Diagnosis not present

## 2022-07-06 DIAGNOSIS — N1831 Chronic kidney disease, stage 3a: Secondary | ICD-10-CM | POA: Diagnosis not present

## 2022-07-09 DIAGNOSIS — E1122 Type 2 diabetes mellitus with diabetic chronic kidney disease: Secondary | ICD-10-CM | POA: Diagnosis not present

## 2022-07-09 DIAGNOSIS — G231 Progressive supranuclear ophthalmoplegia [Steele-Richardson-Olszewski]: Secondary | ICD-10-CM | POA: Diagnosis not present

## 2022-07-09 DIAGNOSIS — I152 Hypertension secondary to endocrine disorders: Secondary | ICD-10-CM | POA: Diagnosis not present

## 2022-07-09 DIAGNOSIS — E1152 Type 2 diabetes mellitus with diabetic peripheral angiopathy with gangrene: Secondary | ICD-10-CM | POA: Diagnosis not present

## 2022-07-09 DIAGNOSIS — N1831 Chronic kidney disease, stage 3a: Secondary | ICD-10-CM | POA: Diagnosis not present

## 2022-07-09 DIAGNOSIS — E1159 Type 2 diabetes mellitus with other circulatory complications: Secondary | ICD-10-CM | POA: Diagnosis not present

## 2022-07-12 DIAGNOSIS — I152 Hypertension secondary to endocrine disorders: Secondary | ICD-10-CM | POA: Diagnosis not present

## 2022-07-12 DIAGNOSIS — E1152 Type 2 diabetes mellitus with diabetic peripheral angiopathy with gangrene: Secondary | ICD-10-CM | POA: Diagnosis not present

## 2022-07-12 DIAGNOSIS — E1159 Type 2 diabetes mellitus with other circulatory complications: Secondary | ICD-10-CM | POA: Diagnosis not present

## 2022-07-12 DIAGNOSIS — E1122 Type 2 diabetes mellitus with diabetic chronic kidney disease: Secondary | ICD-10-CM | POA: Diagnosis not present

## 2022-07-12 DIAGNOSIS — N1831 Chronic kidney disease, stage 3a: Secondary | ICD-10-CM | POA: Diagnosis not present

## 2022-07-12 DIAGNOSIS — G231 Progressive supranuclear ophthalmoplegia [Steele-Richardson-Olszewski]: Secondary | ICD-10-CM | POA: Diagnosis not present

## 2022-07-13 ENCOUNTER — Telehealth: Payer: Self-pay

## 2022-07-13 NOTE — Telephone Encounter (Signed)
Per Eunice Blase Ro[facility]:   Our Tourist information centre manager   (ATP) wants to call patient to discuss the next steps for her power wheelchair due to denial.

## 2022-07-13 NOTE — Telephone Encounter (Signed)
I called patient's daughter, Wandra Mannan, and updated her with the status of the order  I also received a message from Zenia Resides, [facility], stating that they are working with Dr Tat on this order.  I provided [name] with Debbie's phone number in case she has further questions.

## 2022-07-19 DIAGNOSIS — N1831 Chronic kidney disease, stage 3a: Secondary | ICD-10-CM | POA: Diagnosis not present

## 2022-07-19 DIAGNOSIS — E1122 Type 2 diabetes mellitus with diabetic chronic kidney disease: Secondary | ICD-10-CM | POA: Diagnosis not present

## 2022-07-19 DIAGNOSIS — I152 Hypertension secondary to endocrine disorders: Secondary | ICD-10-CM | POA: Diagnosis not present

## 2022-07-19 DIAGNOSIS — G231 Progressive supranuclear ophthalmoplegia [Steele-Richardson-Olszewski]: Secondary | ICD-10-CM | POA: Diagnosis not present

## 2022-07-19 DIAGNOSIS — E1152 Type 2 diabetes mellitus with diabetic peripheral angiopathy with gangrene: Secondary | ICD-10-CM | POA: Diagnosis not present

## 2022-07-19 DIAGNOSIS — E1159 Type 2 diabetes mellitus with other circulatory complications: Secondary | ICD-10-CM | POA: Diagnosis not present

## 2022-07-27 DIAGNOSIS — G231 Progressive supranuclear ophthalmoplegia [Steele-Richardson-Olszewski]: Secondary | ICD-10-CM | POA: Diagnosis not present

## 2022-07-27 DIAGNOSIS — N1831 Chronic kidney disease, stage 3a: Secondary | ICD-10-CM | POA: Diagnosis not present

## 2022-07-27 DIAGNOSIS — E1122 Type 2 diabetes mellitus with diabetic chronic kidney disease: Secondary | ICD-10-CM | POA: Diagnosis not present

## 2022-07-27 DIAGNOSIS — E1159 Type 2 diabetes mellitus with other circulatory complications: Secondary | ICD-10-CM | POA: Diagnosis not present

## 2022-07-27 DIAGNOSIS — I152 Hypertension secondary to endocrine disorders: Secondary | ICD-10-CM | POA: Diagnosis not present

## 2022-07-27 DIAGNOSIS — E1152 Type 2 diabetes mellitus with diabetic peripheral angiopathy with gangrene: Secondary | ICD-10-CM | POA: Diagnosis not present

## 2022-07-29 DIAGNOSIS — E1122 Type 2 diabetes mellitus with diabetic chronic kidney disease: Secondary | ICD-10-CM | POA: Diagnosis not present

## 2022-07-29 DIAGNOSIS — N1831 Chronic kidney disease, stage 3a: Secondary | ICD-10-CM | POA: Diagnosis not present

## 2022-07-29 DIAGNOSIS — G231 Progressive supranuclear ophthalmoplegia [Steele-Richardson-Olszewski]: Secondary | ICD-10-CM | POA: Diagnosis not present

## 2022-07-29 DIAGNOSIS — E1159 Type 2 diabetes mellitus with other circulatory complications: Secondary | ICD-10-CM | POA: Diagnosis not present

## 2022-07-29 DIAGNOSIS — E1152 Type 2 diabetes mellitus with diabetic peripheral angiopathy with gangrene: Secondary | ICD-10-CM | POA: Diagnosis not present

## 2022-07-29 DIAGNOSIS — I152 Hypertension secondary to endocrine disorders: Secondary | ICD-10-CM | POA: Diagnosis not present

## 2022-08-02 DIAGNOSIS — M47816 Spondylosis without myelopathy or radiculopathy, lumbar region: Secondary | ICD-10-CM | POA: Diagnosis not present

## 2022-08-03 DIAGNOSIS — N1831 Chronic kidney disease, stage 3a: Secondary | ICD-10-CM | POA: Diagnosis not present

## 2022-08-03 DIAGNOSIS — E1152 Type 2 diabetes mellitus with diabetic peripheral angiopathy with gangrene: Secondary | ICD-10-CM | POA: Diagnosis not present

## 2022-08-03 DIAGNOSIS — E1122 Type 2 diabetes mellitus with diabetic chronic kidney disease: Secondary | ICD-10-CM | POA: Diagnosis not present

## 2022-08-03 DIAGNOSIS — G231 Progressive supranuclear ophthalmoplegia [Steele-Richardson-Olszewski]: Secondary | ICD-10-CM | POA: Diagnosis not present

## 2022-08-03 DIAGNOSIS — I152 Hypertension secondary to endocrine disorders: Secondary | ICD-10-CM | POA: Diagnosis not present

## 2022-08-03 DIAGNOSIS — E1159 Type 2 diabetes mellitus with other circulatory complications: Secondary | ICD-10-CM | POA: Diagnosis not present

## 2022-08-07 ENCOUNTER — Emergency Department (HOSPITAL_COMMUNITY)
Admission: EM | Admit: 2022-08-07 | Discharge: 2022-08-08 | Disposition: A | Discharge status: Home / Self Care | Payer: 59 | Attending: Medical | Admitting: Medical

## 2022-08-07 ENCOUNTER — Encounter (HOSPITAL_COMMUNITY): Payer: Self-pay

## 2022-08-07 ENCOUNTER — Emergency Department (HOSPITAL_COMMUNITY): Payer: 59

## 2022-08-07 ENCOUNTER — Other Ambulatory Visit: Payer: Self-pay

## 2022-08-07 DIAGNOSIS — Z7984 Long term (current) use of oral hypoglycemic drugs: Secondary | ICD-10-CM | POA: Insufficient documentation

## 2022-08-07 DIAGNOSIS — Z7982 Long term (current) use of aspirin: Secondary | ICD-10-CM | POA: Insufficient documentation

## 2022-08-07 DIAGNOSIS — R519 Headache, unspecified: Secondary | ICD-10-CM | POA: Diagnosis not present

## 2022-08-07 DIAGNOSIS — Z79899 Other long term (current) drug therapy: Secondary | ICD-10-CM | POA: Insufficient documentation

## 2022-08-07 DIAGNOSIS — I10 Essential (primary) hypertension: Secondary | ICD-10-CM | POA: Insufficient documentation

## 2022-08-07 DIAGNOSIS — Z794 Long term (current) use of insulin: Secondary | ICD-10-CM | POA: Diagnosis not present

## 2022-08-07 DIAGNOSIS — E119 Type 2 diabetes mellitus without complications: Secondary | ICD-10-CM | POA: Diagnosis not present

## 2022-08-07 NOTE — ED Provider Notes (Signed)
[place] EMERGENCY DEPARTMENT AT [facility] Provider Note   CSN: [id number] Arrival date & time: 08/07/22  1652     History  No chief complaint on file.   Kristi Campos is a 72 y.o. female.  HPI   Patient with medical history including hypertension, GERD, diabetes, PSP, presenting with complaints of left-sided headache.  Patient states that headaches are about a week ago, states it comes and goes, states that left-sided head, no associated change in vision, paresthesias or weakness upper lower extremities, nonnursing neck pain, currently has no pain at this time.  She states that she is here because she had a fall 1 month ago where she did hit her head, but never got evaluated, she afraid that something could be broken, she states that she is not having falls where she hit her head recently.  Reviewed patient's chart followed by [facility] neurology, as PSP with intermittent vision changes, chronic fatigue, dizziness.  Home Medications Prior to Admission medications   Medication Sig Start Date End Date Taking? 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    [provider]  atorvastatin (LIPITOR) 10 MG tablet Take 1 tablet (10 mg total) by mouth daily. 12/06/19   Anders Simmonds, PA-C  Blood Glucose Monitoring Suppl (ACCU-CHEK GUIDE) w/Device KIT Use to check blood sugar TID. Dx E11.69 10/30/21   Marcine Matar, MD  carbidopa-levodopa (SINEMET IR) 25-100 MG tablet TAKE 1 TABLET BY MOUTH THREE TIMES DAILY 05/17/22   Tat, Octaviano Batty, DO  Continuous Blood Gluc Receiver (FREESTYLE LIBRE READER) DEVI UAD 10/01/21   Marcine Matar, MD  Continuous Blood Gluc Sensor (FREESTYLE LIBRE SENSOR SYSTEM) MISC Change sensor Q 2 wks 10/01/21   Marcine Matar, MD  docusate sodium (COLACE) 100 MG capsule Take 1 capsule (100 mg  total) by mouth every 12 (twelve) hours. Patient not taking: Reported on 06/24/2022 10/14/21   Roxy Horseman, PA-C  glucose blood (ACCU-CHEK GUIDE) test strip USE AS DIRECTED THREE TIMES DAILY 07/05/22   Marcine Matar, MD  insulin glargine (LANTUS SOLOSTAR) 100 UNIT/ML Solostar Pen Inject 15 Units into the skin daily. 10/22/21   Marcine Matar, MD  Insulin Pen Needle (PEN NEEDLES) 31G X 8 MM MISC UAD 10/01/21   Marcine Matar, MD  JARDIANCE 25 MG TABS tablet TAKE 1 TABLET(25 MG) BY MOUTH DAILY 06/01/22   Marcine Matar, MD  losartan-hydrochlorothiazide [facility]) 100-25 MG tablet TAKE 1/2 TABLET BY MOUTH DAILY 05/17/22   Marcine Matar, MD  nitroGLYCERIN (NITROSTAT) 0.4 MG SL tablet Place 1 tablet (0.4 mg total) under the tongue every 5 (five) minutes as needed for chest pain. 10/18/20   Osvaldo Shipper, MD  pantoprazole (PROTONIX) 40 MG tablet TAKE 1 TABLET BY MOUTH DAILY AT NOON 04/22/22   Marcine Matar, MD  Semaglutide,0.25 or 0.5MG /DOS, (OZEMPIC, 0.25 OR 0.5 MG/DOSE,) 2 MG/3ML SOPN Inject 0.5 mg into the skin once a week. 02/19/22   Marcine Matar, MD  traMADol (ULTRAM) 50 MG tablet Take 1 tablet (50 mg total) by mouth every 6 (six) hours as needed. 11/26/21 11/26/22  Lars Mage, PA-C  simvastatin (ZOCOR) 10 MG tablet Take 10 mg by mouth at bedtime. 01/01/19 02/28/20  [provider]      Allergies    Metformin and related  Review of Systems   Review of Systems  Constitutional:  Negative for chills and fever.  Respiratory:  Negative for shortness of breath.   Cardiovascular:  Negative for chest pain.  Gastrointestinal:  Negative for abdominal pain.  Neurological:  Negative for headaches.    Physical Exam Updated Vital Signs BP (!) 156/89 (BP Location: Left Arm)   Pulse 66   Temp 97.8 F (36.6 C)   Resp 17   SpO2 100%  Physical Exam Vitals and nursing note reviewed.  Constitutional:      General: She is not in acute distress.    Appearance: She  is not ill-appearing.  HENT:     Head: Normocephalic and atraumatic.     Comments: No deformity of the head present no raccoon eyes or Battle sign noted.    Nose: No congestion.  Eyes:     Conjunctiva/sclera: Conjunctivae normal.  Cardiovascular:     Rate and Rhythm: Normal rate and regular rhythm.     Pulses: Normal pulses.     Heart sounds: No murmur heard.    No friction rub. No gallop.  Pulmonary:     Effort: No respiratory distress.     Breath sounds: No wheezing, rhonchi or rales.  Musculoskeletal:     Comments: Cervical spine palpated was nontender to palpation, patient is moving her upper and lower extremities out difficulty.  Skin:    General: Skin is warm and dry.  Neurological:     Mental Status: She is alert.     GCS: GCS eye subscore is 4. GCS verbal subscore is 5. GCS motor subscore is 6.     Cranial Nerves: Cranial nerves 2-12 are intact. No cranial nerve deficit.     Motor: No weakness.     Coordination: Romberg sign negative. Finger-Nose-Finger Test normal.     Comments: No facial asymmetry, patient does have slight stutter but this appears to be her baseline, there is no slurring of her words, no unilateral weakness, able to follow two-step commands.  Psychiatric:        Mood and Affect: Mood normal.     ED Results / Procedures / Treatments   Labs (all labs ordered are listed, but only abnormal results are displayed) Labs Reviewed - No data to display  EKG None  Radiology CT Head Wo Contrast  Result Date: 08/07/2022 CLINICAL DATA:  Status Campos fall 1 month ago with persistent left-sided headache. EXAM: CT HEAD WITHOUT CONTRAST TECHNIQUE: Contiguous axial images were obtained from the base of the skull through the vertex without intravenous contrast. RADIATION DOSE REDUCTION: This exam was performed according to the departmental dose-optimization program which includes automated exposure control, adjustment of the mA and/or kV according to patient size and/or  use of iterative reconstruction technique. COMPARISON:  August 31, 2021 FINDINGS: Brain: No evidence of acute infarction, hemorrhage, hydrocephalus, extra-axial collection or mass lesion/mass effect. Vascular: No hyperdense vessel or unexpected calcification. Skull: Normal. Negative for fracture or focal lesion. Sinuses/Orbits: No acute finding. Other: None. IMPRESSION: Normal head CT. Electronically Signed   By: Aram Candela M.D.   On: 08/07/2022 19:45    Procedures Procedures    Medications Ordered in ED Medications - No data to display  ED Course/ Medical Decision Making/ A&P                             Medical Decision Making  This patient presents to the ED for concern of  headache, this involves an extensive number of treatment options, and is a complaint that carries with it a high risk of complications and morbidity.  The differential diagnosis includes cranial bleed, CVA, dissection, aneurysm    Additional history obtained:  Additional history obtained from N/A  External records from outside source obtained and reviewed including neurology notes   Co morbidities that complicate the patient evaluation  PSP  Social Determinants of Health:  N/A    Lab Tests:  I Ordered, and personally interpreted labs.  The pertinent results include: N/A   Imaging Studies ordered:  I ordered imaging studies including CT head I independently visualized and interpreted imaging which showed negative acute findings I agree with the radiologist interpretation   Cardiac Monitoring:  The patient was maintained on a cardiac monitor.  I personally viewed and interpreted the cardiac monitored which showed an underlying rhythm of: N/A   Medicines ordered and prescription drug management:  I ordered medication including N/A I have reviewed the patients home medicines and have made adjustments as needed  Critical Interventions:  N/A   Reevaluation:  Presents with a headache  which has spontaneously resolved, had a CT head ordered up in triage which was negative, she had a benign physical exam, she is currently has no complaints and is in agreement with discharge at this time.  Consultations Obtained:  I requested consultation with the N/A,  and discussed lab and imaging findings as well as pertinent plan - they recommend: N/A    Test Considered:  N/A    Rule out low suspicion for internal head bleed and or mass as CT imaging is negative for acute findings.  Low suspicion for CVA she has no focal deficit present my exam.  Low suspicion for dissection of the vertebral or carotid artery as presentation atypical of etiology.  Low suspicion for meningitis as she has no meningeal sign present.     Dispostion and problem list  After consideration of the diagnostic results and the patients response to treatment, I feel that the patent would benefit from discharge.  Headache-since resolved possible tension-like headache will have her follow-up with her neurologist for further evaluation short-term precautions.            Final Clinical Impression(s) / ED Diagnoses Final diagnoses:  Nonintractable headache, unspecified chronicity pattern, unspecified headache type    Rx / DC Orders ED Discharge Orders     None         Carroll Sage, PA-C 08/08/22 0007    Glynn Octave, MD 08/08/22 [phone]

## 2022-08-07 NOTE — ED Triage Notes (Signed)
Pt states she fell a month ago and hit L side head on bathroom floor; pt evaluated by EMS after fall but was not transported to ED; pt states she is still having head pain; described as aching from L face to posterior head; no new neuro deficits

## 2022-08-07 NOTE — ED Provider Notes (Incomplete)
[place]  EMERGENCY DEPARTMENT AT [facility] Provider Note   CSN: [id number] Arrival date & time: 08/07/22  1652     History {Add pertinent medical, surgical, social history, OB history to HPI:1} No chief complaint on file.   Kristi Campos is a 72 y.o. female.  HPI   Patient with medical history including hypertension, GERD, diabetes, PSP, presenting with complaints of left-sided headache.  Patient states that headaches are about a week ago, states it comes and goes, states that left-sided head, no associated change in vision, paresthesias or weakness upper lower extremities, nonnursing neck pain, currently has no pain at this time.  She states that she is here because she had a fall 1 month ago where she did hit her head, but never got evaluated, she afraid that something could be broken, she states that she is not having falls where she hit her head recently.  Reviewed patient's chart followed by [facility] neurology, as PSP with intermittent vision changes, chronic fatigue, dizziness.  Home Medications Prior to Admission medications   Medication Sig Start Date End Date Taking? 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    [provider]  atorvastatin (LIPITOR) 10 MG tablet Take 1 tablet (10 mg total) by mouth daily. 12/06/19   Anders Simmonds, PA-C  Blood Glucose Monitoring Suppl (ACCU-CHEK GUIDE) w/Device KIT Use to check blood sugar TID. Dx E11.69 10/30/21   Marcine Matar, MD  carbidopa-levodopa (SINEMET IR) 25-100 MG tablet TAKE 1 TABLET BY MOUTH THREE TIMES DAILY 05/17/22   Tat, Octaviano Batty, DO  Continuous Blood Gluc Receiver (FREESTYLE LIBRE READER) DEVI UAD 10/01/21   Marcine Matar, MD  Continuous Blood Gluc Sensor (FREESTYLE LIBRE SENSOR SYSTEM) MISC Change sensor Q 2 wks 10/01/21   Marcine Matar,  MD  docusate sodium (COLACE) 100 MG capsule Take 1 capsule (100 mg total) by mouth every 12 (twelve) hours. Patient not taking: Reported on 06/24/2022 10/14/21   Roxy Horseman, PA-C  glucose blood (ACCU-CHEK GUIDE) test strip USE AS DIRECTED THREE TIMES DAILY 07/05/22   Marcine Matar, MD  insulin glargine (LANTUS SOLOSTAR) 100 UNIT/ML Solostar Pen Inject 15 Units into the skin daily. 10/22/21   Marcine Matar, MD  Insulin Pen Needle (PEN NEEDLES) 31G X 8 MM MISC UAD 10/01/21   Marcine Matar, MD  JARDIANCE 25 MG TABS tablet TAKE 1 TABLET(25 MG) BY MOUTH DAILY 06/01/22   Marcine Matar, MD  losartan-hydrochlorothiazide [facility]) 100-25 MG tablet TAKE 1/2 TABLET BY MOUTH DAILY 05/17/22   Marcine Matar, MD  nitroGLYCERIN (NITROSTAT) 0.4 MG SL tablet Place 1 tablet (0.4 mg total) under the tongue every 5 (five) minutes as needed for chest pain. 10/18/20   Osvaldo Shipper, MD  pantoprazole (PROTONIX) 40 MG tablet TAKE 1 TABLET BY MOUTH DAILY AT NOON 04/22/22   Marcine Matar, MD  Semaglutide,0.25 or 0.5MG /DOS, (OZEMPIC, 0.25 OR 0.5 MG/DOSE,) 2 MG/3ML SOPN Inject 0.5 mg into the skin once a week. 02/19/22   Marcine Matar, MD  traMADol (ULTRAM) 50 MG tablet Take 1 tablet (50 mg total) by mouth every 6 (six) hours as needed. 11/26/21 11/26/22  Lars Mage, PA-C  simvastatin (ZOCOR) 10 MG tablet Take 10 mg by mouth at bedtime. 01/01/19 02/28/20  [provider]  Allergies    Metformin and related    Review of Systems   Review of Systems  Constitutional:  Negative for chills and fever.  Respiratory:  Negative for shortness of breath.   Cardiovascular:  Negative for chest pain.  Gastrointestinal:  Negative for abdominal pain.  Neurological:  Negative for headaches.    Physical Exam Updated Vital Signs BP (!) 156/89 (BP Location: Left Arm)   Pulse 66   Temp 97.8 F (36.6 C)   Resp 17   SpO2 100%  Physical Exam Vitals and nursing note reviewed.   Constitutional:      General: She is not in acute distress.    Appearance: She is not ill-appearing.  HENT:     Head: Normocephalic and atraumatic.     Comments: No deformity of the head present no raccoon eyes or Battle sign noted.    Nose: No congestion.  Eyes:     Conjunctiva/sclera: Conjunctivae normal.  Cardiovascular:     Rate and Rhythm: Normal rate and regular rhythm.     Pulses: Normal pulses.     Heart sounds: No murmur heard.    No friction rub. No gallop.  Pulmonary:     Effort: No respiratory distress.     Breath sounds: No wheezing, rhonchi or rales.  Musculoskeletal:     Comments: Cervical spine palpated was nontender to palpation, patient is moving her upper and lower extremities out difficulty.  Skin:    General: Skin is warm and dry.  Neurological:     Mental Status: She is alert.     GCS: GCS eye subscore is 4. GCS verbal subscore is 5. GCS motor subscore is 6.     Cranial Nerves: Cranial nerves 2-12 are intact. No cranial nerve deficit.     Motor: No weakness.     Coordination: Romberg sign negative. Finger-Nose-Finger Test normal.     Comments: No facial asymmetry, patient does have slight stutter but this appears to be her baseline, there is no slurring of her words, no unilateral weakness, able to follow two-step commands.  Psychiatric:        Mood and Affect: Mood normal.     ED Results / Procedures / Treatments   Labs (all labs ordered are listed, but only abnormal results are displayed) Labs Reviewed - No data to display  EKG None  Radiology CT Head Wo Contrast  Result Date: 08/07/2022 CLINICAL DATA:  Status post fall 1 month ago with persistent left-sided headache. EXAM: CT HEAD WITHOUT CONTRAST TECHNIQUE: Contiguous axial images were obtained from the base of the skull through the vertex without intravenous contrast. RADIATION DOSE REDUCTION: This exam was performed according to the departmental dose-optimization program which includes  automated exposure control, adjustment of the mA and/or kV according to patient size and/or use of iterative reconstruction technique. COMPARISON:  August 31, 2021 FINDINGS: Brain: No evidence of acute infarction, hemorrhage, hydrocephalus, extra-axial collection or mass lesion/mass effect. Vascular: No hyperdense vessel or unexpected calcification. Skull: Normal. Negative for fracture or focal lesion. Sinuses/Orbits: No acute finding. Other: None. IMPRESSION: Normal head CT. Electronically Signed   By: Aram Candela M.D.   On: 08/07/2022 19:45    Procedures Procedures  {Document cardiac monitor, telemetry assessment procedure when appropriate:1}  Medications Ordered in ED Medications - No data to display  ED Course/ Medical Decision Making/ A&P   {   Click here for ABCD2, HEART and other calculatorsREFRESH Note before signing :1}  Medical Decision Making  This patient presents to the ED for concern of headache, this involves an extensive number of treatment options, and is a complaint that carries with it a high risk of complications and morbidity.  The differential diagnosis includes cranial bleed, CVA, dissection, aneurysm    Additional history obtained:  Additional history obtained from N/A  External records from outside source obtained and reviewed including neurology notes   Co morbidities that complicate the patient evaluation  PSP  Social Determinants of Health:  N/A    Lab Tests:  I Ordered, and personally interpreted labs.  The pertinent results include: N/A   Imaging Studies ordered:  I ordered imaging studies including CT head I independently visualized and interpreted imaging which showed negative acute findings I agree with the radiologist interpretation   Cardiac Monitoring:  The patient was maintained on a cardiac monitor.  I personally viewed and interpreted the cardiac monitored which showed an underlying rhythm of:  N/A   Medicines ordered and prescription drug management:  I ordered medication including N/A I have reviewed the patients home medicines and have made adjustments as needed  Critical Interventions:  N/A   Reevaluation:  Presents with a headache which has spontaneously resolved, had a CT head ordered up in triage which was negative, she had a benign physical exam, she is currently has no complaints and is in agreement with discharge at this time.  Consultations Obtained:  I requested consultation with the ***,  and discussed lab and imaging findings as well as pertinent plan - they recommend: ***    Test Considered:  ***    Rule out ****    Dispostion and problem list  After consideration of the diagnostic results and the patients response to treatment, I feel that the patent would benefit from ***.       {Document critical care time when appropriate:1} {Document review of labs and clinical decision tools ie heart score, Chads2Vasc2 etc:1}  {Document your independent review of radiology images, and any outside records:1} {Document your discussion with family members, caretakers, and with consultants:1} {Document social determinants of health affecting pt's care:1} {Document your decision making why or why not admission, treatments were needed:1} Final Clinical Impression(s) / ED Diagnoses Final diagnoses:  None    Rx / DC Orders ED Discharge Orders     None

## 2022-08-08 NOTE — Discharge Instructions (Signed)
Imaging was reassuring, please continue with all your home medications.  May use over-the-counter pain medication as needed if headache occurs.  Recommend staying hydrated get plenty of sleep and decrease in stress  Follow-up with your neurologist as needed  Come back to the emergency department if you develop chest pain, shortness of breath, severe abdominal pain, uncontrolled nausea, vomiting, diarrhea.

## 2022-08-09 DIAGNOSIS — E1152 Type 2 diabetes mellitus with diabetic peripheral angiopathy with gangrene: Secondary | ICD-10-CM | POA: Diagnosis not present

## 2022-08-09 DIAGNOSIS — E1159 Type 2 diabetes mellitus with other circulatory complications: Secondary | ICD-10-CM | POA: Diagnosis not present

## 2022-08-09 DIAGNOSIS — E1122 Type 2 diabetes mellitus with diabetic chronic kidney disease: Secondary | ICD-10-CM | POA: Diagnosis not present

## 2022-08-09 DIAGNOSIS — N1831 Chronic kidney disease, stage 3a: Secondary | ICD-10-CM | POA: Diagnosis not present

## 2022-08-09 DIAGNOSIS — G231 Progressive supranuclear ophthalmoplegia [Steele-Richardson-Olszewski]: Secondary | ICD-10-CM | POA: Diagnosis not present

## 2022-08-09 DIAGNOSIS — I152 Hypertension secondary to endocrine disorders: Secondary | ICD-10-CM | POA: Diagnosis not present

## 2022-08-10 ENCOUNTER — Other Ambulatory Visit: Payer: Self-pay

## 2022-08-10 ENCOUNTER — Telehealth: Payer: Self-pay | Admitting: Neurology

## 2022-08-10 DIAGNOSIS — R519 Headache, unspecified: Secondary | ICD-10-CM

## 2022-08-10 MED ORDER — TIZANIDINE HCL 2 MG PO CAPS: 2.0000 mg | ORAL_CAPSULE | Freq: Every day | ORAL | 0 refills | Status: DC

## 2022-08-10 NOTE — Telephone Encounter (Signed)
Patient having headache every other day , she didn't specify to her daughter sharp or dull pain just that it is uncomfortable and it hurts ,no nausea or vomiting , she takes Tylenol and says it works sometimes

## 2022-08-10 NOTE — Telephone Encounter (Signed)
Patient agreed to try the medication

## 2022-08-10 NOTE — Telephone Encounter (Signed)
Pt daughter is wanting to speak with someone patient fell about a month ago and hit her head on the shower wall and is having headaches now

## 2022-08-10 NOTE — Telephone Encounter (Signed)
We could offer her a low dose of a muscle relaxant (tizanidine 2mg  qhs prn) to see if this help.  Side effect include sleepiness and lightheadedness, but usually at higher dosage.

## 2022-08-10 NOTE — Telephone Encounter (Signed)
Patient was evaluated at  hospital with a CT wo showing no issues at that time. I did let patient know to reach out to PCP while Dr. Arbutus Leas is out of the office to see if they can facilitate evaluating this patient. I also told patients daughter I would send to Dr. Iona Beard collogues to see if they have any advice at this time

## 2022-08-10 NOTE — Telephone Encounter (Signed)
ER notes reviewed.  CT head did not show any worrisome findings.  Notes indicated that headache had resolved. Can you find out more information about her headache - how often is she getting it?  Dull/sharp pain? Any nausea/vomiting? What has she tried?

## 2022-08-15 ENCOUNTER — Other Ambulatory Visit: Payer: Self-pay | Admitting: Internal Medicine

## 2022-08-15 DIAGNOSIS — I152 Hypertension secondary to endocrine disorders: Secondary | ICD-10-CM

## 2022-08-30 ENCOUNTER — Other Ambulatory Visit: Payer: Self-pay | Admitting: Internal Medicine

## 2022-08-30 ENCOUNTER — Telehealth: Payer: Self-pay | Admitting: Neurology

## 2022-08-30 DIAGNOSIS — E1169 Type 2 diabetes mellitus with other specified complication: Secondary | ICD-10-CM

## 2022-08-30 NOTE — Telephone Encounter (Signed)
Sent to [employer]

## 2022-08-30 NOTE — Telephone Encounter (Signed)
Left message with the after hour service on 08-30-22 at 12:42 pm   Caller states that [facility] needs information ( records ) sent for a wheelchair order

## 2022-08-31 NOTE — Telephone Encounter (Signed)
Carolin Coy, Debbie Micanopy, [place], [place] No problem. Looks like we are working on this. I will follow up on it!

## 2022-08-31 NOTE — Telephone Encounter (Signed)
Called daughter to update and left message about chair

## 2022-08-31 NOTE — Telephone Encounter (Signed)
Patients daughter is returning a call to [employer]

## 2022-09-08 DIAGNOSIS — H00024 Hordeolum internum left upper eyelid: Secondary | ICD-10-CM | POA: Diagnosis not present

## 2022-09-21 ENCOUNTER — Other Ambulatory Visit: Payer: Self-pay

## 2022-09-21 ENCOUNTER — Telehealth: Payer: Self-pay | Admitting: Neurology

## 2022-09-21 DIAGNOSIS — R519 Headache, unspecified: Secondary | ICD-10-CM

## 2022-09-21 MED ORDER — TIZANIDINE HCL 2 MG PO TABS: 2.0000 mg | ORAL_TABLET | Freq: Every day | ORAL | 0 refills | Status: DC

## 2022-09-21 NOTE — Telephone Encounter (Signed)
Called daughter sent in medication Tizanidine  If she would like to try this, ok to send tizandine 2mg  1 tab at bedtime as needed for headache. #30, 0 refills.

## 2022-09-21 NOTE — Telephone Encounter (Signed)
Patient's daughter is calling about a mediation that was suppose to be sent in for Patient's headaches. The daughter isnt sure the prescriptions name.Evlyn Clines

## 2022-10-02 ENCOUNTER — Other Ambulatory Visit: Payer: Self-pay | Admitting: Internal Medicine

## 2022-10-02 DIAGNOSIS — E1169 Type 2 diabetes mellitus with other specified complication: Secondary | ICD-10-CM

## 2022-10-04 ENCOUNTER — Ambulatory Visit: Payer: 59 | Attending: Family Medicine | Admitting: Family Medicine

## 2022-10-04 ENCOUNTER — Encounter: Payer: Self-pay | Admitting: Family Medicine

## 2022-10-04 VITALS — BP 113/78 | HR 82 | Ht 63.0 in | Wt 192.4 lb

## 2022-10-04 DIAGNOSIS — R109 Unspecified abdominal pain: Secondary | ICD-10-CM | POA: Diagnosis not present

## 2022-10-04 LAB — POCT URINALYSIS DIP (CLINITEK)
Bilirubin, UA: NEGATIVE
Blood, UA: NEGATIVE
Glucose, UA: 500 mg/dL — AB
Ketones, POC UA: NEGATIVE mg/dL
Leukocytes, UA: NEGATIVE
Nitrite, UA: NEGATIVE
POC PROTEIN,UA: NEGATIVE
Spec Grav, UA: 1.015 (ref 1.010–1.025)
Urobilinogen, UA: 0.2 E.U./dL
pH, UA: 6 (ref 5.0–8.0)

## 2022-10-04 MED ORDER — DICLOFENAC SODIUM 1 % EX GEL: 4.0000 g | Freq: Two times a day (BID) | CUTANEOUS | 1 refills | Status: AC | PRN

## 2022-10-04 MED ORDER — LIDOCAINE 5 % EX PTCH: 1.0000 | MEDICATED_PATCH | CUTANEOUS | 1 refills | Status: AC

## 2022-10-04 NOTE — Patient Instructions (Signed)
Acute Back Pain, Adult Acute back pain is sudden and usually short-lived. It is often caused by an injury to the muscles and tissues in the back. The injury may result from: A muscle, tendon, or ligament getting overstretched or torn. Ligaments are tissues that connect bones to each other. Lifting something improperly can cause a back strain. Wear and tear (degeneration) of the spinal disks. Spinal disks are circular tissue that provide cushioning between the bones of the spine (vertebrae). Twisting motions, such as while playing sports or doing yard work. A hit to the back. Arthritis. You may have a physical exam, lab tests, and imaging tests to find the cause of your pain. Acute back pain usually goes away with rest and home care. Follow these instructions at home: Managing pain, stiffness, and swelling Take over-the-counter and prescription medicines only as told by your health care provider. Treatment may include medicines for pain and inflammation that are taken by mouth or applied to the skin, or muscle relaxants. Your health care provider may recommend applying ice during the first 24-48 hours after your pain starts. To do this: Put ice in a plastic bag. Place a towel between your skin and the bag. Leave the ice on for 20 minutes, 2-3 times a day. Remove the ice if your skin turns bright red. This is very important. If you cannot feel pain, heat, or cold, you have a greater risk of damage to the area. If directed, apply heat to the affected area as often as told by your health care provider. Use the heat source that your health care provider recommends, such as a moist heat pack or a heating pad. Place a towel between your skin and the heat source. Leave the heat on for 20-30 minutes. Remove the heat if your skin turns bright red. This is especially important if you are unable to feel pain, heat, or cold. You have a greater risk of getting burned. Activity  Do not stay in bed. Staying in  bed for more than 1-2 days can delay your recovery. Sit up and stand up straight. Avoid leaning forward when you sit or hunching over when you stand. If you work at a desk, sit close to it so you do not need to lean over. Keep your chin tucked in. Keep your neck drawn back, and keep your elbows bent at a 90-degree angle (right angle). Sit high and close to the steering wheel when you drive. Add lower back (lumbar) support to your car seat, if needed. Take short walks on even surfaces as soon as you are able. Try to increase the length of time you walk each day. Do not sit, drive, or stand in one place for more than 30 minutes at a time. Sitting or standing for long periods of time can put stress on your back. Do not drive or use heavy machinery while taking prescription pain medicine. Use proper lifting techniques. When you bend and lift, use positions that put less stress on your back: Bend your knees. Keep the load close to your body. Avoid twisting. Exercise regularly as told by your health care provider. Exercising helps your back heal faster and helps prevent back injuries by keeping muscles strong and flexible. Work with a physical therapist to make a safe exercise program, as recommended by your health care provider. Do any exercises as told by your physical therapist. Lifestyle Maintain a healthy weight. Extra weight puts stress on your back and makes it difficult to have good   posture. Avoid activities or situations that make you feel anxious or stressed. Stress and anxiety increase muscle tension and can make back pain worse. Learn ways to manage anxiety and stress, such as through exercise. General instructions Sleep on a firm mattress in a comfortable position. Try lying on your side with your knees slightly bent. If you lie on your back, put a pillow under your knees. Keep your head and neck in a straight line with your spine (neutral position) when using electronic equipment like  smartphones or pads. To do this: Raise your smartphone or pad to look at it instead of bending your head or neck to look down. Put the smartphone or pad at the level of your face while looking at the screen. Follow your treatment plan as told by your health care provider. This may include: Cognitive or behavioral therapy. Acupuncture or massage therapy. Meditation or yoga. Contact a health care provider if: You have pain that is not relieved with rest or medicine. You have increasing pain going down into your legs or buttocks. Your pain does not improve after 2 weeks. You have pain at night. You lose weight without trying. You have a fever or chills. You develop nausea or vomiting. You develop abdominal pain. Get help right away if: You develop new bowel or bladder control problems. You have unusual weakness or numbness in your arms or legs. You feel faint. These symptoms may represent a serious problem that is an emergency. Do not wait to see if the symptoms will go away. Get medical help right away. Call your local emergency services (911 in the [place] Do not drive yourself to the hospital. Summary Acute back pain is sudden and usually short-lived. Use proper lifting techniques. When you bend and lift, use positions that put less stress on your back. Take over-the-counter and prescription medicines only as told by your health care provider, and apply heat or ice as told. This information is not intended to replace advice given to you by your health care provider. Make sure you discuss any questions you have with your health care provider. Document Revised: 05/09/2020 Document Reviewed: 05/09/2020 Elsevier Patient Education  2024 Elsevier Inc.  

## 2022-10-04 NOTE — Telephone Encounter (Signed)
Requested Prescriptions  Pending Prescriptions Disp Refills   Insulin Pen Needle (B-D ULTRAFINE III SHORT PEN) 31G X 8 MM MISC [Pharmacy Med Name: B-D PEN NDL SHRT 31GX8MM(5/16) BLU] 100 each 2    Sig: USE AS DIRECTED     Endocrinology: Diabetes - Testing Supplies Passed - 10/02/2022  6:58 AM      Passed - Valid encounter within last 12 months    Recent Outpatient Visits           3 months ago Type 2 diabetes mellitus with obesity [facility])   [place] [facility] Marcine Matar, MD   10 months ago Jaw claudication   [facility] Jonah Blue B, MD   10 months ago Diabetes mellitus type 2 in obese [facility])   [facility] [place], Cornelius Moras, RPH-CPP   11 months ago Fatigue, unspecified type   [facility] Marcine Matar, MD   1 year ago Recurrent falls   [place] [facility] Marcine Matar, MD       Future Appointments             In 4 weeks Marcine Matar, MD [facility]

## 2022-10-04 NOTE — Progress Notes (Signed)
Subjective:  Patient ID: Kristi Campos, female    DOB: 06-Oct-1950  Age: 72 y.o. MRN: [id number]  CC: Flank Pain (Right side pain for 2 days)   HPI Kenlee Blacketer is a 71 y.o. year old female patient of Dr. Laural Benes with a history of type 2 diabetes mellitus, osteoarthritis, stage III CKD, hyperlipidemia.  Interval History: Discussed the use of AI scribe software for clinical note transcription with the patient, who gave verbal consent to proceed.   She presents with a two-day history of right-sided back pain, which she initially suspected to be kidney-related. The pain, rated as a 3/10, is exacerbated by movement. She denies any associated nausea, vomiting, or urinary symptoms such as dysuria or increased frequency. She has not experienced similar pain in the past and denies any fever. The patient has been managing the pain with Tylenol, which she reports to be effective in alleviating the discomfort.        Past Medical History:  Diagnosis Date   Diabetes mellitus without complication (HCC)    Fibroid uterus    GERD (gastroesophageal reflux disease)    Hypertension    Vaginal Pap smear, abnormal     Past Surgical History:  Procedure Laterality Date   ARTERY BIOPSY Left 11/26/2021   Procedure: BIOPSY TEMPORAL ARTERY;  Surgeon: Leonie Douglas, MD;  Location: [facility] OR;  Service: Vascular;  Laterality: Left;   COLONOSCOPY     I & D EXTREMITY Right 08/09/2013   Procedure: MINOR IRRIGATION AND DEBRIDEMENT EXTREMITY;  Surgeon: Wyn Forster, MD;  Location: [facility];  Service: Orthopedics;  Laterality: Right;  long       wound class 4    Family History  Problem Relation Age of Onset   Bipolar disorder Mother    Hypertension Sister    Hypertension Sister    Hypertension Sister    Hypertension Sister    Hypertension Sister    Hypertension Sister    Hypertension Daughter    Diabetes Son    Prostate cancer Son     Social History   Socioeconomic History    Marital status: Widowed    Spouse name: Not on file   Number of children: 4   Years of education: Not on file   Highest education level: Not on file  Occupational History    Employer: other   Occupation: retired    Comment: weave for living  Tobacco Use   Smoking status: Never   Smokeless tobacco: Never  Vaping Use   Vaping status: Never Used  Substance and Sexual Activity   Alcohol use: No    Alcohol/week: 0.0 standard drinks of alcohol   Drug use: No   Sexual activity: Not Currently    Partners: Male    Birth control/protection: Post-menopausal  Other Topics Concern   Not on file  Social History Narrative   Right handed    Lives at home alone ONE LEVEL   Caffeine1 cup daily   Social Determinants of Health   Financial Resource Strain: Low Risk  (04/29/2022)   Overall Financial Resource Strain (CARDIA)    Difficulty of Paying Living Expenses: Not hard at all  Food Insecurity: No Food Insecurity (04/29/2022)   Hunger Vital Sign    Worried About Running Out of Food in the Last Year: Never true    Ran Out of Food in the Last Year: Never true  Transportation Needs: No Transportation Needs (04/29/2022)   PRAPARE - Transportation  Lack of Transportation (Medical): No    Lack of Transportation (Non-Medical): No  Physical Activity: Inactive (04/29/2022)   Exercise Vital Sign    Days of Exercise per Week: 0 days    Minutes of Exercise per Session: 0 min  Stress: No Stress Concern Present (04/29/2022)   [employer] of Occupational Health - Occupational Stress Questionnaire    Feeling of Stress : Not at all  Social Connections: Not on file    Allergies  Allergen Reactions   Metformin And Related Diarrhea    Outpatient Medications Prior to Visit  Medication Sig Dispense Refill   AMBULATORY NON FORMULARY MEDICATION Please dispense 1 power wheelchair 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     atorvastatin (LIPITOR)  10 MG tablet Take 1 tablet (10 mg total) by mouth daily. 90 tablet 1   Blood Glucose Monitoring Suppl (ACCU-CHEK GUIDE) w/Device KIT Use to check blood sugar TID. Dx E11.69 1 kit 0   carbidopa-levodopa (SINEMET IR) 25-100 MG tablet TAKE 1 TABLET BY MOUTH THREE TIMES DAILY 270 tablet 1   Continuous Blood Gluc Receiver (FREESTYLE LIBRE READER) DEVI UAD 1 each 0   Continuous Blood Gluc Sensor (FREESTYLE LIBRE SENSOR SYSTEM) MISC Change sensor Q 2 wks 2 each 12   docusate sodium (COLACE) 100 MG capsule Take 1 capsule (100 mg total) by mouth every 12 (twelve) hours. 30 capsule 0   glucose blood (ACCU-CHEK GUIDE) test strip USE AS DIRECTED THREE TIMES DAILY 100 strip 2   insulin glargine (LANTUS SOLOSTAR) 100 UNIT/ML Solostar Pen Inject 15 Units into the skin daily. 15 mL PRN   Insulin Pen Needle (B-D ULTRAFINE III SHORT PEN) 31G X 8 MM MISC USE AS DIRECTED 100 each 2   JARDIANCE 25 MG TABS tablet TAKE 1 TABLET(25 MG) BY MOUTH DAILY 90 tablet 0   losartan-hydrochlorothiazide (HYZAAR) 100-25 MG tablet TAKE 1/2 TABLET BY MOUTH DAILY 45 tablet 0   nitroGLYCERIN (NITROSTAT) 0.4 MG SL tablet Place 1 tablet (0.4 mg total) under the tongue every 5 (five) minutes as needed for chest pain. 20 tablet 0   pantoprazole (PROTONIX) 40 MG tablet TAKE 1 TABLET BY MOUTH DAILY AT NOON 90 tablet 2   Semaglutide,0.25 or 0.5MG /DOS, (OZEMPIC, 0.25 OR 0.5 MG/DOSE,) 2 MG/3ML SOPN Inject 0.5 mg into the skin once a week. 3 mL 2   tiZANidine (ZANAFLEX) 2 MG tablet Take 1 tablet (2 mg total) by mouth at bedtime. 30 tablet 0   traMADol (ULTRAM) 50 MG tablet Take 1 tablet (50 mg total) by mouth every 6 (six) hours as needed. 10 tablet 0   No facility-administered medications prior to visit.     ROS Review of Systems  Constitutional:  Negative for activity change and appetite change.  HENT:  Negative for sinus pressure and sore throat.   Respiratory:  Negative for chest tightness, shortness of breath and wheezing.    Cardiovascular:  Negative for chest pain and palpitations.  Gastrointestinal:  Negative for abdominal distention, abdominal pain and constipation.  Genitourinary:  Positive for flank pain.  Psychiatric/Behavioral:  Negative for behavioral problems and dysphoric mood.     Objective:  BP 113/78   Pulse 82   Ht 5\' 3"  (1.6 m)   Wt 192 lb 6.4 oz (87.3 kg)   SpO2 98%   BMI 34.08 kg/m      10/04/2022    3:37 PM 08/08/2022   12:56  AM 08/08/2022   12:27 AM  BP/Weight  Systolic BP 113 197 145  Diastolic BP 78 97 88  Wt. (Lbs) 192.4    BMI 34.08 kg/m2        Physical Exam Constitutional:      Appearance: She is well-developed.  Cardiovascular:     Rate and Rhythm: Normal rate.     Heart sounds: Normal heart sounds. No murmur heard. Pulmonary:     Effort: Pulmonary effort is normal.     Breath sounds: Normal breath sounds. No wheezing or rales.  Chest:     Chest wall: No tenderness.  Abdominal:     General: Bowel sounds are normal. There is no distension.     Palpations: Abdomen is soft. There is no mass.     Tenderness: There is no abdominal tenderness. There is no right CVA tenderness or left CVA tenderness.  Musculoskeletal:        General: No tenderness. Normal range of motion.     Right lower leg: No edema.     Left lower leg: No edema.     Comments: Negative straight leg raise bilaterally  Neurological:     Mental Status: She is alert and oriented to person, place, and time.  Psychiatric:        Mood and Affect: Mood normal.        Latest Ref Rng & Units 04/21/2022    1:29 PM 11/26/2021    7:15 AM 11/23/2021   11:18 AM  CMP  Glucose 70 - 99 mg/dL 161  096  98   BUN 8 - 23 mg/dL 17  26  17    Creatinine 0.44 - 1.00 mg/dL 0.45  4.09  8.11   Sodium 135 - 145 mmol/L 138  142  144   Potassium 3.5 - 5.1 mmol/L 3.9  3.7  5.0   Chloride 98 - 111 mmol/L 104  105  100   CO2 22 - 32 mmol/L 25   24   Calcium 8.9 - 10.3 mg/dL 9.1   91.4   Total Protein 6.5 - 8.1 g/dL 7.2      Total Bilirubin 0.3 - 1.2 mg/dL 0.8     Alkaline Phos 38 - 126 U/L 61     AST 15 - 41 U/L 17     ALT 0 - 44 U/L <5       Lipid Panel     Component Value Date/Time   CHOL 154 01/27/2021 1134   TRIG 139 01/27/2021 1134   HDL 56 01/27/2021 1134   CHOLHDL 2.8 01/27/2021 1134   LDLCALC 74 01/27/2021 1134    CBC    Component Value Date/Time   WBC 5.4 04/21/2022 1329   RBC 4.36 04/21/2022 1329   HGB 13.4 04/21/2022 1329   HGB 13.9 10/22/2021 1134   HCT 40.7 04/21/2022 1329   HCT 41.8 10/22/2021 1134   PLT 253 04/21/2022 1329   PLT 277 10/22/2021 1134   MCV 93.3 04/21/2022 1329   MCV 92 10/22/2021 1134   MCH 30.7 04/21/2022 1329   MCHC 32.9 04/21/2022 1329   RDW 14.6 04/21/2022 1329   RDW 13.9 10/22/2021 1134   LYMPHSABS 2.1 04/21/2022 1329   MONOABS 0.5 04/21/2022 1329   EOSABS 0.1 04/21/2022 1329   BASOSABS 0.0 04/21/2022 1329    Lab Results  Component Value Date   HGBA1C 8.5 (A) 06/24/2022    Assessment & Plan:      Right-sided back pain Pain for 2 days,  exacerbated by movement, no urinary symptoms.  Physical examination and urinalysis suggest muscle spasm rather than renal pathology. -UA negative for UTI but revealed glycosuria -Continue Tylenol as needed for pain. -Prescribe Lidoderm patch and Voltaren gel for topical pain relief. -Send prescriptions to [facility] on [address].           Meds ordered this encounter  Medications   lidocaine (LIDODERM) 5 %    Sig: Place 1 patch onto the skin daily. Remove & Discard patch within 12 hours or as directed by MD    Dispense:  30 patch    Refill:  1   diclofenac Sodium (VOLTAREN) 1 % GEL    Sig: Apply 4 g topically 2 (two) times daily as needed.    Dispense:  100 g    Refill:  1    Follow-up: Return for previously scheduled appointment, Medical conditions with PCP.       Hoy Register, MD, FAAFP. [facility] [place], [place] [phone]   10/04/2022, 5:16  PM

## 2022-10-05 ENCOUNTER — Other Ambulatory Visit: Payer: Self-pay

## 2022-10-06 ENCOUNTER — Other Ambulatory Visit: Payer: Self-pay

## 2022-11-01 ENCOUNTER — Other Ambulatory Visit: Payer: Self-pay | Admitting: Internal Medicine

## 2022-11-01 DIAGNOSIS — I152 Hypertension secondary to endocrine disorders: Secondary | ICD-10-CM

## 2022-11-02 ENCOUNTER — Encounter: Payer: Self-pay | Admitting: Internal Medicine

## 2022-11-02 ENCOUNTER — Ambulatory Visit: Payer: 59 | Attending: Internal Medicine | Admitting: Internal Medicine

## 2022-11-02 VITALS — BP 114/77 | HR 77 | Temp 98.3°F | Ht 63.0 in | Wt 189.0 lb

## 2022-11-02 DIAGNOSIS — E669 Obesity, unspecified: Secondary | ICD-10-CM

## 2022-11-02 DIAGNOSIS — E785 Hyperlipidemia, unspecified: Secondary | ICD-10-CM

## 2022-11-02 DIAGNOSIS — N1831 Chronic kidney disease, stage 3a: Secondary | ICD-10-CM | POA: Diagnosis not present

## 2022-11-02 DIAGNOSIS — R471 Dysarthria and anarthria: Secondary | ICD-10-CM | POA: Diagnosis not present

## 2022-11-02 DIAGNOSIS — G231 Progressive supranuclear ophthalmoplegia [Steele-Richardson-Olszewski]: Secondary | ICD-10-CM | POA: Diagnosis not present

## 2022-11-02 DIAGNOSIS — Z794 Long term (current) use of insulin: Secondary | ICD-10-CM

## 2022-11-02 DIAGNOSIS — I152 Hypertension secondary to endocrine disorders: Secondary | ICD-10-CM | POA: Diagnosis not present

## 2022-11-02 DIAGNOSIS — Z7985 Long-term (current) use of injectable non-insulin antidiabetic drugs: Secondary | ICD-10-CM | POA: Diagnosis not present

## 2022-11-02 DIAGNOSIS — Z7984 Long term (current) use of oral hypoglycemic drugs: Secondary | ICD-10-CM

## 2022-11-02 DIAGNOSIS — E1169 Type 2 diabetes mellitus with other specified complication: Secondary | ICD-10-CM

## 2022-11-02 DIAGNOSIS — E1159 Type 2 diabetes mellitus with other circulatory complications: Secondary | ICD-10-CM | POA: Diagnosis not present

## 2022-11-02 DIAGNOSIS — Z23 Encounter for immunization: Secondary | ICD-10-CM

## 2022-11-02 LAB — POCT GLYCOSYLATED HEMOGLOBIN (HGB A1C): HbA1c, POC (controlled diabetic range): 8.2 % — AB (ref 0.0–7.0)

## 2022-11-02 LAB — GLUCOSE, POCT (MANUAL RESULT ENTRY): POC Glucose: 187 mg/dL — AB (ref 70–99)

## 2022-11-02 MED ORDER — OZEMPIC (0.25 OR 0.5 MG/DOSE) 2 MG/3ML ~~LOC~~ SOPN: 0.2500 mg | PEN_INJECTOR | SUBCUTANEOUS | 3 refills | Status: DC

## 2022-11-02 MED ORDER — ATORVASTATIN CALCIUM 10 MG PO TABS: 10.0000 mg | ORAL_TABLET | Freq: Every day | ORAL | 1 refills | Status: DC

## 2022-11-02 NOTE — Patient Instructions (Signed)
Restart Ozempic at 0.25 mg once a week.  Please let me know if you develop any vomiting, abdominal pain, unable to move your bowels.  If any of these symptoms occur, please stop the medicine and give [place] a call. Continue to monitor your blood sugars. I have sent refill on atorvastatin which is the cholesterol-lowering medication. I have referred you for home speech therapy.

## 2022-11-02 NOTE — Progress Notes (Signed)
Patient ID: Kristi Campos, female    DOB: February 24, 1951  MRN: [id number]  CC: Diabetes (DM f/u. [name] excessive fatigue X 2 weeks/Yes to flu vax.)   Subjective: Kristi Campos is a 72 y.o. female who presents for chronic ds management. 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    Daughter, [name], is with her.   Did not bring meds   DM: Results for orders placed or performed in visit on 11/02/22  POCT glycosylated hemoglobin (Hb A1C)  Result Value Ref Range   Hemoglobin A1C     HbA1c POC (<> result, manual entry)     HbA1c, POC (prediabetic range)     HbA1c, POC (controlled diabetic range) 8.2 (A) 0.0 - 7.0 %  POCT glucose (manual entry)  Result Value Ref Range   POC Glucose 187 (A) 70 - 99 mg/dl  Should be on Lantus insulin 15 units daily, Jardiance 25 mg daily, and Ozempic 0.5 mg once a wk.  Daughter reports she is not taking Ozempic because she does not have needles.  Pharmacy told them needles should be in box but they were not in there.  Out x 2 mths -Checking BS manually; did not like CGM prescribed in past.  Checks BS before BF; has log with her.  Range 120-140s, One high of 187 2 days ago. No lows. -Doing good with eating habits.  Should be on soft mechanical diet.  -out of Lipitor, not sure how long  HTN/GFR 50: Currently on Losartan/HCTZ 100/25 mg 1/2 tab daily Uses wrist device and arm devices; uses the latter.  Log: 106/83, 120/83, 102/79, 119/73, 130/93 -limits salt  PSP: daughter request ST again.  Completed 3 mths ago.  Having problems getting her words out and with word finding. Has f/u with Dr. Arbutus Leas next mth. Still has falls intermittently.  No injury.  Was approved for mobility chair but has co-pay of $400-500, which daughter will pay in installments.  Would like HH aide.  Right now daughter helps with bathing and some meal preps. Patient  Active Problem List   Diagnosis Date Noted   Stage 3a chronic kidney disease (HCC) 06/24/2022   Patient has active power of attorney for health care 06/24/2022   Progressive supranuclear palsy (HCC) 01/27/2021   AKI (acute kidney injury) (HCC) 10/18/2020   Chest pain 10/17/2020   Primary osteoarthritis, right wrist 02/28/2020   Peripheral arterial disease (HCC) 10/03/2019   Essential hypertension 10/03/2019   Hyperlipidemia 10/03/2019   Diabetes mellitus type 2 in obese 11/24/2018   Chronic right-sided low back pain without sciatica 10/24/2018     Current Outpatient Medications on File Prior to Visit  Medication Sig Dispense Refill   AMBULATORY NON FORMULARY MEDICATION Please dispense 1 power wheelchair 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     Blood Glucose Monitoring Suppl (ACCU-CHEK GUIDE) w/Device KIT Use to check blood sugar TID. Dx E11.69 1 kit 0   carbidopa-levodopa (SINEMET IR) 25-100 MG tablet TAKE 1 TABLET BY MOUTH THREE TIMES DAILY 270 tablet 1   glucose blood (ACCU-CHEK GUIDE) test strip USE AS DIRECTED THREE TIMES DAILY 100 strip 2   insulin glargine (LANTUS SOLOSTAR) 100 UNIT/ML Solostar Pen Inject 15 Units into the skin daily. 15 mL PRN   Insulin Pen Needle (B-D  ULTRAFINE III SHORT PEN) 31G X 8 MM MISC USE AS DIRECTED 100 each 2   JARDIANCE 25 MG TABS tablet TAKE 1 TABLET(25 MG) BY MOUTH DAILY 90 tablet 0   lidocaine (LIDODERM) 5 % Place 1 patch onto the skin daily. Remove & Discard patch within 12 hours or as directed by MD 30 patch 1   losartan-hydrochlorothiazide (HYZAAR) 100-25 MG tablet TAKE 1/2 TABLET BY MOUTH DAILY 45 tablet 0   nitroGLYCERIN (NITROSTAT) 0.4 MG SL tablet Place 1 tablet (0.4 mg total) under the tongue every 5 (five) minutes as needed for chest pain. 20 tablet 0   pantoprazole (PROTONIX) 40 MG tablet TAKE 1 TABLET BY MOUTH DAILY AT NOON 90 tablet 2   tiZANidine (ZANAFLEX) 2 MG tablet Take 1 tablet (2  mg total) by mouth at bedtime. 30 tablet 0   traMADol (ULTRAM) 50 MG tablet Take 1 tablet (50 mg total) by mouth every 6 (six) hours as needed. 10 tablet 0   diclofenac Sodium (VOLTAREN) 1 % GEL Apply 4 g topically 2 (two) times daily as needed. (Patient not taking: Reported on 11/02/2022) 100 g 1   [DISCONTINUED] simvastatin (ZOCOR) 10 MG tablet Take 10 mg by mouth at bedtime.     No current facility-administered medications on file prior to visit.    Allergies  Allergen Reactions   Metformin And Related Diarrhea    Social History   Socioeconomic History   Marital status: Widowed    Spouse name: Not on file   Number of children: 4   Years of education: Not on file   Highest education level: Not on file  Occupational History    Employer: other   Occupation: retired    Comment: weave for living  Tobacco Use   Smoking status: Never   Smokeless tobacco: Never  Vaping Use   Vaping status: Never Used  Substance and Sexual Activity   Alcohol use: No    Alcohol/week: 0.0 standard drinks of alcohol   Drug use: No   Sexual activity: Not Currently    Partners: Male    Birth control/protection: Post-menopausal  Other Topics Concern   Not on file  Social History Narrative   Right handed    Lives at home alone ONE LEVEL   Caffeine1 cup daily   Social Determinants of Health   Financial Resource Strain: Low Risk  (04/29/2022)   Overall Financial Resource Strain (CARDIA)    Difficulty of Paying Living Expenses: Not hard at all  Food Insecurity: No Food Insecurity (04/29/2022)   Hunger Vital Sign    Worried About Running Out of Food in the Last Year: Never true    Ran Out of Food in the Last Year: Never true  Transportation Needs: No Transportation Needs (04/29/2022)   PRAPARE - Administrator, Civil Service (Medical): No    Lack of Transportation (Non-Medical): No  Physical Activity: Inactive (04/29/2022)   Exercise Vital Sign    Days of Exercise per Week: 0 days     Minutes of Exercise per Session: 0 min  Stress: No Stress Concern Present (04/29/2022)   [employer] of Occupational Health - Occupational Stress Questionnaire    Feeling of Stress : Not at all  Social Connections: Not on file  Intimate Partner Violence: Not on file    Family History  Problem Relation Age of Onset   Bipolar disorder Mother    Hypertension Sister    Hypertension Sister    Hypertension Sister  Hypertension Sister    Hypertension Sister    Hypertension Sister    Hypertension Daughter    Diabetes Son    Prostate cancer Son     Past Surgical History:  Procedure Laterality Date   ARTERY BIOPSY Left 11/26/2021   Procedure: BIOPSY TEMPORAL ARTERY;  Surgeon: Leonie Douglas, MD;  Location: [facility] OR;  Service: Vascular;  Laterality: Left;   COLONOSCOPY     I & D EXTREMITY Right 08/09/2013   Procedure: MINOR IRRIGATION AND DEBRIDEMENT EXTREMITY;  Surgeon: Wyn Forster, MD;  Location: [facility];  Service: Orthopedics;  Laterality: Right;  long       wound class 4    ROS: Review of Systems Negative except as stated above  PHYSICAL EXAM: BP 114/77 (BP Location: Left Arm, Patient Position: Sitting, Cuff Size: Normal)   Pulse 77   Temp 98.3 F (36.8 C) (Oral)   Ht 5\' 3"  (1.6 m)   Wt 189 lb (85.7 kg)   SpO2 98%   BMI 33.48 kg/m   Wt Readings from Last 3 Encounters:  11/02/22 189 lb (85.7 kg)  10/04/22 192 lb 6.4 oz (87.3 kg)  06/24/22 195 lb (88.5 kg)    Physical Exam  General appearance - alert, well appearing, and in no distress Mental status -patient answers most questions.  Her daughter helps with history. Mouth -slightly dry oral mucosa. Chest - clear to auscultation, no wheezes, rales or rhonchi, symmetric air entry Heart - normal rate, regular rhythm, normal S1, S2, no murmurs, rubs, clicks or gallops Extremities - peripheral pulses normal, no pedal edema, no clubbing or cyanosis MSK: She has her rollator Conard with  her. Diabetic Foot Exam - Simple   Simple Foot Form Diabetic Foot exam was performed with the following findings: Yes 11/02/2022 10:42 AM  Visual Inspection See comments: Yes Sensation Testing Intact to touch and monofilament testing bilaterally: Yes Pulse Check Comments Slight ingrown toenail on the left big toe.         Latest Ref Rng & Units 04/21/2022    1:29 PM 11/26/2021    7:15 AM 11/23/2021   11:18 AM  CMP  Glucose 70 - 99 mg/dL 161  096  98   BUN 8 - 23 mg/dL 17  26  17    Creatinine 0.44 - 1.00 mg/dL 0.45  4.09  8.11   Sodium 135 - 145 mmol/L 138  142  144   Potassium 3.5 - 5.1 mmol/L 3.9  3.7  5.0   Chloride 98 - 111 mmol/L 104  105  100   CO2 22 - 32 mmol/L 25   24   Calcium 8.9 - 10.3 mg/dL 9.1   91.4   Total Protein 6.5 - 8.1 g/dL 7.2     Total Bilirubin 0.3 - 1.2 mg/dL 0.8     Alkaline Phos 38 - 126 U/L 61     AST 15 - 41 U/L 17     ALT 0 - 44 U/L <5      Lipid Panel     Component Value Date/Time   CHOL 154 01/27/2021 1134   TRIG 139 01/27/2021 1134   HDL 56 01/27/2021 1134   CHOLHDL 2.8 01/27/2021 1134   LDLCALC 74 01/27/2021 1134    CBC    Component Value Date/Time   WBC 5.4 04/21/2022 1329   RBC 4.36 04/21/2022 1329   HGB 13.4 04/21/2022 1329   HGB 13.9 10/22/2021 1134   HCT 40.7 04/21/2022 1329  HCT 41.8 10/22/2021 1134   PLT 253 04/21/2022 1329   PLT 277 10/22/2021 1134   MCV 93.3 04/21/2022 1329   MCV 92 10/22/2021 1134   MCH 30.7 04/21/2022 1329   MCHC 32.9 04/21/2022 1329   RDW 14.6 04/21/2022 1329   RDW 13.9 10/22/2021 1134   LYMPHSABS 2.1 04/21/2022 1329   MONOABS 0.5 04/21/2022 1329   EOSABS 0.1 04/21/2022 1329   BASOSABS 0.0 04/21/2022 1329    ASSESSMENT AND PLAN: 1. Type 2 diabetes mellitus with obesity (HCC) Not at goal with minimal change in A1c from last visit. Continue Lantus insulin 15 units daily and Jardiance 25 mg daily.  Restart Ozempic.  Since being off it for 2 months, we will restart at the lowest dose of 0.25  mg once a week.  I went over possible side effects with both she and her daughter of the medication.  Advised that if she develops any vomiting, abdominal pain, problems moving her bowels or significant diarrhea, she should stop the medicine and come in. - POCT glycosylated hemoglobin (Hb A1C) - POCT glucose (manual entry) - Comprehensive metabolic panel - Lipid panel - Semaglutide,0.25 or 0.5MG /DOS, (OZEMPIC, 0.25 OR 0.5 MG/DOSE,) 2 MG/3ML SOPN; Inject 0.25 mg into the skin once a week.  Dispense: 3 mL; Refill: 3  2. Hypertension associated with diabetes (HCC) At goal.  Continue Cozaar/HCTZ  3. Hyperlipidemia associated with type 2 diabetes mellitus (HCC) Restart atorvastatin which she has been out of. - atorvastatin (LIPITOR) 10 MG tablet; Take 1 tablet (10 mg total) by mouth daily.  Dispense: 90 tablet; Refill: 1  4. Stage 3a chronic kidney disease (HCC) Will continue to monitor GFR.  Avoid NSAIDs.  5. Progressive supranuclear palsy [facility]) Referral submitted for home speech therapy.  Message sent to our caseworker to determine whether her Medicare would pay for home health aide to assist with ADLs. - Ambulatory referral to Home Health  6. Dysarthria - Ambulatory referral to Home Health  7. Need for influenza vaccination Will have her come back in about 2 weeks for a nurse only visit to get the high-dose flu shot which we do not have currently.    Patient was given the opportunity to ask questions.  Patient verbalized understanding of the plan and was able to repeat key elements of the plan.   This documentation was completed using Paediatric nurse.  Any transcriptional errors are unintentional.  Orders Placed This Encounter  Procedures   Comprehensive metabolic panel   Lipid panel   Ambulatory referral to Home Health   POCT glycosylated hemoglobin (Hb A1C)   POCT glucose (manual entry)     Requested Prescriptions   Signed Prescriptions Disp Refills    Semaglutide,0.25 or 0.5MG /DOS, (OZEMPIC, 0.25 OR 0.5 MG/DOSE,) 2 MG/3ML SOPN 3 mL 3    Sig: Inject 0.25 mg into the skin once a week.   atorvastatin (LIPITOR) 10 MG tablet 90 tablet 1    Sig: Take 1 tablet (10 mg total) by mouth daily.    Return in about 4 months (around 03/04/2023).  Jonah Blue, MD, FACP

## 2022-11-03 ENCOUNTER — Other Ambulatory Visit: Payer: Self-pay | Admitting: Internal Medicine

## 2022-11-03 DIAGNOSIS — E1169 Type 2 diabetes mellitus with other specified complication: Secondary | ICD-10-CM

## 2022-11-03 LAB — COMPREHENSIVE METABOLIC PANEL
ALT: 9 IU/L (ref 0–32)
AST: 14 IU/L (ref 0–40)
Albumin: 4.3 g/dL (ref 3.8–4.8)
Alkaline Phosphatase: 82 IU/L (ref 44–121)
BUN/Creatinine Ratio: 15 (ref 12–28)
BUN: 18 mg/dL (ref 8–27)
Bilirubin Total: 0.4 mg/dL (ref 0.0–1.2)
CO2: 26 mmol/L (ref 20–29)
Calcium: 10 mg/dL (ref 8.7–10.3)
Chloride: 103 mmol/L (ref 96–106)
Creatinine, Ser: 1.19 mg/dL — ABNORMAL HIGH (ref 0.57–1.00)
Globulin, Total: 3.3 g/dL (ref 1.5–4.5)
Glucose: 158 mg/dL — ABNORMAL HIGH (ref 70–99)
Potassium: 4.4 mmol/L (ref 3.5–5.2)
Sodium: 144 mmol/L (ref 134–144)
Total Protein: 7.6 g/dL (ref 6.0–8.5)
eGFR: 49 mL/min/{1.73_m2} — ABNORMAL LOW (ref >59)

## 2022-11-03 LAB — LIPID PANEL
Chol/HDL Ratio: 3.5 ratio (ref 0.0–4.4)
Cholesterol, Total: 180 mg/dL (ref 100–199)
HDL: 51 mg/dL (ref >39)
LDL Chol Calc (NIH): 105 mg/dL — ABNORMAL HIGH (ref 0–99)
Triglycerides: 138 mg/dL (ref 0–149)
VLDL Cholesterol Cal: 24 mg/dL (ref 5–40)

## 2022-11-03 MED ORDER — BD PEN NEEDLE SHORT U/F 31G X 8 MM MISC: 2 refills | Status: AC

## 2022-11-04 ENCOUNTER — Telehealth: Payer: Self-pay

## 2022-11-04 NOTE — Telephone Encounter (Signed)
Call placed to patient unable to reach message left on VM. To discuss HH needs

## 2022-11-10 ENCOUNTER — Telehealth: Payer: Self-pay

## 2022-11-10 ENCOUNTER — Other Ambulatory Visit: Payer: Self-pay | Admitting: Internal Medicine

## 2022-11-10 DIAGNOSIS — E1169 Type 2 diabetes mellitus with other specified complication: Secondary | ICD-10-CM

## 2022-11-10 NOTE — Telephone Encounter (Signed)
Patient has been accepted at  [facility].  Per Christy Gentles  services to start  next week .

## 2022-11-15 ENCOUNTER — Ambulatory Visit: Payer: 59 | Attending: Internal Medicine

## 2022-11-15 DIAGNOSIS — Z23 Encounter for immunization: Secondary | ICD-10-CM | POA: Diagnosis not present

## 2022-11-15 NOTE — Progress Notes (Signed)
High dose Flu vaccine administered in right deltoid per protocols.  Information sheet given. Patient denies and pain or discomfort at injection site. Tolerated injection well no reaction.

## 2022-12-01 ENCOUNTER — Other Ambulatory Visit: Payer: Self-pay | Admitting: Internal Medicine

## 2022-12-01 DIAGNOSIS — E1169 Type 2 diabetes mellitus with other specified complication: Secondary | ICD-10-CM

## 2022-12-01 DIAGNOSIS — R1312 Dysphagia, oropharyngeal phase: Secondary | ICD-10-CM | POA: Diagnosis not present

## 2022-12-01 DIAGNOSIS — N1831 Chronic kidney disease, stage 3a: Secondary | ICD-10-CM | POA: Diagnosis not present

## 2022-12-01 DIAGNOSIS — R471 Dysarthria and anarthria: Secondary | ICD-10-CM | POA: Diagnosis not present

## 2022-12-01 DIAGNOSIS — Z7984 Long term (current) use of oral hypoglycemic drugs: Secondary | ICD-10-CM | POA: Diagnosis not present

## 2022-12-01 DIAGNOSIS — E1159 Type 2 diabetes mellitus with other circulatory complications: Secondary | ICD-10-CM | POA: Diagnosis not present

## 2022-12-01 DIAGNOSIS — M19031 Primary osteoarthritis, right wrist: Secondary | ICD-10-CM | POA: Diagnosis not present

## 2022-12-01 DIAGNOSIS — G8929 Other chronic pain: Secondary | ICD-10-CM | POA: Diagnosis not present

## 2022-12-01 DIAGNOSIS — G231 Progressive supranuclear ophthalmoplegia [Steele-Richardson-Olszewski]: Secondary | ICD-10-CM | POA: Diagnosis not present

## 2022-12-01 DIAGNOSIS — E785 Hyperlipidemia, unspecified: Secondary | ICD-10-CM | POA: Diagnosis not present

## 2022-12-01 DIAGNOSIS — Z794 Long term (current) use of insulin: Secondary | ICD-10-CM | POA: Diagnosis not present

## 2022-12-01 DIAGNOSIS — E1151 Type 2 diabetes mellitus with diabetic peripheral angiopathy without gangrene: Secondary | ICD-10-CM | POA: Diagnosis not present

## 2022-12-01 DIAGNOSIS — H409 Unspecified glaucoma: Secondary | ICD-10-CM | POA: Diagnosis not present

## 2022-12-01 DIAGNOSIS — Z9181 History of falling: Secondary | ICD-10-CM | POA: Diagnosis not present

## 2022-12-01 DIAGNOSIS — E1122 Type 2 diabetes mellitus with diabetic chronic kidney disease: Secondary | ICD-10-CM | POA: Diagnosis not present

## 2022-12-01 DIAGNOSIS — M545 Low back pain, unspecified: Secondary | ICD-10-CM | POA: Diagnosis not present

## 2022-12-01 DIAGNOSIS — Z7982 Long term (current) use of aspirin: Secondary | ICD-10-CM | POA: Diagnosis not present

## 2022-12-01 DIAGNOSIS — I152 Hypertension secondary to endocrine disorders: Secondary | ICD-10-CM | POA: Diagnosis not present

## 2022-12-01 NOTE — Telephone Encounter (Signed)
Requested medication (s) are due for refill today: yes  Requested medication (s) are on the active medication list: yes    Last refill: 08/30/22  #90  0 refills  Future visit scheduled yes 12/09/22  Notes to clinic: Failed due to labs, please review. Thank you.  Requested Prescriptions  Pending Prescriptions Disp Refills   JARDIANCE 25 MG TABS tablet [Pharmacy Med Name: JARDIANCE 25MG  TABLETS] 90 tablet 0    Sig: TAKE 1 TABLET(25 MG) BY MOUTH DAILY     Endocrinology:  Diabetes - SGLT2 Inhibitors Failed - 12/01/2022  7:03 AM      Failed - Cr in normal range and within 360 days    Creatinine, Ser  Date Value Ref Range Status  11/02/2022 1.19 (H) 0.57 - 1.00 mg/dL Final   Creatinine,U  Date Value Ref Range Status  10/24/2018 67.4 mg/dL Final         Failed - HBA1C is between 0 and 7.9 and within 180 days    HbA1c, POC (controlled diabetic range)  Date Value Ref Range Status  11/02/2022 8.2 (A) 0.0 - 7.0 % Final         Failed - eGFR in normal range and within 360 days    GFR calc Af Amer  Date Value Ref Range Status  12/06/2019 74 >59 mL/min/1.73 Final    Comment:    **[facility] currently reports eGFR in compliance with the current**   recommendations of the [employer]. [facility] will   update reporting as new guidelines are published from the NKF-ASN   Task force.    GFR, Estimated  Date Value Ref Range Status  04/21/2022 50 (L) >60 mL/min Final    Comment:    (NOTE) Calculated using the CKD-EPI Creatinine Equation (2021)    eGFR  Date Value Ref Range Status  11/02/2022 49 (L) >59 mL/min/1.73 Final         Passed - Valid encounter within last 6 months    Recent Outpatient Visits           4 weeks ago Type 2 diabetes mellitus with obesity [facility])   [place] [facility] Marcine Matar, MD   1 month ago Flank pain   [place] [facility] [place], Odette Horns, MD   5 months ago Type 2 diabetes mellitus  with obesity [facility])   [place] [facility] Marcine Matar, MD   1 year ago Jaw claudication   [place] [facility] Marcine Matar, MD   1 year ago Diabetes mellitus type 2 in obese [facility])   [facility] [place] Drucilla Chalet, RPH-CPP       Future Appointments             In 1 week Marcine Matar, MD [facility]   In 3 months Laural Benes, Binnie Rail, MD [facility]

## 2022-12-07 DIAGNOSIS — Z7982 Long term (current) use of aspirin: Secondary | ICD-10-CM | POA: Diagnosis not present

## 2022-12-07 DIAGNOSIS — Z9181 History of falling: Secondary | ICD-10-CM | POA: Diagnosis not present

## 2022-12-07 DIAGNOSIS — E1151 Type 2 diabetes mellitus with diabetic peripheral angiopathy without gangrene: Secondary | ICD-10-CM | POA: Diagnosis not present

## 2022-12-07 DIAGNOSIS — E785 Hyperlipidemia, unspecified: Secondary | ICD-10-CM | POA: Diagnosis not present

## 2022-12-07 DIAGNOSIS — R471 Dysarthria and anarthria: Secondary | ICD-10-CM | POA: Diagnosis not present

## 2022-12-07 DIAGNOSIS — I152 Hypertension secondary to endocrine disorders: Secondary | ICD-10-CM | POA: Diagnosis not present

## 2022-12-07 DIAGNOSIS — E1159 Type 2 diabetes mellitus with other circulatory complications: Secondary | ICD-10-CM | POA: Diagnosis not present

## 2022-12-07 DIAGNOSIS — E1169 Type 2 diabetes mellitus with other specified complication: Secondary | ICD-10-CM | POA: Diagnosis not present

## 2022-12-07 DIAGNOSIS — Z7984 Long term (current) use of oral hypoglycemic drugs: Secondary | ICD-10-CM | POA: Diagnosis not present

## 2022-12-07 DIAGNOSIS — G231 Progressive supranuclear ophthalmoplegia [Steele-Richardson-Olszewski]: Secondary | ICD-10-CM | POA: Diagnosis not present

## 2022-12-07 DIAGNOSIS — M19031 Primary osteoarthritis, right wrist: Secondary | ICD-10-CM | POA: Diagnosis not present

## 2022-12-07 DIAGNOSIS — G8929 Other chronic pain: Secondary | ICD-10-CM | POA: Diagnosis not present

## 2022-12-07 DIAGNOSIS — R1312 Dysphagia, oropharyngeal phase: Secondary | ICD-10-CM | POA: Diagnosis not present

## 2022-12-07 DIAGNOSIS — H409 Unspecified glaucoma: Secondary | ICD-10-CM | POA: Diagnosis not present

## 2022-12-07 DIAGNOSIS — N1831 Chronic kidney disease, stage 3a: Secondary | ICD-10-CM | POA: Diagnosis not present

## 2022-12-07 DIAGNOSIS — Z794 Long term (current) use of insulin: Secondary | ICD-10-CM | POA: Diagnosis not present

## 2022-12-07 DIAGNOSIS — M545 Low back pain, unspecified: Secondary | ICD-10-CM | POA: Diagnosis not present

## 2022-12-07 DIAGNOSIS — E1122 Type 2 diabetes mellitus with diabetic chronic kidney disease: Secondary | ICD-10-CM | POA: Diagnosis not present

## 2022-12-09 ENCOUNTER — Ambulatory Visit: Payer: 59 | Attending: Internal Medicine | Admitting: Internal Medicine

## 2022-12-09 ENCOUNTER — Telehealth: Payer: Self-pay | Admitting: Internal Medicine

## 2022-12-09 ENCOUNTER — Encounter: Payer: Self-pay | Admitting: Internal Medicine

## 2022-12-09 VITALS — BP 134/85 | HR 84 | Temp 97.9°F | Ht 63.0 in | Wt 192.0 lb

## 2022-12-09 DIAGNOSIS — E119 Type 2 diabetes mellitus without complications: Secondary | ICD-10-CM | POA: Diagnosis not present

## 2022-12-09 DIAGNOSIS — E1169 Type 2 diabetes mellitus with other specified complication: Secondary | ICD-10-CM

## 2022-12-09 DIAGNOSIS — H409 Unspecified glaucoma: Secondary | ICD-10-CM | POA: Diagnosis not present

## 2022-12-09 DIAGNOSIS — M545 Low back pain, unspecified: Secondary | ICD-10-CM | POA: Diagnosis not present

## 2022-12-09 DIAGNOSIS — I152 Hypertension secondary to endocrine disorders: Secondary | ICD-10-CM | POA: Diagnosis not present

## 2022-12-09 DIAGNOSIS — E1122 Type 2 diabetes mellitus with diabetic chronic kidney disease: Secondary | ICD-10-CM | POA: Diagnosis not present

## 2022-12-09 DIAGNOSIS — E785 Hyperlipidemia, unspecified: Secondary | ICD-10-CM | POA: Diagnosis not present

## 2022-12-09 DIAGNOSIS — G8929 Other chronic pain: Secondary | ICD-10-CM | POA: Diagnosis not present

## 2022-12-09 DIAGNOSIS — R1312 Dysphagia, oropharyngeal phase: Secondary | ICD-10-CM | POA: Diagnosis not present

## 2022-12-09 DIAGNOSIS — R471 Dysarthria and anarthria: Secondary | ICD-10-CM | POA: Diagnosis not present

## 2022-12-09 DIAGNOSIS — E1159 Type 2 diabetes mellitus with other circulatory complications: Secondary | ICD-10-CM | POA: Diagnosis not present

## 2022-12-09 DIAGNOSIS — Z9181 History of falling: Secondary | ICD-10-CM | POA: Diagnosis not present

## 2022-12-09 DIAGNOSIS — E1151 Type 2 diabetes mellitus with diabetic peripheral angiopathy without gangrene: Secondary | ICD-10-CM | POA: Diagnosis not present

## 2022-12-09 DIAGNOSIS — M19031 Primary osteoarthritis, right wrist: Secondary | ICD-10-CM | POA: Diagnosis not present

## 2022-12-09 DIAGNOSIS — Z7984 Long term (current) use of oral hypoglycemic drugs: Secondary | ICD-10-CM

## 2022-12-09 DIAGNOSIS — Z794 Long term (current) use of insulin: Secondary | ICD-10-CM

## 2022-12-09 DIAGNOSIS — Z789 Other specified health status: Secondary | ICD-10-CM | POA: Diagnosis not present

## 2022-12-09 DIAGNOSIS — Z7982 Long term (current) use of aspirin: Secondary | ICD-10-CM | POA: Diagnosis not present

## 2022-12-09 DIAGNOSIS — G231 Progressive supranuclear ophthalmoplegia [Steele-Richardson-Olszewski]: Secondary | ICD-10-CM | POA: Diagnosis not present

## 2022-12-09 DIAGNOSIS — N1831 Chronic kidney disease, stage 3a: Secondary | ICD-10-CM | POA: Diagnosis not present

## 2022-12-09 DIAGNOSIS — E669 Obesity, unspecified: Secondary | ICD-10-CM

## 2022-12-09 MED ORDER — LANTUS SOLOSTAR 100 UNIT/ML ~~LOC~~ SOPN: 16.0000 [IU] | PEN_INJECTOR | Freq: Every day | SUBCUTANEOUS | 99 refills | Status: DC

## 2022-12-09 NOTE — Progress Notes (Signed)
Patient ID: Kristi Campos, female    DOB: 08-10-1950  MRN: [id number]  CC: Medication Problem (Ozempic side effect - diarrhea, abdominal pain, food avoidance due to pain /Already received flu vax)   Subjective: Kristi Campos is a 72 y.o. female who presents for UC visit.  Daughter is with her.  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    Patient was started on Ozempic around September of last year. On last visit 11/02/2022, patient was out of Ozempic for 2 months.  We restarted the medication at low dose of 0.25 mg.  She was to continue Lantus insulin 15 units daily and Jardiance 25 mg daily.  A1c on that visit was 8.2. Today pt and daughter reports that after restarting Ozempic and given 1st shot she developed nausea, food avoidance, abdominal pain and severe diarrhea x 1 wk.   Checks BS every morning before BF.  Stopped the CGM because it made her arm sore.  Has log with her.  Range in past 10 days has been 113-155. Also has some BP readings.  Checks every morning.  Some readings 125/71, 133/94, 128/76, 142/94, 131/76 Did not take med as yet for the morning.  Currently on Losartan/HCTZ 100/25 mg 1/2 tab daily  Patient Active Problem List   Diagnosis Date Noted   Stage 3a chronic kidney disease (HCC) 06/24/2022   Patient has active power of attorney for health care 06/24/2022   Progressive supranuclear palsy (HCC) 01/27/2021   AKI (acute kidney injury) (HCC) 10/18/2020   Chest pain 10/17/2020   Primary osteoarthritis, right wrist 02/28/2020   Peripheral arterial disease (HCC) 10/03/2019   Essential hypertension 10/03/2019   Hyperlipidemia 10/03/2019   Type 2 diabetes mellitus with obesity (HCC) 11/24/2018   Chronic right-sided low back pain without sciatica 10/24/2018     Current Outpatient Medications on File Prior to Visit  Medication Sig Dispense Refill    AMBULATORY NON FORMULARY MEDICATION Please dispense 1 power wheelchair 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     atorvastatin (LIPITOR) 10 MG tablet Take 1 tablet (10 mg total) by mouth daily. 90 tablet 1   Blood Glucose Monitoring Suppl (ACCU-CHEK GUIDE) w/Device KIT Use to check blood sugar TID. Dx E11.69 1 kit 0   carbidopa-levodopa (SINEMET IR) 25-100 MG tablet TAKE 1 TABLET BY MOUTH THREE TIMES DAILY 270 tablet 1   diclofenac Sodium (VOLTAREN) 1 % GEL Apply 4 g topically 2 (two) times daily as needed. 100 g 1   glucose blood (ACCU-CHEK GUIDE) test strip USE AS DIRECTED THREE TIMES DAILY 100 strip 2   Insulin Pen Needle (B-D ULTRAFINE III SHORT PEN) 31G X 8 MM MISC USE AS DIRECTED 100 each 2   JARDIANCE 25 MG TABS tablet TAKE 1 TABLET(25 MG) BY MOUTH DAILY 90 tablet 0   LANTUS SOLOSTAR 100 UNIT/ML Solostar Pen INJECT 12 UNITS INTO THE SKIN DAILY 15 mL PRN   lidocaine (LIDODERM) 5 % Place 1 patch onto the skin daily. Remove & Discard patch within 12 hours or as directed by MD 30 patch 1   losartan-hydrochlorothiazide (HYZAAR) 100-25 MG tablet TAKE 1/2 TABLET BY MOUTH DAILY 45 tablet 1   nitroGLYCERIN (NITROSTAT) 0.4 MG SL tablet Place 1 tablet (0.4 mg total) under the tongue every 5 (five)  minutes as needed for chest pain. 20 tablet 0   pantoprazole (PROTONIX) 40 MG tablet TAKE 1 TABLET BY MOUTH DAILY AT NOON 90 tablet 2   Semaglutide,0.25 or 0.5MG /DOS, (OZEMPIC, 0.25 OR 0.5 MG/DOSE,) 2 MG/3ML SOPN Inject 0.25 mg into the skin once a week. 3 mL 3   tiZANidine (ZANAFLEX) 2 MG tablet Take 1 tablet (2 mg total) by mouth at bedtime. 30 tablet 0   [DISCONTINUED] simvastatin (ZOCOR) 10 MG tablet Take 10 mg by mouth at bedtime.     No current facility-administered medications on file prior to visit.    Allergies  Allergen Reactions   Metformin And Related Diarrhea    Social History   Socioeconomic History   Marital status: Widowed     Spouse name: Not on file   Number of children: 4   Years of education: Not on file   Highest education level: Not on file  Occupational History    Employer: other   Occupation: retired    Comment: weave for living  Tobacco Use   Smoking status: Never   Smokeless tobacco: Never  Vaping Use   Vaping status: Never Used  Substance and Sexual Activity   Alcohol use: No    Alcohol/week: 0.0 standard drinks of alcohol   Drug use: No   Sexual activity: Not Currently    Partners: Male    Birth control/protection: Post-menopausal  Other Topics Concern   Not on file  Social History Narrative   Right handed    Lives at home alone ONE LEVEL   Caffeine1 cup daily   Social Determinants of Health   Financial Resource Strain: Low Risk  (04/29/2022)   Overall Financial Resource Strain (CARDIA)    Difficulty of Paying Living Expenses: Not hard at all  Food Insecurity: No Food Insecurity (04/29/2022)   Hunger Vital Sign    Worried About Running Out of Food in the Last Year: Never true    Ran Out of Food in the Last Year: Never true  Transportation Needs: No Transportation Needs (04/29/2022)   PRAPARE - Administrator, Civil Service (Medical): No    Lack of Transportation (Non-Medical): No  Physical Activity: Inactive (04/29/2022)   Exercise Vital Sign    Days of Exercise per Week: 0 days    Minutes of Exercise per Session: 0 min  Stress: No Stress Concern Present (04/29/2022)   [employer] of Occupational Health - Occupational Stress Questionnaire    Feeling of Stress : Not at all  Social Connections: Not on file  Intimate Partner Violence: Not on file    Family History  Problem Relation Age of Onset   Bipolar disorder Mother    Hypertension Sister    Hypertension Sister    Hypertension Sister    Hypertension Sister    Hypertension Sister    Hypertension Sister    Hypertension Daughter    Diabetes Son    Prostate cancer Son     Past Surgical History:   Procedure Laterality Date   ARTERY BIOPSY Left 11/26/2021   Procedure: BIOPSY TEMPORAL ARTERY;  Surgeon: Leonie Douglas, MD;  Location: [facility];  Service: Vascular;  Laterality: Left;   COLONOSCOPY     I & D EXTREMITY Right 08/09/2013   Procedure: MINOR IRRIGATION AND DEBRIDEMENT EXTREMITY;  Surgeon: Wyn Forster, MD;  Location:  SURGERY CENTER;  Service: Orthopedics;  Laterality: Right;  long       wound class 4  ROS: Review of Systems Negative except as stated above  PHYSICAL EXAM: BP 134/85 (BP Location: Left Arm, Patient Position: Sitting, Cuff Size: Large)   Pulse 84   Temp 97.9 F (36.6 C) (Oral)   Ht 5\' 3"  (1.6 m)   Wt 192 lb (87.1 kg)   SpO2 97%   BMI 34.01 kg/m   Physical Exam BP 140/86 General appearance - alert, well appearing, and in no distress Mental status - pt with quite demeanor, she stutters and has difficulty in getting words out.      Latest Ref Rng & Units 11/02/2022   11:16 AM 04/21/2022    1:29 PM 11/26/2021    7:15 AM  CMP  Glucose 70 - 99 mg/dL 756  433  295   BUN 8 - 27 mg/dL 18  17  26    Creatinine 0.57 - 1.00 mg/dL 1.88  4.16  6.06   Sodium 134 - 144 mmol/L 144  138  142   Potassium 3.5 - 5.2 mmol/L 4.4  3.9  3.7   Chloride 96 - 106 mmol/L 103  104  105   CO2 20 - 29 mmol/L 26  25    Calcium 8.7 - 10.3 mg/dL 30.1  9.1    Total Protein 6.0 - 8.5 g/dL 7.6  7.2    Total Bilirubin 0.0 - 1.2 mg/dL 0.4  0.8    Alkaline Phos 44 - 121 IU/L 82  61    AST 0 - 40 IU/L 14  17    ALT 0 - 32 IU/L 9  <5     Lipid Panel     Component Value Date/Time   CHOL 180 11/02/2022 1116   TRIG 138 11/02/2022 1116   HDL 51 11/02/2022 1116   CHOLHDL 3.5 11/02/2022 1116   LDLCALC 105 (H) 11/02/2022 1116    CBC    Component Value Date/Time   WBC 5.4 04/21/2022 1329   RBC 4.36 04/21/2022 1329   HGB 13.4 04/21/2022 1329   HGB 13.9 10/22/2021 1134   HCT 40.7 04/21/2022 1329   HCT 41.8 10/22/2021 1134   PLT 253 04/21/2022 1329   PLT 277  10/22/2021 1134   MCV 93.3 04/21/2022 1329   MCV 92 10/22/2021 1134   MCH 30.7 04/21/2022 1329   MCHC 32.9 04/21/2022 1329   RDW 14.6 04/21/2022 1329   RDW 13.9 10/22/2021 1134   LYMPHSABS 2.1 04/21/2022 1329   MONOABS 0.5 04/21/2022 1329   EOSABS 0.1 04/21/2022 1329   BASOSABS 0.0 04/21/2022 1329    ASSESSMENT AND PLAN: 1. Medication intolerance Stop Ozempic.  Continue Lantus but increase dose to 16 units daily and Jardiance 25 mg daily.  2. Type 2 diabetes mellitus with obesity (HCC) See #1 above.  Continue to monitor blood sugars.  Encourage healthy eating habits. - Ambulatory referral to Ophthalmology - insulin glargine (LANTUS SOLOSTAR) 100 UNIT/ML Solostar Pen; Inject 16 Units into the skin daily.  Dispense: 15 mL; Refill: PRN  3. Insulin long-term use (HCC) 4. Diabetes mellitus treated with oral medication (HCC) See #1 above.  5. Hypertension associated with diabetes (HCC) Not at goal even on repeat blood pressure check.  However patient has not taken losartan/HCTZ as yet for the morning.  She will take it as soon as she returns home.  Continue to monitor blood pressure.   Patient was given the opportunity to ask questions.  Patient verbalized understanding of the plan and was able to repeat key elements of the plan.  This documentation was completed using Paediatric nurse.  Any transcriptional errors are unintentional.  No orders of the defined types were placed in this encounter.    Requested Prescriptions    No prescriptions requested or ordered in this encounter    No follow-ups on file.  Jonah Blue, MD, FACP

## 2022-12-09 NOTE — Telephone Encounter (Signed)
Okay to give verbal order for PT.

## 2022-12-09 NOTE — Telephone Encounter (Signed)
Kristi Campos with [facility] called to ask to continue PT 2 week 3 and 1 week 3  [phone]

## 2022-12-09 NOTE — Patient Instructions (Signed)
Stop Ozempic.  Increase Lantus insulin to 16 units daily.  Your blood pressure was elevated this morning.  Please take your blood pressure medicines when you return home.  Our goal is to keep your blood pressure 130/80 or lower.

## 2022-12-10 ENCOUNTER — Telehealth: Payer: Self-pay

## 2022-12-10 DIAGNOSIS — M19031 Primary osteoarthritis, right wrist: Secondary | ICD-10-CM | POA: Diagnosis not present

## 2022-12-10 DIAGNOSIS — E785 Hyperlipidemia, unspecified: Secondary | ICD-10-CM | POA: Diagnosis not present

## 2022-12-10 DIAGNOSIS — Z9181 History of falling: Secondary | ICD-10-CM | POA: Diagnosis not present

## 2022-12-10 DIAGNOSIS — R1319 Other dysphagia: Secondary | ICD-10-CM

## 2022-12-10 DIAGNOSIS — Z7982 Long term (current) use of aspirin: Secondary | ICD-10-CM | POA: Diagnosis not present

## 2022-12-10 DIAGNOSIS — G231 Progressive supranuclear ophthalmoplegia [Steele-Richardson-Olszewski]: Secondary | ICD-10-CM | POA: Diagnosis not present

## 2022-12-10 DIAGNOSIS — N1831 Chronic kidney disease, stage 3a: Secondary | ICD-10-CM | POA: Diagnosis not present

## 2022-12-10 DIAGNOSIS — E1159 Type 2 diabetes mellitus with other circulatory complications: Secondary | ICD-10-CM | POA: Diagnosis not present

## 2022-12-10 DIAGNOSIS — Z7984 Long term (current) use of oral hypoglycemic drugs: Secondary | ICD-10-CM | POA: Diagnosis not present

## 2022-12-10 DIAGNOSIS — I152 Hypertension secondary to endocrine disorders: Secondary | ICD-10-CM | POA: Diagnosis not present

## 2022-12-10 DIAGNOSIS — H409 Unspecified glaucoma: Secondary | ICD-10-CM | POA: Diagnosis not present

## 2022-12-10 DIAGNOSIS — Z794 Long term (current) use of insulin: Secondary | ICD-10-CM | POA: Diagnosis not present

## 2022-12-10 DIAGNOSIS — G8929 Other chronic pain: Secondary | ICD-10-CM | POA: Diagnosis not present

## 2022-12-10 DIAGNOSIS — M545 Low back pain, unspecified: Secondary | ICD-10-CM | POA: Diagnosis not present

## 2022-12-10 DIAGNOSIS — E1169 Type 2 diabetes mellitus with other specified complication: Secondary | ICD-10-CM | POA: Diagnosis not present

## 2022-12-10 DIAGNOSIS — E1122 Type 2 diabetes mellitus with diabetic chronic kidney disease: Secondary | ICD-10-CM | POA: Diagnosis not present

## 2022-12-10 DIAGNOSIS — R1312 Dysphagia, oropharyngeal phase: Secondary | ICD-10-CM | POA: Diagnosis not present

## 2022-12-10 DIAGNOSIS — R471 Dysarthria and anarthria: Secondary | ICD-10-CM | POA: Diagnosis not present

## 2022-12-10 DIAGNOSIS — E1151 Type 2 diabetes mellitus with diabetic peripheral angiopathy without gangrene: Secondary | ICD-10-CM | POA: Diagnosis not present

## 2022-12-10 NOTE — Telephone Encounter (Signed)
Copied from CRM [id number]. Topic: Referral - Request for Referral >> Dec 10, 2022  9:17 AM Marlow Baars wrote: Reason for CRM: Deidre with [facility] called in to let the provider know the patient has progressing dyshagia and she is requesting a referral to [facility] for a Modified Barium Swallow study. She said please fax the order to [phone] and call [phone] to confirm it went through.

## 2022-12-10 NOTE — Telephone Encounter (Signed)
Spoke with Onalee Hua with [facility] Verbal orders given to continue PT 2 week 3 and 1 week 3 .

## 2022-12-11 NOTE — Addendum Note (Signed)
Addended by: Jonah Blue B on: 12/11/2022 08:53 AM   Modules accepted: Orders

## 2022-12-11 NOTE — Telephone Encounter (Signed)
Referral submitted for swallow eval.

## 2022-12-13 ENCOUNTER — Other Ambulatory Visit (HOSPITAL_COMMUNITY): Payer: Self-pay | Admitting: *Deleted

## 2022-12-13 ENCOUNTER — Telehealth (HOSPITAL_COMMUNITY): Payer: Self-pay | Admitting: *Deleted

## 2022-12-13 DIAGNOSIS — R1310 Dysphagia, unspecified: Secondary | ICD-10-CM

## 2022-12-13 DIAGNOSIS — M19031 Primary osteoarthritis, right wrist: Secondary | ICD-10-CM | POA: Diagnosis not present

## 2022-12-13 DIAGNOSIS — M545 Low back pain, unspecified: Secondary | ICD-10-CM | POA: Diagnosis not present

## 2022-12-13 DIAGNOSIS — R1312 Dysphagia, oropharyngeal phase: Secondary | ICD-10-CM | POA: Diagnosis not present

## 2022-12-13 DIAGNOSIS — Z794 Long term (current) use of insulin: Secondary | ICD-10-CM | POA: Diagnosis not present

## 2022-12-13 DIAGNOSIS — H409 Unspecified glaucoma: Secondary | ICD-10-CM | POA: Diagnosis not present

## 2022-12-13 DIAGNOSIS — I152 Hypertension secondary to endocrine disorders: Secondary | ICD-10-CM | POA: Diagnosis not present

## 2022-12-13 DIAGNOSIS — E1159 Type 2 diabetes mellitus with other circulatory complications: Secondary | ICD-10-CM | POA: Diagnosis not present

## 2022-12-13 DIAGNOSIS — G231 Progressive supranuclear ophthalmoplegia [Steele-Richardson-Olszewski]: Secondary | ICD-10-CM | POA: Diagnosis not present

## 2022-12-13 DIAGNOSIS — Z7984 Long term (current) use of oral hypoglycemic drugs: Secondary | ICD-10-CM | POA: Diagnosis not present

## 2022-12-13 DIAGNOSIS — E1122 Type 2 diabetes mellitus with diabetic chronic kidney disease: Secondary | ICD-10-CM | POA: Diagnosis not present

## 2022-12-13 DIAGNOSIS — G8929 Other chronic pain: Secondary | ICD-10-CM | POA: Diagnosis not present

## 2022-12-13 DIAGNOSIS — R471 Dysarthria and anarthria: Secondary | ICD-10-CM | POA: Diagnosis not present

## 2022-12-13 DIAGNOSIS — Z9181 History of falling: Secondary | ICD-10-CM | POA: Diagnosis not present

## 2022-12-13 DIAGNOSIS — E1151 Type 2 diabetes mellitus with diabetic peripheral angiopathy without gangrene: Secondary | ICD-10-CM | POA: Diagnosis not present

## 2022-12-13 DIAGNOSIS — E785 Hyperlipidemia, unspecified: Secondary | ICD-10-CM | POA: Diagnosis not present

## 2022-12-13 DIAGNOSIS — N1831 Chronic kidney disease, stage 3a: Secondary | ICD-10-CM | POA: Diagnosis not present

## 2022-12-13 DIAGNOSIS — Z7982 Long term (current) use of aspirin: Secondary | ICD-10-CM | POA: Diagnosis not present

## 2022-12-13 DIAGNOSIS — E1169 Type 2 diabetes mellitus with other specified complication: Secondary | ICD-10-CM | POA: Diagnosis not present

## 2022-12-13 NOTE — Telephone Encounter (Signed)
Attempted to contact patient's daughter to schedule OP MBS. Left VM. RKEEL

## 2022-12-14 NOTE — Progress Notes (Unsigned)
Assessment/Plan:   1.  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  Understands that there is an increased risk of falls, aspiration as subsequent morbidity and mortality from this disease compared to Parkinson's disease.             -Patient did not do well off of levodopa, so wants to continue carbidopa/levodopa 25/100, 1 tablet 3 times per day             -Patient does not drive.  She does live by herself, but daughter lives next door.             -Insurance initially declined her power WC and I emailed adept to figure out if we can appeal, which we did.  It was ultimately approved and they apparently had a $600 copay.  It took her a short time to get that money and by the time she did, she was told that she needed to start the process over.  I will need to check on this yet again.  I told her to call me next week to see the status.  We started this wheelchair evaluation 1 year ago, and this is really been going on too long.     2.  Eyelid opening apraxia             -Not interested in Botox right now.   3.  Vision change/generalized fatigue jaw claudication             -TA biopsy negative             -Her vision issues really sound most consistent with PSP.  She has diplopia and bilateral blurry vision (which is likely a combination of cataracts as well as PSP issues).  She is having no unilateral vision change.    4.  Dizziness             -Improved after blood pressure medications decreased   5.  Dysphagia             -She had a modified barium swallow May 11, 2022.  This demonstrated moderately impaired oral phase and mildly impaired pharyngeal phase.  No aspiration was observed.  Mechanical soft diet with thin liquids was recommended.  -pcp reordered mbe and its scheduled for monday  -she reports that she doesn't want feeding tube but would want intubation/chest compressions (she initially said she would not but changed her mind).  Her  daughter is going to be working on changing advanced directives   Subjective:   Kristi Campos was seen today in follow-up for PSP.  She is back on levodopa.  She did not feel well off of it.  She was in the room on June 8 with headache and workup was unremarkable.  She called here after the emergency room visit stating that she was getting headaches every other day.  I was out of the office and my partner offered her a low dose of tizanidine at bedtime as needed.  This helped but she is off now as HA's resolved.  Separately, she is having falls.  Insurance initially declined her power WC and I emailed adept to figure out if we can appeal, which we did.  It was ultimately approved and they apparently had a $600 copay.  It took her a short time to get that money and by the time she did, she was told that she  needed to start the process over.  I will need to check on this yet again.  She is getting ready to do a mbe, ordered by primary care.  Last visit, we discussed doing advanced directives and her daughter states that they did this.  Patient states that she would want chest compressions, but initially states that she would not want to be on the ventilator..  After discussions about this, she ultimately stated that she would want to be on a ventilator.  Her daughter states that they will need to change the advance directives.   Current prescribed movement disorder medications: Carbidopa/levodopa 25/100, 1 tablet 3 times per day. Zanaflex, 2 mg at bed   ALLERGIES:   Allergies  Allergen Reactions   Metformin And Related Diarrhea    CURRENT MEDICATIONS:  Outpatient Encounter Medications as of 12/16/2022  Medication Sig   AMBULATORY NON FORMULARY MEDICATION Please dispense 1 power wheelchair PSP patient DX code G23.1   aspirin EC 81 MG tablet Take 81 mg by mouth daily.   atorvastatin (LIPITOR) 10 MG tablet Take 1 tablet (10 mg total) by mouth daily.   Blood Glucose Monitoring Suppl (ACCU-CHEK  GUIDE) w/Device KIT Use to check blood sugar TID. Dx E11.69   carbidopa-levodopa (SINEMET IR) 25-100 MG tablet TAKE 1 TABLET BY MOUTH THREE TIMES DAILY   diclofenac Sodium (VOLTAREN) 1 % GEL Apply 4 g topically 2 (two) times daily as needed.   glucose blood (ACCU-CHEK GUIDE) test strip USE AS DIRECTED THREE TIMES DAILY   insulin glargine (LANTUS SOLOSTAR) 100 UNIT/ML Solostar Pen Inject 16 Units into the skin daily.   Insulin Pen Needle (B-D ULTRAFINE III SHORT PEN) 31G X 8 MM MISC USE AS DIRECTED   JARDIANCE 25 MG TABS tablet TAKE 1 TABLET(25 MG) BY MOUTH DAILY   losartan-hydrochlorothiazide (HYZAAR) 100-25 MG tablet TAKE 1/2 TABLET BY MOUTH DAILY   nitroGLYCERIN (NITROSTAT) 0.4 MG SL tablet Place 1 tablet (0.4 mg total) under the tongue every 5 (five) minutes as needed for chest pain.   pantoprazole (PROTONIX) 40 MG tablet TAKE 1 TABLET BY MOUTH DAILY AT NOON   tiZANidine (ZANAFLEX) 2 MG tablet Take 1 tablet (2 mg total) by mouth at bedtime.   lidocaine (LIDODERM) 5 % Place 1 patch onto the skin daily. Remove & Discard patch within 12 hours or as directed by MD   [DISCONTINUED] simvastatin (ZOCOR) 10 MG tablet Take 10 mg by mouth at bedtime.   No facility-administered encounter medications on file as of 12/16/2022.    Objective:   PHYSICAL EXAMINATION:    VITALS:   Vitals:   12/16/22 1003  BP: 122/82  Pulse: 71  SpO2: 97%  Weight: 192 lb (87.1 kg)  Height: 5\' 3"  (1.6 m)     GEN:  The patient appears stated age and is in NAD. HEENT:  Normocephalic, atraumatic.  The mucous membranes are moist. The superficial temporal arteries are without ropiness or tenderness.   CV  RRR Lungs:  CTAB Neck:  no bruits.  Chin toward check   Neurological examination:  Orientation: The patient is alert and oriented x3.  Cranial nerves: There is good facial symmetry. There is facial hypomimia.  Extraocular muscles are intact.  there is some eye opening apraxia.  Downgaze was just slightly  decreased. No significant square wave jerks.  The visual fields are full to confrontational testing. The speech is fluent and dysarthric. some stuttering (same as previous).  She has trouble with the gutteral sounds.  Soft palate  rises symmetrically and there is no tongue deviation. Hearing is intact to conversational tone. Sensation: Sensation is intact to light touch Motor: Strength is at least antigravity x 4    Movement examination: Tone: There is normal tone in the bilateral upper extremities.  The tone in the lower extremities is normal.  Abnormal movements: As above, she has some eyelid opening apraxia.  She has some pulling of the right face intermittently. Coordination:  There is decremation, with any form of RAMS, including alternating supination and pronation of the forearm, hand opening and closing, finger taps, heel taps and toe taps on the L Gait and Station: not tested today  I have reviewed and interpreted the following labs independently    Chemistry      Component Value Date/Time   NA 144 11/02/2022 1116   K 4.4 11/02/2022 1116   CL 103 11/02/2022 1116   CO2 26 11/02/2022 1116   BUN 18 11/02/2022 1116   CREATININE 1.19 (H) 11/02/2022 1116      Component Value Date/Time   CALCIUM 10.0 11/02/2022 1116   ALKPHOS 82 11/02/2022 1116   AST 14 11/02/2022 1116   ALT 9 11/02/2022 1116   BILITOT 0.4 11/02/2022 1116       Lab Results  Component Value Date   WBC 5.4 04/21/2022   HGB 13.4 04/21/2022   HCT 40.7 04/21/2022   MCV 93.3 04/21/2022   PLT 253 04/21/2022    Lab Results  Component Value Date   TSH 0.984 07/23/2021   Lab Results  Component Value Date   ESRSEDRATE 32 11/23/2021   Lab Results  Component Value Date   CRP 7 11/23/2021     Total time spent on today's visit was 40 minutes, including both face-to-face time and nonface-to-face time.  Time included that spent on review of records (prior notes available to me/labs/imaging if pertinent),  discussing treatment and goals, answering patient's questions and coordinating care.  Cc:  Marcine Matar, MD

## 2022-12-15 DIAGNOSIS — Z7984 Long term (current) use of oral hypoglycemic drugs: Secondary | ICD-10-CM | POA: Diagnosis not present

## 2022-12-15 DIAGNOSIS — R471 Dysarthria and anarthria: Secondary | ICD-10-CM | POA: Diagnosis not present

## 2022-12-15 DIAGNOSIS — I152 Hypertension secondary to endocrine disorders: Secondary | ICD-10-CM | POA: Diagnosis not present

## 2022-12-15 DIAGNOSIS — G231 Progressive supranuclear ophthalmoplegia [Steele-Richardson-Olszewski]: Secondary | ICD-10-CM | POA: Diagnosis not present

## 2022-12-15 DIAGNOSIS — E785 Hyperlipidemia, unspecified: Secondary | ICD-10-CM | POA: Diagnosis not present

## 2022-12-15 DIAGNOSIS — E1151 Type 2 diabetes mellitus with diabetic peripheral angiopathy without gangrene: Secondary | ICD-10-CM | POA: Diagnosis not present

## 2022-12-15 DIAGNOSIS — H409 Unspecified glaucoma: Secondary | ICD-10-CM | POA: Diagnosis not present

## 2022-12-15 DIAGNOSIS — Z7982 Long term (current) use of aspirin: Secondary | ICD-10-CM | POA: Diagnosis not present

## 2022-12-15 DIAGNOSIS — G8929 Other chronic pain: Secondary | ICD-10-CM | POA: Diagnosis not present

## 2022-12-15 DIAGNOSIS — R1312 Dysphagia, oropharyngeal phase: Secondary | ICD-10-CM | POA: Diagnosis not present

## 2022-12-15 DIAGNOSIS — E1169 Type 2 diabetes mellitus with other specified complication: Secondary | ICD-10-CM | POA: Diagnosis not present

## 2022-12-15 DIAGNOSIS — N1831 Chronic kidney disease, stage 3a: Secondary | ICD-10-CM | POA: Diagnosis not present

## 2022-12-15 DIAGNOSIS — Z9181 History of falling: Secondary | ICD-10-CM | POA: Diagnosis not present

## 2022-12-15 DIAGNOSIS — E1159 Type 2 diabetes mellitus with other circulatory complications: Secondary | ICD-10-CM | POA: Diagnosis not present

## 2022-12-15 DIAGNOSIS — M545 Low back pain, unspecified: Secondary | ICD-10-CM | POA: Diagnosis not present

## 2022-12-15 DIAGNOSIS — Z794 Long term (current) use of insulin: Secondary | ICD-10-CM | POA: Diagnosis not present

## 2022-12-15 DIAGNOSIS — M19031 Primary osteoarthritis, right wrist: Secondary | ICD-10-CM | POA: Diagnosis not present

## 2022-12-15 DIAGNOSIS — E1122 Type 2 diabetes mellitus with diabetic chronic kidney disease: Secondary | ICD-10-CM | POA: Diagnosis not present

## 2022-12-16 ENCOUNTER — Ambulatory Visit: Payer: 59 | Admitting: Neurology

## 2022-12-16 ENCOUNTER — Telehealth: Payer: Self-pay

## 2022-12-16 VITALS — BP 122/82 | HR 71 | Ht 63.0 in | Wt 192.0 lb

## 2022-12-16 DIAGNOSIS — R482 Apraxia: Secondary | ICD-10-CM

## 2022-12-16 DIAGNOSIS — R1319 Other dysphagia: Secondary | ICD-10-CM | POA: Diagnosis not present

## 2022-12-16 DIAGNOSIS — G231 Progressive supranuclear ophthalmoplegia [Steele-Richardson-Olszewski]: Secondary | ICD-10-CM

## 2022-12-16 NOTE — Patient Instructions (Signed)
Call me if you don't hear from [facility] or the wheelchair place in a week  The physicians and staff at [facility] are committed to providing excellent care. You may receive a survey requesting feedback about your experience at our office. We strive to receive "very good" responses to the survey questions. If you feel that your experience would prevent you from giving the office a "very good " response, please contact our office to try to remedy the situation. We may be reached at [phone]. Thank you for taking the time out of your busy day to complete the survey.

## 2022-12-16 NOTE — Telephone Encounter (Signed)
Patients daughter came in requesting a referral to a dentist. Patients daughter would like a call back if an appointment is needed.

## 2022-12-17 ENCOUNTER — Telehealth: Payer: Self-pay | Admitting: Neurology

## 2022-12-17 DIAGNOSIS — E1169 Type 2 diabetes mellitus with other specified complication: Secondary | ICD-10-CM | POA: Diagnosis not present

## 2022-12-17 DIAGNOSIS — G231 Progressive supranuclear ophthalmoplegia [Steele-Richardson-Olszewski]: Secondary | ICD-10-CM | POA: Diagnosis not present

## 2022-12-17 DIAGNOSIS — H409 Unspecified glaucoma: Secondary | ICD-10-CM | POA: Diagnosis not present

## 2022-12-17 DIAGNOSIS — Z7982 Long term (current) use of aspirin: Secondary | ICD-10-CM | POA: Diagnosis not present

## 2022-12-17 DIAGNOSIS — E1151 Type 2 diabetes mellitus with diabetic peripheral angiopathy without gangrene: Secondary | ICD-10-CM | POA: Diagnosis not present

## 2022-12-17 DIAGNOSIS — R471 Dysarthria and anarthria: Secondary | ICD-10-CM | POA: Diagnosis not present

## 2022-12-17 DIAGNOSIS — E1159 Type 2 diabetes mellitus with other circulatory complications: Secondary | ICD-10-CM | POA: Diagnosis not present

## 2022-12-17 DIAGNOSIS — Z9181 History of falling: Secondary | ICD-10-CM | POA: Diagnosis not present

## 2022-12-17 DIAGNOSIS — I152 Hypertension secondary to endocrine disorders: Secondary | ICD-10-CM | POA: Diagnosis not present

## 2022-12-17 DIAGNOSIS — Z7984 Long term (current) use of oral hypoglycemic drugs: Secondary | ICD-10-CM | POA: Diagnosis not present

## 2022-12-17 DIAGNOSIS — G8929 Other chronic pain: Secondary | ICD-10-CM | POA: Diagnosis not present

## 2022-12-17 DIAGNOSIS — E1122 Type 2 diabetes mellitus with diabetic chronic kidney disease: Secondary | ICD-10-CM | POA: Diagnosis not present

## 2022-12-17 DIAGNOSIS — N1831 Chronic kidney disease, stage 3a: Secondary | ICD-10-CM | POA: Diagnosis not present

## 2022-12-17 DIAGNOSIS — R1312 Dysphagia, oropharyngeal phase: Secondary | ICD-10-CM | POA: Diagnosis not present

## 2022-12-17 DIAGNOSIS — M19031 Primary osteoarthritis, right wrist: Secondary | ICD-10-CM | POA: Diagnosis not present

## 2022-12-17 DIAGNOSIS — E785 Hyperlipidemia, unspecified: Secondary | ICD-10-CM | POA: Diagnosis not present

## 2022-12-17 DIAGNOSIS — Z794 Long term (current) use of insulin: Secondary | ICD-10-CM | POA: Diagnosis not present

## 2022-12-17 DIAGNOSIS — M545 Low back pain, unspecified: Secondary | ICD-10-CM | POA: Diagnosis not present

## 2022-12-17 NOTE — Telephone Encounter (Signed)
[employer] called in. They have faxed [place] information that needs to be filled out 3 times and have not heard back. They need the face to face chart note and a letter of medical necessity that needs to be signed. Fax the information to [phone]

## 2022-12-17 NOTE — Telephone Encounter (Signed)
Called & spoke to the daughter. Informed that we do not do dental referrals. She will need to call the patient's insurance and get names of several dentists in the area who are in their network for her insurance. Daughter expressed verbal understanding.

## 2022-12-17 NOTE — Telephone Encounter (Signed)
Spoke to [employer] at [employer] and that hey have been bought out and we have a new rep. Kristi Campos called right away and will be resending the paperwork to be filled out.

## 2022-12-20 ENCOUNTER — Encounter (HOSPITAL_COMMUNITY): Payer: 59

## 2022-12-20 ENCOUNTER — Ambulatory Visit (HOSPITAL_COMMUNITY)
Admission: RE | Admit: 2022-12-20 | Discharge: 2022-12-20 | Disposition: A | Discharge status: Home / Self Care | Payer: 59 | Source: Ambulatory Visit | Attending: Internal Medicine | Admitting: Internal Medicine

## 2022-12-20 DIAGNOSIS — Z794 Long term (current) use of insulin: Secondary | ICD-10-CM | POA: Diagnosis not present

## 2022-12-20 DIAGNOSIS — R471 Dysarthria and anarthria: Secondary | ICD-10-CM | POA: Diagnosis not present

## 2022-12-20 DIAGNOSIS — R1310 Dysphagia, unspecified: Secondary | ICD-10-CM | POA: Diagnosis not present

## 2022-12-20 DIAGNOSIS — E1122 Type 2 diabetes mellitus with diabetic chronic kidney disease: Secondary | ICD-10-CM | POA: Diagnosis not present

## 2022-12-20 DIAGNOSIS — E1159 Type 2 diabetes mellitus with other circulatory complications: Secondary | ICD-10-CM | POA: Diagnosis not present

## 2022-12-20 DIAGNOSIS — K219 Gastro-esophageal reflux disease without esophagitis: Secondary | ICD-10-CM | POA: Insufficient documentation

## 2022-12-20 DIAGNOSIS — R638 Other symptoms and signs concerning food and fluid intake: Secondary | ICD-10-CM | POA: Diagnosis not present

## 2022-12-20 DIAGNOSIS — M19031 Primary osteoarthritis, right wrist: Secondary | ICD-10-CM | POA: Diagnosis not present

## 2022-12-20 DIAGNOSIS — E1169 Type 2 diabetes mellitus with other specified complication: Secondary | ICD-10-CM | POA: Diagnosis not present

## 2022-12-20 DIAGNOSIS — G231 Progressive supranuclear ophthalmoplegia [Steele-Richardson-Olszewski]: Secondary | ICD-10-CM | POA: Diagnosis not present

## 2022-12-20 DIAGNOSIS — R1319 Other dysphagia: Secondary | ICD-10-CM

## 2022-12-20 DIAGNOSIS — E1151 Type 2 diabetes mellitus with diabetic peripheral angiopathy without gangrene: Secondary | ICD-10-CM | POA: Diagnosis not present

## 2022-12-20 DIAGNOSIS — M545 Low back pain, unspecified: Secondary | ICD-10-CM | POA: Diagnosis not present

## 2022-12-20 DIAGNOSIS — H409 Unspecified glaucoma: Secondary | ICD-10-CM | POA: Diagnosis not present

## 2022-12-20 DIAGNOSIS — E785 Hyperlipidemia, unspecified: Secondary | ICD-10-CM | POA: Diagnosis not present

## 2022-12-20 DIAGNOSIS — N1831 Chronic kidney disease, stage 3a: Secondary | ICD-10-CM | POA: Diagnosis not present

## 2022-12-20 DIAGNOSIS — I152 Hypertension secondary to endocrine disorders: Secondary | ICD-10-CM | POA: Diagnosis not present

## 2022-12-20 DIAGNOSIS — Z7984 Long term (current) use of oral hypoglycemic drugs: Secondary | ICD-10-CM | POA: Diagnosis not present

## 2022-12-20 DIAGNOSIS — Z7982 Long term (current) use of aspirin: Secondary | ICD-10-CM | POA: Diagnosis not present

## 2022-12-20 DIAGNOSIS — G8929 Other chronic pain: Secondary | ICD-10-CM | POA: Diagnosis not present

## 2022-12-20 DIAGNOSIS — R1312 Dysphagia, oropharyngeal phase: Secondary | ICD-10-CM | POA: Diagnosis not present

## 2022-12-20 DIAGNOSIS — Z9181 History of falling: Secondary | ICD-10-CM | POA: Diagnosis not present

## 2022-12-20 NOTE — Progress Notes (Unsigned)
Virtual Visit Via Video       Consent was obtained for video visit:  {yes no:[id number]} Answered questions that patient had about telehealth interaction:  {yes no:314532} I discussed the limitations, risks, security and privacy concerns of performing an evaluation and management service by telemedicine. I also discussed with the patient that there may be a patient responsible charge related to this service. The patient expressed understanding and agreed to proceed.  Pt location: Home Physician Location: office Name of referring provider:  Marcine Matar, MD I connected with Jaquelyn Bitter at patients initiation/request on 12/21/2022 at  8:15 AM EDT by video enabled telemedicine application and verified that I am speaking with the correct person using two identifiers. Pt MRN:  [id number] Pt DOB:  [birth year] Video Participants:  Jaquelyn Bitter;  ***  Assessment/Plan:   1.  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  Understands that there is an increased risk of falls, aspiration as subsequent morbidity and mortality from this disease compared to Parkinson's disease.             -Patient did not do well off of levodopa, so wants to continue carbidopa/levodopa 25/100, 1 tablet 3 times per day             -Patient does not drive.  She does live by herself, but daughter lives next door.             -As below, documented again need for electric wheelchair.  We are awaiting current/updated PT evaluation.  Patient has unfortunately been undergoing this process for over a year.     2.  Eyelid opening apraxia             -Not interested in Botox right now.   3.  Vision change/generalized fatigue jaw claudication             -TA biopsy negative             -Her vision issues really sound most consistent with PSP.  She has diplopia and bilateral blurry vision (which is likely a combination of cataracts as well as PSP issues).  She is having no  unilateral vision change.    4.  Dizziness             -Improved after blood pressure medications decreased   5.  Dysphagia             -She had a modified barium swallow May 11, 2022.  This demonstrated moderately impaired oral phase and mildly impaired pharyngeal phase.  No aspiration was observed.  Mechanical soft diet with thin liquids was recommended.  -pcp reordered mbe and its scheduled for monday  -she reports that she doesn't want feeding tube but would want intubation/chest compressions (she initially said she would not but changed her mind).  Her daughter is going to be working on changing advanced directives   Subjective:   Annalisia Lorenzen was seen today in follow-up for wheelchair evaluation for PSP.  She was just seen last week, but we did not specifically address in the notes the needs for the wheelchair, because we had previously done a wheelchair evaluation.  That was done over a year ago, but this has been quite an [employer].  They have asked [place] to redo it again, and she presents today for that.  I am currently awaiting the PT evaluation for review.  Pt needs power  WC she has difficulty doing ADLs, including getting to the bathroom on time without such safely. Pt having trouble navigating getting in and out of shower and power WC would be of great benefit with this task that [place] is of no benefit for.  In addition, patients diseases such that falls tend to occur backwards, and walkers do not help with this.  Pt has the mental capability to use the power WC and has demonstrated that with the PT in the past and I understand has already done this again (again, I am waiting for the PT evaluation, but I understand that this has occurred).  Pt cannot use manual WC because of endurance abilities to propel and poor coordination of UE's.  She cannot mount a scooter/transfer onto it safely, hence making inappropriate for this patient.  I would like her to have the ability to tilt and  recline to pressure relief as disease progresses, but insurance has thus far declined that feature, so at this point we would be happy with electric wheelchair alone.  Current prescribed movement disorder medications: Carbidopa/levodopa 25/100, 1 tablet 3 times per day. Zanaflex, 2 mg at bed   ALLERGIES:   Allergies  Allergen Reactions   Metformin And Related Diarrhea    CURRENT MEDICATIONS:  Outpatient Encounter Medications as of 12/21/2022  Medication Sig   AMBULATORY NON FORMULARY MEDICATION Please dispense 1 power wheelchair PSP patient DX code G23.1   aspirin EC 81 MG tablet Take 81 mg by mouth daily.   atorvastatin (LIPITOR) 10 MG tablet Take 1 tablet (10 mg total) by mouth daily.   Blood Glucose Monitoring Suppl (ACCU-CHEK GUIDE) w/Device KIT Use to check blood sugar TID. Dx E11.69   carbidopa-levodopa (SINEMET IR) 25-100 MG tablet TAKE 1 TABLET BY MOUTH THREE TIMES DAILY   diclofenac Sodium (VOLTAREN) 1 % GEL Apply 4 g topically 2 (two) times daily as needed.   glucose blood (ACCU-CHEK GUIDE) test strip USE AS DIRECTED THREE TIMES DAILY   insulin glargine (LANTUS SOLOSTAR) 100 UNIT/ML Solostar Pen Inject 16 Units into the skin daily.   Insulin Pen Needle (B-D ULTRAFINE III SHORT PEN) 31G X 8 MM MISC USE AS DIRECTED   JARDIANCE 25 MG TABS tablet TAKE 1 TABLET(25 MG) BY MOUTH DAILY   lidocaine (LIDODERM) 5 % Place 1 patch onto the skin daily. Remove & Discard patch within 12 hours or as directed by MD   losartan-hydrochlorothiazide (HYZAAR) 100-25 MG tablet TAKE 1/2 TABLET BY MOUTH DAILY   nitroGLYCERIN (NITROSTAT) 0.4 MG SL tablet Place 1 tablet (0.4 mg total) under the tongue every 5 (five) minutes as needed for chest pain.   pantoprazole (PROTONIX) 40 MG tablet TAKE 1 TABLET BY MOUTH DAILY AT NOON   [DISCONTINUED] simvastatin (ZOCOR) 10 MG tablet Take 10 mg by mouth at bedtime.   No facility-administered encounter medications on file as of 12/21/2022.    Objective:    PHYSICAL EXAMINATION:    VITALS:   There were no vitals filed for this visit.  GEN:  The patient appears stated age and is in NAD.  Neurological examination:  Orientation: The patient is alert and oriented x3.  Cranial nerves: There is good facial symmetry. There is facial hypomimia.  Extraocular muscles are intact.  there is some eye opening apraxia.  Downgaze was just slightly decreased. No significant square wave jerks.  The speech is fluent and dysarthric. some stuttering (same as previous).  She has trouble with the gutteral sounds.   Abnormal movements: As  above, she has some eyelid opening apraxia.  She has some pulling of the right face intermittently.  I have reviewed and interpreted the following labs independently    Chemistry      Component Value Date/Time   NA 144 11/02/2022 1116   K 4.4 11/02/2022 1116   CL 103 11/02/2022 1116   CO2 26 11/02/2022 1116   BUN 18 11/02/2022 1116   CREATININE 1.19 (H) 11/02/2022 1116      Component Value Date/Time   CALCIUM 10.0 11/02/2022 1116   ALKPHOS 82 11/02/2022 1116   AST 14 11/02/2022 1116   ALT 9 11/02/2022 1116   BILITOT 0.4 11/02/2022 1116       Lab Results  Component Value Date   WBC 5.4 04/21/2022   HGB 13.4 04/21/2022   HCT 40.7 04/21/2022   MCV 93.3 04/21/2022   PLT 253 04/21/2022    Lab Results  Component Value Date   TSH 0.984 07/23/2021   Lab Results  Component Value Date   ESRSEDRATE 32 11/23/2021   Lab Results  Component Value Date   CRP 7 11/23/2021   Follow up Instructions      -I discussed the assessment and treatment plan with the patient. The patient was provided an opportunity to ask questions and all were answered. The patient agreed with the plan and demonstrated an understanding of the instructions.   The patient was advised to call back or seek an in-person evaluation if the symptoms worsen or if the condition fails to improve as anticipated.    Total time spent on today's  visit was ***minutes, including both face-to-face time and nonface-to-face time.  Time included that spent on review of records (prior notes available to me/labs/imaging if pertinent), discussing treatment and goals, answering patient's questions and coordinating care.   Kerin Salen, DO   Cc:  Marcine Matar, MD

## 2022-12-20 NOTE — Progress Notes (Signed)
Modified Barium Swallow Study  Patient Details  Name: Kristi Campos MRN: [id number] Date of Birth: November 09, 1950  Today's Date: 12/20/2022  Modified Barium Swallow completed.  Full report located under Chart Review in the Imaging Section.  History of Present Illness Kristi Campos is a 72 y.o. female with PMH: GERD, HTN, DM, PSP (followed by neurology,Dr. Tat). Underwent MBS 05/11/22 with findings of moderately impaired oral phase, mild impairments pharyngeal phase. Noted to have disorganized manipulation of POs, no aspiration, esophageal stasis. Pt returns today for repeat OP MBS - she reports some changes in swallowing since March. Her dtr was present for the study.   Clinical Impression Pt presents with swallow function that is generally consistent with performance described in March 2024 study.  There is persisting oral impairment marked by disorganized and repetitive lingual movements with eventual propulsion of solids into pharynx and premature spillage of liquids. Pharyngeal swallow, with liquids, is triggered at the pyriforms. There is consistent transient penetration of thin liquids into the laryngeal vestibule, but it is ejected from the larynx upon completion of the swallow. There is no observed aspiration. No pharyngeal residue. Pt/daughter report eating soft foods.  Recommend continuing current diet; take small sips/bites; allow plenty of time for meals.  Pt was unable to propel the pill into her pharynx - her dtr reports she is able to do so at home.  Pt may benefit from SLP to address oral manipulation/control. D/W pt/dtr. Factors that may increase risk of adverse event in presence of aspiration Kristi Campos):    Swallow Evaluation Recommendations Recommendations: PO diet PO Diet Recommendation: Dysphagia 3 (Mechanical soft);Thin liquids (Level 0) Liquid Administration via: Cup;Straw Medication Administration: Whole meds with liquid Supervision: Patient able to  self-feed Swallowing strategies  : Slow rate;Small bites/sips     Kristi Campos Frederic, MA CCC/SLP Clinical Specialist - Acute Care SLP Acute Rehabilitation Services Office number [phone]  Kristi Campos 12/20/2022,1:42 PM

## 2022-12-21 ENCOUNTER — Telehealth: Payer: Self-pay

## 2022-12-21 ENCOUNTER — Telehealth (INDEPENDENT_AMBULATORY_CARE_PROVIDER_SITE_OTHER): Payer: 59 | Admitting: Neurology

## 2022-12-21 DIAGNOSIS — G231 Progressive supranuclear ophthalmoplegia [Steele-Richardson-Olszewski]: Secondary | ICD-10-CM

## 2022-12-21 NOTE — Telephone Encounter (Signed)
Reached out to [employer] and mobility they are sending paperwork for wheelchair eval

## 2022-12-22 ENCOUNTER — Telehealth: Payer: Self-pay | Admitting: Internal Medicine

## 2022-12-22 DIAGNOSIS — M545 Low back pain, unspecified: Secondary | ICD-10-CM | POA: Diagnosis not present

## 2022-12-22 DIAGNOSIS — Z7984 Long term (current) use of oral hypoglycemic drugs: Secondary | ICD-10-CM | POA: Diagnosis not present

## 2022-12-22 DIAGNOSIS — E1122 Type 2 diabetes mellitus with diabetic chronic kidney disease: Secondary | ICD-10-CM | POA: Diagnosis not present

## 2022-12-22 DIAGNOSIS — N1831 Chronic kidney disease, stage 3a: Secondary | ICD-10-CM | POA: Diagnosis not present

## 2022-12-22 DIAGNOSIS — R1312 Dysphagia, oropharyngeal phase: Secondary | ICD-10-CM | POA: Diagnosis not present

## 2022-12-22 DIAGNOSIS — E1151 Type 2 diabetes mellitus with diabetic peripheral angiopathy without gangrene: Secondary | ICD-10-CM | POA: Diagnosis not present

## 2022-12-22 DIAGNOSIS — M19031 Primary osteoarthritis, right wrist: Secondary | ICD-10-CM | POA: Diagnosis not present

## 2022-12-22 DIAGNOSIS — H409 Unspecified glaucoma: Secondary | ICD-10-CM | POA: Diagnosis not present

## 2022-12-22 DIAGNOSIS — Z794 Long term (current) use of insulin: Secondary | ICD-10-CM | POA: Diagnosis not present

## 2022-12-22 DIAGNOSIS — E1159 Type 2 diabetes mellitus with other circulatory complications: Secondary | ICD-10-CM | POA: Diagnosis not present

## 2022-12-22 DIAGNOSIS — E785 Hyperlipidemia, unspecified: Secondary | ICD-10-CM | POA: Diagnosis not present

## 2022-12-22 DIAGNOSIS — E1169 Type 2 diabetes mellitus with other specified complication: Secondary | ICD-10-CM | POA: Diagnosis not present

## 2022-12-22 DIAGNOSIS — G231 Progressive supranuclear ophthalmoplegia [Steele-Richardson-Olszewski]: Secondary | ICD-10-CM | POA: Diagnosis not present

## 2022-12-22 DIAGNOSIS — I152 Hypertension secondary to endocrine disorders: Secondary | ICD-10-CM | POA: Diagnosis not present

## 2022-12-22 DIAGNOSIS — R471 Dysarthria and anarthria: Secondary | ICD-10-CM | POA: Diagnosis not present

## 2022-12-22 DIAGNOSIS — Z7982 Long term (current) use of aspirin: Secondary | ICD-10-CM | POA: Diagnosis not present

## 2022-12-22 DIAGNOSIS — G8929 Other chronic pain: Secondary | ICD-10-CM | POA: Diagnosis not present

## 2022-12-22 DIAGNOSIS — Z9181 History of falling: Secondary | ICD-10-CM | POA: Diagnosis not present

## 2022-12-22 NOTE — Telephone Encounter (Signed)
Phone call placed to patient's daughter [name] this a.m.   I went over the modified barium swallow evaluation.  It did not show much change from the one that she had done back in March.  It is recommended that patient continues soft diet/thin liquids and provide plenty of time for eating.  Advised to chew slowly and take small sips/okay to use straw.  SLP therapy was also recommended address oral manipulation/control.  Patient's daughter indicated that she is already seeing a speech therapist who comes out twice a week to the house.  All questions were answered.

## 2022-12-27 ENCOUNTER — Telehealth: Payer: Self-pay | Admitting: Internal Medicine

## 2022-12-27 DIAGNOSIS — Z9181 History of falling: Secondary | ICD-10-CM | POA: Diagnosis not present

## 2022-12-27 DIAGNOSIS — M19031 Primary osteoarthritis, right wrist: Secondary | ICD-10-CM | POA: Diagnosis not present

## 2022-12-27 DIAGNOSIS — Z7984 Long term (current) use of oral hypoglycemic drugs: Secondary | ICD-10-CM | POA: Diagnosis not present

## 2022-12-27 DIAGNOSIS — E1151 Type 2 diabetes mellitus with diabetic peripheral angiopathy without gangrene: Secondary | ICD-10-CM | POA: Diagnosis not present

## 2022-12-27 DIAGNOSIS — N1831 Chronic kidney disease, stage 3a: Secondary | ICD-10-CM | POA: Diagnosis not present

## 2022-12-27 DIAGNOSIS — E1122 Type 2 diabetes mellitus with diabetic chronic kidney disease: Secondary | ICD-10-CM | POA: Diagnosis not present

## 2022-12-27 DIAGNOSIS — Z794 Long term (current) use of insulin: Secondary | ICD-10-CM | POA: Diagnosis not present

## 2022-12-27 DIAGNOSIS — I152 Hypertension secondary to endocrine disorders: Secondary | ICD-10-CM | POA: Diagnosis not present

## 2022-12-27 DIAGNOSIS — G231 Progressive supranuclear ophthalmoplegia [Steele-Richardson-Olszewski]: Secondary | ICD-10-CM | POA: Diagnosis not present

## 2022-12-27 DIAGNOSIS — Z7982 Long term (current) use of aspirin: Secondary | ICD-10-CM | POA: Diagnosis not present

## 2022-12-27 DIAGNOSIS — H409 Unspecified glaucoma: Secondary | ICD-10-CM | POA: Diagnosis not present

## 2022-12-27 DIAGNOSIS — R1312 Dysphagia, oropharyngeal phase: Secondary | ICD-10-CM | POA: Diagnosis not present

## 2022-12-27 DIAGNOSIS — E1169 Type 2 diabetes mellitus with other specified complication: Secondary | ICD-10-CM | POA: Diagnosis not present

## 2022-12-27 DIAGNOSIS — E1159 Type 2 diabetes mellitus with other circulatory complications: Secondary | ICD-10-CM | POA: Diagnosis not present

## 2022-12-27 DIAGNOSIS — G8929 Other chronic pain: Secondary | ICD-10-CM | POA: Diagnosis not present

## 2022-12-27 DIAGNOSIS — M545 Low back pain, unspecified: Secondary | ICD-10-CM | POA: Diagnosis not present

## 2022-12-27 DIAGNOSIS — E785 Hyperlipidemia, unspecified: Secondary | ICD-10-CM | POA: Diagnosis not present

## 2022-12-27 DIAGNOSIS — R471 Dysarthria and anarthria: Secondary | ICD-10-CM | POA: Diagnosis not present

## 2022-12-27 NOTE — Telephone Encounter (Signed)
Kristi Campos calling from [facility] is calling to report that the pt had a fall on Saturday. No injuries or pain reported. CB- [phone]

## 2022-12-27 NOTE — Telephone Encounter (Signed)
noted 

## 2022-12-29 DIAGNOSIS — R471 Dysarthria and anarthria: Secondary | ICD-10-CM | POA: Diagnosis not present

## 2022-12-29 DIAGNOSIS — I152 Hypertension secondary to endocrine disorders: Secondary | ICD-10-CM | POA: Diagnosis not present

## 2022-12-29 DIAGNOSIS — Z9181 History of falling: Secondary | ICD-10-CM | POA: Diagnosis not present

## 2022-12-29 DIAGNOSIS — G231 Progressive supranuclear ophthalmoplegia [Steele-Richardson-Olszewski]: Secondary | ICD-10-CM | POA: Diagnosis not present

## 2022-12-29 DIAGNOSIS — E785 Hyperlipidemia, unspecified: Secondary | ICD-10-CM | POA: Diagnosis not present

## 2022-12-29 DIAGNOSIS — Z7984 Long term (current) use of oral hypoglycemic drugs: Secondary | ICD-10-CM | POA: Diagnosis not present

## 2022-12-29 DIAGNOSIS — E1159 Type 2 diabetes mellitus with other circulatory complications: Secondary | ICD-10-CM | POA: Diagnosis not present

## 2022-12-29 DIAGNOSIS — Z7982 Long term (current) use of aspirin: Secondary | ICD-10-CM | POA: Diagnosis not present

## 2022-12-29 DIAGNOSIS — M545 Low back pain, unspecified: Secondary | ICD-10-CM | POA: Diagnosis not present

## 2022-12-29 DIAGNOSIS — M19031 Primary osteoarthritis, right wrist: Secondary | ICD-10-CM | POA: Diagnosis not present

## 2022-12-29 DIAGNOSIS — E1169 Type 2 diabetes mellitus with other specified complication: Secondary | ICD-10-CM | POA: Diagnosis not present

## 2022-12-29 DIAGNOSIS — N1831 Chronic kidney disease, stage 3a: Secondary | ICD-10-CM | POA: Diagnosis not present

## 2022-12-29 DIAGNOSIS — G8929 Other chronic pain: Secondary | ICD-10-CM | POA: Diagnosis not present

## 2022-12-29 DIAGNOSIS — E1122 Type 2 diabetes mellitus with diabetic chronic kidney disease: Secondary | ICD-10-CM | POA: Diagnosis not present

## 2022-12-29 DIAGNOSIS — Z794 Long term (current) use of insulin: Secondary | ICD-10-CM | POA: Diagnosis not present

## 2022-12-29 DIAGNOSIS — H409 Unspecified glaucoma: Secondary | ICD-10-CM | POA: Diagnosis not present

## 2022-12-29 DIAGNOSIS — E1151 Type 2 diabetes mellitus with diabetic peripheral angiopathy without gangrene: Secondary | ICD-10-CM | POA: Diagnosis not present

## 2022-12-29 DIAGNOSIS — R1312 Dysphagia, oropharyngeal phase: Secondary | ICD-10-CM | POA: Diagnosis not present

## 2022-12-30 ENCOUNTER — Other Ambulatory Visit: Payer: Self-pay

## 2022-12-31 ENCOUNTER — Telehealth: Payer: Self-pay | Admitting: Internal Medicine

## 2022-12-31 NOTE — Telephone Encounter (Signed)
Will forward to nurse 

## 2022-12-31 NOTE — Telephone Encounter (Signed)
FYI

## 2022-12-31 NOTE — Telephone Encounter (Signed)
Kristi Campos with [facility] called to say pt lost her balance when he was walking with her but he helped her lower to the ground and she did not get hurt.  CB@ [phone]

## 2023-01-03 DIAGNOSIS — Z7982 Long term (current) use of aspirin: Secondary | ICD-10-CM | POA: Diagnosis not present

## 2023-01-03 DIAGNOSIS — N1831 Chronic kidney disease, stage 3a: Secondary | ICD-10-CM | POA: Diagnosis not present

## 2023-01-03 DIAGNOSIS — Z7984 Long term (current) use of oral hypoglycemic drugs: Secondary | ICD-10-CM | POA: Diagnosis not present

## 2023-01-03 DIAGNOSIS — E1159 Type 2 diabetes mellitus with other circulatory complications: Secondary | ICD-10-CM | POA: Diagnosis not present

## 2023-01-03 DIAGNOSIS — E785 Hyperlipidemia, unspecified: Secondary | ICD-10-CM | POA: Diagnosis not present

## 2023-01-03 DIAGNOSIS — E1122 Type 2 diabetes mellitus with diabetic chronic kidney disease: Secondary | ICD-10-CM | POA: Diagnosis not present

## 2023-01-03 DIAGNOSIS — E1151 Type 2 diabetes mellitus with diabetic peripheral angiopathy without gangrene: Secondary | ICD-10-CM | POA: Diagnosis not present

## 2023-01-03 DIAGNOSIS — M545 Low back pain, unspecified: Secondary | ICD-10-CM | POA: Diagnosis not present

## 2023-01-03 DIAGNOSIS — R1312 Dysphagia, oropharyngeal phase: Secondary | ICD-10-CM | POA: Diagnosis not present

## 2023-01-03 DIAGNOSIS — R471 Dysarthria and anarthria: Secondary | ICD-10-CM | POA: Diagnosis not present

## 2023-01-03 DIAGNOSIS — Z9181 History of falling: Secondary | ICD-10-CM | POA: Diagnosis not present

## 2023-01-03 DIAGNOSIS — G8929 Other chronic pain: Secondary | ICD-10-CM | POA: Diagnosis not present

## 2023-01-03 DIAGNOSIS — M19031 Primary osteoarthritis, right wrist: Secondary | ICD-10-CM | POA: Diagnosis not present

## 2023-01-03 DIAGNOSIS — I152 Hypertension secondary to endocrine disorders: Secondary | ICD-10-CM | POA: Diagnosis not present

## 2023-01-03 DIAGNOSIS — E1169 Type 2 diabetes mellitus with other specified complication: Secondary | ICD-10-CM | POA: Diagnosis not present

## 2023-01-03 DIAGNOSIS — G231 Progressive supranuclear ophthalmoplegia [Steele-Richardson-Olszewski]: Secondary | ICD-10-CM | POA: Diagnosis not present

## 2023-01-03 DIAGNOSIS — H409 Unspecified glaucoma: Secondary | ICD-10-CM | POA: Diagnosis not present

## 2023-01-03 DIAGNOSIS — Z794 Long term (current) use of insulin: Secondary | ICD-10-CM | POA: Diagnosis not present

## 2023-01-04 ENCOUNTER — Other Ambulatory Visit: Payer: Self-pay

## 2023-01-04 ENCOUNTER — Encounter (HOSPITAL_COMMUNITY): Payer: Self-pay | Admitting: Emergency Medicine

## 2023-01-04 ENCOUNTER — Emergency Department (HOSPITAL_COMMUNITY): Payer: 59

## 2023-01-04 ENCOUNTER — Emergency Department (HOSPITAL_COMMUNITY)
Admission: EM | Admit: 2023-01-04 | Discharge: 2023-01-04 | Disposition: A | Discharge status: Home / Self Care | Payer: 59 | Attending: Emergency Medicine | Admitting: Emergency Medicine

## 2023-01-04 DIAGNOSIS — R609 Edema, unspecified: Secondary | ICD-10-CM | POA: Diagnosis not present

## 2023-01-04 DIAGNOSIS — Z79899 Other long term (current) drug therapy: Secondary | ICD-10-CM | POA: Insufficient documentation

## 2023-01-04 DIAGNOSIS — E119 Type 2 diabetes mellitus without complications: Secondary | ICD-10-CM | POA: Diagnosis not present

## 2023-01-04 DIAGNOSIS — I10 Essential (primary) hypertension: Secondary | ICD-10-CM | POA: Insufficient documentation

## 2023-01-04 DIAGNOSIS — W1830XA Fall on same level, unspecified, initial encounter: Secondary | ICD-10-CM | POA: Insufficient documentation

## 2023-01-04 DIAGNOSIS — Z794 Long term (current) use of insulin: Secondary | ICD-10-CM | POA: Insufficient documentation

## 2023-01-04 DIAGNOSIS — S0990XA Unspecified injury of head, initial encounter: Secondary | ICD-10-CM | POA: Diagnosis present

## 2023-01-04 DIAGNOSIS — I6523 Occlusion and stenosis of bilateral carotid arteries: Secondary | ICD-10-CM | POA: Diagnosis not present

## 2023-01-04 DIAGNOSIS — G4489 Other headache syndrome: Secondary | ICD-10-CM | POA: Diagnosis not present

## 2023-01-04 DIAGNOSIS — R519 Headache, unspecified: Secondary | ICD-10-CM | POA: Diagnosis not present

## 2023-01-04 DIAGNOSIS — Z7982 Long term (current) use of aspirin: Secondary | ICD-10-CM | POA: Insufficient documentation

## 2023-01-04 DIAGNOSIS — W19XXXA Unspecified fall, initial encounter: Secondary | ICD-10-CM

## 2023-01-04 DIAGNOSIS — Y92009 Unspecified place in unspecified non-institutional (private) residence as the place of occurrence of the external cause: Secondary | ICD-10-CM | POA: Insufficient documentation

## 2023-01-04 DIAGNOSIS — S0083XA Contusion of other part of head, initial encounter: Secondary | ICD-10-CM | POA: Diagnosis not present

## 2023-01-04 NOTE — ED Provider Notes (Signed)
[place] EMERGENCY DEPARTMENT AT [facility] Provider Note   CSN: [id number] Arrival date & time: 01/04/23  1411     History  Chief Complaint  Patient presents with   Cassell Smiles Windhorst is a 72 y.o. female.  Patient is a 72 year old female with a history of hypertension, diabetes, ongoing gait and balance problems who uses a Juenger presenting today after a fall at home.  Patient reports that she had her Souder to her side and she was turning and got her feet twisted causing her to fall and hit the left side of her head on the floor.  She denies any loss of consciousness.  She denies any neck pain or pain in her arms or legs.  She does not take any anticoagulation.  She reports otherwise feeling fine.  Last fall was approximately 2 weeks ago and she reports she does fall frequently.  The history is provided by the patient.  Fall       Home Medications Prior to Admission medications   Medication Sig Start Date End Date Taking? 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    [provider]  atorvastatin (LIPITOR) 10 MG tablet Take 1 tablet (10 mg total) by mouth daily. 11/02/22   Marcine Matar, MD  Blood Glucose Monitoring Suppl (ACCU-CHEK GUIDE) w/Device KIT Use to check blood sugar TID. Dx E11.69 10/30/21   Marcine Matar, MD  carbidopa-levodopa (SINEMET IR) 25-100 MG tablet TAKE 1 TABLET BY MOUTH THREE TIMES DAILY 05/17/22   Tat, Octaviano Batty, DO  diclofenac Sodium (VOLTAREN) 1 % GEL Apply 4 g topically 2 (two) times daily as needed. 10/04/22   Hoy Register, MD  glucose blood (ACCU-CHEK GUIDE) test strip USE AS DIRECTED THREE TIMES DAILY 07/05/22   Marcine Matar, MD  insulin glargine (LANTUS SOLOSTAR) 100 UNIT/ML Solostar Pen Inject 16 Units into the skin daily. 12/09/22   Marcine Matar, MD   Insulin Pen Needle (B-D ULTRAFINE III SHORT PEN) 31G X 8 MM MISC USE AS DIRECTED 11/03/22   Marcine Matar, MD  JARDIANCE 25 MG TABS tablet TAKE 1 TABLET(25 MG) BY MOUTH DAILY 12/02/22   Marcine Matar, MD  lidocaine (LIDODERM) 5 % Place 1 patch onto the skin daily. Remove & Discard patch within 12 hours or as directed by MD 10/04/22   Hoy Register, MD  losartan-hydrochlorothiazide (HYZAAR) 100-25 MG tablet TAKE 1/2 TABLET BY MOUTH DAILY 11/02/22   Marcine Matar, MD  nitroGLYCERIN (NITROSTAT) 0.4 MG SL tablet Place 1 tablet (0.4 mg total) under the tongue every 5 (five) minutes as needed for chest pain. 10/18/20   Osvaldo Shipper, MD  pantoprazole (PROTONIX) 40 MG tablet TAKE 1 TABLET BY MOUTH DAILY AT NOON 04/22/22   Marcine Matar, MD  simvastatin (ZOCOR) 10 MG tablet Take 10 mg by mouth at bedtime. 01/01/19 02/28/20  [provider]      Allergies    Metformin and related    Review of Systems   Review of Systems  Physical Exam Updated Vital Signs BP (!) 143/94 (BP Location: Right Arm)   Pulse 70   Temp 98.1 F (36.7 C) (Oral)   Resp 20   SpO2 100%  Physical Exam Vitals and nursing note reviewed.  Constitutional:  General: She is not in acute distress.    Appearance: She is well-developed.  HENT:     Head: Normocephalic.   Eyes:     Pupils: Pupils are equal, round, and reactive to light.  Cardiovascular:     Rate and Rhythm: Normal rate and regular rhythm.     Heart sounds: Normal heart sounds. No murmur heard.    No friction rub.  Pulmonary:     Effort: Pulmonary effort is normal.     Breath sounds: Normal breath sounds. No wheezing or rales.  Abdominal:     General: Bowel sounds are normal. There is no distension.     Palpations: Abdomen is soft.     Tenderness: There is no abdominal tenderness. There is no guarding or rebound.  Musculoskeletal:        General: No tenderness. Normal range of motion.     Cervical back: No spinous process  tenderness or muscular tenderness.     Comments: No edema.  Able to fully range shoulders, elbows, wrists, hips and knees independently and spontaneously without any pain bilaterally.  Skin:    General: Skin is warm and dry.     Findings: No rash.  Neurological:     Mental Status: She is alert and oriented to person, place, and time.     Cranial Nerves: No cranial nerve deficit.     Sensory: No sensory deficit.     Motor: No weakness.  Psychiatric:        Behavior: Behavior normal.     ED Results / Procedures / Treatments   Labs (all labs ordered are listed, but only abnormal results are displayed) Labs Reviewed - No data to display  EKG None  Radiology CT Head Wo Contrast  Result Date: 01/04/2023 CLINICAL DATA:  Fall, pain EXAM: CT HEAD WITHOUT CONTRAST CT MAXILLOFACIAL WITHOUT CONTRAST CT CERVICAL SPINE WITHOUT CONTRAST TECHNIQUE: Multidetector CT imaging of the head, cervical spine, and maxillofacial structures were performed using the standard protocol without intravenous contrast. Multiplanar CT image reconstructions of the cervical spine and maxillofacial structures were also generated. RADIATION DOSE REDUCTION: This exam was performed according to the departmental dose-optimization program which includes automated exposure control, adjustment of the mA and/or kV according to patient size and/or use of iterative reconstruction technique. COMPARISON:  CT head 08/07/2022, cervical spine CT 08/31/2021 FINDINGS: CT HEAD FINDINGS Brain: There is no acute intracranial hemorrhage, extra-axial fluid collection, or acute infarct. Parenchymal volume is normal. The ventricles are normal in size. Gray-white differentiation is preserved. The pituitary and suprasellar region are normal. There is no mass lesion. There is no mass effect or midline shift. Vascular: There is calcification of the bilateral carotid siphons. Skull: Normal. Negative for fracture or focal lesion. Other: The mastoid air  cells and middle ear cavities are clear. CT MAXILLOFACIAL FINDINGS Osseous: There is no acute facial bone fracture. There is no evidence of mandibular dislocation. There is no suspicious osseous lesion. Orbits: The globes and orbits are unremarkable. Sinuses: Clear. Soft tissues: Unremarkable. CT CERVICAL SPINE FINDINGS Alignment: Normal. Skull base and vertebrae: Skull base alignment is maintained. Vertebral body heights are preserved. There is no evidence of acute fracture. There is no suspicious osseous lesion. Soft tissues and spinal canal: No prevertebral fluid or swelling. No visible canal hematoma. Disc levels: There is a minimal degenerative endplate change and facet arthropathy in the cervical spine. There is no evidence of high-grade spinal canal or neural foraminal stenosis. Upper chest: The imaged right lung apex  is clear. Other: None. IMPRESSION: 1. No acute intracranial pathology. 2. No acute facial bone fracture. 3. No acute fracture or traumatic malalignment of the cervical spine. Electronically Signed   By: Lesia Hausen M.D.   On: 01/04/2023 17:04   CT Cervical Spine Wo Contrast  Result Date: 01/04/2023 CLINICAL DATA:  Fall, pain EXAM: CT HEAD WITHOUT CONTRAST CT MAXILLOFACIAL WITHOUT CONTRAST CT CERVICAL SPINE WITHOUT CONTRAST TECHNIQUE: Multidetector CT imaging of the head, cervical spine, and maxillofacial structures were performed using the standard protocol without intravenous contrast. Multiplanar CT image reconstructions of the cervical spine and maxillofacial structures were also generated. RADIATION DOSE REDUCTION: This exam was performed according to the departmental dose-optimization program which includes automated exposure control, adjustment of the mA and/or kV according to patient size and/or use of iterative reconstruction technique. COMPARISON:  CT head 08/07/2022, cervical spine CT 08/31/2021 FINDINGS: CT HEAD FINDINGS Brain: There is no acute intracranial hemorrhage,  extra-axial fluid collection, or acute infarct. Parenchymal volume is normal. The ventricles are normal in size. Gray-white differentiation is preserved. The pituitary and suprasellar region are normal. There is no mass lesion. There is no mass effect or midline shift. Vascular: There is calcification of the bilateral carotid siphons. Skull: Normal. Negative for fracture or focal lesion. Other: The mastoid air cells and middle ear cavities are clear. CT MAXILLOFACIAL FINDINGS Osseous: There is no acute facial bone fracture. There is no evidence of mandibular dislocation. There is no suspicious osseous lesion. Orbits: The globes and orbits are unremarkable. Sinuses: Clear. Soft tissues: Unremarkable. CT CERVICAL SPINE FINDINGS Alignment: Normal. Skull base and vertebrae: Skull base alignment is maintained. Vertebral body heights are preserved. There is no evidence of acute fracture. There is no suspicious osseous lesion. Soft tissues and spinal canal: No prevertebral fluid or swelling. No visible canal hematoma. Disc levels: There is a minimal degenerative endplate change and facet arthropathy in the cervical spine. There is no evidence of high-grade spinal canal or neural foraminal stenosis. Upper chest: The imaged right lung apex is clear. Other: None. IMPRESSION: 1. No acute intracranial pathology. 2. No acute facial bone fracture. 3. No acute fracture or traumatic malalignment of the cervical spine. Electronically Signed   By: Lesia Hausen M.D.   On: 01/04/2023 17:04   CT Maxillofacial WO CM  Result Date: 01/04/2023 CLINICAL DATA:  Fall, pain EXAM: CT HEAD WITHOUT CONTRAST CT MAXILLOFACIAL WITHOUT CONTRAST CT CERVICAL SPINE WITHOUT CONTRAST TECHNIQUE: Multidetector CT imaging of the head, cervical spine, and maxillofacial structures were performed using the standard protocol without intravenous contrast. Multiplanar CT image reconstructions of the cervical spine and maxillofacial structures were also  generated. RADIATION DOSE REDUCTION: This exam was performed according to the departmental dose-optimization program which includes automated exposure control, adjustment of the mA and/or kV according to patient size and/or use of iterative reconstruction technique. COMPARISON:  CT head 08/07/2022, cervical spine CT 08/31/2021 FINDINGS: CT HEAD FINDINGS Brain: There is no acute intracranial hemorrhage, extra-axial fluid collection, or acute infarct. Parenchymal volume is normal. The ventricles are normal in size. Gray-white differentiation is preserved. The pituitary and suprasellar region are normal. There is no mass lesion. There is no mass effect or midline shift. Vascular: There is calcification of the bilateral carotid siphons. Skull: Normal. Negative for fracture or focal lesion. Other: The mastoid air cells and middle ear cavities are clear. CT MAXILLOFACIAL FINDINGS Osseous: There is no acute facial bone fracture. There is no evidence of mandibular dislocation. There is no suspicious osseous lesion. Orbits:  The globes and orbits are unremarkable. Sinuses: Clear. Soft tissues: Unremarkable. CT CERVICAL SPINE FINDINGS Alignment: Normal. Skull base and vertebrae: Skull base alignment is maintained. Vertebral body heights are preserved. There is no evidence of acute fracture. There is no suspicious osseous lesion. Soft tissues and spinal canal: No prevertebral fluid or swelling. No visible canal hematoma. Disc levels: There is a minimal degenerative endplate change and facet arthropathy in the cervical spine. There is no evidence of high-grade spinal canal or neural foraminal stenosis. Upper chest: The imaged right lung apex is clear. Other: None. IMPRESSION: 1. No acute intracranial pathology. 2. No acute facial bone fracture. 3. No acute fracture or traumatic malalignment of the cervical spine. Electronically Signed   By: Lesia Hausen M.D.   On: 01/04/2023 17:04    Procedures Procedures    Medications  Ordered in ED Medications - No data to display  ED Course/ Medical Decision Making/ A&P                                 Medical Decision Making Amount and/or Complexity of Data Reviewed Radiology: ordered and independent interpretation performed. Decision-making details documented in ED Course.  Pt with multiple medical problems and comorbidities and presenting today with a complaint that caries a high risk for morbidity and mortality.  Here today after a fall at home.  Patient takes no anticoagulation complains of pain over the left side of her eyebrow where she fell and hit her head today but no other complaints.  Patient unfortunately has been waiting for 7 hours but mental status has not changed.  She is in no acute distress at this time.  Is able to move arms and legs without any difficulty.  I have independently visualized and interpreted pt's images today.  CT today without evidence of intracranial bleed.  Cervical spine and face are negative.  Radiology reports no acute findings.  At this time feel that patient is stable to go home.  She lives with her grandson and states that he can come and pick her up.  No indication for further testing or admission today.         Final Clinical Impression(s) / ED Diagnoses Final diagnoses:  Fall, initial encounter  Contusion of face, initial encounter    Rx / DC Orders ED Discharge Orders     None         Gwyneth Sprout, MD 01/04/23 2116

## 2023-01-04 NOTE — Discharge Instructions (Addendum)
No internal bleeding or any broken bones today.  Sure you using your Heacox at all times to prevent falls in the future.

## 2023-01-04 NOTE — ED Triage Notes (Addendum)
Report received from [facility].  Pt from home.  Pt loss balance and fell on her face.  C/o headache and abrasion above L eyebrow.  Reports history of difficulty walking and stuttering.  No arm or leg weakness.  Denies LOC.  No blood thinners.  Alert and oriented.

## 2023-01-04 NOTE — ED Provider Triage Note (Signed)
Emergency Medicine Provider Triage Evaluation Note  Kristi Campos , a 72 y.o. female  was evaluated in triage.  Pt complains of pain in her head following a fall.  She relates a mechanical fall, getting her feet tangled, falling and striking her left forehead.  No loss of consciousness, no subsequent weakness in any extremity, fall occurred a few hours ago..  Review of Systems  Positive: Head pain Negative: New weakness in her extremities  Physical Exam  BP 136/88   Pulse 83   Temp 98.3 F (36.8 C)   Resp 16   SpO2 99%  Gen:   Awake, no distress speaking clearly Resp:  Normal effort no increased work of breathing MSK:   Moves extremities without difficulty no deformities Other:  Face/head with hematoma left superior brow with 1 cm hemostatic laceration.  Medical Decision Making  Medically screening exam initiated at 3:57 PM.  Appropriate orders placed.  Kristi Campos was informed that the remainder of the evaluation will be completed by another provider, this initial triage assessment does not replace that evaluation, and the importance of remaining in the ED until their evaluation is complete.   Gerhard Munch, MD 01/04/23 [phone]

## 2023-01-05 ENCOUNTER — Telehealth: Payer: Self-pay | Admitting: Neurology

## 2023-01-05 DIAGNOSIS — G231 Progressive supranuclear ophthalmoplegia [Steele-Richardson-Olszewski]: Secondary | ICD-10-CM | POA: Diagnosis not present

## 2023-01-05 DIAGNOSIS — G8929 Other chronic pain: Secondary | ICD-10-CM | POA: Diagnosis not present

## 2023-01-05 DIAGNOSIS — Z794 Long term (current) use of insulin: Secondary | ICD-10-CM | POA: Diagnosis not present

## 2023-01-05 DIAGNOSIS — Z9181 History of falling: Secondary | ICD-10-CM | POA: Diagnosis not present

## 2023-01-05 DIAGNOSIS — E785 Hyperlipidemia, unspecified: Secondary | ICD-10-CM | POA: Diagnosis not present

## 2023-01-05 DIAGNOSIS — E1122 Type 2 diabetes mellitus with diabetic chronic kidney disease: Secondary | ICD-10-CM | POA: Diagnosis not present

## 2023-01-05 DIAGNOSIS — E1169 Type 2 diabetes mellitus with other specified complication: Secondary | ICD-10-CM | POA: Diagnosis not present

## 2023-01-05 DIAGNOSIS — I152 Hypertension secondary to endocrine disorders: Secondary | ICD-10-CM | POA: Diagnosis not present

## 2023-01-05 DIAGNOSIS — H409 Unspecified glaucoma: Secondary | ICD-10-CM | POA: Diagnosis not present

## 2023-01-05 DIAGNOSIS — Z7982 Long term (current) use of aspirin: Secondary | ICD-10-CM | POA: Diagnosis not present

## 2023-01-05 DIAGNOSIS — M19031 Primary osteoarthritis, right wrist: Secondary | ICD-10-CM | POA: Diagnosis not present

## 2023-01-05 DIAGNOSIS — E1151 Type 2 diabetes mellitus with diabetic peripheral angiopathy without gangrene: Secondary | ICD-10-CM | POA: Diagnosis not present

## 2023-01-05 DIAGNOSIS — Z7984 Long term (current) use of oral hypoglycemic drugs: Secondary | ICD-10-CM | POA: Diagnosis not present

## 2023-01-05 DIAGNOSIS — R1312 Dysphagia, oropharyngeal phase: Secondary | ICD-10-CM | POA: Diagnosis not present

## 2023-01-05 DIAGNOSIS — R471 Dysarthria and anarthria: Secondary | ICD-10-CM | POA: Diagnosis not present

## 2023-01-05 DIAGNOSIS — E1159 Type 2 diabetes mellitus with other circulatory complications: Secondary | ICD-10-CM | POA: Diagnosis not present

## 2023-01-05 DIAGNOSIS — M545 Low back pain, unspecified: Secondary | ICD-10-CM | POA: Diagnosis not present

## 2023-01-05 DIAGNOSIS — N1831 Chronic kidney disease, stage 3a: Secondary | ICD-10-CM | POA: Diagnosis not present

## 2023-01-05 NOTE — Telephone Encounter (Signed)
Caller would like to know update regarding power wheel chair for her mother. Stated her mother fell again yesterday

## 2023-01-05 NOTE — Telephone Encounter (Signed)
All paperwork has been submitted to [employer] and [employer]. Called patients daughter and let her know . Patient has fallen but did go to ED and got three ct scans of the head and face

## 2023-01-14 DIAGNOSIS — G8929 Other chronic pain: Secondary | ICD-10-CM | POA: Diagnosis not present

## 2023-01-14 DIAGNOSIS — E1122 Type 2 diabetes mellitus with diabetic chronic kidney disease: Secondary | ICD-10-CM | POA: Diagnosis not present

## 2023-01-14 DIAGNOSIS — G231 Progressive supranuclear ophthalmoplegia [Steele-Richardson-Olszewski]: Secondary | ICD-10-CM | POA: Diagnosis not present

## 2023-01-14 DIAGNOSIS — E1169 Type 2 diabetes mellitus with other specified complication: Secondary | ICD-10-CM | POA: Diagnosis not present

## 2023-01-14 DIAGNOSIS — H409 Unspecified glaucoma: Secondary | ICD-10-CM | POA: Diagnosis not present

## 2023-01-14 DIAGNOSIS — Z794 Long term (current) use of insulin: Secondary | ICD-10-CM | POA: Diagnosis not present

## 2023-01-14 DIAGNOSIS — N1831 Chronic kidney disease, stage 3a: Secondary | ICD-10-CM | POA: Diagnosis not present

## 2023-01-14 DIAGNOSIS — Z9181 History of falling: Secondary | ICD-10-CM | POA: Diagnosis not present

## 2023-01-14 DIAGNOSIS — R1312 Dysphagia, oropharyngeal phase: Secondary | ICD-10-CM | POA: Diagnosis not present

## 2023-01-14 DIAGNOSIS — E785 Hyperlipidemia, unspecified: Secondary | ICD-10-CM | POA: Diagnosis not present

## 2023-01-14 DIAGNOSIS — I152 Hypertension secondary to endocrine disorders: Secondary | ICD-10-CM | POA: Diagnosis not present

## 2023-01-14 DIAGNOSIS — Z7982 Long term (current) use of aspirin: Secondary | ICD-10-CM | POA: Diagnosis not present

## 2023-01-14 DIAGNOSIS — E1151 Type 2 diabetes mellitus with diabetic peripheral angiopathy without gangrene: Secondary | ICD-10-CM | POA: Diagnosis not present

## 2023-01-14 DIAGNOSIS — E1159 Type 2 diabetes mellitus with other circulatory complications: Secondary | ICD-10-CM | POA: Diagnosis not present

## 2023-01-14 DIAGNOSIS — M545 Low back pain, unspecified: Secondary | ICD-10-CM | POA: Diagnosis not present

## 2023-01-14 DIAGNOSIS — M19031 Primary osteoarthritis, right wrist: Secondary | ICD-10-CM | POA: Diagnosis not present

## 2023-01-14 DIAGNOSIS — R471 Dysarthria and anarthria: Secondary | ICD-10-CM | POA: Diagnosis not present

## 2023-01-14 DIAGNOSIS — Z7984 Long term (current) use of oral hypoglycemic drugs: Secondary | ICD-10-CM | POA: Diagnosis not present

## 2023-01-15 DIAGNOSIS — R471 Dysarthria and anarthria: Secondary | ICD-10-CM | POA: Diagnosis not present

## 2023-01-15 DIAGNOSIS — E1151 Type 2 diabetes mellitus with diabetic peripheral angiopathy without gangrene: Secondary | ICD-10-CM | POA: Diagnosis not present

## 2023-01-15 DIAGNOSIS — R1312 Dysphagia, oropharyngeal phase: Secondary | ICD-10-CM | POA: Diagnosis not present

## 2023-01-15 DIAGNOSIS — E1122 Type 2 diabetes mellitus with diabetic chronic kidney disease: Secondary | ICD-10-CM | POA: Diagnosis not present

## 2023-01-15 DIAGNOSIS — G231 Progressive supranuclear ophthalmoplegia [Steele-Richardson-Olszewski]: Secondary | ICD-10-CM | POA: Diagnosis not present

## 2023-01-15 DIAGNOSIS — H409 Unspecified glaucoma: Secondary | ICD-10-CM | POA: Diagnosis not present

## 2023-01-15 DIAGNOSIS — E1169 Type 2 diabetes mellitus with other specified complication: Secondary | ICD-10-CM | POA: Diagnosis not present

## 2023-01-15 DIAGNOSIS — Z794 Long term (current) use of insulin: Secondary | ICD-10-CM | POA: Diagnosis not present

## 2023-01-15 DIAGNOSIS — Z7984 Long term (current) use of oral hypoglycemic drugs: Secondary | ICD-10-CM | POA: Diagnosis not present

## 2023-01-15 DIAGNOSIS — E1159 Type 2 diabetes mellitus with other circulatory complications: Secondary | ICD-10-CM | POA: Diagnosis not present

## 2023-01-15 DIAGNOSIS — Z9181 History of falling: Secondary | ICD-10-CM | POA: Diagnosis not present

## 2023-01-15 DIAGNOSIS — I152 Hypertension secondary to endocrine disorders: Secondary | ICD-10-CM | POA: Diagnosis not present

## 2023-01-15 DIAGNOSIS — M19031 Primary osteoarthritis, right wrist: Secondary | ICD-10-CM | POA: Diagnosis not present

## 2023-01-15 DIAGNOSIS — Z7982 Long term (current) use of aspirin: Secondary | ICD-10-CM | POA: Diagnosis not present

## 2023-01-15 DIAGNOSIS — G8929 Other chronic pain: Secondary | ICD-10-CM | POA: Diagnosis not present

## 2023-01-15 DIAGNOSIS — M545 Low back pain, unspecified: Secondary | ICD-10-CM | POA: Diagnosis not present

## 2023-01-15 DIAGNOSIS — E785 Hyperlipidemia, unspecified: Secondary | ICD-10-CM | POA: Diagnosis not present

## 2023-01-15 DIAGNOSIS — N1831 Chronic kidney disease, stage 3a: Secondary | ICD-10-CM | POA: Diagnosis not present

## 2023-02-03 ENCOUNTER — Other Ambulatory Visit: Payer: Self-pay | Admitting: Internal Medicine

## 2023-02-03 DIAGNOSIS — Z1231 Encounter for screening mammogram for malignant neoplasm of breast: Secondary | ICD-10-CM

## 2023-02-27 ENCOUNTER — Other Ambulatory Visit: Payer: Self-pay | Admitting: Internal Medicine

## 2023-02-27 DIAGNOSIS — E1169 Type 2 diabetes mellitus with other specified complication: Secondary | ICD-10-CM

## 2023-03-04 ENCOUNTER — Telehealth: Payer: Self-pay | Admitting: Neurology

## 2023-03-04 MED ORDER — CARBIDOPA-LEVODOPA 25-100 MG PO TABS: 1.0000 | ORAL_TABLET | Freq: Three times a day (TID) | ORAL | 0 refills | Status: DC

## 2023-03-04 NOTE — Telephone Encounter (Signed)
 Refill sent in for pt.

## 2023-03-04 NOTE — Telephone Encounter (Signed)
 1. Which medications need refilled? (List name and dosage, if known) carbidopa-levodopa  2. Which pharmacy/location is medication to be sent to? (include street and city if local pharmacy) [facility]

## 2023-03-07 ENCOUNTER — Ambulatory Visit: Payer: 59

## 2023-03-07 ENCOUNTER — Ambulatory Visit: Payer: 59 | Admitting: Internal Medicine

## 2023-03-16 ENCOUNTER — Other Ambulatory Visit: Payer: Self-pay | Admitting: Pharmacist

## 2023-03-16 NOTE — Progress Notes (Signed)
Pharmacy TNM Diabetes Measure Review  S:  Patient was identified in a report as being at risk for failing the True [place] Metric of A1c control (<8%) in [place] and African American patients. Last A1c was 8.2%. Last PCP visit was 12/09/22.  Call placed to patient to discuss diabetes control and medication management. I connected with her daughter, Ms. [name]. Patient has been seen by the clinical pharmacist with most recent visit 11/09/21.  Current diabetes medications include: Lantus 16 units daily, Jardiance 25 mg daily Patient reports adherence to taking all medications as prescribed. She received ~90-ds of insulin and 90-ds of Jardiance last month.   Insurance coverage: [employer]  Patient denies hypoglycemic events.  Reported home fasting blood sugars: no specific numbers but her daughter reports that her blood sugars are doing fine.    O:  Lab Results  Component Value Date   HGBA1C 8.2 (A) 11/02/2022   There were no vitals filed for this visit.  Lipid Panel     Component Value Date/Time   CHOL 180 11/02/2022 1116   TRIG 138 11/02/2022 1116   HDL 51 11/02/2022 1116   CHOLHDL 3.5 11/02/2022 1116   LDLCALC 105 (H) 11/02/2022 1116    Clinical Atherosclerotic Cardiovascular Disease (ASCVD): No  The 10-year ASCVD risk score (Arnett DK, et al., 2019) is: 31.3%   Values used to calculate the score:     Age: 36 years     Sex: Female     Is Non-Hispanic African American: Yes     Diabetic: Yes     Tobacco smoker: No     Systolic Blood Pressure: 141 mmHg     Is BP treated: Yes     HDL Cholesterol: 51 mg/dL     Total Cholesterol: 180 mg/dL   Patient is participating in a Managed Medicaid Plan: No   A/P: Diabetes longstanding currently just above goal based on last A1c. Patient is able to verbalize appropriate hypoglycemia management plan. Medication adherence appears to be optimal and she was just dispensed 3 month supplies of her insulin and Jardiance at the end of  December. -Continued current regimen. -Patient educated on purpose, proper use, and potential adverse effects of insulin, Jardiance.  -Extensively discussed pathophysiology of diabetes, recommended lifestyle interventions, dietary effects on blood sugar control.  -Counseled on s/sx of and management of hypoglycemia.  -Next A1c anticipated at PCP visit next month.    Follow-up:  Pharmacist prn.  PCP clinic visit next month.   Butch Penny, PharmD, Patsy [place], CPP Clinical Pharmacist [facility] [phone]

## 2023-03-24 ENCOUNTER — Ambulatory Visit: Payer: 59

## 2023-03-30 ENCOUNTER — Other Ambulatory Visit (HOSPITAL_BASED_OUTPATIENT_CLINIC_OR_DEPARTMENT_OTHER): Payer: 59 | Admitting: Pharmacist

## 2023-03-30 ENCOUNTER — Encounter: Payer: Self-pay | Admitting: Pharmacist

## 2023-03-30 ENCOUNTER — Ambulatory Visit: Payer: Self-pay | Admitting: Internal Medicine

## 2023-03-30 DIAGNOSIS — E1169 Type 2 diabetes mellitus with other specified complication: Secondary | ICD-10-CM

## 2023-03-30 DIAGNOSIS — Z7984 Long term (current) use of oral hypoglycemic drugs: Secondary | ICD-10-CM

## 2023-03-30 DIAGNOSIS — E669 Obesity, unspecified: Secondary | ICD-10-CM

## 2023-03-30 DIAGNOSIS — Z794 Long term (current) use of insulin: Secondary | ICD-10-CM

## 2023-03-30 MED ORDER — LANTUS SOLOSTAR 100 UNIT/ML ~~LOC~~ SOPN: 20.0000 [IU] | PEN_INJECTOR | Freq: Every day | SUBCUTANEOUS | 99 refills | Status: DC

## 2023-03-30 NOTE — Telephone Encounter (Addendum)
Please inquire whether these blood sugar readings after breakfast are taken 2 hours after breakfast.  If they are, then we may need to add another insulin for her to take with meals.  If this is the case, please give them video visit to discuss.  Addendum: Please ignore this message.  The clinical pharmacist has spoken with the patient and her daughter already today.

## 2023-03-30 NOTE — Telephone Encounter (Signed)
Copied from CRM [id number]. Topic: Clinical - Medical Advice >> Mar 30, 2023  8:59 AM Fuller Mandril wrote: Reason for CRM: Patient daughter called concerned about patient blood sugar levels and request to speak to nurse about it.  1/16 223  1/19 210 1/24 213 1/25 213 1/29 210  Callback: [phone]  Thank You   Chief Complaint: elevated blood glucose Symptoms: 210-223 after eating breakfast Frequency: ongoing since 1/16 Pertinent Negatives: Patient denies rapid breathing, dizziness, fever, frequency urination  Disposition: [] ED /[] Urgent Care (no appt availability in office) / [] Appointment(In office/virtual)/ []  [facility]/ [] Home Care/ [] Refused Recommended Disposition /[] [facility]/ [x]  Follow-up with PCP Additional Notes: The patient's daughter called concerned about her mother's blood glucose readings.  The daughter has been sick and not around the patient who has been reporting her blood glucose readings to her as follows: 1/16 223  1/19 210 1/24 213 1/25 213 1/29 210 These readings were taken after the patient eats breakfast that is usually eggs and bacon.  She said her blood glucose has been as low as 23, date unknown and unsure if the patient gave an accurate number.  She said it's usually in the 80's after breakfast.  She questioned if any changes to her medication need to be made.   Reason for Disposition  [1] Caller has NON-URGENT medication or insulin pump question AND [2] triager unable to answer question  Answer Assessment - Initial Assessment Questions 1. BLOOD GLUCOSE: "What is your blood glucose level?"     1/16 223  1/19 210 1/24 213 1/25 213 1/29 210  2. ONSET: "When did you check the blood glucose?"     1/16/205 3. USUAL RANGE: "What is your glucose level usually?" (e.g., usual fasting morning value, usual evening value)     23-80 4. KETONES: "Do you check for ketones (urine or blood test strips)?" If Yes, ask: "What does the test  show now?"      Has not tested  5. TYPE 1 or 2:  "Do you know what type of diabetes you have?"  (e.g., Type 1, Type 2, Gestational; doesn't know)      Type 2  6. INSULIN: "Do you take insulin?" "What type of insulin(s) do you use? What is the mode of delivery? (syringe, pen; injection or pump)?"      Lantus  7. DIABETES PILLS: "Do you take any pills for your diabetes?" If Yes, ask: "Have you missed taking any pills recently?"     Jardiance and Lantus  8. OTHER SYMPTOMS: "Do you have any symptoms?" (e.g., fever, frequent urination, difficulty breathing, dizziness, weakness, vomiting)     Fatigue  Protocols used: Diabetes - High Blood Sugar-A-AH

## 2023-03-30 NOTE — Progress Notes (Signed)
Pharmacy TNM Diabetes Measure Review  S:  Patient was identified in a report as being at risk for failing the True [place] Metric of A1c control (<8%) in [place] and African American patients. Last A1c was 8.2%. Last PCP visit was 12/09/22.  Call placed to patient to discuss diabetes control and medication management. I connected with her daughter, Ms. [name]. Patient has been seen by the clinical pharmacist before with most recent outreach 03/16/2023.  Today, she is concerned with some sugar readings in the 200s, fasting. I've listed these below. No major changes in diet recently, but she did have pizza last night. No changes in symptoms and no hypoglycemia reported.  Current diabetes medications include: Lantus 16 units daily, Jardiance 25 mg daily Patient reports adherence to taking all medications as prescribed.   Insurance coverage: [employer]  Reported home fasting blood sugars:  1/16 223  1/19 210 1/24 213 1/25 213 1/29 210  Patient-reported dietary habits:  -Baked pizza yesterday   Patient-reported exercise:  -Unable   O:  Lab Results  Component Value Date   HGBA1C 8.2 (A) 11/02/2022   There were no vitals filed for this visit.  Lipid Panel     Component Value Date/Time   CHOL 180 11/02/2022 1116   TRIG 138 11/02/2022 1116   HDL 51 11/02/2022 1116   CHOLHDL 3.5 11/02/2022 1116   LDLCALC 105 (H) 11/02/2022 1116    Clinical Atherosclerotic Cardiovascular Disease (ASCVD): No  The 10-year ASCVD risk score (Arnett DK, et al., 2019) is: 31.3%   Values used to calculate the score:     Age: 73 years     Sex: Female     Is Non-Hispanic African American: Yes     Diabetic: Yes     Tobacco smoker: No     Systolic Blood Pressure: 141 mmHg     Is BP treated: Yes     HDL Cholesterol: 51 mg/dL     Total Cholesterol: 180 mg/dL   Patient is participating in a Managed Medicaid Plan: No   A/P: Diabetes longstanding currently just above goal based on last A1c.  Patient is able to verbalize appropriate hypoglycemia management plan. Medication adherence appears to be optimal and she was just dispensed 3 month supplies of her insulin and Jardiance at the end of December. We will have her increase Lantus dose.  -Continued Jardiance 25 mg daily.  -Increased Lantus from 16u to 20u daily. Daughter advised to call us with any continued hyperglycemia or hypoglycemia.  -Patient educated on purpose, proper use, and potential adverse effects of insulin, Jardiance.  -Extensively discussed pathophysiology of diabetes, recommended lifestyle interventions, dietary effects on blood sugar control.  -Counseled on s/sx of and management of hypoglycemia.  -Next A1c anticipated at PCP visit next month.    Follow-up:  Pharmacist prn.  PCP clinic visit next month.   Butch Penny, PharmD, Patsy [place], CPP Clinical Pharmacist [facility] [phone]

## 2023-03-31 NOTE — Telephone Encounter (Signed)
Noted

## 2023-04-01 ENCOUNTER — Telehealth: Payer: Self-pay

## 2023-04-01 ENCOUNTER — Telehealth: Payer: Self-pay | Admitting: Neurology

## 2023-04-01 NOTE — Telephone Encounter (Signed)
Copied from CRM [id number]. Topic: Clinical - Home Health Verbal Orders >> Apr 01, 2023  2:45 PM Geroge Baseman wrote: Caller/Agency: [name], patients daughter Callback Number: [phone] Service Requested: Speech Therapy Frequency: N/A Any new concerns about the patient? Yes, she said now her mothers speech is getting a little worse. When she chews her food her mouth starts to get tired, weakness in the jaw.

## 2023-04-01 NOTE — Telephone Encounter (Signed)
Reached out to [employer] at [employer] that was working on this for this patient

## 2023-04-01 NOTE — Telephone Encounter (Signed)
Pt's daughter called in wanting to follow up about the order for the patient to get a power wheelchair. She has not heard from anyone.

## 2023-04-01 NOTE — Telephone Encounter (Signed)
Would recommend that she touch base with her neurologist to see what she suggests.

## 2023-04-04 NOTE — Telephone Encounter (Signed)
Called [employer] and spoke to [place] about this patient she said the company is awaiting an extension of the paperwork needed for the wheelchair it had run out in Dec 2025. They are ready to deliver whenever they have that extension

## 2023-04-05 NOTE — Telephone Encounter (Signed)
Spoke with Tomeka . Patient's daughter. Advised that per PCP. They should reach out to the patient neurologist. [name] was agreeable to call.

## 2023-04-06 ENCOUNTER — Telehealth: Payer: Self-pay | Admitting: Neurology

## 2023-04-06 NOTE — Telephone Encounter (Signed)
 Pt. Daughter is saying Pt. Has fallen 3 times, speech is getting worse,hard to chew for short amounts of time and shortness of breath when just taking [place] to the bathroom, would like advice...placed pt. On wait list

## 2023-04-07 ENCOUNTER — Other Ambulatory Visit: Payer: Self-pay

## 2023-04-07 NOTE — Telephone Encounter (Signed)
 Patients daughter said she called PCP about Jaw pain and they told her to call Neurology. Patient  would like speech therapy approval to send to [facility]

## 2023-04-08 ENCOUNTER — Other Ambulatory Visit: Payer: Self-pay

## 2023-04-08 ENCOUNTER — Emergency Department (HOSPITAL_COMMUNITY): Payer: 59

## 2023-04-08 ENCOUNTER — Emergency Department (HOSPITAL_COMMUNITY)
Admission: EM | Admit: 2023-04-08 | Discharge: 2023-04-08 | Disposition: A | Discharge status: Home / Self Care | Payer: 59 | Attending: Emergency Medicine | Admitting: Emergency Medicine

## 2023-04-08 DIAGNOSIS — S0990XA Unspecified injury of head, initial encounter: Secondary | ICD-10-CM | POA: Insufficient documentation

## 2023-04-08 DIAGNOSIS — E042 Nontoxic multinodular goiter: Secondary | ICD-10-CM | POA: Diagnosis not present

## 2023-04-08 DIAGNOSIS — E041 Nontoxic single thyroid nodule: Secondary | ICD-10-CM | POA: Insufficient documentation

## 2023-04-08 DIAGNOSIS — M542 Cervicalgia: Secondary | ICD-10-CM | POA: Diagnosis not present

## 2023-04-08 DIAGNOSIS — Z7982 Long term (current) use of aspirin: Secondary | ICD-10-CM | POA: Insufficient documentation

## 2023-04-08 DIAGNOSIS — R1319 Other dysphagia: Secondary | ICD-10-CM

## 2023-04-08 DIAGNOSIS — G231 Progressive supranuclear ophthalmoplegia [Steele-Richardson-Olszewski]: Secondary | ICD-10-CM

## 2023-04-08 DIAGNOSIS — W19XXXA Unspecified fall, initial encounter: Secondary | ICD-10-CM | POA: Insufficient documentation

## 2023-04-08 DIAGNOSIS — Y92000 Kitchen of unspecified non-institutional (private) residence as  the place of occurrence of the external cause: Secondary | ICD-10-CM | POA: Insufficient documentation

## 2023-04-08 DIAGNOSIS — R739 Hyperglycemia, unspecified: Secondary | ICD-10-CM | POA: Diagnosis not present

## 2023-04-08 NOTE — ED Triage Notes (Signed)
 Pt BIB EMS coming from home. States she had a fall in the kitchen. Uses rolling [place]arkowski, states she made a turn and fell backwards hitting her head with a chair. No LOC, not on thinners. Denies any sob, pain at this time. NAD, Hx: DM  BP 14/80, HR 84, Spo2 99%, CBG 261

## 2023-04-08 NOTE — ED Provider Notes (Signed)
 [place] EMERGENCY DEPARTMENT AT [facility] Provider Note   CSN: [id number] Arrival date & time: 04/08/23  1431     History  Chief Complaint  Patient presents with   Kristi Campos is a 73 y.o. female presenting to the hospital with a mechanical fall striking the back of her head on a chair.  She reports she is having pain in the back of her head now.  Possibly the upper neck.  She is not on blood thinners.  Denies nausea, vomiting, balance problems.  HPI     Home Medications Prior to Admission medications   Medication Sig Start Date End Date Taking? 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    [provider]  atorvastatin  (LIPITOR) 10 MG tablet Take 1 tablet (10 mg total) by mouth daily. 11/02/22   Vicci Barnie NOVAK, MD  Blood Glucose Monitoring Suppl (ACCU-CHEK GUIDE) w/Device KIT Use to check blood sugar TID. Dx E11.69 10/30/21   Vicci Barnie NOVAK, MD  carbidopa -levodopa  (SINEMET  IR) 25-100 MG tablet Take 1 tablet by mouth 3 (three) times daily. 03/04/23   Tat, Asberry RAMAN, DO  diclofenac  Sodium (VOLTAREN ) 1 % GEL Apply 4 g topically 2 (two) times daily as needed. 10/04/22   Newlin, Enobong, MD  glucose blood (ACCU-CHEK GUIDE) test strip USE AS DIRECTED THREE TIMES DAILY 07/05/22   Vicci Barnie NOVAK, MD  insulin  glargine (LANTUS  SOLOSTAR) 100 UNIT/ML Solostar Pen Inject 20 Units into the skin daily. 03/30/23   Vicci Barnie NOVAK, MD  Insulin  Pen Needle (B-D ULTRAFINE III SHORT PEN) 31G X 8 MM MISC USE AS DIRECTED 11/03/22   Vicci Barnie NOVAK, MD  JARDIANCE  25 MG TABS tablet TAKE 1 TABLET(25 MG) BY MOUTH DAILY 02/28/23   Vicci Barnie NOVAK, MD  lidocaine  (LIDODERM ) 5 % Place 1 patch onto the skin daily. Remove & Discard patch within 12 hours or as directed by MD 10/04/22   Delbert Clam, MD   losartan -hydrochlorothiazide (HYZAAR) 100-25 MG tablet TAKE 1/2 TABLET BY MOUTH DAILY 11/02/22   Vicci Barnie NOVAK, MD  nitroGLYCERIN  (NITROSTAT ) 0.4 MG SL tablet Place 1 tablet (0.4 mg total) under the tongue every 5 (five) minutes as needed for chest pain. 10/18/20   Krishnan, Gokul, MD  pantoprazole  (PROTONIX ) 40 MG tablet TAKE 1 TABLET BY MOUTH DAILY AT NOON 04/22/22   Vicci Barnie NOVAK, MD  simvastatin (ZOCOR) 10 MG tablet Take 10 mg by mouth at bedtime. 01/01/19 02/28/20  [provider]      Allergies    Metformin  and related    Review of Systems   Review of Systems  Physical Exam Updated Vital Signs BP (!) 136/58 (BP Location: Left Arm)   Pulse 80   Temp 98.6 F (37 C) (Oral)   Resp 18   Ht 5' 3 (1.6 m)   Wt 80.7 kg   SpO2 98%   BMI 31.53 kg/m  Physical Exam Constitutional:      General: She is not in acute distress. HENT:     Head: Normocephalic and atraumatic.  Eyes:     Conjunctiva/sclera: Conjunctivae normal.     Pupils: Pupils are equal, round, and reactive to light.  Neck:     Comments: No spinal midline tenderness Cardiovascular:     Rate and Rhythm: Normal rate and  regular rhythm.  Pulmonary:     Effort: Pulmonary effort is normal. No respiratory distress.  Abdominal:     General: There is no distension.     Tenderness: There is no abdominal tenderness.  Skin:    General: Skin is warm and dry.  Neurological:     General: No focal deficit present.     Mental Status: She is alert and oriented to person, place, and time. Mental status is at baseline.  Psychiatric:        Mood and Affect: Mood normal.        Behavior: Behavior normal.     ED Results / Procedures / Treatments   Labs (all labs ordered are listed, but only abnormal results are displayed) Labs Reviewed - No data to display  EKG None  Radiology CT Head Wo Contrast Result Date: 04/08/2023 CLINICAL DATA:  Fall with head strike EXAM: CT HEAD WITHOUT CONTRAST CT CERVICAL SPINE  WITHOUT CONTRAST TECHNIQUE: Multidetector CT imaging of the head and cervical spine was performed following the standard protocol without intravenous contrast. Multiplanar CT image reconstructions of the cervical spine were also generated. RADIATION DOSE REDUCTION: This exam was performed according to the departmental dose-optimization program which includes automated exposure control, adjustment of the mA and/or kV according to patient size and/or use of iterative reconstruction technique. COMPARISON:  01/04/2023 CT head and cervical spine FINDINGS: CT HEAD FINDINGS Brain: No evidence of acute infarct, hemorrhage, mass, mass effect, or midline shift. No hydrocephalus or extra-axial fluid collection. Partial empty sella. Vascular: No hyperdense vessel. Skull: Negative for fracture or focal lesion. Sinuses/Orbits: No acute finding. Other: The mastoid air cells are well aerated. CT CERVICAL SPINE FINDINGS Alignment: No traumatic listhesis. Skull base and vertebrae: No acute fracture or suspicious osseous lesion. Soft tissues and spinal canal: No prevertebral fluid or swelling. No visible canal hematoma. Disc levels: Degenerative changes in the cervical spine.No high-grade spinal canal stenosis. Upper chest: No focal pulmonary opacity or pleural effusion. Nodules in the thyroid , the largest of which measures up to 1.6 cm. IMPRESSION: 1. No acute intracranial process. 2. No acute fracture or traumatic listhesis in the cervical spine. 3. Nodules in the thyroid , the largest of which measures up to 1.6 cm. If this has not previously been evaluated, a non-emergent ultrasound of the thyroid  is recommended. (Reference: J Am Coll Radiol. 2015 Feb;12(2): 143-50) Electronically Signed   By: Donald Campion M.D.   On: 04/08/2023 15:59   CT Cervical Spine Wo Contrast Result Date: 04/08/2023 CLINICAL DATA:  Fall with head strike EXAM: CT HEAD WITHOUT CONTRAST CT CERVICAL SPINE WITHOUT CONTRAST TECHNIQUE: Multidetector CT imaging of  the head and cervical spine was performed following the standard protocol without intravenous contrast. Multiplanar CT image reconstructions of the cervical spine were also generated. RADIATION DOSE REDUCTION: This exam was performed according to the departmental dose-optimization program which includes automated exposure control, adjustment of the mA and/or kV according to patient size and/or use of iterative reconstruction technique. COMPARISON:  01/04/2023 CT head and cervical spine FINDINGS: CT HEAD FINDINGS Brain: No evidence of acute infarct, hemorrhage, mass, mass effect, or midline shift. No hydrocephalus or extra-axial fluid collection. Partial empty sella. Vascular: No hyperdense vessel. Skull: Negative for fracture or focal lesion. Sinuses/Orbits: No acute finding. Other: The mastoid air cells are well aerated. CT CERVICAL SPINE FINDINGS Alignment: No traumatic listhesis. Skull base and vertebrae: No acute fracture or suspicious osseous lesion. Soft tissues and spinal canal: No prevertebral fluid or swelling. No  visible canal hematoma. Disc levels: Degenerative changes in the cervical spine.No high-grade spinal canal stenosis. Upper chest: No focal pulmonary opacity or pleural effusion. Nodules in the thyroid , the largest of which measures up to 1.6 cm. IMPRESSION: 1. No acute intracranial process. 2. No acute fracture or traumatic listhesis in the cervical spine. 3. Nodules in the thyroid , the largest of which measures up to 1.6 cm. If this has not previously been evaluated, a non-emergent ultrasound of the thyroid  is recommended. (Reference: J Am Coll Radiol. 2015 Feb;12(2): 143-50) Electronically Signed   By: Donald Campion M.D.   On: 04/08/2023 15:59    Procedures Procedures    Medications Ordered in ED Medications - No data to display  ED Course/ Medical Decision Making/ A&P                                 Medical Decision Making Amount and/or Complexity of Data Reviewed Radiology:  ordered.   This is a well-appearing patient presenting with an isolated injury to the back of the head of the upper neck after mechanical fall today.  Based on her age and mechanism, I think CT imaging would be reasonable. CT imaging of the head and cervical spine were ordered and personally viewed interpreted, notable for no emergent findings  GCS is 15, she does not appear to be in distress, and otherwise appears to be neurologically at baseline.  No other traumatic injuries were noted on my exam.  I think she is stable for discharge        Final Clinical Impression(s) / ED Diagnoses Final diagnoses:  Fall, initial encounter  Injury of head, initial encounter  Thyroid  nodule    Rx / DC Orders ED Discharge Orders     None         Cottie Donnice PARAS, MD 04/08/23 1616

## 2023-04-08 NOTE — Discharge Instructions (Addendum)
 The CT scan of your head and your cervical spine did not show signs of bleeding on the brain or any broken bone in the neck.  You may have some mild headache, foggy or fuzzy headedness, on and off for the next 1 to 2 weeks.  You can follow-up with your primary care clinic if you have persistent symptoms.  Your CT scan showed that you have an incidental thyroid  nodule.  This can be followed up by your primary care clinic in the future.

## 2023-04-11 ENCOUNTER — Ambulatory Visit: Payer: 59 | Attending: Internal Medicine | Admitting: Internal Medicine

## 2023-04-11 ENCOUNTER — Encounter: Payer: Self-pay | Admitting: Internal Medicine

## 2023-04-11 VITALS — BP 131/78 | HR 88 | Temp 98.7°F | Ht 63.0 in | Wt 194.0 lb

## 2023-04-11 DIAGNOSIS — E041 Nontoxic single thyroid nodule: Secondary | ICD-10-CM

## 2023-04-11 DIAGNOSIS — Z6834 Body mass index (BMI) 34.0-34.9, adult: Secondary | ICD-10-CM

## 2023-04-11 DIAGNOSIS — N1831 Chronic kidney disease, stage 3a: Secondary | ICD-10-CM

## 2023-04-11 DIAGNOSIS — Z9181 History of falling: Secondary | ICD-10-CM

## 2023-04-11 DIAGNOSIS — E1159 Type 2 diabetes mellitus with other circulatory complications: Secondary | ICD-10-CM | POA: Diagnosis not present

## 2023-04-11 DIAGNOSIS — Z7984 Long term (current) use of oral hypoglycemic drugs: Secondary | ICD-10-CM

## 2023-04-11 DIAGNOSIS — I152 Hypertension secondary to endocrine disorders: Secondary | ICD-10-CM | POA: Diagnosis not present

## 2023-04-11 DIAGNOSIS — I129 Hypertensive chronic kidney disease with stage 1 through stage 4 chronic kidney disease, or unspecified chronic kidney disease: Secondary | ICD-10-CM | POA: Diagnosis not present

## 2023-04-11 DIAGNOSIS — E1169 Type 2 diabetes mellitus with other specified complication: Secondary | ICD-10-CM | POA: Diagnosis not present

## 2023-04-11 DIAGNOSIS — R0609 Other forms of dyspnea: Secondary | ICD-10-CM

## 2023-04-11 DIAGNOSIS — Z794 Long term (current) use of insulin: Secondary | ICD-10-CM

## 2023-04-11 DIAGNOSIS — G231 Progressive supranuclear ophthalmoplegia [Steele-Richardson-Olszewski]: Secondary | ICD-10-CM | POA: Diagnosis not present

## 2023-04-11 DIAGNOSIS — E119 Type 2 diabetes mellitus without complications: Secondary | ICD-10-CM

## 2023-04-11 DIAGNOSIS — R0989 Other specified symptoms and signs involving the circulatory and respiratory systems: Secondary | ICD-10-CM

## 2023-04-11 DIAGNOSIS — R471 Dysarthria and anarthria: Secondary | ICD-10-CM | POA: Diagnosis not present

## 2023-04-11 DIAGNOSIS — E66811 Obesity, class 1: Secondary | ICD-10-CM

## 2023-04-11 DIAGNOSIS — E669 Obesity, unspecified: Secondary | ICD-10-CM

## 2023-04-11 LAB — GLUCOSE, POCT (MANUAL RESULT ENTRY): POC Glucose: 159 mg/dL — AB (ref 70–99)

## 2023-04-11 LAB — POCT GLYCOSYLATED HEMOGLOBIN (HGB A1C): HbA1c, POC (controlled diabetic range): 9.2 % — AB (ref 0.0–7.0)

## 2023-04-11 NOTE — Progress Notes (Signed)
 Patient ID: Kristi Campos, female    DOB: March 21, 1950  MRN: [id number]  CC: Diabetes (DM f/u./Fall on 04/08/2023 /Concern about speech - feels that mouth gets tired while speaking / eating /SOB with short distances or going to the bathroom/Already received flu vax)   Subjective: Kristi Campos is a 73 y.o. female who presents for chronic ds management.  Daughter is with her. 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 Candi Chafe).    Patient seen in the emergency room on 04/08/2023 for fall.  She was in her kitchen and rollator Shelden slip away from her. She hit the back of her head on a chair.  CT of the head and C-spine were negative.  Incidental finding was thyroid  nodules largest of which was 1.6 cm.  Radiologist recommended thyroid  ultrasound. -has been approved for mobility chair using a different company.  Should have it in 2 wks. Has fallen 4 x in past 4 mths -feels mouth gets tire when she eats/no pain with chewing.  Has problems getting words out.  Work up for GCA last yr was negative.  Her neurologist has put her in for some speech therapy again. Reports sob when she walks short distances like from bed to bathroom x 2 mths.  No LE edema.  Sleeps on 1 pillow, no PND. Little cough at times. No wheezing. Not very mobile due to PSP. Sits and watches TV most of the day.  DM:  Results for orders placed or performed in visit on 04/11/23  POCT glucose (manual entry)   Collection Time: 04/11/23  2:17 PM  Result Value Ref Range   POC Glucose 159 (A) 70 - 99 mg/dl  POCT glycosylated hemoglobin (Hb A1C)   Collection Time: 04/11/23  2:25 PM  Result Value Ref Range   Hemoglobin A1C     HbA1c POC (<> result, manual entry)     HbA1c, POC (prediabetic range)     HbA1c, POC (controlled diabetic range) 9.2 (A) 0.0 - 7.0 %  compliance with Jardiance  25  and Lantus  20 units.  Reports taking  medicines consistently. Checks before breakfast.  She has log book with her.  Range is 105-194 with 3 in 200s  HTN:  taking Cozaar /HCTZ 100/25/2 tablet daily.  Does not use too much salt in foods. HL:  taking and tolerating Lipitor CKD 3a: GFR range has been 50-42 with last reading of 49 in September of last year.  She is not on any NSAIDs.  Patient Active Problem List   Diagnosis Date Noted   Stage 3a chronic kidney disease (HCC) 06/24/2022   Patient has active power of attorney for health care 06/24/2022   Progressive supranuclear palsy (HCC) 01/27/2021   AKI (acute kidney injury) (HCC) 10/18/2020   Chest pain 10/17/2020   Primary osteoarthritis, right wrist 02/28/2020   Peripheral arterial disease (HCC) 10/03/2019   Essential hypertension 10/03/2019   Hyperlipidemia 10/03/2019   Type 2 diabetes mellitus with obesity (HCC) 11/24/2018   Chronic right-sided low back pain without sciatica 10/24/2018     Current Outpatient Medications on File Prior to Visit  Medication Sig Dispense Refill   AMBULATORY NON FORMULARY MEDICATION Please dispense 1 power wheelchair 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     atorvastatin  (LIPITOR) 10 MG tablet Take 1  tablet (10 mg total) by mouth daily. 90 tablet 1   Blood Glucose Monitoring Suppl (ACCU-CHEK GUIDE) w/Device KIT Use to check blood sugar TID. Dx E11.69 1 kit 0   carbidopa -levodopa  (SINEMET  IR) 25-100 MG tablet Take 1 tablet by mouth 3 (three) times daily. 270 tablet 0   diclofenac  Sodium (VOLTAREN ) 1 % GEL Apply 4 g topically 2 (two) times daily as needed. 100 g 1   glucose blood (ACCU-CHEK GUIDE) test strip USE AS DIRECTED THREE TIMES DAILY 100 strip 2   insulin  glargine (LANTUS  SOLOSTAR) 100 UNIT/ML Solostar Pen Inject 20 Units into the skin daily. 15 mL PRN   Insulin  Pen Needle (B-D ULTRAFINE III SHORT PEN) 31G X 8 MM MISC USE AS DIRECTED 100 each 2   JARDIANCE  25 MG TABS tablet TAKE 1  TABLET(25 MG) BY MOUTH DAILY 90 tablet 0   lidocaine  (LIDODERM ) 5 % Place 1 patch onto the skin daily. Remove & Discard patch within 12 hours or as directed by MD 30 patch 1   losartan -hydrochlorothiazide (HYZAAR) 100-25 MG tablet TAKE 1/2 TABLET BY MOUTH DAILY 45 tablet 1   nitroGLYCERIN  (NITROSTAT ) 0.4 MG SL tablet Place 1 tablet (0.4 mg total) under the tongue every 5 (five) minutes as needed for chest pain. 20 tablet 0   pantoprazole  (PROTONIX ) 40 MG tablet TAKE 1 TABLET BY MOUTH DAILY AT NOON 90 tablet 2   [DISCONTINUED] simvastatin (ZOCOR) 10 MG tablet Take 10 mg by mouth at bedtime.     No current facility-administered medications on file prior to visit.    Allergies  Allergen Reactions   Metformin  And Related Diarrhea    Social History   Socioeconomic History   Marital status: Widowed    Spouse name: Not on file   Number of children: 4   Years of education: Not on file   Highest education level: Not on file  Occupational History    Employer: other   Occupation: retired    Comment: weave for living  Tobacco Use   Smoking status: Never   Smokeless tobacco: Never  Vaping Use   Vaping status: Never Used  Substance and Sexual Activity   Alcohol use: No    Alcohol/week: 0.0 standard drinks of alcohol   Drug use: No   Sexual activity: Not Currently    Partners: Male    Birth control/protection: Post-menopausal  Other Topics Concern   Not on file  Social History Narrative   Right handed    Lives at home alone ONE LEVEL   Caffeine1 cup daily   Social Drivers of Health   Financial Resource Strain: Medium Risk (12/09/2022)   Overall Financial Resource Strain (CARDIA)    Difficulty of Paying Living Expenses: Somewhat hard  Food Insecurity: Food Insecurity Present (12/09/2022)   Hunger Vital Sign    Worried About Running Out of Food in the Last Year: Sometimes true    Ran Out of Food in the Last Year: Sometimes true  Transportation Needs: No Transportation Needs  (04/29/2022)   PRAPARE - Administrator, Civil Service (Medical): No    Lack of Transportation (Non-Medical): No  Physical Activity: Inactive (12/09/2022)   Exercise Vital Sign    Days of Exercise per Week: 0 days    Minutes of Exercise per Session: 0 min  Stress: No Stress Concern Present (12/09/2022)   [employer] of Occupational Health - Occupational Stress Questionnaire    Feeling of Stress : Not at all  Social Connections: Socially Isolated (  12/09/2022)   Social Connection and Isolation Panel [NHANES]    Frequency of Communication with Friends and Family: More than three times a week    Frequency of Social Gatherings with Friends and Family: Never    Attends Religious Services: Never    Database administrator or Organizations: No    Attends Engineer, structural: Not on file    Marital Status: Widowed  Intimate Partner Violence: Not At Risk (12/09/2022)   Humiliation, Afraid, Rape, and Kick questionnaire    Fear of Current or Ex-Partner: No    Emotionally Abused: No    Physically Abused: No    Sexually Abused: No    Family History  Problem Relation Age of Onset   Bipolar disorder Mother    Hypertension Sister    Hypertension Sister    Hypertension Sister    Hypertension Sister    Hypertension Sister    Hypertension Sister    Hypertension Daughter    Diabetes Son    Prostate cancer Son     Past Surgical History:  Procedure Laterality Date   ARTERY BIOPSY Left 11/26/2021   Procedure: BIOPSY TEMPORAL ARTERY;  Surgeon: Carlene Che, MD;  Location: [facility] OR;  Service: Vascular;  Laterality: Left;   COLONOSCOPY     I & D EXTREMITY Right 08/09/2013   Procedure: MINOR IRRIGATION AND DEBRIDEMENT EXTREMITY;  Surgeon: Amelie Baize, MD;  Location: [facility];  Service: Orthopedics;  Laterality: Right;  long       wound class 4    ROS: Review of Systems Negative except as stated above  PHYSICAL EXAM: BP 131/78 (BP Location:  Left Arm, Patient Position: Sitting, Cuff Size: Large)   Pulse 88   Temp 98.7 F (37.1 C) (Oral)   Ht 5\' 3"  (1.6 m)   Wt 194 lb (88 kg)   SpO2 97%   BMI 34.37 kg/m   Wt Readings from Last 3 Encounters:  04/11/23 194 lb (88 kg)  04/08/23 178 lb (80.7 kg)  12/16/22 192 lb (87.1 kg)    Physical Exam  General appearance - alert, well appearing, older African-American female and in no distress.  Patient is sitting in her manual wheelchair Mental status -patient with flat affect. Neck - supple, no significant adenopathy, thyroid  is not enlarged.  No thyroid  nodules appreciated on exam Chest -few fine crackles heard at the left base. Heart - normal rate, regular rhythm, normal S1, S2, no murmurs, rubs, clicks or gallops Extremities -no lower extremity edema. Neuro: Patient has great difficulty in getting words out      Latest Ref Rng & Units 11/02/2022   11:16 AM 04/21/2022    1:29 PM 11/26/2021    7:15 AM  CMP  Glucose 70 - 99 mg/dL 914  782  956   BUN 8 - 27 mg/dL 18  17  26    Creatinine 0.57 - 1.00 mg/dL 2.13  0.86  5.78   Sodium 134 - 144 mmol/L 144  138  142   Potassium 3.5 - 5.2 mmol/L 4.4  3.9  3.7   Chloride 96 - 106 mmol/L 103  104  105   CO2 20 - 29 mmol/L 26  25    Calcium  8.7 - 10.3 mg/dL 46.9  9.1    Total Protein 6.0 - 8.5 g/dL 7.6  7.2    Total Bilirubin 0.0 - 1.2 mg/dL 0.4  0.8    Alkaline Phos 44 - 121 IU/L 82  61  AST 0 - 40 IU/L 14  17    ALT 0 - 32 IU/L 9  <5     Lipid Panel     Component Value Date/Time   CHOL 180 11/02/2022 1116   TRIG 138 11/02/2022 1116   HDL 51 11/02/2022 1116   CHOLHDL 3.5 11/02/2022 1116   LDLCALC 105 (H) 11/02/2022 1116    CBC    Component Value Date/Time   WBC 5.4 04/21/2022 1329   RBC 4.36 04/21/2022 1329   HGB 13.4 04/21/2022 1329   HGB 13.9 10/22/2021 1134   HCT 40.7 04/21/2022 1329   HCT 41.8 10/22/2021 1134   PLT 253 04/21/2022 1329   PLT 277 10/22/2021 1134   MCV 93.3 04/21/2022 1329   MCV 92 10/22/2021  1134   MCH 30.7 04/21/2022 1329   MCHC 32.9 04/21/2022 1329   RDW 14.6 04/21/2022 1329   RDW 13.9 10/22/2021 1134   LYMPHSABS 2.1 04/21/2022 1329   MONOABS 0.5 04/21/2022 1329   EOSABS 0.1 04/21/2022 1329   BASOSABS 0.0 04/21/2022 1329    ASSESSMENT AND PLAN: 1. Type 2 diabetes mellitus with obesity (HCC) (Primary) A1c is not at goal and has increased from previous. Continue Jardiance  25 mg daily.  Increase Lantus  insulin  to 23 units daily.  Will have her follow-up with the clinical pharmacist in about 3 weeks for recheck.  She did not tolerate GLP-1 agonist last year.  She is also intolerant of metformin . - POCT glycosylated hemoglobin (Hb A1C) - POCT glucose (manual entry)  2. Hypertension associated with diabetes (HCC) Close to goal.  Continue Cozaar /HCTZ 100/25 mg half a tablet daily.  3. Stage 3a chronic kidney disease (HCC) Stable.  Will continue to monitor.  Advised to avoid NSAIDs.  4. Progressive supranuclear palsy (HCC) Diseases progressing leading to recurrent falls.  She has done home physical therapy several times.  Currently awaits mobility chair which I think would be very beneficial for her.  5. Dysarthria Likely related to #4 above  6. History of recent fall See #4 above.  Advised patient to always lock her rollator [place] when she sits down or turns around in the kitchen to get something.  7. Dyspnea on exertion We will do some baseline workup including a chest x-ray.  Differential diagnosis include deconditioning, CHF, anemia, PE (but pt has no LE edema at this time) - Brain natriuretic peptide - CBC - Basic Metabolic Panel - DG Chest 2 View; Future  8. Respiratory crackles at left lung base - DG Chest 2 View; Future  9. Thyroid  nodule - TSH+T4F+T3Free - US  THYROID ; Future    Patient was given the opportunity to ask questions.  Patient verbalized understanding of the plan and was able to repeat key elements of the plan.   This documentation was  completed using Paediatric nurse.  Any transcriptional errors are unintentional.  Orders Placed This Encounter  Procedures   US  THYROID    DG Chest 2 View   TSH+T4F+T3Free   Brain natriuretic peptide   CBC   Basic Metabolic Panel   POCT glycosylated hemoglobin (Hb A1C)   POCT glucose (manual entry)     Requested Prescriptions    No prescriptions requested or ordered in this encounter    Return in about 4 months (around 08/09/2023) for [facility] in 3 wks for DM. Medicare Wellness Visit in  3 wks with CMA.  Concetta Dee, MD, FACP

## 2023-04-11 NOTE — Patient Instructions (Signed)
 You can go to [place] [facility] at [facility] [employer]. to have the chest x-ray done at any time except on the weekends.  You do not need an appointment.  [place] [facility] will call you to schedule the thyroid  ultrasound.  Increase Lantus  insulin  to 23 units daily.  Continue to record blood sugar readings.  We will have you follow-up with the clinical pharmacist in 3 weeks.

## 2023-04-12 LAB — CBC
Hematocrit: 41.9 % (ref 34.0–46.6)
Hemoglobin: 13.4 g/dL (ref 11.1–15.9)
MCH: 30.2 pg (ref 26.6–33.0)
MCHC: 32 g/dL (ref 31.5–35.7)
MCV: 94 fL (ref 79–97)
Platelets: 260 10*3/uL (ref 150–450)
RBC: 4.44 x10E6/uL (ref 3.77–5.28)
RDW: 13.5 % (ref 11.7–15.4)
WBC: 7.2 10*3/uL (ref 3.4–10.8)

## 2023-04-12 LAB — TSH+T4F+T3FREE
Free T4: 1.22 ng/dL (ref 0.82–1.77)
T3, Free: 2.7 pg/mL (ref 2.0–4.4)
TSH: 0.962 u[IU]/mL (ref 0.450–4.500)

## 2023-04-12 LAB — BASIC METABOLIC PANEL
BUN/Creatinine Ratio: 15 (ref 12–28)
BUN: 15 mg/dL (ref 8–27)
CO2: 25 mmol/L (ref 20–29)
Calcium: 9.7 mg/dL (ref 8.7–10.3)
Chloride: 106 mmol/L (ref 96–106)
Creatinine, Ser: 1.01 mg/dL — ABNORMAL HIGH (ref 0.57–1.00)
Glucose: 151 mg/dL — ABNORMAL HIGH (ref 70–99)
Potassium: 5 mmol/L (ref 3.5–5.2)
Sodium: 146 mmol/L — ABNORMAL HIGH (ref 134–144)
eGFR: 59 mL/min/{1.73_m2} — ABNORMAL LOW (ref >59)

## 2023-04-12 LAB — BRAIN NATRIURETIC PEPTIDE: BNP: 36.1 pg/mL (ref 0.0–100.0)

## 2023-04-13 ENCOUNTER — Encounter: Payer: Self-pay | Admitting: Internal Medicine

## 2023-04-14 ENCOUNTER — Ambulatory Visit
Admission: RE | Admit: 2023-04-14 | Discharge: 2023-04-14 | Disposition: A | Discharge status: Home / Self Care | Payer: 59 | Source: Ambulatory Visit | Attending: Internal Medicine | Admitting: Internal Medicine

## 2023-04-14 DIAGNOSIS — E042 Nontoxic multinodular goiter: Secondary | ICD-10-CM | POA: Diagnosis not present

## 2023-04-14 DIAGNOSIS — R0609 Other forms of dyspnea: Secondary | ICD-10-CM | POA: Diagnosis not present

## 2023-04-14 DIAGNOSIS — R0989 Other specified symptoms and signs involving the circulatory and respiratory systems: Secondary | ICD-10-CM

## 2023-04-14 DIAGNOSIS — E041 Nontoxic single thyroid nodule: Secondary | ICD-10-CM

## 2023-04-21 ENCOUNTER — Telehealth: Payer: Self-pay

## 2023-04-21 ENCOUNTER — Encounter: Payer: Self-pay | Admitting: Internal Medicine

## 2023-04-21 NOTE — Telephone Encounter (Signed)
Copied from CRM [id number]. Topic: General - Other >> Apr 21, 2023 11:42 AM Eunice Blase wrote: Reason for CRM: Pt's daughter calling on behalf of pt for pt's lab results. She would like to go over lab results, please call [phone]

## 2023-04-21 NOTE — Telephone Encounter (Signed)
Patient daughter's Tomeka( On DPR) voiced that she was calling for imaging and [place] results. Chest Xray and Thyroid US result are unavailable.

## 2023-04-25 ENCOUNTER — Encounter: Payer: Self-pay | Admitting: Internal Medicine

## 2023-04-25 ENCOUNTER — Other Ambulatory Visit: Payer: Self-pay | Admitting: Internal Medicine

## 2023-04-25 DIAGNOSIS — R0609 Other forms of dyspnea: Secondary | ICD-10-CM

## 2023-04-26 ENCOUNTER — Other Ambulatory Visit: Payer: Self-pay | Admitting: Internal Medicine

## 2023-04-26 DIAGNOSIS — I152 Hypertension secondary to endocrine disorders: Secondary | ICD-10-CM

## 2023-05-02 ENCOUNTER — Ambulatory Visit: Payer: 59

## 2023-05-02 ENCOUNTER — Ambulatory Visit
Admission: RE | Admit: 2023-05-02 | Discharge: 2023-05-02 | Disposition: A | Discharge status: Home / Self Care | Source: Ambulatory Visit | Attending: Internal Medicine | Admitting: Internal Medicine

## 2023-05-02 DIAGNOSIS — Z1231 Encounter for screening mammogram for malignant neoplasm of breast: Secondary | ICD-10-CM | POA: Diagnosis not present

## 2023-05-04 ENCOUNTER — Encounter: Payer: Self-pay | Admitting: Internal Medicine

## 2023-05-12 ENCOUNTER — Ambulatory Visit: Payer: 59 | Attending: Internal Medicine | Admitting: Pharmacist

## 2023-05-12 ENCOUNTER — Encounter: Payer: Self-pay | Admitting: Pharmacist

## 2023-05-12 ENCOUNTER — Telehealth: Payer: Self-pay | Admitting: Neurology

## 2023-05-12 DIAGNOSIS — E669 Obesity, unspecified: Secondary | ICD-10-CM | POA: Diagnosis not present

## 2023-05-12 DIAGNOSIS — E1169 Type 2 diabetes mellitus with other specified complication: Secondary | ICD-10-CM | POA: Diagnosis not present

## 2023-05-12 DIAGNOSIS — Z7984 Long term (current) use of oral hypoglycemic drugs: Secondary | ICD-10-CM

## 2023-05-12 DIAGNOSIS — Z794 Long term (current) use of insulin: Secondary | ICD-10-CM

## 2023-05-12 MED ORDER — LANTUS SOLOSTAR 100 UNIT/ML ~~LOC~~ SOPN: 28.0000 [IU] | PEN_INJECTOR | Freq: Every day | SUBCUTANEOUS | 99 refills | Status: DC

## 2023-05-12 NOTE — Telephone Encounter (Signed)
 Pt's daughter in inquiring about the wheelchair order. She has not heard back from anyone.

## 2023-05-12 NOTE — Progress Notes (Addendum)
 S:     No chief complaint on file.  Kristi Campos is a 73 y.o. female who presents for diabetes evaluation, education, and management.  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  Patient was referred and last seen by Primary Care Provider, Dr. Laural Benes, on 04/11/2023. A1c at that visit was 9.2%. She was identified on a quality control report as needing improved DM control. I saw her last via telephone 03/30/2023.  At last visit with Dr. Laural Benes, insulin was increased.   Today, patient arrives in good spirits and presents with her daughter. She endorses adherence to her insulin and Jardiance. She is taking 22units as prescribed but admits to continued morning sugar readings > 200 fasting. Denies any hypoglycemia. Of note, she endorses some increased urinary frequency and vision changes as well since her last visit. She has a hx of trial and failure of GLP-1 agents and metformin. Was on glipizide previously but stopped to minimize hypoglycemia risk.   Family/Social History:  -Fhx: HTN -Tobacco: never smoked  Current diabetes medications include: Lantus 22 units once daily, Jardiance 25 mg daily.  Patient reports adherence to taking all medications as prescribed.   Patient denies hypoglycemic events.   Reported home fasting blood sugars: 220. Gives readings in the 200s most of the time.   Reported 2 hour post-meal/random blood sugars: 200s.  Patient reports nocturia (nighttime urination).  Patient denies neuropathy (nerve pain). Patient reports visual changes. Patient reports self foot exams.   Patient reported dietary habits:  -Eats 2-3 meals  -Patient doesn't eat a lot of carb, drinks apple and orange juice.   Patient-reported exercise habits: -Patient doesn't have a current exercising regimen.   O:  Lab Results  Component Value Date   HGBA1C 9.2 (A) 04/11/2023   There were no vitals  filed for this visit.  Lipid Panel     Component Value Date/Time   CHOL 180 11/02/2022 1116   TRIG 138 11/02/2022 1116   HDL 51 11/02/2022 1116   CHOLHDL 3.5 11/02/2022 1116   LDLCALC 105 (H) 11/02/2022 1116   Clinical Atherosclerotic Cardiovascular Disease (ASCVD): No  The 10-year ASCVD risk score (Arnett DK, et al., 2019) is: 28.2%   Values used to calculate the score:     Age: 44 years     Sex: Female     Is Non-Hispanic African American: Yes     Diabetic: Yes     Tobacco smoker: No     Systolic Blood Pressure: 131 mmHg     Is BP treated: Yes     HDL Cholesterol: 51 mg/dL     Total Cholesterol: 180 mg/dL   A/P: Diabetes longstanding currently uncontrolled. She has symptoms of hyperglycemia but no hypoglycemia. Patient is able to verbalize appropriate hypoglycemia management plan. Home fasting sugars are above goal. Stressed the need for a balance between better glycemic control and avoiding hypoglycemia/increasing her fall risk. Medication adherence appears appropriate. We will have her increase Lantus to 28 units daily starting tomorrow. If hypoglycemia occurs, she can decrease to 25 units.  -Increase Lantus to 28 units daily. -Continued SGLT2-I Jardiance 25 mg daily.  -Patient educated on purpose, proper use, and potential adverse effects of Lantus.  -Extensively discussed pathophysiology of diabetes, recommended lifestyle interventions, dietary effects on blood sugar control.  -Counseled on s/sx of and management of hypoglycemia.  -Next A1c anticipated 06/2023.  Written patient instructions provided. Patient verbalized understanding of treatment plan.  Total time in face to face counseling 30 minutes.    Follow-up:  -Follow up with me in 1 month  Butch Penny, PharmD, [place], CPP Clinical Pharmacist [facility] [phone]

## 2023-05-12 NOTE — Telephone Encounter (Signed)
 Called and left voicemail about this patietns wheelchair  Melrose Nakayama, ATP  [employer] [address] [place], [place] [address]  Phone: [phone]  Cell: [phone]  Fax:      [phone]- Breck Coons

## 2023-05-16 NOTE — Telephone Encounter (Signed)
 Received email from [facility], happy Friday! I received your message regarding Ms. Calk. We were able to get the new authorization through [facility] and her chair is scheduled to be delivered on 05/23/23.     Please let me know if you have any other questions or concerns. Thank you!!      ? Melrose Nakayama, ATP  [employer] [address] [place], [place] [address]  Phone: [phone]  Cell: [phone]  Fax:      [phone]- Breck Coons

## 2023-05-23 ENCOUNTER — Ambulatory Visit: Payer: 59 | Admitting: Cardiovascular Disease

## 2023-05-23 DIAGNOSIS — G231 Progressive supranuclear ophthalmoplegia [Steele-Richardson-Olszewski]: Secondary | ICD-10-CM | POA: Diagnosis not present

## 2023-05-27 ENCOUNTER — Other Ambulatory Visit: Payer: Self-pay | Admitting: Internal Medicine

## 2023-05-27 DIAGNOSIS — E1169 Type 2 diabetes mellitus with other specified complication: Secondary | ICD-10-CM

## 2023-05-29 ENCOUNTER — Other Ambulatory Visit: Payer: Self-pay | Admitting: Internal Medicine

## 2023-05-29 DIAGNOSIS — E1169 Type 2 diabetes mellitus with other specified complication: Secondary | ICD-10-CM

## 2023-05-30 ENCOUNTER — Telehealth: Payer: Self-pay

## 2023-05-30 NOTE — Patient Outreach (Signed)
 Aging Gracefully Program  05/30/2023  Kristi Campos 1950-11-24 [id number]   [facility] Evaluation Interviewer made contact with patient. Aging Gracefully survey completed.   Interviewer will send referral to RN and OT for follow up.    [facility]  Population Health Care Management Assistant  Direct Dial: [phone]  Fax: [phone] Website: [email or web address]

## 2023-05-30 NOTE — Telephone Encounter (Signed)
 Requested medications are due for refill today.  yes  Requested medications are on the active medications list.  yes  Last refill. 06/2022 #100 2 rf  Future visit scheduled.   no  Notes to clinic.  No protocol. Please review for refill.    Requested Prescriptions  Pending Prescriptions Disp Refills   ACCU-CHEK GUIDE TEST test strip [Pharmacy Med Name: ACCU-CHEK GUIDE TEST STRIPS 100S] 100 strip 2    Sig: USE AS DIRECTED THREE TIMES DAILY     There is no refill protocol information for this order

## 2023-05-31 ENCOUNTER — Ambulatory Visit: Attending: Cardiovascular Disease | Admitting: Cardiovascular Disease

## 2023-05-31 ENCOUNTER — Encounter: Payer: Self-pay | Admitting: Cardiovascular Disease

## 2023-05-31 VITALS — BP 130/75 | HR 80 | Ht 63.0 in | Wt 196.0 lb

## 2023-05-31 DIAGNOSIS — E782 Mixed hyperlipidemia: Secondary | ICD-10-CM | POA: Diagnosis not present

## 2023-05-31 DIAGNOSIS — I10 Essential (primary) hypertension: Secondary | ICD-10-CM | POA: Diagnosis not present

## 2023-05-31 NOTE — Patient Instructions (Signed)
 Medication Instructions:  Your physician recommends that you continue on your current medications as directed. Please refer to the Current Medication list given to you today.  *If you need a refill on your cardiac medications before your next appointment, please call your pharmacy*  Follow-Up: At [facility], you and your health needs are our priority.  As part of our continuing mission to provide you with exceptional heart care, our providers are all part of one team.  This team includes your primary Cardiologist (physician) and Advanced Practice Providers or APPs (Physician Assistants and Nurse Practitioners) who all work together to provide you with the care you need, when you need it.  Your next appointment:   We will see you on an as needed basis.  Provider:   Nanetta Batty, MD    We recommend signing up for the patient portal called "MyChart".  Sign up information is provided on this After Visit Summary.  MyChart is used to connect with patients for Virtual Visits (Telemedicine).  Patients are able to view lab/test results, encounter notes, upcoming appointments, etc.  Non-urgent messages can be sent to your provider as well.   To learn more about what you can do with MyChart, go to [email or web address].   Other Instructions       1st Floor: - Lobby - Registration  - Pharmacy  - Lab - Cafe  2nd Floor: - PV Lab - Diagnostic Testing (echo, CT, nuclear med)  3rd Floor: - Vacant  4th Floor: - TCTS (cardiothoracic surgery) - AFib Clinic - Structural Heart Clinic - Vascular Surgery  - Vascular Ultrasound  5th Floor: - HeartCare Cardiology (general and EP) - Clinical Pharmacy for coumadin, hypertension, lipid, weight-loss medications, and med management appointments    Valet parking services will be available as well.

## 2023-05-31 NOTE — Progress Notes (Signed)
 05/31/2023 Kristi Campos   10/31/1950  [id number]  Primary Physician Marcine Matar, MD Primary Cardiologist: Runell Gess MD Nicholes Calamity, [place]  HPI:  Kristi Campos is a 73 y.o.    moderately overweight divorced African-American female mother of 6 children, grandmother of 52 grandchildren referred by Dr. Allena Katz for evaluation of PAD.  She is accompanied by one of her daughters [name] today.  I last saw her in the office 04/28/2022.  The patient lives alone but her daughter lives down the street within walking distance.  Since I saw her a year ago her BNP has progressed, now decreasing her mobility and walking with a Scheid.  Her risk factors include treated hypertension, diabetes and hyperlipidemia.  There is no family history for heart disease.  She never had heart attack or stroke.  She denies shortness of breath but does get occasional atypical chest pain.  She has had lower extremity discomfort on the right side for several years which sounds somewhat atypical for claudication.  She also complains of lower extremity edema.  I obtained lower extremity Doppler studies on her 11/12/2019 which were entirely normal.  She was admitted to the hospital in August 2022 with chest pain with a negative work-up.  Her 2D echo was normal as was a coronary calcium score.  She said no recurrent symptoms.  She has since been diagnosed with a neurologic disorder similar to Parkinson's disease and is on carbidopa.   Since I saw her a year ago she is currently walking with a [place].  She has more fatigue and dyspnea although I do not think this is cardiovascular in nature given her prior normal studies.   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   atorvastatin (LIPITOR) 10 MG tablet Take 1  tablet (10 mg total) by mouth daily.   Blood Glucose Monitoring Suppl (ACCU-CHEK GUIDE) w/Device KIT Use to check blood sugar TID. Dx E11.69   carbidopa-levodopa (SINEMET IR) 25-100 MG tablet Take 1 tablet by mouth 3 (three) times daily.   diclofenac Sodium (VOLTAREN) 1 % GEL Apply 4 g topically 2 (two) times daily as needed.   insulin glargine (LANTUS SOLOSTAR) 100 UNIT/ML Solostar Pen Inject 28 Units into the skin daily.   Insulin Pen Needle (B-D ULTRAFINE III SHORT PEN) 31G X 8 MM MISC USE AS DIRECTED   JARDIANCE 25 MG TABS tablet TAKE 1 TABLET(25 MG) BY MOUTH DAILY   lidocaine (LIDODERM) 5 % Place 1 patch onto the skin daily. Remove & Discard patch within 12 hours or as directed by MD   losartan-hydrochlorothiazide (HYZAAR) 100-25 MG tablet TAKE 1/2 TABLET BY MOUTH DAILY   nitroGLYCERIN (NITROSTAT) 0.4 MG SL tablet Place 1 tablet (0.4 mg total) under the tongue every 5 (five) minutes as needed for chest pain.   pantoprazole (PROTONIX) 40 MG tablet TAKE 1 TABLET BY MOUTH DAILY AT NOON     Allergies  Allergen Reactions   Metformin And Related Diarrhea    Social History   Socioeconomic History   Marital status: Widowed    Spouse name: Not on file   Number of children: 4   Years of education: Not on file   Highest education level: Not on file  Occupational History  Employer: other   Occupation: retired    Comment: weave for living  Tobacco Use   Smoking status: Never   Smokeless tobacco: Never  Vaping Use   Vaping status: Never Used  Substance and Sexual Activity   Alcohol use: No    Alcohol/week: 0.0 standard drinks of alcohol   Drug use: No   Sexual activity: Not Currently    Partners: Male    Birth control/protection: Post-menopausal  Other Topics Concern   Not on file  Social History Narrative   Right handed    Lives at home alone ONE LEVEL   Caffeine1 cup daily   Social Drivers of Health   Financial Resource Strain: Medium Risk (12/09/2022)   Overall  Financial Resource Strain (CARDIA)    Difficulty of Paying Living Expenses: Somewhat hard  Food Insecurity: Food Insecurity Present (12/09/2022)   Hunger Vital Sign    Worried About Running Out of Food in the Last Year: Sometimes true    Ran Out of Food in the Last Year: Sometimes true  Transportation Needs: No Transportation Needs (04/29/2022)   PRAPARE - Administrator, Civil Service (Medical): No    Lack of Transportation (Non-Medical): No  Physical Activity: Inactive (12/09/2022)   Exercise Vital Sign    Days of Exercise per Week: 0 days    Minutes of Exercise per Session: 0 min  Stress: No Stress Concern Present (12/09/2022)   [employer] of Occupational Health - Occupational Stress Questionnaire    Feeling of Stress : Not at all  Social Connections: Socially Isolated (12/09/2022)   Social Connection and Isolation Panel [NHANES]    Frequency of Communication with Friends and Family: More than three times a week    Frequency of Social Gatherings with Friends and Family: Never    Attends Religious Services: Never    Database administrator or Organizations: No    Attends Engineer, structural: Not on file    Marital Status: Widowed  Intimate Partner Violence: Not At Risk (12/09/2022)   Humiliation, Afraid, Rape, and Kick questionnaire    Fear of Current or Ex-Partner: No    Emotionally Abused: No    Physically Abused: No    Sexually Abused: No     Review of Systems: General: negative for chills, fever, night sweats or weight changes.  Cardiovascular: negative for chest pain, dyspnea on exertion, edema, orthopnea, palpitations, paroxysmal nocturnal dyspnea or shortness of breath Dermatological: negative for rash Respiratory: negative for cough or wheezing Urologic: negative for hematuria Abdominal: negative for nausea, vomiting, diarrhea, bright red blood per rectum, melena, or hematemesis Neurologic: negative for visual changes, syncope, or  dizziness All other systems reviewed and are otherwise negative except as noted above.    Blood pressure 130/75, pulse 80, height 5\' 3"  (1.6 m), weight 196 lb (88.9 kg), SpO2 98%.  General appearance: alert and no distress Neck: no adenopathy, no carotid bruit, no JVD, supple, symmetrical, trachea midline, and thyroid not enlarged, symmetric, no tenderness/mass/nodules Lungs: clear to auscultation bilaterally Heart: regular rate and rhythm, S1, S2 normal, no murmur, click, rub or gallop Extremities: extremities normal, atraumatic, no cyanosis or edema Pulses: 2+ and symmetric Skin: Skin color, texture, turgor normal. No rashes or lesions Neurologic: Grossly normal  EKG EKG Interpretation Date/Time:  Tuesday May 31 2023 10:21:08 EDT Ventricular Rate:  80 PR Interval:  136 QRS Duration:  74 QT Interval:  364 QTC Calculation: 419 R Axis:   7  Text Interpretation: Normal sinus  rhythm Cannot rule out Anterior infarct (cited on or before 21-Apr-2022) When compared with ECG of 21-Apr-2022 13:01, No significant change was found Confirmed by Nanetta Batty [phone]) on 05/31/2023 10:36:10 AM    ASSESSMENT AND PLAN:   Essential hypertension History of essential hypertension her blood pressure measured today at 130/75.  She is on losartan/hydrochlorothiazide.  Hyperlipidemia History of hyperlipidemia on statin therapy with lipid profile performed 11/02/2022 revealing total cholesterol 180, LDL 105 and HDL of 51, acceptable for primary prevention given her coronary calcium score of 0.     Runell Gess MD FACP,FACC,FAHA, [facility] 05/31/2023 10:41 AM

## 2023-05-31 NOTE — Assessment & Plan Note (Signed)
 History of hyperlipidemia on statin therapy with lipid profile performed 11/02/2022 revealing total cholesterol 180, LDL 105 and HDL of 51, acceptable for primary prevention given her coronary calcium score of 0.

## 2023-05-31 NOTE — Assessment & Plan Note (Signed)
 History of essential hypertension her blood pressure measured today at 130/75.  She is on losartan/hydrochlorothiazide.

## 2023-06-03 ENCOUNTER — Other Ambulatory Visit: Admitting: Rehabilitation

## 2023-06-03 NOTE — Patient Outreach (Signed)
 Aging Gracefully Program  OT Initial Visit  06/03/2023  Kristi Campos April 26, 1950 [id number]  Visit:  1- Initial Visit  Start Time:  1100 End Time:  1215 Total Minutes:  75  CCAP: Typical Daily Routine: Typical Daily Routine:: learning to use new electric wheelchair. Daughter helps with housekeeping, bathing, and any support needed. What Types Of Care Problems Are You Having Throughout The Day?: none identified. But recognizes daughter's assistance What Kind Of Help Do You Receive?: help from family member Do You Think You Need Other Types Of Help?: no What Do You Think Would Make Everyday Life Easier For You?: not sure What Is A Good Day Like?: no complaints What Is A Bad Day Like?: no complaints Do You Have Time For Yourself?: yes Patient Reported Equipment: Patient Reported Equipment Currently Used: Grab Bars, Occupational hygienist, Psychologist, forensic Indoors/Getting Around the [employer]:  Uses a rollator, independent Functional Mobility-Walk A Block: Walk A Block: Unable To Do Do You:: N/A- Not Applicable Importance Of Learning New Strategies:: Not At All Functional Mobility-Maintain Balance While Showering: Maintaining Balance While Showering: A Little Difficulty Do You:: Use Both A Device And Personal Assistance Importance Of Learning New Strategies:: A Little Other Comments:: daughter helps her with bathing Functional Mobility-Stooping, Crouching, Kneeling To Retreive Item: Stooping, Crouching, or Kneeling To Retrieve Item: Unable To Do Do You:: Use Personal Assistance Importance Of Learning New Strategies:: Moderate Observation: Stoop, Crouch, or Kneel: N/O Safety: Moderate/Extreme Risk Efficiency: N/O Intervention: Yes Functional Mobility-Bending From Standing Position To Pick Up Clothing Off The Floor: Bending Over From Standing Position To Pick Up Clothing Off The Floor: A Little Difficulty Do You:: No Device/No Assistance Importance Of Learning New  Strategies:: A Little Observation: Bending Over From Standing Postion To Pick Up Clothing Off Of Floor: N/O Safety: A Little Risk Efficiency: N/O Functional Mobility-Reaching For Items Above Shoulder Level: Reaching For Items Above Shoulder Level: A Lot Of Difficulty Do You:: No Device/No Assistance Importance Of Learning New Strategies:: A Little Observation: Reaching For Items Above Shoulder Level: Independent Safety: A Little Risk Efficiency: Somewhat Intervention: Yes Functional Mobility-Climb 1 Flight Of Stairs: Climb 1 Flight Of Stairs: Unable To Do Do You:: N/A- Not Applicable Importance Of Learning New Strategies:: N/A Functional Mobility-Move In And Out Of Chair: Move In and Out Of A Chair: No Difficulty Do You:: Use A Device Importance Of Learning New Strategies:: Not At All Other Comments:: stands with Rollator Functional Mobility-Move In And Out Of Bed: Move In and Out Of Bed: A Little Difficulty Do You:: Use A Device Importance Of Learning New Strategies:: Not At All Other Comments:: uses rollator to assist balance once standing Functional Mobility-Move In And Out Of Bath/Shower: Move In And Out Of A Bath/Shower: Moderate Difficulty Do You:: Use Personal Assistance Importance Of Learning New Strategies:: Moderate Observation: Move In And Out Of Bath/Shower: N/O Safety: Moderate/Extreme Risk Efficiency: Somewhat Intervention: Yes Functional Mobility-Get On And Off Toilet: Getting Up From The Floor: Unable To Do Do You:: Use A Device Importance Of Learning New Strategies:: A Little Other Comments:: has life alert system Functional Mobility-Into And Out Of Car, Not Including Driving: Into  And Out Of Car, Not Including Driving: A Lot Of Difficulty Do You:: Use Personal Assistance Importance Of Learning New Strategies:: Moderate Observation: Into And Out Of Car, Not Including Driving: N/O Safety: A Little Risk Efficiency: Somewhat Intervention: Yes Other  Comments:: relies on someone to assist sit to stand. Daughter is trying to gain Zenaida Niece support with transportation system  to accommodate her electric wheelchair Functional Mobility-Other Mobility Difficulty:      Activities of Daily Living-Bathing/Showering: ADL-Bathing/Showering: No Difficulty Do You:: Use Both A Device And Personal Assistance Importance Of Learning New Strategies: A Little Other Comments:: daughter explains that she helps her mother with bathing to wash hair and soap back and rinse off. ADL Observation: Bathing/Showering: N/O Safety: A Little Risk Efficiency: Somewhat Intervention: Yes Other Comments:: try a LH sponge to assist ndeendence of reaching her back and feet. Hand held shower attachment needed Activities of Daily Living-Personal Hygiene and Grooming: Personal Hygiene and Grooming: No Difficulty Do You:: No Device/No Assistance Importance Of Learning New Strategies: Not At All Activities of Daily Living-Toilet Hygiene: Toilet Hygiene: No Difficulty Do You:: Use A Device Importance Of Learning New Strategies: Not At All Other Comments:: has commode hand rail Activities of Daily Living-Put On And Take Off Undergarments (Incl. Fasteners): Put On And Take Off Undergarments (Incl. Fasteners): No Difficulty Do You:: No Device/No Assistance Importance Of Learning New Strategies: Not At All Activities of Daily Living-Put On And Take Off Shirt/Dress/Coat (Incl. Fasteners): Put On And Take Off Shirt/Dress/Coat (Incl. Fasteners): No Difficulty Do You:: No Device/No Assistance Importance Of Learning New Strategies: Not At All Activities of Daily Living-Put On And Take Off Socks And Shoes: Put On And Take Off Socks And  Shoes: No Difficulty Do You:: No Device/No Assistance Importance Of Learning New Strategies: Not At All Activities of Daily Living-Feed Self: Feed Self: No Difficulty Do You:: No Device/No Assistance Importance Of Learning New Strategies: Not At  All Activities of Daily Living-Rest And Sleep: Rest and Sleep: No Difficulty Do You:: No Device/No Assistance Importance Of Learning New Strategies: Not At All Activities of Daily Living-Sexual Activity: Sexual  Activity: N/A Activities of Daily Living-Other Activity Identified:    Instrumental Activities of Daily Living-Light Homemaking (Laundry, Straightening Up, Vacuuming):  Do Light Homemaking (Laundry, Straightening Up, Vacuuming): Unable To Do Do You:: Use Personal Assistance Importance Of Learning New Strategies: Not At All Instrumental Activities of Daily Living-Making A Bed: Making a Bed: Unable To Do Do You:: Use Personal Assistance Importance Of Learning New Strategies: Not At All Instrumental Activities of Daily Living-Washing Dishes By Hand While Standing At The Sink: Washing Dishes By Hand While Standing At The Sink: No Difficulty Do You:: No Device/No Assistance Importance Of Learning New Strategies: Not At All Instrumental Activities of Daily Living-Grocery Shopping: Do Grocery Shopping: A Little Difficulty Do You:: Use Both A Device And Personal Assistance Importance Of Learning New Strategies: Not At All Instrumental Activities of Daily Living-Use Telephone: Use Telephone: No Difficulty Do You:: No Device/No Assistance Importance Of Learning New Strategies: Not At All Instrumental Activities of Daily Living-Financial Management: Financial Management: No Difficulty Do You:: Use Personal Assistance Importance Of Learning New Strategies: Not At All Instrumental Activities of Daily Living-Medications: Take Medications: No Difficulty Do You:: No Device/No Assistance Importance Of Learning New Strategies: Not At All Instrumental Activities of Daily Living-Health Management And Maintenance: Health Management & Maintenance: No Difficulty Do You:: Use Personal Assistance Importance Of Learning New Strategies: Not At All Instrumental Activities of Daily Living-Meal  Preparation and Clean-Up: Meal Preparation and Clean-Up: Moderate Difficulty Do You:: Use A Device Importance Of Learning New Strategies: Moderate IADL Observation: Meal Preparation And Clean Up: Independent Safety: A Little Risk Efficiency: N/O Intervention: No Instrumental Activities of Daily Living-Provide Care For Others/Pets: Care For Others/Pets: N/A Instrumental Activities of Daily Living-Take Part In Organized Social Activities: Take Part In Organized  Social Activities: No Difficulty Do You:: No Device/No Assistance Importance Of Learning New Strategies: Not At All Instrumental Activities of Daily Living-Leisure Participation: Leisure Participation: No Difficulty Do You:: No Device/No Assistance Importance Of Learning New Strategies: Not At All Instrumental Activities of Daily Living-Employment/Volunteer Activities: Employment/Volunteer Activities: No Difficulty Do You:: No Device/No Assistance Importance Of Learning New Strategies: Not At All Instrumental Activities of Daily Living-Other Identifies:    Readiness To Change Score:  Readiness to Change Score: 8  Home Environment Assessment: Outside Home Entry:: currently has temporary ramp Entryway/Foyer:: clear, appropriate lighting Bathroom:: has walk in shower with 2 grab bars, has hand held shower and an attached bench. Narrow doorway unable to take rollator into the bathroom Master Bedroom:: accessible Laundry:: daughter helps with landry Other Home Environment Concerns:: electircal wiring in bedroom  Durable Medical Equipment: Durable Medical Equipment: Agricultural consultant  Patient Education: Education Provided: Yes Education Details: Consent form, check for safety booklet, goals Person(s) Educated: Patient, Psychologist, sport and exercise) (daughter) Comprehension: Verbalized Understanding  Goals:  Goals Addressed             This Visit's Progress    AG OT patient stated       Safety and mobility in/out of the house     AG OT  Patient Stated       Increase mobility and independence in/out of the car     AG OT Patient Stated       Safety with reaching for balance and picking up items  (Bathroom grab bar, HH shower wand attachment, reacher/LH sponge        Post Clinical Reasoning: Client Action (Goal) One Interventions: Safety and mobility in and out of the home Client Action (Goal) Two Interventions: Increase mobility and independence moving in/out of the car Client Action (Goal) Three Interventions: Safety with reaching for balance and picking up items Clinician View Of Client Situation:: 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 Client does not report needing assistance, later with daughter it is identified that she needs help with bathing. Client has many modifications already in place like the step in shower, commode hand rail and hand held shower. Client is learning how to navigate with her power wheelchair Client View Of His/Her Situation:: Client is independent and is making adjustments. Daughter is very helpful and this is enough help for her. Client reports missing cooking and that is it more effortful, but can still do it. Next Visit Plan:: practice use of a reacher and LH sponge, review how to get up from a fall.

## 2023-06-07 ENCOUNTER — Other Ambulatory Visit: Payer: Self-pay | Admitting: *Deleted

## 2023-06-07 NOTE — Patient Outreach (Signed)
 Aging Gracefully Program  06/07/2023  Nima Bamburg 1950-07-10 [id number]   Telephone call made to mobile phone on file to Mrs. Perko. Spoke with daughter/DPR Wandra Mannan. [name] reports Mrs. Niehaus has trouble seeing out of her eye. Therefore, she may not answer the phone. [name] states she can schedule home visit with Clinical research associate on her mother's behalf.   Aging Gracefully RN home visit scheduled for Wednesday, April 16th at 11 am. Provided [name] writer's contact information.     Raiford Noble, MSN, RN, BSN [place]  [facility], [facility] RN Case Manager for Aging Gracefully Direct Dial: [phone]

## 2023-06-13 DIAGNOSIS — H35033 Hypertensive retinopathy, bilateral: Secondary | ICD-10-CM | POA: Diagnosis not present

## 2023-06-13 DIAGNOSIS — H2513 Age-related nuclear cataract, bilateral: Secondary | ICD-10-CM | POA: Diagnosis not present

## 2023-06-13 DIAGNOSIS — H532 Diplopia: Secondary | ICD-10-CM | POA: Diagnosis not present

## 2023-06-13 DIAGNOSIS — H0288B Meibomian gland dysfunction left eye, upper and lower eyelids: Secondary | ICD-10-CM | POA: Diagnosis not present

## 2023-06-13 DIAGNOSIS — H0288A Meibomian gland dysfunction right eye, upper and lower eyelids: Secondary | ICD-10-CM | POA: Diagnosis not present

## 2023-06-13 DIAGNOSIS — E119 Type 2 diabetes mellitus without complications: Secondary | ICD-10-CM | POA: Diagnosis not present

## 2023-06-14 ENCOUNTER — Encounter: Payer: Self-pay | Admitting: Pharmacist

## 2023-06-14 ENCOUNTER — Ambulatory Visit (HOSPITAL_BASED_OUTPATIENT_CLINIC_OR_DEPARTMENT_OTHER): Admitting: Pharmacist

## 2023-06-14 ENCOUNTER — Ambulatory Visit: Payer: 59 | Attending: Internal Medicine

## 2023-06-14 VITALS — Ht 63.0 in | Wt 195.0 lb

## 2023-06-14 DIAGNOSIS — Z5987 Material hardship due to limited financial resources, not elsewhere classified: Secondary | ICD-10-CM | POA: Diagnosis not present

## 2023-06-14 DIAGNOSIS — Z0000 Encounter for general adult medical examination without abnormal findings: Secondary | ICD-10-CM

## 2023-06-14 DIAGNOSIS — Z5912 Inadequate housing utilities: Secondary | ICD-10-CM

## 2023-06-14 DIAGNOSIS — E669 Obesity, unspecified: Secondary | ICD-10-CM

## 2023-06-14 DIAGNOSIS — Z794 Long term (current) use of insulin: Secondary | ICD-10-CM

## 2023-06-14 DIAGNOSIS — E1169 Type 2 diabetes mellitus with other specified complication: Secondary | ICD-10-CM

## 2023-06-14 DIAGNOSIS — Z5941 Food insecurity: Secondary | ICD-10-CM

## 2023-06-14 DIAGNOSIS — Z7984 Long term (current) use of oral hypoglycemic drugs: Secondary | ICD-10-CM

## 2023-06-14 NOTE — Patient Instructions (Signed)
 Kristi Campos , Thank you for taking time to come for your Medicare Wellness Visit. I appreciate your ongoing commitment to your health goals. Please review the following plan we discussed and let me know if I can assist you in the future.   Referrals/Orders/Follow-Ups/Clinician Recommendations: An order has been placed for assistance with food and utility insecurities. The office will call you and daughter to see what all is needed.  Please eep maintaining your health by keeping your appointments with Dr. Lincoln Renshaw and any specialists that you may see.  Call us  if you need anything.  Have a great year!!!!  This is a list of the screening recommended for you and due dates:  Health Maintenance  Topic Date Due   Hepatitis C Screening  Never done   COVID-19 Vaccine (3 - Pfizer risk series) 05/20/2020   Eye exam for diabetics  12/04/2022   Yearly kidney health urinalysis for diabetes  06/24/2023   Flu Shot  09/30/2023   Hemoglobin A1C  10/09/2023   Complete foot exam   11/02/2023   Yearly kidney function blood test for diabetes  04/10/2024   Medicare Annual Wellness Visit  06/13/2024   Mammogram  05/01/2025   DTaP/Tdap/Td vaccine (2 - Tdap) 11/23/2028   Colon Cancer Screening  06/17/2029   Pneumonia Vaccine  Completed   DEXA scan (bone density measurement)  Completed   Zoster (Shingles) Vaccine  Completed   HPV Vaccine  Aged Out   Meningitis B Vaccine  Aged Out    Advanced directives: (In Chart) A copy of your advanced directives are scanned into your chart should your provider ever need it.  Next Medicare Annual Wellness Visit scheduled for next year: Yes

## 2023-06-14 NOTE — Progress Notes (Cosign Needed Addendum)
 Because this visit was a virtual/telehealth visit,  certain criteria was not obtained, such a blood pressure, CBG if applicable, and timed get up and go. Any medications not marked as "taking" were not mentioned during the medication reconciliation part of the visit. Any vitals not documented were not able to be obtained due to this being a telehealth visit or patient was unable to self-report a recent blood pressure reading due to a lack of equipment at home via telehealth. Vitals that have been documented are verbally provided by the patient.   Subjective:   Kristi Campos is a 73 y.o. who presents for a Medicare Wellness preventive visit.  Visit Complete: Virtual I connected with  Kristi Campos on 06/14/23 by a audio enabled telemedicine application and verified that I am speaking with the correct person using two identifiers.  Patient Location: Home  Provider Location: Office/Clinic  I discussed the limitations of evaluation and management by telemedicine. The patient expressed understanding and agreed to proceed.  Vital Signs: Because this visit was a virtual/telehealth visit, some criteria may be missing or patient reported. Any vitals not documented were not able to be obtained and vitals that have been documented are patient reported.  VideoDeclined- This patient declined Librarian, academic. Therefore the visit was completed with audio only.  Persons Participating in Visit:  Daughter and patient was present during visit.  AWV Questionnaire: No: Patient Medicare AWV questionnaire was not completed prior to this visit.  Cardiac Risk Factors include: advanced age (>69men, >54 women);sedentary lifestyle;diabetes mellitus;dyslipidemia;hypertension;obesity (BMI >30kg/m2);family history of premature cardiovascular disease     Objective:    Today's Vitals   06/14/23 0912  Weight: 195 lb (88.5 kg)  Height: 5\' 3"  (1.6 m)  PainSc: 0-No pain   Body mass  index is 34.54 kg/m.     06/14/2023    9:15 AM 04/08/2023    2:58 PM 12/16/2022   10:05 AM 06/10/2022    8:18 AM 04/29/2022   12:21 PM 12/08/2021    7:30 AM 11/26/2021    6:49 AM  Advanced Directives  Does Patient Have a Medical Advance Directive? Yes No No No Yes Yes No  Type of Estate agent of [place];Living will    Healthcare Power of Attorney    Does patient want to make changes to medical advance directive? No - Patient declined        Copy of Healthcare Power of Attorney in Chart? Yes - validated most recent copy scanned in chart (See row information)    No - copy requested    Would patient like information on creating a medical advance directive?  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   atorvastatin (LIPITOR) 10 MG tablet Take 1 tablet (10 mg total) by mouth daily.   Blood Glucose Monitoring Suppl (ACCU-CHEK GUIDE) w/Device KIT Use to check blood sugar TID. Dx E11.69   carbidopa-levodopa (SINEMET IR) 25-100 MG tablet Take 1 tablet by mouth 3 (three) times daily.   diclofenac Sodium (VOLTAREN) 1 % GEL Apply 4 g topically 2 (two) times daily as needed.   insulin glargine (  LANTUS SOLOSTAR) 100 UNIT/ML Solostar Pen Inject 28 Units into the skin daily.   Insulin Pen Needle (B-D ULTRAFINE III SHORT PEN) 31G X 8 MM MISC USE AS DIRECTED   JARDIANCE 25 MG TABS tablet TAKE 1 TABLET(25 MG) BY MOUTH DAILY   lidocaine (LIDODERM) 5 % Place 1 patch onto the skin daily. Remove & Discard patch within 12 hours or as directed by MD   losartan-hydrochlorothiazide (HYZAAR) 100-25 MG tablet TAKE 1/2 TABLET BY MOUTH DAILY   nitroGLYCERIN  (NITROSTAT) 0.4 MG SL tablet Place 1 tablet (0.4 mg total) under the tongue every 5 (five) minutes as needed for chest pain.   pantoprazole (PROTONIX) 40 MG tablet TAKE 1 TABLET BY MOUTH DAILY AT NOON   [DISCONTINUED] simvastatin (ZOCOR) 10 MG tablet Take 10 mg by mouth at bedtime.   No facility-administered encounter medications on file as of 06/14/2023.    Allergies (verified) Metformin and related   History: Past Medical History:  Diagnosis Date   Diabetes mellitus without complication (HCC)    Fibroid uterus    GERD (gastroesophageal reflux disease)    Hypertension    Vaginal Pap smear, abnormal    Past Surgical History:  Procedure Laterality Date   ARTERY BIOPSY Left 11/26/2021   Procedure: BIOPSY TEMPORAL ARTERY;  Surgeon: Carlene Che, MD;  Location: [facility] OR;  Service: Vascular;  Laterality: Left;   COLONOSCOPY     I & D EXTREMITY Right 08/09/2013   Procedure: MINOR IRRIGATION AND DEBRIDEMENT EXTREMITY;  Surgeon: Amelie Baize, MD;  Location: [facility];  Service: Orthopedics;  Laterality: Right;  long       wound class 4   Family History  Problem Relation Age of Onset   Bipolar disorder Mother    Hypertension Sister    Hypertension Sister    Hypertension Sister    Hypertension Sister    Hypertension Sister    Hypertension Sister    Hypertension Daughter    Diabetes Son    Prostate cancer Son    BRCA 1/2 Neg Hx    Breast cancer Neg Hx    Social History   Socioeconomic History   Marital status: Widowed    Spouse name: Not on file   Number of children: 4   Years of education: 35   Highest education level: 11th grade  Occupational History    Employer: other   Occupation: retired    Comment: [employer] for living  Tobacco Use   Smoking status: Never   Smokeless tobacco: Never  Vaping Use   Vaping status: Never Used  Substance and Sexual Activity   Alcohol use: No    Alcohol/week: 0.0 standard drinks of alcohol   Drug use: No   Sexual  activity: Not Currently    Partners: Male    Birth control/protection: Post-menopausal  Other Topics Concern   Not on file  Social History Narrative   Right handed    Lives at home alone ONE LEVEL   Caffeine1 cup daily   Social Drivers of Health   Financial Resource Strain: High Risk (06/14/2023)   Overall Financial Resource Strain (CARDIA)    Difficulty of Paying Living Expenses: Very hard  Food Insecurity: Food Insecurity Present (06/14/2023)   Hunger Vital Sign    Worried About Running Out of Food in the Last Year: Sometimes true    Ran Out of Food in the Last Year: Sometimes true  Transportation Needs: No Transportation Needs (06/14/2023)   PRAPARE - Transportation  Lack of Transportation (Medical): No    Lack of Transportation (Non-Medical): No  Physical Activity: Inactive (06/14/2023)   Exercise Vital Sign    Days of Exercise per Week: 0 days    Minutes of Exercise per Session: 0 min  Stress: No Stress Concern Present (06/14/2023)   [employer] of Occupational Health - Occupational Stress Questionnaire    Feeling of Stress : Not at all  Social Connections: Moderately Isolated (06/14/2023)   Social Connection and Isolation Panel [NHANES]    Frequency of Communication with Friends and Family: More than three times a week    Frequency of Social Gatherings with Friends and Family: More than three times a week    Attends Religious Services: More than 4 times per year    Active Member of [employer] or Organizations: No    Attends Banker Meetings: Never    Marital Status: Widowed    Tobacco Counseling Counseling given: Not Answered    Clinical Intake:  Pre-visit preparation completed: Yes  Pain : No/denies pain Pain Score: 0-No pain     BMI - recorded: 34.54 Nutritional Status: BMI > 30  Obese Nutritional Risks: None Diabetes: Yes CBG done?: No Did pt. bring in CBG monitor from home?: No  Lab Results  Component Value Date   HGBA1C 9.2 (A)  04/11/2023   HGBA1C 8.2 (A) 11/02/2022   HGBA1C 8.5 (A) 06/24/2022     How often do you need to have someone help you when you read instructions, pamphlets, or other written materials from your doctor or pharmacy?: 1 - Never What is the last grade level you completed in school?: 11TH GRADE  Interpreter Needed?: No  Information entered by :: Lorien Shingler N. Nafis Farnan, LPN.   Activities of Daily Living     06/14/2023    9:18 AM  In your present state of health, do you have any difficulty performing the following activities:  Hearing? 0  Vision? 1  Comment CATRACTS BOTH EYES  Difficulty concentrating or making decisions? 0  Comment SOME  Walking or climbing stairs? 1  Dressing or bathing? 0  Doing errands, shopping? 1  Preparing Food and eating ? Y  Using the Toilet? N  In the past six months, have you accidently leaked urine? Y  Comment PT WEARS DEPENDS  Do you have problems with loss of bowel control? Y  Comment PT WEARS DEPENDS  Managing your Medications? Y  Managing your Finances? Y  Housekeeping or managing your Housekeeping? Y    Patient Care Team: Marcine Matar, MD as PCP - General (Internal Medicine) Runell Gess, MD as PCP - Cardiology (Cardiology) Sheran Luz, MD as Consulting Physician (Physical Medicine and Rehabilitation) Tat, Octaviano Batty, DO as Consulting Physician (Neurology) Garen Grams, OT (Rehabilitation) [facility] as Consulting Physician (Ophthalmology)  Indicate any recent Medical Services you may have received from other than [facility] providers in the past year (date may be approximate).     Assessment:   This is a routine wellness examination for Kristi Campos.  Hearing/Vision screen Hearing Screening - Comments:: Denies hearing difficulties.  Vision Screening - Comments:: Wears rx glasses; bilateral cataracts- up to date with routine eye exams with [facility] (last seen 06/13/2023)    Goals Addressed              This Visit's Progress    Client understands the importance of follow-up with providers by attending scheduled visits  Depression Screen     06/14/2023    9:28 AM 05/30/2023    3:34 PM 04/11/2023    2:21 PM 12/09/2022   11:07 AM 11/02/2022    9:53 AM 06/24/2022    9:16 AM 04/29/2022   12:22 PM  PHQ 2/9 Scores  PHQ - 2 Score 0 0 0 0 0 0 0  PHQ- 9 Score 0  0 0 3 0     Fall Risk     06/14/2023    9:15 AM 06/03/2023    2:26 PM 12/16/2022   10:05 AM 11/02/2022    9:53 AM 10/04/2022    3:41 PM  Fall Risk   Falls in the past year? 1 1 1 1  0  Number falls in past yr: 1 1 1 1  0  Injury with Fall? 0 0 0 0 0  Risk for fall due to : History of fall(s);Impaired balance/gait;Orthopedic patient Other (Comment)  History of fall(s) No Fall Risks  Risk for fall due to: Comment  PSP. Patient has life alert system with wearbale push button     Follow up Falls prevention discussed;Falls evaluation completed  Falls evaluation completed Falls evaluation completed     MEDICARE RISK AT HOME:  Medicare Risk at Home Any stairs in or around the home?: No If so, are there any without handrails?: No Home free of loose throw rugs in walkways, pet beds, electrical cords, etc?: Yes Adequate lighting in your home to reduce risk of falls?: Yes Life alert?: Yes Use of a cane, Terrio or w/c?: Yes Grab bars in the bathroom?: Yes Shower chair or bench in shower?: Yes Elevated toilet seat or a handicapped toilet?: Yes  TIMED UP AND GO:  Was the test performed?  No  Cognitive Function: 6CIT completed    06/14/2023    9:33 AM 03/31/2021    8:45 AM  MMSE - Mini Mental State Exam  Not completed: Unable to complete   Orientation to time  5  Orientation to Place  5  Registration  3  Attention/ Calculation  5  Recall  3  Language- name 2 objects  2  Language- repeat  1  Language- follow 3 step command  3  Language- read & follow direction  1  Write a sentence  0  Copy design  0  Total score  28         04/29/2022   12:24 PM  6CIT Screen  What Year? 0 points  What month? 0 points  What time? 0 points  Count back from 20 0 points  Months in reverse 0 points  Repeat phrase 8 points  Total Score 8 points    Immunizations Immunization History  Administered Date(s) Administered   Fluad Quad(high Dose 65+) 11/24/2018, 11/23/2021, 11/15/2022   Influenza,inj,Quad PF,6+ Mos 11/26/2019   Influenza,inj,quad, With Preservative 03/15/2018   Influenza-Unspecified 01/13/2021   PFIZER(Purple Top)SARS-COV-2 Vaccination 04/01/2020, 04/22/2020   Pneumococcal Conjugate (Pcv15) 03/31/2021   Pneumococcal Polysaccharide-23 10/24/2018   Td 11/24/2018   Zoster Recombinant(Shingrix) 10/09/2018, 12/25/2018    Screening Tests Health Maintenance  Topic Date Due   Hepatitis C Screening  Never done   COVID-19 Vaccine (3 - Pfizer risk series) 05/20/2020   OPHTHALMOLOGY EXAM  12/04/2022   Diabetic kidney evaluation - Urine ACR  06/24/2023   INFLUENZA VACCINE  09/30/2023   HEMOGLOBIN A1C  10/09/2023   FOOT EXAM  11/02/2023   Diabetic kidney evaluation - eGFR measurement  04/10/2024   Medicare Annual Wellness (AWV)  06/13/2024   MAMMOGRAM  05/01/2025   DTaP/Tdap/Td (2 - Tdap) 11/23/2028   Colonoscopy  06/17/2029   Pneumonia Vaccine 20+ Years old  Completed   DEXA SCAN  Completed   Zoster Vaccines- Shingrix  Completed   HPV VACCINES  Aged Out   Meningococcal B Vaccine  Aged Out    Health Maintenance  Health Maintenance Due  Topic Date Due   Hepatitis C Screening  Never done   COVID-19 Vaccine (3 - Pfizer risk series) 05/20/2020   OPHTHALMOLOGY EXAM  12/04/2022   Diabetic kidney evaluation - Urine ACR  06/24/2023   Health Maintenance Items Addressed: Yes  Additional Screening:  Vision Screening: Recommended annual ophthalmology exams for early detection of glaucoma and other disorders of the eye.  Dental Screening: Recommended annual dental exams for proper oral hygiene  Community  Resource Referral / Chronic Care Management: CRR required this visit?  No   CCM required this visit?  No     Plan:     I have personally reviewed and noted the following in the patient's chart:   Medical and social history Use of alcohol, tobacco or illicit drugs  Current medications and supplements including opioid prescriptions. Patient is not currently taking opioid prescriptions. Functional ability and status Nutritional status Physical activity Advanced directives List of other physicians Hospitalizations, surgeries, and ER visits in previous 12 months Vitals Screenings to include cognitive, depression, and falls Referrals and appointments  In addition, I have reviewed and discussed with patient certain preventive protocols, quality metrics, and best practice recommendations. A written personalized care plan for preventive services as well as general preventive health recommendations were provided to patient.     Margette Sheldon, LPN   1/61/0960   After Visit Summary: (MyChart) Due to this being a telephonic visit, the after visit summary with patients personalized plan was offered to patient via MyChart   Notes: Please refer to Routing Comments.

## 2023-06-14 NOTE — Progress Notes (Signed)
 I connected with  Kristi Campos on 06/14/23 by a video enabled telemedicine application and verified that I am speaking with the correct person using two identifiers.   I discussed the limitations of evaluation and management by telemedicine. The patient expressed understanding and agreed to proceed.  Location of myself: office    Location of patient: home   Persons participating in the call: myself and the patient  S:     No chief complaint on file.  Kristi Campos is a 73 y.o. female who presents for diabetes evaluation, education, and management. 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   Patient was referred and last seen by Primary Care Provider, Dr. Laural Benes, on 04/11/2023. A1c at that visit was 9.2%. I last saw her 05/12/2023.   Today, patient is in good spirits. She endorses adherence to her insulin and Jardiance. She is taking 28units as prescribed and reports better blood sugar control. Denies any hypoglycemia.   Family/Social History:  -Fhx: HTN -Tobacco: never smoked  Current diabetes medications include: Lantus 28 units once daily, Jardiance 25 mg daily.  Patient reports adherence to taking all medications as prescribed.   Patient denies hypoglycemic events.   Reported home fasting blood sugars: 131. Denies any readings > 200.  Reported 2 hour post-meal/random blood sugars: not checking  Patient reports nocturia (nighttime urination).  Patient denies neuropathy (nerve pain). Patient reports visual changes. Patient reports self foot exams.   Patient reported dietary habits:  -Eats 2-3 meals  -Patient doesn't eat a lot of carb, drinks apple and orange juice.   Patient-reported exercise habits: -Patient doesn't have a current exercising regimen.   O:  Lab Results  Component Value Date   HGBA1C 9.2 (A) 04/11/2023   There were no vitals filed for this visit.  Lipid  Panel     Component Value Date/Time   CHOL 180 11/02/2022 1116   TRIG 138 11/02/2022 1116   HDL 51 11/02/2022 1116   CHOLHDL 3.5 11/02/2022 1116   LDLCALC 105 (H) 11/02/2022 1116   Clinical Atherosclerotic Cardiovascular Disease (ASCVD): No  The 10-year ASCVD risk score (Arnett DK, et al., 2019) is: 27.9%   Values used to calculate the score:     Age: 82 years     Sex: Female     Is Non-Hispanic African American: Yes     Diabetic: Yes     Tobacco smoker: No     Systolic Blood Pressure: 130 mmHg     Is BP treated: Yes     HDL Cholesterol: 51 mg/dL     Total Cholesterol: 180 mg/dL   A/P: Diabetes longstanding currently uncontrolled. She denies any current symptoms of hyperglycemia or hypoglycemia. Patient is able to verbalize appropriate hypoglycemia management plan. Home fasting sugars are better. Stressed the need for a balance between better glycemic control and avoiding hypoglycemia/increasing her fall risk. Medication adherence appears appropriate. We will have her continue Lantus and Jardiance as prescribed. -Continue Lantus 28 units daily. -Continued SGLT2-I Jardiance 25 mg daily.  -Patient educated on purpose, proper use, and potential adverse effects of Lantus.  -Extensively discussed pathophysiology of diabetes, recommended lifestyle interventions, dietary effects on blood sugar control.  -Counseled on s/sx of and management of hypoglycemia.  -Next A1c anticipated 06/2023.   Written patient instructions provided. Patient verbalized understanding of treatment plan.  Total time in face to face counseling 30 minutes.  Follow-up:  -Follow up with me in 1 month  Marene Shape, PharmD, [place], CPP Clinical Pharmacist [facility] [phone]

## 2023-06-14 NOTE — Progress Notes (Unsigned)
 Assessment/Plan:   1.  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  Understands that there is an increased risk of falls, aspiration as subsequent morbidity and mortality from this disease compared to Parkinson's disease.             -Patient did not do well off of levodopa, so wants to continue carbidopa/levodopa 25/100, 1 tablet 3 times per day             -Patient does not drive.  She does live by herself, but daughter lives next door.        2.  Eyelid opening apraxia             -Not interested in Botox right now.   3.  Vision change/generalized fatigue jaw claudication             -TA biopsy negative             -Her vision issues really sound most consistent with PSP.  She has diplopia, which is likely due to PSP, but the blurry vision may be due to uncontrolled diabetes in combination with her cataracts.  She is scheduled to have cataract surgery soon.      4.  Dizziness             -Improved after blood pressure medications decreased   5.  Dysphagia             -She had a modified barium swallow May 11, 2022 and December 20, 2022.  There is no deterioration between the 2 studies.  This demonstrated moderately impaired oral phase and mildly impaired pharyngeal phase.  No aspiration was observed.  Mechanical soft diet with thin liquids was recommended.             -she reports that she doesn't want feeding tube but would want intubation/chest compressions (she initially said she would not but changed her mind).  Her daughter is going to be working on changing advanced directives  6.  Uncontrolled diabetes  - Her hemoglobin A1c is 9.2.  Primary care is adjusting her medications and she is following up with.  We spent some time discussing what a proper diabetic diet might look like and she was quite surprised!   Subjective:   Kristi Campos was seen today in follow-up for PSP.  She is accompanied by her daughter who supplements the  history.  We have been continuing to work on her electric wheelchair, the situation of which has been going on for well over a year now.  They report today that her chair was delivered about 3 weeks ago.  She is still working on trying to learn how to use it.  She has had no falls though since having it.  Her daughter did call in mid February stating that patient was having more jaw pain and trouble chewing.  Her daughter had stated her primary care told her to call here, but we had previously worked up extensively for neurologic causes and that workup was negative, including prior biopsy.  I did copy her primary care physician and on my response to the patient and her primary care said she was going to follow-up with the patient.  The patient was seen in her primary care office about 5 days after that and the patient did mention it, but it is unclear what workup was done.  Labs were done for  her dyspnea, which were largely unremarkable, with the exception of her hemoglobin A1c which was very high at 9.2!.  Chest x-ray was unremarkable.  She was in the emergency room February 7 after a fall where she hit her head on the back of a chair.  CT brain was negative.   Current prescribed movement disorder medications: Carbidopa/levodopa 25/100, 1 tablet 3 times per day. Zanaflex, 2 mg at bed   ALLERGIES:   Allergies  Allergen Reactions   Metformin And Related Diarrhea    CURRENT MEDICATIONS:  Outpatient Encounter Medications as of 06/16/2023  Medication Sig   ACCU-CHEK GUIDE TEST test strip USE AS DIRECTED THREE TIMES DAILY   AMBULATORY NON FORMULARY MEDICATION Please dispense 1 power wheelchair PSP patient DX code G23.1   aspirin EC 81 MG tablet Take 81 mg by mouth daily.   atorvastatin (LIPITOR) 10 MG tablet Take 1 tablet (10 mg total) by mouth daily.   Blood Glucose Monitoring Suppl (ACCU-CHEK GUIDE) w/Device KIT Use to check blood sugar TID. Dx E11.69   diclofenac Sodium (VOLTAREN) 1 % GEL Apply 4  g topically 2 (two) times daily as needed.   insulin glargine (LANTUS SOLOSTAR) 100 UNIT/ML Solostar Pen Inject 28 Units into the skin daily.   Insulin Pen Needle (B-D ULTRAFINE III SHORT PEN) 31G X 8 MM MISC USE AS DIRECTED   JARDIANCE 25 MG TABS tablet TAKE 1 TABLET(25 MG) BY MOUTH DAILY   lidocaine (LIDODERM) 5 % Place 1 patch onto the skin daily. Remove & Discard patch within 12 hours or as directed by MD   losartan-hydrochlorothiazide (HYZAAR) 100-25 MG tablet TAKE 1/2 TABLET BY MOUTH DAILY   nitroGLYCERIN (NITROSTAT) 0.4 MG SL tablet Place 1 tablet (0.4 mg total) under the tongue every 5 (five) minutes as needed for chest pain.   pantoprazole (PROTONIX) 40 MG tablet TAKE 1 TABLET BY MOUTH DAILY AT NOON   tiZANidine (ZANAFLEX) 4 MG capsule Take 4 mg by mouth 3 (three) times daily.   [DISCONTINUED] carbidopa-levodopa (SINEMET IR) 25-100 MG tablet Take 1 tablet by mouth 3 (three) times daily.   carbidopa-levodopa (SINEMET IR) 25-100 MG tablet Take 1 tablet by mouth 3 (three) times daily.   [DISCONTINUED] simvastatin (ZOCOR) 10 MG tablet Take 10 mg by mouth at bedtime.   No facility-administered encounter medications on file as of 06/16/2023.    Objective:   PHYSICAL EXAMINATION:    VITALS:   Vitals:   06/16/23 0846  BP: 124/76  Pulse: 71  SpO2: 99%  Weight: 195 lb 8 oz (88.7 kg)      GEN:  The patient appears stated age and is in NAD. HEENT:  Normocephalic, atraumatic.  The mucous membranes are moist. The superficial temporal arteries are without ropiness or tenderness.   CV  RRR Lungs:  CTAB Neck:  no bruits.  Chin toward check   Neurological examination:  Orientation: The patient is alert and oriented x3.  Cranial nerves: There is good facial symmetry. There is facial hypomimia.  Extraocular muscles are intact.  there is some eye opening apraxia.  Downgaze was just slightly decreased. No significant square wave jerks.  The visual fields are full to confrontational  testing. The speech is fluent and dysarthric. some stuttering (same as previous).  She has mild trouble with the gutteral sounds.  Soft palate rises symmetrically and there is no tongue deviation. Hearing is intact to conversational tone. Sensation: Sensation is intact to light touch Motor: Strength is at least antigravity x 4  Movement examination: Tone: There is normal tone in the bilateral upper extremities.  The tone in the lower extremities is normal.  Abnormal movements: As above, she has some eyelid opening apraxia.  She has some pulling of the right face intermittently. Coordination:  There is decremation, with any form of RAMS, including alternating supination and pronation of the forearm, hand opening and closing, finger taps, heel taps and toe taps on the L Gait and Station: not tested today  I have reviewed and interpreted the following labs independently    Chemistry      Component Value Date/Time   NA 146 (H) 04/11/2023 1515   K 5.0 04/11/2023 1515   CL 106 04/11/2023 1515   CO2 25 04/11/2023 1515   BUN 15 04/11/2023 1515   CREATININE 1.01 (H) 04/11/2023 1515      Component Value Date/Time   CALCIUM 9.7 04/11/2023 1515   ALKPHOS 82 11/02/2022 1116   AST 14 11/02/2022 1116   ALT 9 11/02/2022 1116   BILITOT 0.4 11/02/2022 1116       Lab Results  Component Value Date   WBC 7.2 04/11/2023   HGB 13.4 04/11/2023   HCT 41.9 04/11/2023   MCV 94 04/11/2023   PLT 260 04/11/2023    Lab Results  Component Value Date   TSH 0.962 04/11/2023   Lab Results  Component Value Date   ESRSEDRATE 32 11/23/2021   Lab Results  Component Value Date   CRP 7 11/23/2021   Lab Results  Component Value Date   HGBA1C 9.2 (A) 04/11/2023     Total time spent on today's visit was 30 minutes, including both face-to-face time and nonface-to-face time.  Time included that spent on review of records (prior notes available to me/labs/imaging if pertinent), discussing treatment and  goals, answering patient's questions and coordinating care.  Cc:  Lawrance Presume, MD

## 2023-06-15 ENCOUNTER — Encounter: Payer: Self-pay | Admitting: *Deleted

## 2023-06-15 ENCOUNTER — Other Ambulatory Visit: Payer: Self-pay | Admitting: *Deleted

## 2023-06-15 DIAGNOSIS — E1169 Type 2 diabetes mellitus with other specified complication: Secondary | ICD-10-CM

## 2023-06-15 NOTE — Patient Instructions (Signed)
 Visit Information  Thank you for taking time to visit with me today. Please don't hesitate to contact me if I can be of assistance to you before our next scheduled home appointment.  Following are the goals we discussed today:  (Copy and paste patient goals from clinical care plan here)  Our next appointment is on Jul 19, 2023 at 11 am.  If you are experiencing a Mental Health or Behavioral Health Crisis or need someone to talk to, please call the Suicide and Crisis Lifeline: 988 call the [place] National Suicide Prevention Lifeline: (782) 383-4822 or TTY: 732-400-5348 TTY 276-171-2122) to talk to a trained counselor call 1-800-273-TALK (toll free, 24 hour hotline) go to [facility] [address] [place] [phone]) call 911    Goals Addressed               This Visit's Progress     AG RN (pt-stated)        06/15/2023  Assessment: Kristi Campos and Kristi present during home visit. Kristi Campos reports pain 0 out of 10. Kristi Campos reports KristiCampos has not had any falls since receiving her motorized wheelchair. 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 States PSP disorder is a lot like Parkinson's but PSP is more severe. Kristi Campos follows closely with her PCP every 3 months. Also closely follows up with her neurologist. Kristi Campos and Kristi reports HgbA1C is elevated and will like to get it down. Also follows with Pharm D at PCP office. Kristi reports she will like to see Kristi Campos move about outside and on the ramp more often. Kristi Campos reports being nervous that she will get stuck outside when alone. Kristi Campos also report they are interested in medications being delivered. Kristi Campos inquired about another way to obtain adult briefs.   Interventions: Encouraged Kristi Campos to utilize [employer] benefit to purchase adult briefs online or in [employer]. Advised Kristi Campos to ask her PCP and  neurologist to send prescriptions to [facility], after confirming steps to take for medication delivery with insurance. Encouraged Kristi Campos to practice "driving" wheelchair outside when family is there and to review motorized wheelchair manual to become more familiar with controls. Provided chronic disease booklet, calendar, and writer's contact information. Discussed referral for [facility] CCM services to discuss DM management. Kristi Campos agreeable as she is primary contact and HCPOA.   Plan: Scheduled next home visit for May 20th at 11 am. Make [facility] referral for CCM services for DM   CLIENT/RN ACTION PLAN - GENERIC - (smartphrase AGRNGENERIC)  Registered Nurse:  Raiford Noble Date: 06/15/2023  Client Name: Kristi Campos Client ID:    Target Area:  Kristi Campos   Why Problem May Occur: Nervous about getting stuck outside on the ramp while operating the motorized wheelchair    Target Goal: To become more comfortable, familiar, and to learn how to use the motorized wheelchair over the next 160 days    STRATEGIES Coping Strategies: Ideas  Review motorized w/c manual Review manual with Kristi; Reach out to supplier for questions   Practice outside when family present Practice going outside in the wheelchair more often when family is present  Familiarize with controls and techniques Try different or new controls and techniques regularly         Prevention Ideas                  PRACTICE It is important to practice the strategies so we  can determine if they will be effective in helping to reach the goal.    Follow these specific recommendations:        If strategy does not work the first time, try it again.     We may make some changes over the next few sessions.      Nolberto Batty, MSN, RN, BSN [place]  [facility], Healthy Communities RN Case Manager for Aging Gracefully Direct Dial: [phone]                                      Nolberto Batty, MSN, RN, BSN [place]  [facility], Healthy Communities RN Case Manager for Aging Gracefully Direct Dial: [phone]

## 2023-06-15 NOTE — Patient Outreach (Signed)
 Aging Gracefully Program  RN Visit  06/15/2023  Kristi Campos January 25, 1951 [id number]  Visit:  RN Visit Number: 1- Initial Visit  RN TIME CALCULATION: Start TIme:  RN Start Time Calculation: 1100 End Time:  RN Stop Time Calculation: 1230 Total Minutes:  RN Time Calculation: 90  Readiness To Change Score:  Readiness to Change Score: 10  Universal RN Interventions: Calendar Distribution: Yes Exercise Review: No Medications: Yes Medication Changes: Yes Mood: Yes Pain: Yes PCP Advocacy/Support: No Fall Prevention: Yes Incontinence: Yes Clinician View Of Kristi Campos Situation: Arrived for home visit. Kristi Campos in motorized wheelchair and daughter Kristi Campos present. Home without clutter. Kristi Campos View Of His/Her Situation: Kristi Campos has no complaints or issues to report. Recently received motorized wheelchair and is still getting adjusted.  Healthcare Provider Communication: Did Surveyor, mining With [employer] Provider?: No Healthcare Provider Response According To Kristi Campos: N/A  Clinician View of Kristi Campos Situation: Clinician View Of Kristi Campos Situation: Arrived for home visit. Kristi Campos in motorized wheelchair and daughter Kristi Campos present. Home without clutter. Temporary ramp in place at the front door. Kristi Campos's View of His/Her Situation: Kristi Campos View Of His/Her Situation: Kristi Campos has no complaints or issues to report. Recently received motorized wheelchair and is still getting adjusted.  Medication Assessment: Do You Have Any Problems Paying For Medications?: No Where Does Kristi Campos Store Medications?: Other: (bedroom drawer) Can Kristi Campos Read Pill Bottles?: Yes Does Kristi Campos Use A Pillbox?: Yes Does Anyone Assist Kristi Campos In Filling Pillbox?: No Does Anyone Assist Kristi Campos In Taking Medications?: No Do You Take Vitamin D?: No Total Number Of Medications That The Kristi Campos Takes: 6 Does Kristi Campos Have Any Questions Or Concerns About Medictions?: No Is Kristi Campos Complaining Of Any Symptoms That Could Be  Side Effects To Medications?: No Any Possible Changes In Medication Regimen?: No  OT Update: Pending [facility] contracts and assessments.  Session Summary: Kristi Campos doing well over all. Lives alone. Daughter Kristi Campos provides support and assistance. Kristi Campos is glad to now have her motorized wheelchair and temporary ramp. Daughter Kristi Campos states she would like to see her mother get outside more in her motorized wheel chair.    Goals Addressed               This Visit's Progress     AG RN (pt-stated)        06/15/2023  Assessment: Kristi Campos and daughter present during home visit. Kristi Campos reports pain 0 out of 10. Daughter Kristi Campos reports Kristi Campos has not had any falls since receiving her motorized wheelchair. 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 States PSP disorder is a lot like Parkinson's but PSP is more severe. Kristi. Wantz follows closely with her PCP every 3 months. Also closely follows up with her neurologist. Kristi Campos and daughter reports HgbA1C is elevated and will like to get it down. Also follows with Pharm D at PCP office. Daughter reports she will like to see Kristi Campos move about outside and on the ramp more often. Kristi Campos reports being nervous that she will get stuck outside when alone. Kristi Campos also report they are interested in medications being delivered. Kristi Campos inquired about another way to obtain adult briefs.   Interventions: Encouraged Kristi Campos to utilize [employer] benefit to purchase adult briefs online or in [employer]. Advised Kristi Campos to ask her PCP and neurologist to send prescriptions to [facility], after confirming steps to take for medication delivery with insurance. Encouraged Kristi Campos to practice "driving" wheelchair outside  when family is there and to review motorized wheelchair manual to become more familiar with controls. Provided chronic disease booklet, calendar, and writer's contact information.  Discussed referral for [facility] CCM services to discuss DM management. Kristi Campos agreeable as she is primary contact and HCPOA.   Plan: Scheduled next home visit for May 20th at 11 am. Make [facility] referral for CCM services for DM   Kristi Campos/RN ACTION PLAN - GENERIC - (smartphrase AGRNGENERIC)  Registered Nurse:  Nolberto Batty Date: 06/15/2023  Kristi Campos Name: Kristi Campos Kristi Campos ID:    Target Area:  [place]   Why Problem May Occur: Nervous about getting stuck outside on the ramp while operating the motorized wheelchair    Target Goal: To become more comfortable, familiar, and to learn how to use the motorized wheelchair over the next 160 days    STRATEGIES Coping Strategies: Ideas  Review motorized w/c manual Review manual with daughter; Reach out to supplier for questions   Practice outside when family present Practice going outside in the wheelchair more often when family is present  Familiarize with controls and techniques Try different or new controls and techniques regularly         Prevention Ideas                  PRACTICE It is important to practice the strategies so we can determine if they will be effective in helping to reach the goal.    Follow these specific recommendations:        If strategy does not work the first time, try it again.     We may make some changes over the next few sessions.      Nolberto Batty, MSN, RN, BSN [place]  [facility], Healthy Communities RN Case Manager for Aging Gracefully Direct Dial: [phone]                                     Nolberto Batty, MSN, RN, BSN [place]  [facility], Healthy Communities RN Case Manager for Aging Gracefully Direct Dial: [phone]

## 2023-06-16 ENCOUNTER — Encounter: Payer: Self-pay | Admitting: Neurology

## 2023-06-16 ENCOUNTER — Telehealth: Payer: Self-pay | Admitting: *Deleted

## 2023-06-16 ENCOUNTER — Ambulatory Visit (INDEPENDENT_AMBULATORY_CARE_PROVIDER_SITE_OTHER): Payer: 59 | Admitting: Neurology

## 2023-06-16 VITALS — BP 124/76 | HR 71 | Wt 195.5 lb

## 2023-06-16 DIAGNOSIS — G231 Progressive supranuclear ophthalmoplegia [Steele-Richardson-Olszewski]: Secondary | ICD-10-CM | POA: Diagnosis not present

## 2023-06-16 DIAGNOSIS — R1319 Other dysphagia: Secondary | ICD-10-CM | POA: Diagnosis not present

## 2023-06-16 DIAGNOSIS — E1169 Type 2 diabetes mellitus with other specified complication: Secondary | ICD-10-CM

## 2023-06-16 DIAGNOSIS — E669 Obesity, unspecified: Secondary | ICD-10-CM

## 2023-06-16 MED ORDER — CARBIDOPA-LEVODOPA 25-100 MG PO TABS: 1.0000 | ORAL_TABLET | Freq: Three times a day (TID) | ORAL | 2 refills | Status: AC

## 2023-06-16 NOTE — Progress Notes (Signed)
 Complex Care Management Note  Care Guide Note 06/16/2023 Name: Kristi Campos MRN: [id number] DOB: 1950/09/22  Kristi Campos is a 73 y.o. year old female who sees Lawrance Presume, MD for primary care. I reached out to [employer] daughter Kristi Campos by phone today to offer complex care management services.  Ms. Fein daughter Kristi Campos was given information about Complex Care Management services today including:   The Complex Care Management services include support from the care team which includes your Nurse Care Manager, Clinical Social Worker, or Pharmacist.  The Complex Care Management team is here to help remove barriers to the health concerns and goals most important to you. Complex Care Management services are voluntary, and the patient may decline or stop services at any time by request to their care team member.   Complex Care Management Consent Status: Patient agreed to services and verbal consent obtained. daughter Kristi Campos   Follow up plan:  Telephone appointment with complex care management team member scheduled for:  5/15  Encounter Outcome:  Patient Scheduled  Barnie Bora  [facility]  [facility] [place] Guide  Direct Dial: [phone]  Fax [phone]

## 2023-06-20 ENCOUNTER — Telehealth: Payer: Self-pay | Admitting: *Deleted

## 2023-06-20 NOTE — Progress Notes (Signed)
 Complex Care Management Note Care Guide Note  06/20/2023 Name: Kristi Campos MRN: [id number] DOB: 03-11-1950   Complex Care Management Outreach Attempts: An unsuccessful telephone outreach was attempted today to offer the patient information about available complex care management services.  Follow Up Plan:  Additional outreach attempts will be made to offer the patient complex care management information and services.   Encounter Outcome:  No Answer  Gust Leghorn  [facility] HealthPopulation Health Care Guide  Direct Dial [phone] Fax:[phone] Website: [email or web address]

## 2023-06-21 ENCOUNTER — Telehealth: Payer: Self-pay | Admitting: *Deleted

## 2023-06-21 NOTE — Progress Notes (Signed)
 Complex Care Management Note Care Guide Note  06/21/2023 Name: Kristi Campos MRN: [id number] DOB: [birth year]  Kristi Campos is a 73 y.o. year old female who is a primary care patient of Lawrance Presume, MD . The community resource team was consulted for assistance with Food Insecurity Utilities  SDOH screenings and interventions completed:  Yes     SDOH Interventions Today    Flowsheet Row Most Recent Value  SDOH Interventions   Food Insecurity Interventions Community Resources Provided, [id number] Referral  [Patient gets 23 in food stamps provided [place] food [place] to daughter]  Utilities Interventions Community Resources Provided  Financial Strain Interventions Community Resources Provided        Care guide performed the following interventions: Patient provided with information about care guide support team and interviewed to confirm resource needs.  Follow Up Plan:  No further follow up planned at this time. The patient has been provided with needed resources.  Encounter Outcome:  Patient Visit Completed Kristi Campos  [facility] HealthPopulation Health Care Guide  Direct Dial :[phone] Fax:[phone] Website: [email or web address]

## 2023-06-23 DIAGNOSIS — G231 Progressive supranuclear ophthalmoplegia [Steele-Richardson-Olszewski]: Secondary | ICD-10-CM | POA: Diagnosis not present

## 2023-07-08 ENCOUNTER — Other Ambulatory Visit: Payer: Self-pay | Admitting: Rehabilitation

## 2023-07-08 NOTE — Patient Outreach (Signed)
 Aging Gracefully Program  OT Follow-Up Visit  07/08/2023  Kristi Campos 1951/02/01 [id number]  Visit:  2- Second Visit  Start Time:  1300 End Time:  1330 Total Minutes:  30  Readiness to Change Score :  Readiness to Change Score: 10   Patient Education: Education Provided: Yes Education Details: How to get up from a fall handout. Issued car assist grab bar educated use and left directions. Person(s) Educated: Patient, Child(ren) (Spoke with daughter [name] via phone to explain car assist handle) Comprehension: Verbalized Understanding  Goals:   Goals Addressed             This Visit's Progress    AG OT patient stated   On track    Safety and mobility in/out of the house     AG OT Patient Stated   On track    Increase mobility and independence in/out of the car 07/08/23: OT educated and demonstrated use of car assist handle to client. Usually rides with daughter and not able to practice with her car today. Left the item for family to trial. Also spoke with daughter via phone to explain the handle.     AG OT Patient Stated   On track    Safety with reaching for balance and picking up items  (Bathroom grab bar, HH shower wand attachment, reacher/LH sponge) 07/08/23: trial OT demo reacher. Discuss options for LH sponge, OT to order. Also address placement of grab bar in bathroom with demonstration, OT left tape for [facility]. Will practice with items next visit        Post Clinical Reasoning: Client Action (Goal) Two Interventions: Demonstrate and issue car assist handle for trial to assist standing up from the car seat. Did Client Try?: No (no car available, rides with others.) Client Action (Goal) Three Interventions: Trial reacher and decide which type for hand grasp. Disucss and view images of LH sponge for independnce as showering. Identify placement of grab bar in bathroom to assist balance after stepping out of the shower. Did Client Try?: Yes Targeted Problem Area  Status: A Little Better Clinician View Of Client Situation:: Client engaged to trial items and repsonsive to discussion. Able to stand in the bathroom to demonstrate arm reach for placement of vertical grab bar outside the shower. Due to no vehicle for transportation, agreed to leave the car assist handle and can try with family. Pictures of use on the box and OT verbal explain to client and her daughter (via phone). Client View Of His/Her Situation:: Client is able to explain needs. appears more confident with walking today and what is needed for balance. Identifies areas where she needs assitance, daughter is able to help. Next Visit Plan:: Issue reacher and practice use, LH sponge, f/u use of car assist handle

## 2023-07-13 NOTE — Progress Notes (Unsigned)
 S:     No chief complaint on file.  Kristi Campos is a 73 y.o. female who presents for diabetes evaluation. 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 Candi Chafe). Last visit with pharmacist on 06/14/2023 where patient reported adherence to insulin  regimen and denied hypoglycemia. Patient reported goal home blood sugar readings with no readings above 200 mg/dL. She was instructed to continue Lantus  28 U daily and Jardiance  25 mg daily. Last A1c on 04/11/2023 was 9.2%.   Today, she reports in good spirits with her daughter. Denies any changes in symptoms since her last visit. No NV, abdominal pain, or diarrhea. She has some nocturia and visual changes that are at baseline. No changes since I saw her last month. She endorses adherence to her insulin  and Jardiance . Denies any S/sx of hypoglycemia.   Current diabetes medications include: Lantus  28 units once daily, Jardiance  25 mg daily.   Reported home fasting blood sugars: 101 - 186 -Denies any readings > 200.  Reported 2 hour post-meal/random blood sugars: not checking  Patient reports baseline nocturia with no changes.  Patient denies neuropathy (nerve pain). Patient denies any visual changes since last visit. Patient reports self foot exams.   Patient reports adherence to taking all medications as prescribed.   Patient reported dietary habits:  -No changes since her last visit -Eats 2-3 meals  -Patient doesn't eat a lot of carbs -Drinks apple and orange juice.   Patient-reported exercise habits -Patient doesn't have a current exercising regimen.   Family/Social History:  -Fhx: HTN -Tobacco: never smoked  O:  Lab Results  Component Value Date   HGBA1C 8.8 (A) 07/14/2023   There were no vitals filed for this visit.  Lipid Panel     Component Value Date/Time   CHOL 180 11/02/2022 1116   TRIG 138 11/02/2022 1116   HDL 51 11/02/2022 1116   CHOLHDL 3.5  11/02/2022 1116   LDLCALC 105 (H) 11/02/2022 1116   Clinical Atherosclerotic Cardiovascular Disease (ASCVD): No  The 10-year ASCVD risk score (Arnett DK, et al., 2019) is: 26%   Values used to calculate the score:     Age: 51 years     Sex: Female     Is Non-Hispanic African American: Yes     Diabetic: Yes     Tobacco smoker: No     Systolic Blood Pressure: 124 mmHg     Is BP treated: Yes     HDL Cholesterol: 51 mg/dL     Total Cholesterol: 180 mg/dL   A/P: Diabetes longstanding currently uncontrolled. She denies any current symptoms of hypoglycemia. Patient is able to verbalize appropriate hypoglycemia management plan. Home fasting sugars are above goal.  Stressed the need for a balance between better glycemic control and avoiding hypoglycemia/increasing her fall risk. Medication adherence appears appropriate. We will have her increase Lantus  dose and continue Jardiance  as prescribed. -Increase Lantus  to 32 units daily. -Continued SGLT2-I Jardiance  25 mg daily.  -Patient educated on purpose, proper use, and potential adverse effects of Lantus .  -Extensively discussed pathophysiology of diabetes, recommended lifestyle interventions, dietary effects on blood sugar control.  -Counseled on s/sx of and management of hypoglycemia.  -Next A1c due 09/2023.  Written patient instructions provided. Patient verbalized understanding of treatment plan.  Total time in face to face counseling 30 minutes.    Follow-up:  -Follow up with me in 1 month  Kristi Campos  PharmD Candidate Class of 2026 [employer]  Kristi Campos, PharmD, [place], CPP Clinical Pharmacist [facility] [phone]

## 2023-07-14 ENCOUNTER — Ambulatory Visit: Attending: Internal Medicine | Admitting: Pharmacist

## 2023-07-14 ENCOUNTER — Other Ambulatory Visit: Payer: Self-pay

## 2023-07-14 ENCOUNTER — Encounter: Payer: Self-pay | Admitting: Pharmacist

## 2023-07-14 DIAGNOSIS — Z7984 Long term (current) use of oral hypoglycemic drugs: Secondary | ICD-10-CM

## 2023-07-14 DIAGNOSIS — E1169 Type 2 diabetes mellitus with other specified complication: Secondary | ICD-10-CM

## 2023-07-14 DIAGNOSIS — Z794 Long term (current) use of insulin: Secondary | ICD-10-CM

## 2023-07-14 DIAGNOSIS — E669 Obesity, unspecified: Secondary | ICD-10-CM

## 2023-07-14 LAB — POCT GLYCOSYLATED HEMOGLOBIN (HGB A1C): HbA1c, POC (controlled diabetic range): 8.8 % — AB (ref 0.0–7.0)

## 2023-07-14 NOTE — Patient Outreach (Signed)
 Complex Care Management   Visit Note  07/14/2023  Name:  Kristi Campos MRN: [id number] DOB: 13-Sep-1950  Situation: Referral received for Complex Care Management related to Diabetes with Complications I obtained verbal consent from Patient POA.  Visit completed with POA/Patient on the phone  Background:   Past Medical History:  Diagnosis Date   Arthritis    Cataract    Chronic kidney disease    Diabetes mellitus without complication (HCC)    Fibroid uterus    GERD (gastroesophageal reflux disease)    Glaucoma    Hyperlipidemia    Hypertension    Neuromuscular disorder (HCC)    Thyroid  disease    Vaginal Pap smear, abnormal     Assessment: Patient Reported Symptoms:  Cognitive Cognitive Status: Able to follow simple commands, Alert and oriented to person, place, and time, Other: ([place])      Neurological Neurological Review of Symptoms: No symptoms reported    HEENT HEENT Symptoms Reported: No symptoms reported      Cardiovascular Cardiovascular Symptoms Reported: No symptoms reported Does patient have uncontrolled Hypertension?: No    Respiratory Respiratory Symptoms Reported: No symptoms reported    Endocrine Is patient diabetic?: Yes Is patient checking blood sugars at home?: Yes Endocrine Conditions: Diabetes Endocrine Management Strategies: Medication therapy  Gastrointestinal Gastrointestinal Symptoms Reported: Constipation Additional Gastrointestinal Details: sometimes Gastrointestinal Conditions: Constipation Gastrointestinal Management Strategies: Medication therapy Gastrointestinal Self-Management Outcome: 4 (good) Gastrointestinal Comment: uses miralax     Genitourinary Genitourinary Symptoms Reported: Incontinence Additional Genitourinary Details: uses briefs Genitourinary Conditions: Incontinence Genitourinary Management Strategies: Incontinence garment/pad  Integumentary      Musculoskeletal Musculoskelatal Symptoms Reviewed: Difficulty  walking, Weakness Musculoskeletal Conditions: Back pain Musculoskeletal Management Strategies: Medical device Falls in the past year?: Yes Number of falls in past year: 2 or more Was there an injury with Fall?: No Fall Risk Category Calculator: 2 Patient Fall Risk Level: Moderate Fall Risk Patient at Risk for Falls Due to: Impaired balance/gait Fall risk Follow up: Falls evaluation completed, Education provided, Falls prevention discussed (powered wheelchair has helped)  Psychosocial       Quality of Family Relationships: supportive Do you feel physically threatened by others?: No      07/14/2023    9:39 AM  Depression screen PHQ 2/9  Decreased Interest 0  Down, Depressed, Hopeless 0  PHQ - 2 Score 0    There were no vitals filed for this visit.  Medications Reviewed Today     Reviewed by Augustin Leber, RN (Registered Nurse) on 07/14/23 at [phone]  Med List Status: <None>   Medication Order Taking? Sig Documenting Provider Last Dose Status Informant  ACCU-CHEK GUIDE TEST test strip [id number] Yes USE AS DIRECTED THREE TIMES DAILY Lawrance Presume, MD Taking Active   AMBULATORY NON [facility] MEDICATION [id number] Yes Please dispense 1 power wheelchair PSP patient DX code G23.1 Tat, Von Grumbling, DO Taking Active   aspirin  EC 81 MG tablet [id number] Yes Take 81 mg by mouth daily. [provider] Taking Active Family Member  atorvastatin  (LIPITOR) 10 MG tablet [id number] Yes Take 1 tablet (10 mg total) by mouth daily. Lawrance Presume, MD Taking Active   Blood Glucose Monitoring Suppl (ACCU-CHEK GUIDE) w/Device KIT [id number] Yes Use to check blood sugar TID. Dx E11.69 Lawrance Presume, MD Taking Active Family Member  carbidopa -levodopa  (SINEMET  IR) 25-100 MG tablet [id number] Yes Take 1 tablet by mouth 3 (three) times daily. Shirline Dover, DO Taking Active   diclofenac  Sodium (VOLTAREN ) 1 % GEL  [id number] Yes Apply 4 g topically 2 (two) times daily as needed. Newlin, Enobong, MD  Taking Active   insulin  glargine (LANTUS  SOLOSTAR) 100 UNIT/ML Solostar Pen [id number] Yes Inject 28 Units into the skin daily. Lawrance Presume, MD Taking Active   Insulin  Pen Needle (B-D ULTRAFINE III SHORT PEN) 31G X 8 MM MISC [id number] Yes USE AS DIRECTED Lawrance Presume, MD Taking Active   JARDIANCE  25 MG TABS tablet [id number] Yes TAKE 1 TABLET(25 MG) BY MOUTH DAILY Lawrance Presume, MD Taking Active   lidocaine  (LIDODERM ) 5 % [id number] Yes Place 1 patch onto the skin daily. Remove & Discard patch within 12 hours or as directed by MD Joaquin Mulberry, MD Taking Active   losartan -hydrochlorothiazide (HYZAAR) 100-25 MG tablet [id number] Yes TAKE 1/2 TABLET BY MOUTH DAILY Lawrance Presume, MD Taking Active   nitroGLYCERIN  (NITROSTAT ) 0.4 MG SL tablet [id number] Yes Place 1 tablet (0.4 mg total) under the tongue every 5 (five) minutes as needed for chest pain. Maylene Spear, MD Taking Active Family Member           Med Note Murrel Arnt, Adeline Hone Apr 27, 2021 10:51 AM) Not taking but has on hand  pantoprazole  (PROTONIX ) 40 MG tablet [id number] Yes TAKE 1 TABLET BY MOUTH DAILY AT Mitzie Anda, MD Taking Active     Discontinued 02/28/20 1147 tiZANidine  (ZANAFLEX ) 4 MG capsule [id number] Yes Take 4 mg by mouth 3 (three) times daily. [provider] Taking Active             Recommendation:   PCP Follow-up  Follow Up Plan:   Telephone follow up appointment with care management team member scheduled for:  08/17/23  10 am  Augustin Leber RN, BSN, [facility]  [facility], [facility] Health  Care Coordinator Phone: [phone]

## 2023-07-14 NOTE — Patient Instructions (Signed)
 Visit Information  Thank you for taking time to visit with me today. Please don't hesitate to contact me if I can be of assistance to you before our next scheduled appointment.  Your next care management appointment is scheduled for:  08/17/23  10 am    Please call the care guide team at [phone] if you need to cancel, schedule, or reschedule an appointment.   Please call 1-800-273-TALK (toll free, 24 hour hotline) if you are experiencing a Mental Health or Behavioral Health Crisis or need someone to talk to.  Augustin Leber RN, BSN, [facility] [place]  [facility], [facility] Health  Care Coordinator Phone: [phone]

## 2023-07-15 LAB — MICROALBUMIN / CREATININE URINE RATIO
Creatinine, Urine: 63.4 mg/dL
Microalb/Creat Ratio: <5 mg/g{creat} (ref 0–29)
Microalbumin, Urine: <3 ug/mL

## 2023-07-16 ENCOUNTER — Ambulatory Visit: Payer: Self-pay | Admitting: Internal Medicine

## 2023-07-19 ENCOUNTER — Encounter: Payer: Self-pay | Admitting: *Deleted

## 2023-07-19 ENCOUNTER — Other Ambulatory Visit: Payer: Self-pay | Admitting: *Deleted

## 2023-07-19 ENCOUNTER — Telehealth: Payer: Self-pay | Admitting: *Deleted

## 2023-07-19 NOTE — Patient Instructions (Signed)
 Visit Information  Thank you for taking time to visit with me today. Please don't hesitate to contact me if I can be of assistance to you before our next scheduled home appointment.  Following are the goals we discussed today:   Goals Addressed               This Visit's Progress     AG RN (pt-stated)        06/15/2023  Assessment: Ms. Hinote and daughter present during home visit. Ms. Bachtel reports pain 0 out of 10. Daughter Archibald Beard reports Ms.Allmendinger has not had any falls since receiving her motorized wheelchair. States Ms. 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 States PSP disorder is a lot like Parkinson's but PSP is more severe. Ms. Santini follows closely with her PCP every 3 months. Also closely follows up with her neurologist. Ms. Kissner and daughter reports HgbA1C is elevated and will like to get it down. Also follows with Pharm D at PCP office. Daughter reports she will like to see Ms. Wake move about outside and on the ramp more often. Ms. Spizzirri reports being nervous that she will get stuck outside when alone. [name] also report they are interested in medications being delivered. [name] inquired about another way to obtain adult briefs.   Interventions: Encouraged Ms. Knightly to utilize [employer] benefit to purchase adult briefs online or in [employer]. Advised [name] to ask her PCP and neurologist to send prescriptions to [facility], after confirming steps to take for medication delivery with insurance. Encouraged Ms. Erman to practice "driving" wheelchair outside when family is there and to review motorized wheelchair manual to become more familiar with controls. Provided chronic disease booklet, calendar, and writer's contact information. Discussed referral for [facility] CCM services to discuss DM management. [name] agreeable as she is primary contact and HCPOA.   Plan: Scheduled next home visit for May 20th at 11 am. Make [facility] referral  for CCM services for DM   CLIENT/RN ACTION PLAN - GENERIC - (smartphrase AGRNGENERIC)  Registered Nurse:  Nolberto Batty Date: 06/15/2023  Client Name: Sol Duos Client ID:    Target Area:  [place]   Why Problem May Occur: Nervous about getting stuck outside on the ramp while operating the motorized wheelchair    Target Goal: To become more comfortable, familiar, and to learn how to use the motorized wheelchair over the next 160 days    STRATEGIES Coping Strategies: Ideas  Review motorized w/c manual 07/19/23 Review manual with daughter; Reach out to supplier for questions   Practice outside when family present 07/19/23 Practice going outside in the wheelchair more often when family is present  Familiarize with controls and techniques 07/19/23 Try different or new controls and techniques regularly         Prevention Ideas                  PRACTICE It is important to practice the strategies so we can determine if they will be effective in helping to reach the goal.    Follow these specific recommendations:        If strategy does not work the first time, try it again.     We may make some changes over the next few sessions.      Nolberto Batty, MSN, RN, BSN [facility], Healthy Communities RN Case Manager for Aging Gracefully Direct Dial : [phone]      07/19/2023  Assessment: Met  with Mrs. Brees in her home with daughter Archibald Beard. Mrs. Geeslin reports she has not sat outside. States she has not sat outside because she does not want to sit outside alone. [name] reports recent Endocrinologist appointment went well. States insulin  increased to 32 units. [name] confirms she has spoke with [facility] RNCM and appreciates the resources and education provided. [name] reports she plans to utilize food finder app for food resources. [name] states she has not had time to follow up with PCP about prescribing medication for mail order pharmacy with [facility].  Interventions: Demonstrated home exercises. Provided home exercises booklet.  Mrs. Mealey demonstrated return exercises partially. Encouraged Mrs. Wanninger to perform exercises regularly. Provided DM education booklet. Provided findhelp.com website to [employer] for additional community resources. Encouraged [name] to request PCP to prescribe medications for home delivery thru [facility]. Encouraged Mrs. Jacob to go outside when family/visitors are present.  Plan: Scheduled next home visit on June 25th at 11am.    Nolberto Batty, MSN, RN, BSN [place]  [facility], Healthy Communities RN Case Manager for Aging Gracefully Direct Dial : [phone]                                                             Our next appointment is on June 25th at 11am  If you are experiencing a Mental Health or Behavioral Health Crisis or need someone to talk to, please call the Suicide and Crisis Lifeline: 988 call the [place]  National Suicide Prevention Lifeline: 8673968525 or TTY: 715 646 3128 TTY (678)435-9333) to talk to a trained counselor call 1-800-273-TALK (toll free, 24 hour hotline) go to [facility] [address], [place] [phone]) call 911   Nolberto Batty, MSN, RN, BSN [place]  [facility], Healthy Communities RN Case Manager for Aging Gracefully Direct Dial : [phone]

## 2023-07-19 NOTE — Progress Notes (Signed)
 Complex Care Management Note Care Guide Note  07/19/2023 Name: Kristi Campos MRN: [id number] DOB: 1950/07/02   Complex Care Management Outreach Attempts: An unsuccessful telephone outreach was attempted today to offer the patient information about available complex care management services.  Follow Up Plan:  Additional outreach attempts will be made to offer the patient complex care management information and services.   Encounter Outcome:  No Answer  Gust Leghorn  [facility] HealthPopulation Health Care Guide  Direct Dial [phone] Fax:[phone] Website: [email or web address]

## 2023-07-19 NOTE — Patient Outreach (Signed)
 Aging Gracefully Program  RN Visit  07/19/2023  Kristi Campos 07-19-1950 [id number]  Visit:  RN Visit Number: 2- Second Visit  RN TIME CALCULATION: Start TIme:  RN Start Time Calculation: 1100 End Time:  RN Stop Time Calculation: 1140 Total Minutes:  RN Time Calculation: 40  Readiness To Change Score:  Readiness to Change Score: 8.67  Universal RN Interventions: Calendar Distribution: No Exercise Review: Yes Medications: Yes Medication Changes: Yes Mood: Yes Pain: Yes PCP Advocacy/Support: No Fall Prevention: Yes Incontinence: Yes Clinician View Of Client Situation: Arrived for home visit. Daughter Kristi Campos present. Kristi Campos answered the door. Ambulating independently with rollator. Home without clutter. Client View Of His/Her Situation: Kristi Campos denies having pain or any falls. Reports rotating rollator and motorized wheelchair. Has medical alert device.  Healthcare Provider Communication: Did Surveyor, mining With Kristi Campos Provider?: No Healthcare Provider Response According to RN: N/A According to Client, Did PCP Report Communication With An Aging Gracefully RN?: No Healthcare Provider Response According To Client: N/A  Clinician View of Client Situation: Clinician View Of Client Situation: Arrived for home visit. Daughter Kristi Campos present. Kristi Campos answered the door. Ambulating independently with rollator. Home without clutter. Client's View of His/Her Situation: Client View Of His/Her Situation: Kristi Campos denies having pain or any falls. Reports rotating rollator and motorized wheelchair. Has medical alert device.  Medication Assessment: Reviewed.     OT Update: Pending [facility] contracts and assessments.   Session Summary: Doing well overall. Denies any issues or complaints.    Goals Addressed               This Visit's Progress     AG RN (pt-stated)        06/15/2023  Assessment: Kristi Campos and daughter present during home visit. Kristi Campos  reports pain 0 out of 10. Daughter Kristi Campos reports Kristi Campos has not had any falls since receiving her motorized wheelchair. 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 States PSP disorder is a lot like Parkinson's but PSP is more severe. Kristi Campos follows closely with her PCP every 3 months. Also closely follows up with her neurologist. Kristi Campos and daughter reports HgbA1C is elevated and will like to get it down. Also follows with Pharm D at PCP office. Daughter reports she will like to see Kristi Campos move about outside and on the ramp more often. Kristi Campos reports being nervous that she will get stuck outside when alone. Kristi Campos also report they are interested in medications being delivered. Kristi Campos inquired about another way to obtain adult briefs.   Interventions: Encouraged Kristi Campos to utilize [employer] benefit to purchase adult briefs online or in [employer]. Advised Kristi Campos to ask her PCP and neurologist to send prescriptions to Kristi Campos, after confirming steps to take for medication delivery with insurance. Encouraged Kristi Campos to practice "driving" wheelchair outside when family is there and to review motorized wheelchair manual to become more familiar with controls. Provided chronic disease booklet, calendar, and writer's contact information. Discussed referral for Kristi Campos CCM services to discuss DM management. Kristi Campos agreeable as she is primary contact and HCPOA.   Plan: Scheduled next home visit for May 20th at 11 am. Make Kristi Campos referral for CCM services for DM   CLIENT/RN ACTION PLAN - GENERIC - (smartphrase AGRNGENERIC)  Registered Nurse:  Kristi Campos Date: 06/15/2023  Client Name: Kristi Campos Client ID:    Target Area:  GENERIC   Why Problem May Occur: Nervous about getting stuck  outside on the ramp while operating the motorized wheelchair    Target Goal: To become more comfortable, familiar, and to learn how to use the  motorized wheelchair over the next 160 days    STRATEGIES Coping Strategies: Ideas  Review motorized w/c manual 07/19/23 Review manual with daughter; Reach out to supplier for questions   Practice outside when family present 07/19/23 Practice going outside in the wheelchair more often when family is present  Familiarize with controls and techniques 07/19/23 Try different or new controls and techniques regularly         Prevention Ideas                  PRACTICE It is important to practice the strategies so we can determine if they will be effective in helping to reach the goal.    Follow these specific recommendations:        If strategy does not work the first time, try it again.     We may make some changes over the next few sessions.      Kristi Batty, MSN, RN, BSN [facility], Healthy Communities RN Case Manager for Aging Gracefully Direct Dial : [phone]      07/19/2023  Assessment: Met with Kristi Campos in her home with daughter Kristi Campos. Kristi Campos reports she has not sat outside. States she has not sat outside because she does not want to sit outside alone. Kristi Campos reports recent Endocrinologist appointment went well. States insulin  increased to 32 units. Kristi Campos confirms she has spoke with Kristi Campos RNCM and appreciates the resources and education provided. Kristi Campos reports she plans to utilize food finder app for food resources. Kristi Campos states she has not had time to follow up with PCP about prescribing medication for mail order pharmacy with Kristi Campos.  Interventions: Demonstrated home exercises. Provided home exercises booklet.  Kristi Campos demonstrated return exercises partially. Encouraged Kristi Campos to perform exercises regularly. Provided DM education booklet. Provided findhelp.com website to Kristi Campos for additional community resources. Encouraged Kristi Campos to request PCP to prescribe medications for home delivery thru Kristi Campos. Encouraged Mrs.  Campos to go outside when family/visitors are present.  Plan: Scheduled next home visit on June 25th at 11am.    Kristi Batty, MSN, RN, BSN [place]  [facility], Healthy Communities RN Case Manager for Aging Gracefully Direct Dial : [phone]                                                          Kristi Batty, MSN, RN, BSN [place]  [facility], Healthy Communities RN Case Manager for Aging Gracefully Direct Dial : [phone]

## 2023-07-23 DIAGNOSIS — G231 Progressive supranuclear ophthalmoplegia [Steele-Richardson-Olszewski]: Secondary | ICD-10-CM | POA: Diagnosis not present

## 2023-07-27 ENCOUNTER — Telehealth: Payer: Self-pay | Admitting: *Deleted

## 2023-07-27 NOTE — Progress Notes (Signed)
 Complex Care Management Note Care Guide Note  07/27/2023 Name: Kristi Campos MRN: [id number] DOB: Sep 18, 1950  Kristi Campos is a 73 y.o. year old female who is a primary care patient of Lawrance Presume, MD . The community resource team was consulted for assistance with Food Insecurity Utilities   SDOH screenings and interventions completed:  Yes     SDOH Interventions Today    Flowsheet Row Most Recent Value  SDOH Interventions   Food Insecurity Interventions Community Resources Provided  [[place] food finder]  Utilities Interventions Community Resources Provided  [Provided community resources]      Patient is aware there  are not a lot of resources available for her needs but has access to food banks and all pertinent connection  Care guide performed the following interventions: Patient provided with information about care guide support team and interviewed to confirm resource needs.  Follow Up Plan:  No further follow up planned at this time. The patient has been provided with needed resources.  Encounter Outcome:  Patient Visit Completed [name]  [facility] HealthPopulation Health Care Guide  Direct Dial [phone] Fax:[phone] Website: [email or web address]

## 2023-08-05 DIAGNOSIS — E119 Type 2 diabetes mellitus without complications: Secondary | ICD-10-CM | POA: Diagnosis not present

## 2023-08-05 DIAGNOSIS — H532 Diplopia: Secondary | ICD-10-CM | POA: Diagnosis not present

## 2023-08-05 DIAGNOSIS — H35033 Hypertensive retinopathy, bilateral: Secondary | ICD-10-CM | POA: Diagnosis not present

## 2023-08-05 DIAGNOSIS — H2513 Age-related nuclear cataract, bilateral: Secondary | ICD-10-CM | POA: Diagnosis not present

## 2023-08-05 DIAGNOSIS — H0288B Meibomian gland dysfunction left eye, upper and lower eyelids: Secondary | ICD-10-CM | POA: Diagnosis not present

## 2023-08-05 DIAGNOSIS — H0288A Meibomian gland dysfunction right eye, upper and lower eyelids: Secondary | ICD-10-CM | POA: Diagnosis not present

## 2023-08-05 LAB — HM DIABETES EYE EXAM

## 2023-08-11 ENCOUNTER — Ambulatory Visit: Payer: Self-pay | Admitting: Internal Medicine

## 2023-08-12 ENCOUNTER — Other Ambulatory Visit: Payer: Self-pay | Admitting: Rehabilitation

## 2023-08-15 ENCOUNTER — Ambulatory Visit: Attending: Internal Medicine | Admitting: Pharmacist

## 2023-08-15 ENCOUNTER — Encounter: Payer: Self-pay | Admitting: Pharmacist

## 2023-08-15 DIAGNOSIS — Z7984 Long term (current) use of oral hypoglycemic drugs: Secondary | ICD-10-CM

## 2023-08-15 DIAGNOSIS — E1169 Type 2 diabetes mellitus with other specified complication: Secondary | ICD-10-CM

## 2023-08-15 DIAGNOSIS — E669 Obesity, unspecified: Secondary | ICD-10-CM

## 2023-08-15 DIAGNOSIS — Z794 Long term (current) use of insulin: Secondary | ICD-10-CM

## 2023-08-15 MED ORDER — LANTUS SOLOSTAR 100 UNIT/ML ~~LOC~~ SOPN
32.0000 [IU] | PEN_INJECTOR | Freq: Every day | SUBCUTANEOUS | 99 refills | Status: DC
Start: 2023-08-15 — End: 2024-01-09

## 2023-08-15 NOTE — Progress Notes (Signed)
 I connected with  Sol Duos on 08/15/23 by a video enabled telemedicine application and verified that I am speaking with the correct person using two identifiers.   I discussed the limitations of evaluation and management by telemedicine. The patient expressed understanding and agreed to proceed.  Location of patient: home   Location of myself: clinic office   Persons participating in the call: myself and the patient   S:     No chief complaint on file.  Kristi Campos is a 73 y.o. female who presents for diabetes evaluation. 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 Candi Chafe).   Last visit with pharmacist on 07/14/2023 where patient reported adherence to insulin  regimen and denied hypoglycemia. A1c at that appointment showed some improvement down to 8.8 (from 9.2 prior). She was instructed to increase Lantus  to 32 U daily and we continued Jardiance  25 mg daily.  Today, she is in good spirits. Denies any changes in symptoms since her last visit. No NV, abdominal pain, or diarrhea. She has some nocturia and visual changes that are at baseline. No changes since I saw her last month. She endorses adherence to her insulin  and Jardiance . Denies any S/sx of hypoglycemia.   Current diabetes medications include: Lantus  32 units once daily, Jardiance  25 mg daily.   Reported home fasting blood sugars: 70s - 150s -Denies any readings > 200.  Reported 2 hour post-meal/random blood sugars: not checking  Patient reports baseline nocturia with no changes.  Patient denies neuropathy (nerve pain). Patient denies any visual changes since last visit. Patient reports self foot exams.   Patient reports adherence to taking all medications as prescribed.   Patient reported dietary habits:  -No changes since her last visit -Eats 2-3 meals  -Patient doesn't eat a lot of carbs -Drinks apple and orange juice.    Patient-reported exercise habits -Patient doesn't have a current exercising regimen.   Family/Social History:  -Fhx: HTN -Tobacco: never smoked  O:  Lab Results  Component Value Date   HGBA1C 8.8 (A) 07/14/2023   There were no vitals filed for this visit.  Lipid Panel     Component Value Date/Time   CHOL 180 11/02/2022 1116   TRIG 138 11/02/2022 1116   HDL 51 11/02/2022 1116   CHOLHDL 3.5 11/02/2022 1116   LDLCALC 105 (H) 11/02/2022 1116   Clinical Atherosclerotic Cardiovascular Disease (ASCVD): No  The 10-year ASCVD risk score (Arnett DK, et al., 2019) is: 26%   Values used to calculate the score:     Age: 56 years     Clincally relevant sex: Female     Is Non-Hispanic African American: Yes     Diabetic: Yes     Tobacco smoker: No     Systolic Blood Pressure: 124 mmHg     Is BP treated: Yes     HDL Cholesterol: 51 mg/dL     Total Cholesterol: 180 mg/dL   A/P: Diabetes longstanding currently uncontrolled. She denies any current symptoms of hypoglycemia. Patient is able to verbalize appropriate hypoglycemia management plan. Home fasting sugars are mostly at goal since increasing her dose of glargine.  Stressed the need for a balance between better glycemic control and avoiding hypoglycemia/increasing her fall risk. Medication adherence appears appropriate. We will have her continue her current regimen. -Continue Lantus  32 units daily. -Continued Jardiance  25 mg daily.  -Patient educated on purpose, proper use, and  potential adverse effects of Lantus .  -Extensively discussed pathophysiology of diabetes, recommended lifestyle interventions, dietary effects on blood sugar control.  -Counseled on s/sx of and management of hypoglycemia.  -Next A1c due 09/2023.  Written patient instructions provided. Patient verbalized understanding of treatment plan.  Total time in face to face counseling 30 minutes.    Follow-up:  -Follow up with me in 1 month via telephone.  Marene Shape, PharmD, Becky Bowels, CPP Clinical Pharmacist [facility] [phone]

## 2023-08-16 NOTE — Patient Outreach (Signed)
 Aging Gracefully Program  OT Follow-Up Visit  08/16/2023  Kristi Campos 1950/04/11 [id number]  Visit:  3- Third Visit  Start Time:  1300 End Time:  1330 Total Minutes:  30  Readiness to Change Score :  Readiness to Change Score: 9.33     Durable Medical Equipment: Adaptive Equipment: Long Handled Sponge, Reacher, Other (Grab bar outisde of bathroom) Adaptive Equipment Distribution Date: 08/12/23  Patient Education: Education Provided: Yes Education Details: Practice use of reacher and LH sponge. Person(s) Educated: Patient Comprehension: Verbalized Understanding, Returned Demonstration  Goals:   Goals Addressed             This Visit's Progress    AG OT patient stated       Safety and mobility in/out of the house 08/12/23: ramp has been installed not yet complete. Asking for the threshold to be lessened to improve her independence with the threshold. Otherwise with zero entry to the house which was independently utilized.     COMPLETED: AG OT Patient Stated   On track    Increase mobility and independence in/out of the car 07/08/23: OT educated and demonstrated use of car assist handle to client. Usually rides with daughter and not able to practice with her car today. Left the item for family to trial. Also spoke with daughter via phone to explain the handle. 08/12/23: client reports that family is able to assist her in/out of car and has transport set up to allow her to use the w/c. Will decline the car assist handle.      AG OT Patient Stated       Safety with reaching for balance and picking up items  (Bathroom grab bar, HH shower wand attachment, reacher/LH sponge) 07/08/23: trial OT demo reacher. Discuss options for LH sponge, OT to order. Also address placement of grab bar in bathroom with demonstration, OT left tape for CHS. Will practice with items next visit 08/12/23: Issued the reacher and LH sponge, practiced use and left for home practice. Bathroom vertical bar  attached to the wall and is able to effectively reach and use. Client reports already using the bar.        Post Clinical Reasoning: Client Action (Goal) One Interventions: trail use of ramp with rollator, assist needed to manage threshold Did Client Try?: Yes Targeted Problem Area Status: A Little Better Client Action (Goal) Two Interventions: Client reports family is able to assist her in/out of the car. Adaptive handle not needed Did Client Try?: Yes Targeted Problem Area Status: The Same Client Action (Goal) Three Interventions: Issue reacher and LH sponge for home use. Practice use of reacher to pick up shoes Did Client Try?: Yes Targeted Problem Area Status: A Little Better Clinician View Of Client Situation:: Client is alert and engaged. Knowledgeable about home repairs. Willing to try walking down the ramp with rollator. Will asak CHS to improve the threshold. Client also reports car assist handle is not needed as her fmaily is able to help her in/out of car without difficulty. Client View Of His/Her Situation:: Client is able to express her needs and is aware of all the work being completed in the house. Able to explain that the car assist handle is not needed and why. Next Visit Plan:: F/U threshold for the ramp, f/u use of assistive devices and complete final visit.

## 2023-08-17 ENCOUNTER — Other Ambulatory Visit: Payer: Self-pay | Admitting: Internal Medicine

## 2023-08-17 ENCOUNTER — Other Ambulatory Visit: Payer: Self-pay

## 2023-08-17 DIAGNOSIS — K219 Gastro-esophageal reflux disease without esophagitis: Secondary | ICD-10-CM

## 2023-08-17 NOTE — Patient Outreach (Signed)
 Complex Care Management   Visit Note  08/17/2023  Name:  Kristi Campos MRN: [id number] DOB: 12-Feb-1951  Situation: Referral received for Complex Care Management related to Diabetes with Complications I obtained verbal consent from Patient.  Visit completed with patient  on the phone  Background:   Past Medical History:  Diagnosis Date   Arthritis    Cataract    Chronic kidney disease    Diabetes mellitus without complication (HCC)    Fibroid uterus    GERD (gastroesophageal reflux disease)    Glaucoma    Hyperlipidemia    Hypertension    Neuromuscular disorder (HCC)    Thyroid  disease    Vaginal Pap smear, abnormal     Assessment: Patient Reported Symptoms:  Cognitive Cognitive Status: Able to follow simple commands, Alert and oriented to person, place, and time      Neurological Neurological Review of Symptoms: No symptoms reported    HEENT HEENT Symptoms Reported: No symptoms reported      Cardiovascular Cardiovascular Symptoms Reported: No symptoms reported    Respiratory Respiratory Symptoms Reported: No symptoms reported    Endocrine Is patient diabetic?: Yes Is patient checking blood sugars at home?: Yes Endocrine Conditions: Diabetes  Gastrointestinal Gastrointestinal Symptoms Reported: No symptoms reported      Genitourinary Genitourinary Symptoms Reported: No symptoms reported    Integumentary Integumentary Symptoms Reported: Night sweats Additional Integumentary Details: The patient states she iws going through menopause    Musculoskeletal Musculoskelatal Symptoms Reviewed: Difficulty walking, Unsteady gait Musculoskeletal Conditions: Unsteady gait, Back pain, Joint pain Musculoskeletal Management Strategies: Medical device Falls in the past year?: Yes Number of falls in past year: 2 or more Was there an injury with Fall?: No Fall Risk Category Calculator: 2 Patient Fall Risk Level: Moderate Fall Risk Patient at Risk for Falls Due to: Other  (Comment) (stood up to fast and got disoriented and fell back) Fall risk Follow up: Falls evaluation completed, Education provided, Falls prevention discussed  Psychosocial       Quality of Family Relationships: supportive Do you feel physically threatened by others?: No      08/17/2023    1:35 PM  Depression screen PHQ 2/9  Decreased Interest 0  Down, Depressed, Hopeless 0  PHQ - 2 Score 0    There were no vitals filed for this visit.  Medications Reviewed Today     Reviewed by Augustin Leber, RN (Registered Nurse) on 08/17/23 at 1320  Med List Status: <None>   Medication Order Taking? Sig Documenting Provider Last Dose Status Informant  ACCU-CHEK GUIDE TEST test strip [id number] Yes USE AS DIRECTED THREE TIMES DAILY Lawrance Presume, MD  Active   AMBULATORY NON [facility] MEDICATION [id number] Yes Please dispense 1 power wheelchair PSP patient DX code G23.1 Tat, Von Grumbling, DO  Active   aspirin  EC 81 MG tablet [id number] Yes Take 81 mg by mouth daily. [provider]  Active Family Member  atorvastatin  (LIPITOR) 10 MG tablet [id number] Yes Take 1 tablet (10 mg total) by mouth daily. Lawrance Presume, MD  Active   Blood Glucose Monitoring Suppl (ACCU-CHEK GUIDE) w/Device KIT [id number] Yes Use to check blood sugar TID. Dx E11.69 Lawrance Presume, MD  Active Family Member  carbidopa -levodopa  (SINEMET  IR) 25-100 MG tablet [id number] Yes Take 1 tablet by mouth 3 (three) times daily. Shirline Dover, DO  Active   diclofenac  Sodium (VOLTAREN ) 1 % GEL [id number] Yes Apply 4 g topically 2 (two) times daily as needed. Newlin, Enobong,  MD  Active   insulin  glargine (LANTUS  SOLOSTAR) 100 UNIT/ML Solostar Pen [id number] Yes Inject 32 Units into the skin daily. Lawrance Presume, MD  Active   Insulin  Pen Needle (B-D ULTRAFINE III SHORT PEN) 31G X 8 MM MISC [id number] Yes USE AS DIRECTED Lawrance Presume, MD  Active   JARDIANCE  25 MG TABS tablet [id number] Yes TAKE 1 TABLET(25 MG) BY MOUTH  DAILY Lawrance Presume, MD  Active   lidocaine  (LIDODERM ) 5 % [id number] Yes Place 1 patch onto the skin daily. Remove & Discard patch within 12 hours or as directed by MD Joaquin Mulberry, MD  Active   losartan -hydrochlorothiazide (HYZAAR) 100-25 MG tablet [id number] Yes TAKE 1/2 TABLET BY MOUTH DAILY Lawrance Presume, MD  Active   nitroGLYCERIN  (NITROSTAT ) 0.4 MG SL tablet [id number] Yes Place 1 tablet (0.4 mg total) under the tongue every 5 (five) minutes as needed for chest pain. Maylene Spear, MD  Active Family Member           Med Note Murrel Arnt, Adeline Hone Apr 27, 2021 10:51 AM) Not taking but has on hand  pantoprazole  (PROTONIX ) 40 MG tablet [id number] Yes TAKE 1 TABLET BY MOUTH DAILY AT Bradly Cage, Rexine Cater, MD  Active     Discontinued 02/28/20 1147 tiZANidine  (ZANAFLEX ) 4 MG capsule [id number] Yes Take 4 mg by mouth 3 (three) times daily. [provider]  Active             Recommendation:   Specialty provider follow-up 7/825 Cataracts  Follow Up Plan:   Telephone follow up appointment date/time:  09/16/23  130 pm  Augustin Leber RN, BSN, [facility]  [facility], [facility]   Care Coordinator Phone: [phone]

## 2023-08-17 NOTE — Patient Instructions (Signed)
 Visit Information  Thank you for taking time to visit with me today. Please don't hesitate to contact me if I can be of assistance to you before our next scheduled appointment.  Your next care management appointment is a Telephone follow up appointment date/time:  09/16/23  130 pm Please call the care guide team at [phone] if you need to cancel, schedule, or reschedule an appointment.   Please call [phone]-TALK (toll free, 24 hour hotline) call 911 if you are experiencing a Mental Health or Behavioral Health Crisis or need someone to talk to.  Augustin Leber RN, BSN, [facility] [place]  [facility], [facility]   Care Coordinator Phone: [phone]

## 2023-08-19 ENCOUNTER — Telehealth: Payer: Self-pay | Admitting: Internal Medicine

## 2023-08-19 ENCOUNTER — Encounter: Payer: Self-pay | Admitting: *Deleted

## 2023-08-19 NOTE — Telephone Encounter (Signed)
 I would need an updated note in chart for insurance to cover speech therapy. Looks like she is over due for f/u with me.  Please schedule.

## 2023-08-19 NOTE — Telephone Encounter (Signed)
 Copied from CRM [id number]. Topic: Referral - Question >> Aug 19, 2023 12:51 PM Emylou G wrote:  Reason for CRM: Daughter called.. wants a referral for speech therapy for her mother

## 2023-08-23 DIAGNOSIS — G231 Progressive supranuclear ophthalmoplegia [Steele-Richardson-Olszewski]: Secondary | ICD-10-CM | POA: Diagnosis not present

## 2023-08-23 NOTE — Telephone Encounter (Signed)
 Patient appointment scheduled to see Dr. Vicci 09/29/2023 at 3:10 pm.

## 2023-08-24 ENCOUNTER — Other Ambulatory Visit: Payer: Self-pay | Admitting: *Deleted

## 2023-08-24 ENCOUNTER — Encounter: Payer: Self-pay | Admitting: *Deleted

## 2023-08-24 NOTE — Patient Outreach (Signed)
 Aging Gracefully Program  RN Visit  08/24/2023  Kristi Campos Mar 16, 1950 [id number]  Visit:  RN Visit Number: 3- Third Visit  RN TIME CALCULATION: Start TIme:  RN Start Time Calculation: 1100 End Time:  RN Stop Time Calculation: 1145 Total Minutes:  RN Time Calculation: 45  Readiness To Change Score:  Readiness to Change Score: 8.67  Universal RN Interventions: Calendar Distribution: No Exercise Review: Yes Medications: Yes Medication Changes: No Mood: Yes Pain: Yes PCP Advocacy/Support: No Fall Prevention: Yes Incontinence: Yes Clinician View Of Client Situation: Arrived for home visit. Mrs Kristi Campos home alone today. Home dark and cool with fans blowing. Mrs. Kristi Campos in motorized wheelchair. Client View Of His/Her Situation: Mrs. Kristi Campos is pleased with ramp being built and bathroom modifications completed by [facility]. Denies any pain or recent falls.  Healthcare Provider Communication: Did Surveyor, mining With [employer] Provider?: No Healthcare Provider Response According to RN: n/a Healthcare Provider Response According To Client: n/a  Clinician View of Client Situation: Clinician View Of Client Situation: Arrived for home visit. Mrs Kristi Campos home alone today. Home dark and cool with fans blowing. Mrs. Kristi Campos in motorized wheelchair. Client's View of His/Her Situation: Client View Of His/Her Situation: Mrs. Kristi Campos is pleased with ramp being built and bathroom modifications completed by [facility]. Denies any pain or recent falls.  Medication Assessment: Reviewed    OT Update: Pending CHS home modifications completion  Session Summary: Mrs. Kristi Campos endorses becoming more familiar and comfortable with her motorized wheelchair. She reports doing well overall. Pleased with her newly installed ramp.    Goals Addressed               This Visit's Progress     AG RN (pt-stated)        06/15/2023  Assessment: Kristi Campos and daughter present during home visit. [name] reports  pain 0 out of 10. Daughter Pamila reports KristiCampos has not had any falls since receiving her motorized wheelchair. States 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 States PSP disorder is a lot like Parkinson's but PSP is more severe. Kristi Campos follows closely with her PCP every 3 months. Also closely follows up with her neurologist. [name] and daughter reports HgbA1C is elevated and will like to get it down. Also follows with Pharm D at PCP office. Daughter reports she will like to see Kristi Campos move about outside and on the ramp more often. [name] reports being nervous that she will get stuck outside when alone. Kristi Campos also report they are interested in medications being delivered. Kristi Campos inquired about another way to obtain adult briefs.   Interventions: Encouraged Ms. Tahir to utilize [employer] benefit to purchase adult briefs online or in [employer]. Advised Kristi Campos to ask her PCP and neurologist to send prescriptions to [facility], after confirming steps to take for medication delivery with insurance. Encouraged [name] to practice driving wheelchair outside when family is there and to review motorized wheelchair manual to become more familiar with controls. Provided chronic disease booklet, calendar, and writer's contact information. Discussed referral for [facility] CCM services to discuss DM management. Kristi Campos agreeable as she is primary contact and HCPOA.   Plan: Scheduled next home visit for May 20th at 11 am. Make [facility] referral for CCM services for DM   CLIENT/RN ACTION PLAN - GENERIC - (smartphrase AGRNGENERIC)  Registered Nurse:  Pablo Hurst Date: 06/15/2023  Client Name: [name] Client ID:    Target Area:  SOFIA  Why Problem May Occur: Nervous about getting stuck outside on the ramp while operating the motorized wheelchair    Target Goal: To become more comfortable, familiar, and to learn how to use the motorized  wheelchair over the next 160 days    STRATEGIES Coping Strategies: Ideas  Review motorized w/c manual 07/19/23 Reviewed 08/24/23 Review manual with daughter; Reach out to supplier for questions   Practice outside when family present 07/19/23 Reviewed 08/24/23 Practice going outside in the wheelchair more often when family is present  Familiarize with controls and techniques 07/19/23 Reviewed 08/24/23 Try different or new controls and techniques regularly         Prevention Ideas                  PRACTICE It is important to practice the strategies so we can determine if they will be effective in helping to reach the goal.    Follow these specific recommendations:        If strategy does not work the first time, try it again.     We may make some changes over the next few sessions.      Pablo Hurst, MSN, RN, BSN [facility], Healthy Communities RN Case Manager for Aging Gracefully Direct Dial : [phone]      07/19/2023  Assessment: Met with Kristi Campos in her home with daughter Pamila. Kristi Campos reports she has not sat outside. States she has not sat outside because she does not want to sit outside alone. Kristi Campos reports recent Endocrinologist appointment went well. States insulin  increased to 32 units. Kristi Campos confirms she has spoke with [facility] RNCM and appreciates the resources and education provided. Kristi Campos reports she plans to utilize food finder app for food resources. Kristi Campos states she has not had time to follow up with PCP about prescribing medication for mail order pharmacy with [facility].  Interventions: Demonstrated home exercises. Provided home exercises booklet.  Mrs. Kristi Campos demonstrated return exercises partially. Encouraged Kristi Campos to perform exercises regularly. Provided DM education booklet. Provided findhelp.com website to [employer] for additional community resources. Encouraged Kristi Campos to request PCP to prescribe medications for home  delivery thru [facility]. Encouraged Kristi Campos to go outside when family/visitors are present.  Plan: Scheduled next home visit on June 25th at 11am.    Pablo Hurst, MSN, RN, BSN [facility], Healthy Communities RN Case Manager for Aging Gracefully Direct Dial : [phone]     08/24/23  Assessment: Mrs. Mol home alone during visit today. Pleased with outdoor ramp completed by [facility]. Reports going outside on the porch/ramp only when daughter is with her. Denies pain or recent falls. Mrs. Buechler states she and her daughter Pamila have been trying to find out if she has adult Medicaid. Mrs. Dolinger asked Clinical research associate to call daughter via phone during visit.   Interventions: Writer performed home visit outside on the porch/ramp to ensure Mrs. Totten has outdoor time. The front of the house has big shady tree which made it cooler and pleasurable for Mrs. Birdsell. Encouraged Mrs. Ryce to perform seated home exercises. Telephone call made to [facility] during visit. Kristi Campos states she is waiting to hear back from DSS on whether Mrs Withem still has adult Medicaid and if not, she wants to reapply. Encouraged Kristi Campos to call back if she does not receive a returned call this week. Scheduled next home visit with Kristi Campos and Mrs. Shean. Encouraged Mrs. Speagle to wear medical alert system at all  times.   Plan: Scheduled next home visit for July 24th at 11 am.

## 2023-08-24 NOTE — Patient Instructions (Signed)
 Visit Information  Thank you for taking time to visit with me today. Please don't hesitate to contact me if I can be of assistance to you before our next scheduled home appointment.  Following are the goals we discussed today:   Goals Addressed               This Visit's Progress     AG RN (pt-stated)        06/15/2023  Assessment: Ms. Gorsline and daughter present during home visit. Ms. Kalmar reports pain 0 out of 10. Daughter Pamila reports Ms.Vandevelde has not had any falls since receiving her motorized wheelchair. 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 States PSP disorder is a lot like Parkinson's but PSP is more severe. Ms. Rivenbark follows closely with her PCP every 3 months. Also closely follows up with her neurologist. Ms. Fromm and daughter reports HgbA1C is elevated and will like to get it down. Also follows with Pharm D at PCP office. Daughter reports she will like to see Ms. Sunga move about outside and on the ramp more often. Ms. Capitano reports being nervous that she will get stuck outside when alone. [name] also report they are interested in medications being delivered. [name] inquired about another way to obtain adult briefs.   Interventions: Encouraged Ms. Barefoot to utilize [employer] benefit to purchase adult briefs online or in [employer]. Advised [name] to ask her PCP and neurologist to send prescriptions to [facility], after confirming steps to take for medication delivery with insurance. Encouraged Ms. Kading to practice driving wheelchair outside when family is there and to review motorized wheelchair manual to become more familiar with controls. Provided chronic disease booklet, calendar, and writer's contact information. Discussed referral for [facility] CCM services to discuss DM management. [name] agreeable as she is primary contact and HCPOA.   Plan: Scheduled next home visit for May 20th at 11 am. Make [facility] referral  for CCM services for DM   CLIENT/RN ACTION PLAN - GENERIC - (smartphrase AGRNGENERIC)  Registered Nurse:  Pablo Hurst Date: 06/15/2023  Client Name: Darvin Vannie Client ID:    Target Area:  SOFIA   Why Problem May Occur: Nervous about getting stuck outside on the ramp while operating the motorized wheelchair    Target Goal: To become more comfortable, familiar, and to learn how to use the motorized wheelchair over the next 160 days    STRATEGIES Coping Strategies: Ideas  Review motorized w/c manual 07/19/23 Reviewed 08/24/23 Review manual with daughter; Reach out to supplier for questions   Practice outside when family present 07/19/23 Reviewed 08/24/23 Practice going outside in the wheelchair more often when family is present  Familiarize with controls and techniques 07/19/23 Reviewed 08/24/23 Try different or new controls and techniques regularly         Prevention Ideas                  PRACTICE It is important to practice the strategies so we can determine if they will be effective in helping to reach the goal.    Follow these specific recommendations:        If strategy does not work the first time, try it again.     We may make some changes over the next few sessions.      Pablo Hurst, MSN, RN, BSN [place]  [facility], Healthy Communities RN Case Manager for Aging Gracefully Direct Dial : [phone]  07/19/2023  Assessment: Met with Mrs. Meschke in her home with daughter Pamila. Mrs. Pettis reports she has not sat outside. States she has not sat outside because she does not want to sit outside alone. [name] reports recent Endocrinologist appointment went well. States insulin  increased to 32 units. [name] confirms she has spoke with [facility] RNCM and appreciates the resources and education provided. [name] reports she plans to utilize food finder app for food resources. [name] states she has not had time to follow up with PCP about  prescribing medication for mail order pharmacy with [facility].  Interventions: Demonstrated home exercises. Provided home exercises booklet.  Mrs. Yusupov demonstrated return exercises partially. Encouraged Mrs. Sciortino to perform exercises regularly. Provided DM education booklet. Provided findhelp.com website to [employer] for additional community resources. Encouraged [name] to request PCP to prescribe medications for home delivery thru [facility]. Encouraged Mrs. Mcneese to go outside when family/visitors are present.  Plan: Scheduled next home visit on June 25th at 11am.    Pablo Hurst, MSN, RN, BSN [facility], Healthy Communities RN Case Manager for Aging Gracefully Direct Dial : [phone]     08/24/23  Assessment: Mrs. Vinal home alone during visit today. Pleased with outdoor ramp completed by CHS. Reports going outside on the porch/ramp only when daughter is with her. Denies pain or recent falls. Mrs. Udovich states she and her daughter Pamila have been trying to find out if she has adult Medicaid. Mrs. Bolla asked Clinical research associate to call daughter via phone during visit.   Interventions: Writer performed home visit outside on the porch/ramp to ensure Mrs. Buehler has outdoor time. The front of the house has big shady tree which made it cooler and pleasurable for Mrs. Pulsifer. Encouraged Mrs. Whitmoyer to perform seated home exercises. Telephone call made to [facility] during visit. [name] states she is waiting to hear back from DSS on whether Mrs Villela still has adult Medicaid and if not, she wants to reapply. Encouraged [name] to call back if she does not receive a returned call this week. Scheduled next home visit with [name] and Mrs. Drummonds. Encouraged Mrs. Grochowski to wear medical alert system at all times.   Plan: Scheduled next home visit for July 24th at 11 am.                                                         Our next appointment  is on July 24 at 11 am  If you are experiencing a Mental Health or Behavioral Health Crisis or need someone to talk to, please call the Suicide and Crisis Lifeline: 988 call the [place]  National Suicide Prevention Lifeline: 573-128-1007 or TTY: 831-686-1911 TTY 781 871 7821) to talk to a trained counselor call 1-800-273-TALK (toll free, 24 hour hotline) go to [facility] [address], [place] [phone]) call 911   The patient verbalized understanding of instructions, educational materials, and care plan provided today and agreed to receive a mailed copy of patient instructions, educational materials, and care plan.   Pablo Hurst, MSN, RN, BSN [place]  [facility], Healthy Communities RN Case Manager for Aging Gracefully Direct Dial : [phone]

## 2023-08-29 IMAGING — MG MM DIGITAL SCREENING BILAT W/ TOMO AND CAD
8 series · 8 of 24 positions shown · non-contrast
Comparison: Previous exam(s).

CLINICAL DATA: Screening.

EXAM:
DIGITAL SCREENING BILATERAL MAMMOGRAM WITH TOMOSYNTHESIS AND CAD
TECHNIQUE: Bilateral screening digital craniocaudal and mediolateral oblique
mammograms were obtained. Bilateral screening digital breast
tomosynthesis was performed. The images were evaluated with
computer-aided detection.

[R MLO synth-2D]
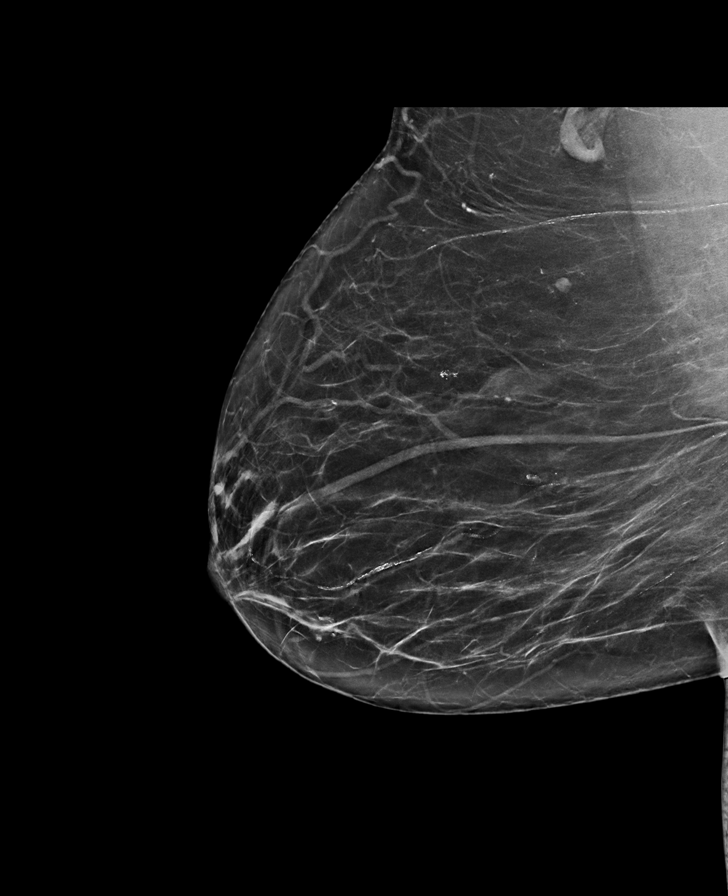

[L CC synth-2D]
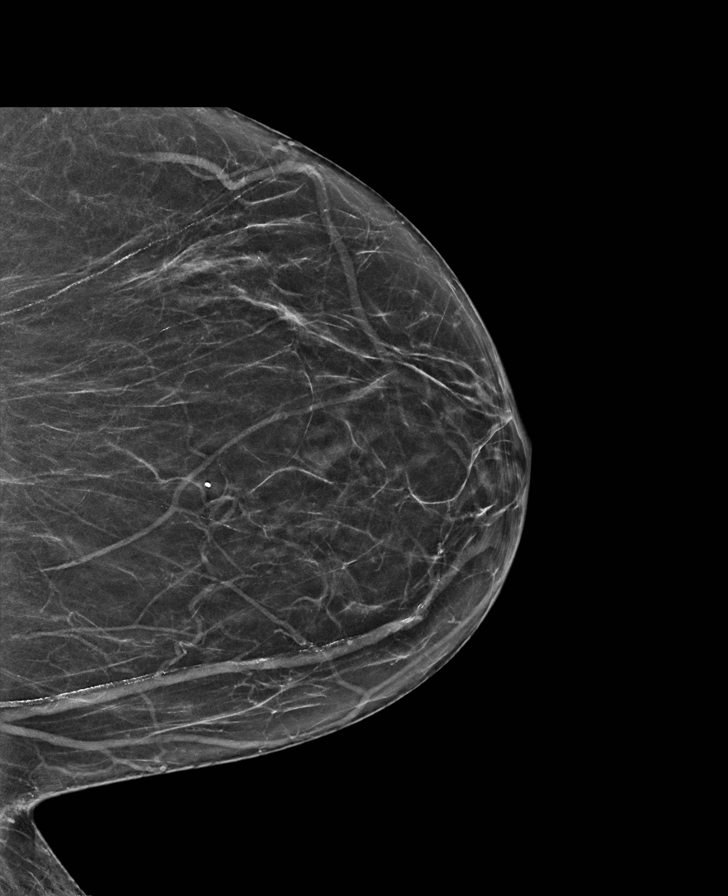

[R CC synth-2D]
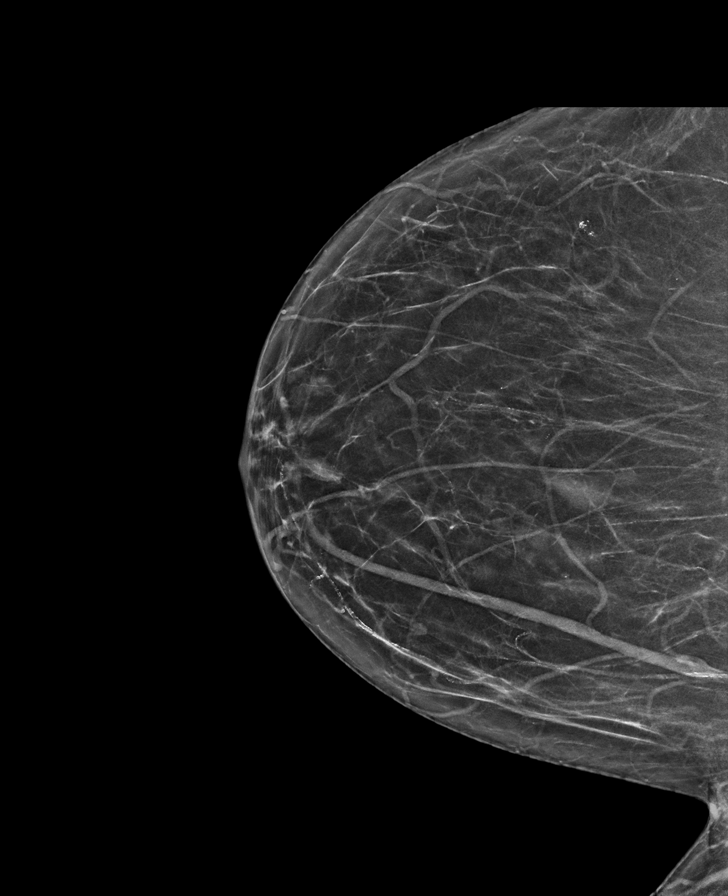

[L MLO synth-2D]
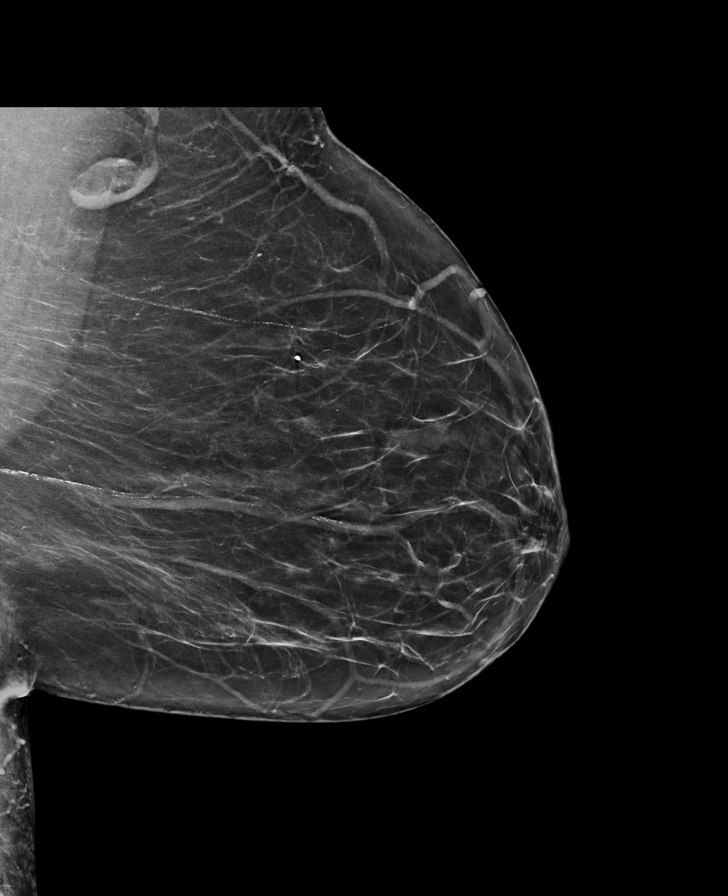

[L MLO tomo · tomo slice 36/71.0]
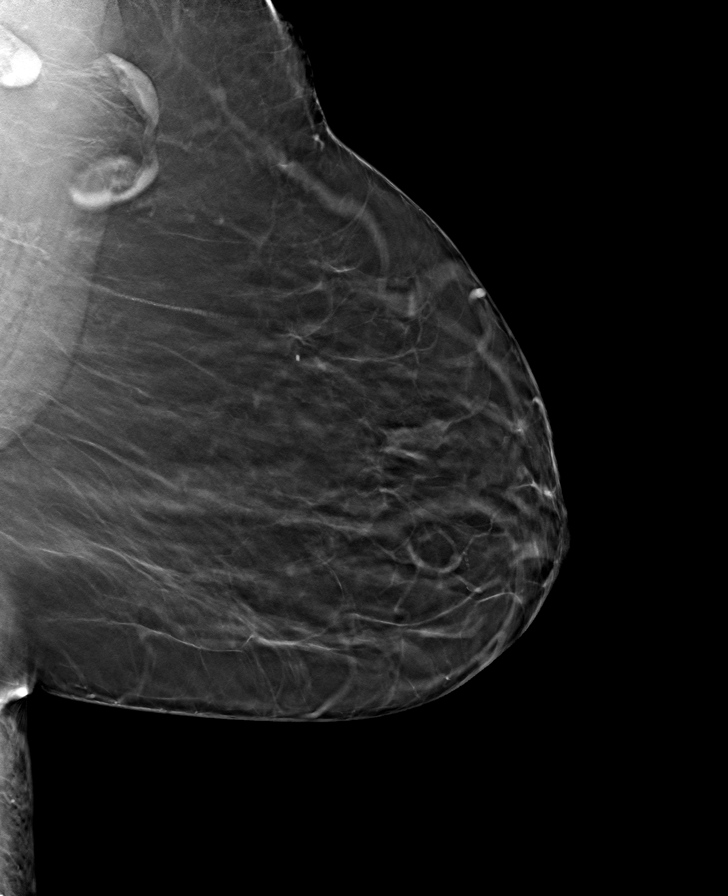

[R CC tomo · tomo slice 29/56.0]
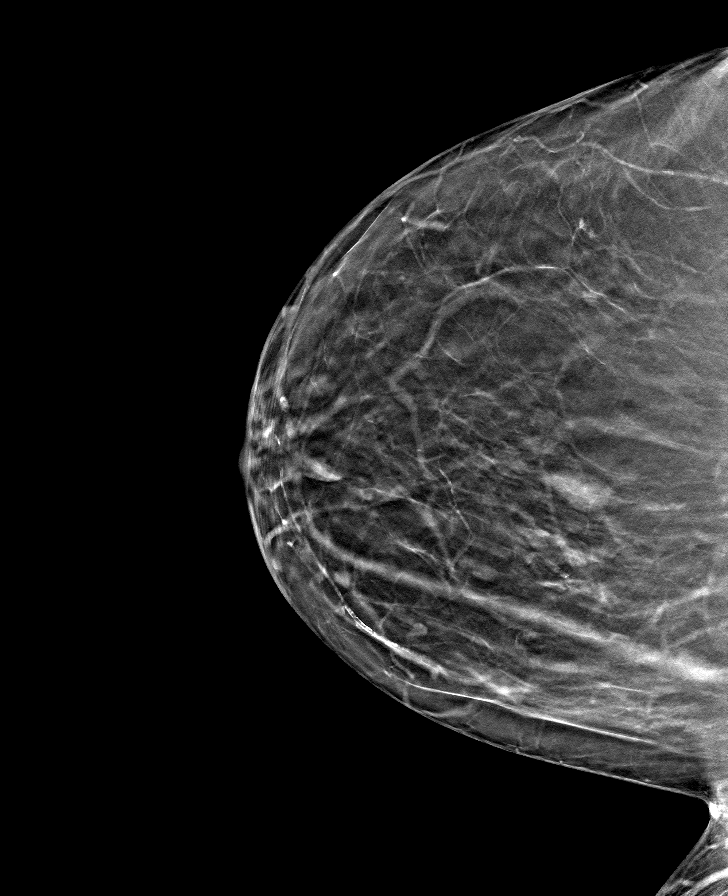

[L CC tomo · tomo slice 29/57.0]
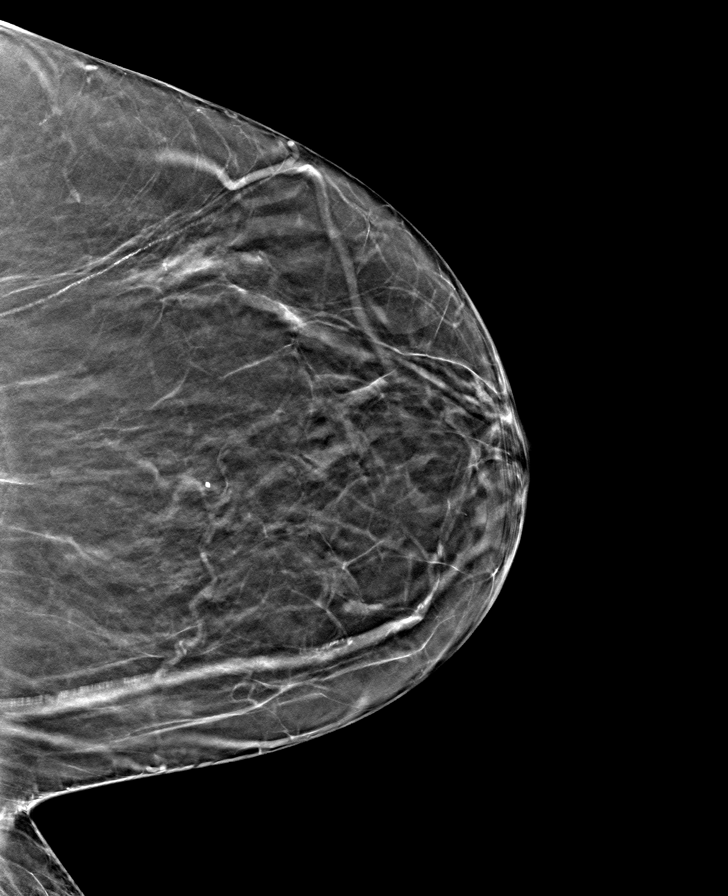

[R MLO tomo · tomo slice 37/72.0]
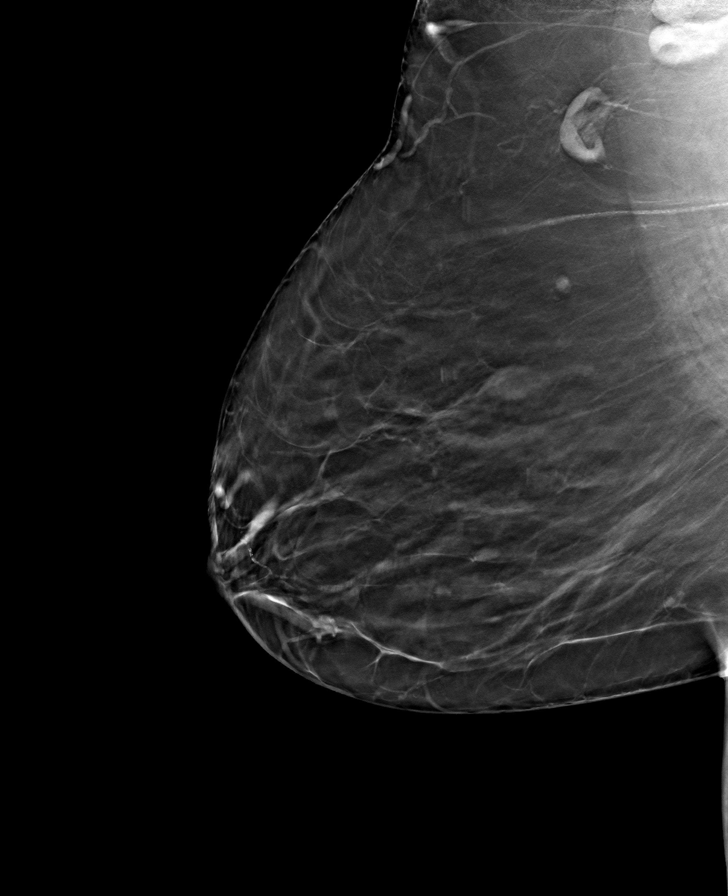

[8 of 24 positions shown; findings below may reference images not displayed]

ACR Breast Density Category b: There are scattered areas of
fibroglandular density.
FINDINGS: There are no findings suspicious for malignancy.
IMPRESSION: No mammographic evidence of malignancy. A result letter of this
screening mammogram will be mailed directly to the patient.

RECOMMENDATION:
Screening mammogram in one year. (Code:51-O-LD2)

BI-RADS CATEGORY  1: Negative.

## 2023-09-06 DIAGNOSIS — H2511 Age-related nuclear cataract, right eye: Secondary | ICD-10-CM | POA: Diagnosis not present

## 2023-09-09 ENCOUNTER — Other Ambulatory Visit: Payer: Self-pay | Admitting: Internal Medicine

## 2023-09-09 ENCOUNTER — Other Ambulatory Visit: Payer: Self-pay | Admitting: Rehabilitation

## 2023-09-09 DIAGNOSIS — E669 Obesity, unspecified: Secondary | ICD-10-CM

## 2023-09-10 NOTE — Patient Outreach (Addendum)
 Aging Gracefully Program  OT Final Visit  09/10/2023  Kristi Campos 04-24-1950 [id number]  Visit:  4- Fourth Visit  Start Time:  1300 End Time:  1320 Total Minutes:  20  Readiness to Change Score :  Readiness to Change Score: 10     Durable Medical Equipment: Durable Medical Equipment: Darcia Finder, Other (electric wheelchair) Adaptive Equipment: Long Handled Sponge, Reacher Adaptive Equipment Distribution Date: 08/12/23  Patient Education: Education Provided: Yes Education Details: Issue TIPS booklet and handout of visit notes. OT to follow up with CHS regarding questions Person(s) Educated: Patient Comprehension: Verbalized Understanding, Returned Demonstration  Goals:   Goals Addressed             This Visit's Progress    COMPLETED: AG OT patient stated       Safety and mobility in/out of the house 08/12/23: ramp has been installed not yet complete. Asking for the threshold to be lessened to improve her independence with the threshold. Otherwise with zero entry to the house which was independently utilized. 09/09/23: has wider doorway into bathroom. Has grab bar to assist transfer out of shower and now has permanent ramp to front door with zero entry and metal threshold at the driveway     COMPLETED: AG OT Patient Stated       Increase mobility and independence in/out of the car 07/08/23: OT educated and demonstrated use of car assist handle to client. Usually rides with daughter and not able to practice with her car today. Left the item for family to trial. Also spoke with daughter via phone to explain the handle. 08/12/23: client reports that family is able to assist her in/out of car and has transport set up to allow her to use the w/c. Will decline the car assist handle.  09/09/23: with new ramp, can better use wheelchair with transportation. And family can assist in and out of the car when using her rollator.     COMPLETED: AG OT Patient Stated       Safety with  reaching for balance and picking up items  (Bathroom grab bar, HH shower wand attachment, reacher/LH sponge) 07/08/23: trial OT demo reacher. Discuss options for LH sponge, OT to order. Also address placement of grab bar in bathroom with demonstration, OT left tape for CHS. Will practice with items next visit 08/12/23: Issued the reacher and LH sponge, practiced use and left for home practice. Bathroom vertical bar attached to the wall and is able to effectively reach and use. Client reports already using the bar.   09/09/23  OT Action Plan: Generic  Target Problem Area:  balance   Why Problem May Occur:     weakness  Bending over  fatigue     Target Goal(s):  1)Improve mobility in/out of house. 2) safety with reaching and balance    STRATEGIES   Saving your Energy DO:  Plan ahead  Use reacher to pick up items from the floor   Use LH sponge   Use grab bars for stability   Keep your Rollator with you        Change your home to make it safe for you    DO:  Continue to not use rugs  Use your electric wheelchair when needed   Energy Conservation                        Post Clinical Reasoning: Client Action (Goal) One Interventions: 09/09/23: clinet able to navigate the ramp  with both her rollator and her electric wheelchair, no concerns with the metal threshold. Did Client Try?: Yes Targeted Problem Area Status: A Lot Better Client Action (Goal) Two Interventions: goal was no longer needed. With the ramp she is able to use her wc with fleeta transportation. Or family is able to help her out of the car Client Action (Goal) Three Interventions: 09/09/23: client reports using the reacher and LF sponge with success Did Client Try?: Yes Targeted Problem Area Status: A Lot Better Clinician View Of Client Situation:: Client is quiet but able to explain what is needed herself or has daughter call. Would like to address her faucette and split board on ramp, OT will  continue to follow up with CHS to solve. Regaridng AG, client is able to use reacher to pick up to reduce opportunity for falling. Wider doorway allow her to use her rollator into the bathroom if needed. Grab bar helps with stepping out of the shower. Client View Of His/Her Situation:: Client has a hard time talking but responds that she does not have further concerns and that she is able to use the new modifications Next Visit Plan:: Final visit completed today

## 2023-09-15 ENCOUNTER — Encounter: Payer: Self-pay | Admitting: Pharmacist

## 2023-09-15 ENCOUNTER — Ambulatory Visit: Attending: Internal Medicine | Admitting: Pharmacist

## 2023-09-15 DIAGNOSIS — E669 Obesity, unspecified: Secondary | ICD-10-CM

## 2023-09-15 DIAGNOSIS — Z7984 Long term (current) use of oral hypoglycemic drugs: Secondary | ICD-10-CM

## 2023-09-15 DIAGNOSIS — Z794 Long term (current) use of insulin: Secondary | ICD-10-CM

## 2023-09-15 DIAGNOSIS — E1169 Type 2 diabetes mellitus with other specified complication: Secondary | ICD-10-CM

## 2023-09-15 NOTE — Progress Notes (Signed)
 I connected with  Kristi Campos on 09/15/23 by a video enabled telemedicine application and verified that I am speaking with the correct person using two identifiers.   I discussed the limitations of evaluation and management by telemedicine. The patient expressed understanding and agreed to proceed.  Location of patient: home   Location of myself: clinic office   Persons participating in the call: myself and the patient  S:     No chief complaint on file.  Kristi Campos is a 73 y.o. female who presents for diabetes evaluation. 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   Last visit with pharmacist on 08/15/2023 where patient reported adherence to insulin  regimen and denied hypoglycemia. A1c 07/14/23 was 8.8 (down from 9.2 prior). Patient was instructed to continue Lantus  and Jardiance  at her current doses.  Today, she is in good spirits. Denies any changes in symptoms since her last visit. No NV, abdominal pain, or diarrhea. She has some nocturia and visual changes that are at baseline. No changes since I saw her last month. She endorses adherence to her insulin  and Jardiance . Denies any S/sx of hypoglycemia.   Current diabetes medications include: Lantus  32 units once daily, Jardiance  25 mg daily.   Reported home fasting blood sugars: 70s - 150s -Denies any readings > 200.  Reported 2 hour post-meal/random blood sugars: not checking  Patient reports baseline nocturia with no changes.  Patient denies neuropathy (nerve pain). Patient denies any visual changes since last visit. Patient reports self foot exams.   Patient reports adherence to taking all medications as prescribed.   Patient reported dietary habits:  -No changes since her last visit -Eats 2-3 meals  -Patient doesn't eat a lot of carbs -Drinks apple and orange juice.   Patient-reported exercise habits -Patient doesn't have a  current exercising regimen.   Family/Social History:  -Fhx: HTN -Tobacco: never smoked  O:  Lab Results  Component Value Date   HGBA1C 8.8 (A) 07/14/2023   There were no vitals filed for this visit.  Lipid Panel     Component Value Date/Time   CHOL 180 11/02/2022 1116   TRIG 138 11/02/2022 1116   HDL 51 11/02/2022 1116   CHOLHDL 3.5 11/02/2022 1116   LDLCALC 105 (H) 11/02/2022 1116   Clinical Atherosclerotic Cardiovascular Disease (ASCVD): No  The 10-year ASCVD risk score (Arnett DK, et al., 2019) is: 26%   Values used to calculate the score:     Age: 63 years     Clincally relevant sex: Female     Is Non-Hispanic African American: Yes     Diabetic: Yes     Tobacco smoker: No     Systolic Blood Pressure: 124 mmHg     Is BP treated: Yes     HDL Cholesterol: 51 mg/dL     Total Cholesterol: 180 mg/dL   A/P: Diabetes longstanding currently uncontrolled. She denies any current symptoms of hypoglycemia. Patient is able to verbalize appropriate hypoglycemia management plan. Home fasting sugars are mostly at goal since increasing her dose of glargine.  Stressed the need for a balance between better glycemic control and avoiding hypoglycemia/increasing her fall risk. Medication adherence appears appropriate. We will have her continue her current regimen. -Continue Lantus  32 units daily. -Continued Jardiance  25 mg daily.  -Patient educated on purpose, proper use, and potential adverse effects of Lantus .  -Extensively discussed pathophysiology of diabetes,  recommended lifestyle interventions, dietary effects on blood sugar control.  -Counseled on s/sx of and management of hypoglycemia.  -Next A1c due 09/2023.  Written patient instructions provided. Patient verbalized understanding of treatment plan.  Total time in face to face counseling 30 minutes.    Follow-up:  -Follow up with me in Aug for updated A1c.  Kristi Campos, PharmD, JAQUELINE, CPP Clinical Pharmacist [facility] [phone]

## 2023-09-16 ENCOUNTER — Telehealth

## 2023-09-17 NOTE — Patient Instructions (Signed)
 [place] - I am sorry I was unable to reach you today for our scheduled appointment. I work with Vicci Barnie NOVAK, MD and am calling to support your healthcare needs. Please contact me at [phone] at your earliest convenience. I look forward to speaking with you soon.   Thank you,  Wilbert Diver RN, BSN, [facility] [place]  [facility], [facility]    Care Coordinator Phone: [phone]

## 2023-09-21 ENCOUNTER — Other Ambulatory Visit: Payer: Self-pay | Admitting: *Deleted

## 2023-09-21 NOTE — Patient Outreach (Signed)
 Aging Gracefully Program  09/21/2023  Kristi Campos 1950-07-16 [id number]   Telephone call made to [name] (daughter/DPR) to confirm AG RN home visit on tomorrow at 11 am. Pamila reports Ms. Hartfield is now off the waiting list for Meals on Wheels after one year. States someone from [employer] is coming out to the home to do the paperwork on Thursday at 10 am. Discussed it will be better for Clinical research associate to reschedule AG RN home visit.  Plan: Scheduled next home visit for 09/27/23 at 10 am.   Pablo Hurst, MSN, RN, BSN [place]  [facility], Healthy Communities RN Case Manager for Aging Gracefully Direct Dial : [phone]

## 2023-09-22 ENCOUNTER — Encounter: Payer: Self-pay | Admitting: *Deleted

## 2023-09-22 DIAGNOSIS — G231 Progressive supranuclear ophthalmoplegia [Steele-Richardson-Olszewski]: Secondary | ICD-10-CM | POA: Diagnosis not present

## 2023-09-27 ENCOUNTER — Encounter: Payer: Self-pay | Admitting: *Deleted

## 2023-09-27 ENCOUNTER — Other Ambulatory Visit: Payer: Self-pay | Admitting: *Deleted

## 2023-09-27 NOTE — Patient Outreach (Signed)
 Aging Gracefully Program  RN Visit  09/27/2023  Kristi Campos 1950-04-18 [id number]  Visit:  RN Visit Number: 4- Fourth Visit  RN TIME CALCULATION: Start TIme:  RN Start Time Calculation: 1000 End Time:  RN Stop Time Calculation: 1030 Total Minutes:  RN Time Calculation: 30  Readiness To Change Score:  Readiness to Change Score: 9.33  Universal RN Interventions: Calendar Distribution: No Exercise Review: Yes Medications: Yes Medication Changes: No Mood: Yes Pain: Yes PCP Advocacy/Support: No Fall Prevention: Yes Incontinence: Yes Clinician View Of Client Situation: Arrived for home visit. Kristi Campos was waiting for writer's arrival in her motorized wheelchair to sit outside on porch. Client View Of His/Her Situation: Kristi Campos reports she has been doing well. Denies pain of falls. Pleased with CHS work completion.  Healthcare Provider Communication: Did Surveyor, mining With [employer] Provider?: No Healthcare Provider Response According to RN: n/a According to Client, Did PCP Report Communication With An Aging Gracefully RN?: No Healthcare Provider Response According To Client: n/a  Clinician View of Client Situation: Clinician View Of Client Situation: Arrived for home visit. Kristi Campos was waiting for writer's arrival in her motorized wheelchair to sit outside on porch. Client's View of His/Her Situation: Client View Of His/Her Situation: Kristi Campos reports she has been doing well. Denies pain of falls. Pleased with CHS work completion.  Medication Assessment: Reviewed.    OT Update: Completion of CHS home modifications.  Session Summary: Kristi Campos is doing well overall. She and her daughter are pleased with Aging Gracefully program and CHS home modifications.   Goals Addressed               This Visit's Progress     COMPLETED: AG RN (pt-stated)        06/15/2023  Assessment: Kristi Campos and daughter present during home visit. Kristi Campos reports  pain 0 out of 10. Daughter Kristi Campos reports KristiCampos has not had any falls since receiving her motorized wheelchair. 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 States PSP disorder is a lot like Parkinson's but PSP is more severe. Kristi Campos follows closely with her PCP every 3 months. Also closely follows up with her neurologist. Kristi Campos and daughter reports HgbA1C is elevated and will like to get it down. Also follows with Pharm D at PCP office. Daughter reports she will like to see Kristi Campos move about outside and on the ramp more often. Kristi Campos reports being nervous that she will get stuck outside when alone. Kristi Campos also report they are interested in medications being delivered. Kristi Campos inquired about another way to obtain adult briefs.   Interventions: Encouraged Kristi Campos to utilize [employer] benefit to purchase adult briefs online or in [employer]. Advised Kristi Campos to ask her PCP and neurologist to send prescriptions to [facility], after confirming steps to take for medication delivery with insurance. Encouraged Kristi Campos to practice driving wheelchair outside when family is there and to review motorized wheelchair manual to become more familiar with controls. Provided chronic disease booklet, calendar, and writer's contact information. Discussed referral for [facility] CCM services to discuss DM management. Kristi Campos agreeable as she is primary contact and HCPOA.   Plan: Scheduled next home visit for May 20th at 11 am. Make [facility] referral for CCM services for DM   CLIENT/RN ACTION PLAN - GENERIC - (smartphrase AGRNGENERIC)  Registered Nurse:  Kristi Campos Date: 06/15/2023  Client Name: Kristi Campos Client ID:    Target Area:  GENERIC   Why Problem May Occur: Nervous about getting stuck outside on the ramp while operating the motorized wheelchair    Target Goal: To become more comfortable, familiar, and to learn how to use the motorized  wheelchair over the next 160 days    STRATEGIES Coping Strategies: Ideas  Review motorized w/c manual 07/19/23 Reviewed 08/24/23 Reviewed 09/27/23 Review manual with daughter; Reach out to supplier for questions   Practice outside when family present 07/19/23 Reviewed 08/24/23 Reviewed 09/27/23 Practice going outside in the wheelchair more often when family is present  Familiarize with controls and techniques 07/19/23 Reviewed 08/24/23 Reviewed 09/27/23 Try different or new controls and techniques regularly         Prevention Ideas                  PRACTICE It is important to practice the strategies so we can determine if they will be effective in helping to reach the goal.    Follow these specific recommendations:        If strategy does not work the first time, try it again.     We may make some changes over the next few sessions.      Kristi Hurst, MSN, RN, BSN [facility], Healthy Communities RN Case Manager for Aging Gracefully Direct Dial : [phone]      07/19/2023  Assessment: Met with [name] in her home with daughter Kristi Campos. [name] reports she has not sat outside. States she has not sat outside because she does not want to sit outside alone. Kristi Campos reports recent Endocrinologist appointment went well. States insulin  increased to 32 units. Kristi Campos confirms she has spoke with [facility] RNCM and appreciates the resources and education provided. Kristi Campos reports she plans to utilize food Campos app for food resources. Kristi Campos states she has not had time to follow up with PCP about prescribing medication for mail order pharmacy with [facility].  Interventions: Demonstrated home exercises. Provided home exercises booklet.  Mrs. Angell demonstrated return exercises partially. Encouraged Mrs. Wilmott to perform exercises regularly. Provided DM education booklet. Provided findhelp.com website to [employer] for additional community resources. Encouraged  Kristi Campos to request PCP to prescribe medications for home delivery thru [facility]. Encouraged Mrs. Mcgonagle to go outside when family/visitors are present.  Plan: Scheduled next home visit on June 25th at 11am.    Kristi Hurst, MSN, RN, BSN [facility], Healthy Communities RN Case Manager for Aging Gracefully Direct Dial : [phone]     08/24/23  Assessment: Mrs. Olesen home alone during visit today. Pleased with outdoor ramp completed by CHS. Reports going outside on the porch/ramp only when daughter is with her. Denies pain or recent falls. Mrs. Rickenbach states she and her daughter Kristi Campos have been trying to find out if she has adult Medicaid. Mrs. Nall asked Clinical research associate to call daughter via phone during visit.   Interventions: Writer performed home visit outside on the porch/ramp to ensure Mrs. Bosque has outdoor time. The front of the house has big shady tree which made it cooler and pleasurable for Mrs. Heikkila. Encouraged Mrs. Soltero to perform seated home exercises. Telephone call made to [facility] during visit. Kristi Campos states she is waiting to hear back from DSS on whether Mrs Clouatre still has adult Medicaid and if not, she wants to reapply. Encouraged Kristi Campos to call back if she does not receive a returned call this week. Scheduled next home visit with Kristi Campos and Mrs. Mcneely. Encouraged  Mrs. Mostafa to wear medical alert system at all times.   Plan: Scheduled next home visit for July 24th at 11 am.     09/27/23  Assessment: Mrs. Gilkey pleased with Aging Gracefully program. Reports she has been going outside in her motorized wheelchair when she has visitors. Reports she is comfortable with her motorized wheelchair. Denies having falls and pain. Rates pain 0 out of 10.  Mrs. Oconnor [employer] on Wheels will begin in 4 weeks.  Interventions: Discussed fall precautions. Met with Mrs. Haliburton on the front porch ramp per her request. Called daughter Kristi Campos on speaker phone.  Encouraged Kristi Campos to ask PCP office again to send prescriptions for [facility] for prescription home delivery. Discussed with Mrs. States and daughter this is writer's last home visit. Provided non-perishable bag of food as courtesy of [employer] [facility] [employer] [employer].  Plan: Will notify Aging Gracefully team of visit of writer's last home visit today.

## 2023-09-27 NOTE — Patient Instructions (Signed)
 Visit Information  Thank you for taking time to visit with me today. Please don't hesitate to contact me if I can be of assistance to you before our next scheduled home appointment.  Following are the goals we discussed today:    Goals Addressed               This Visit's Progress     COMPLETED: AG RN (pt-stated)        06/15/2023  Assessment: Ms. Buresh and daughter present during home visit. Ms. Spear reports pain 0 out of 10. Daughter Pamila reports Ms.Alderman has not had any falls since receiving her motorized wheelchair. States Ms. 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 States PSP disorder is a lot like Parkinson's but PSP is more severe. Ms. Nicol follows closely with her PCP every 3 months. Also closely follows up with her neurologist. Ms. Olberding and daughter reports HgbA1C is elevated and will like to get it down. Also follows with Pharm D at PCP office. Daughter reports she will like to see Ms. Tolen move about outside and on the ramp more often. Ms. Manso reports being nervous that she will get stuck outside when alone. [name] also report they are interested in medications being delivered. [name] inquired about another way to obtain adult briefs.   Interventions: Encouraged Ms. Murrillo to utilize [employer] benefit to purchase adult briefs online or in [employer]. Advised [name] to ask her PCP and neurologist to send prescriptions to [facility], after confirming steps to take for medication delivery with insurance. Encouraged Ms. Schwartzman to practice driving wheelchair outside when family is there and to review motorized wheelchair manual to become more familiar with controls. Provided chronic disease booklet, calendar, and writer's contact information. Discussed referral for [facility] CCM services to discuss DM management. [name] agreeable as she is primary contact and HCPOA.   Plan: Scheduled next home visit for May 20th at 11 am. Make  [facility] referral for CCM services for DM   CLIENT/RN ACTION PLAN - GENERIC - (smartphrase AGRNGENERIC)  Registered Nurse:  Pablo Hurst Date: 06/15/2023  Client Name: Darvin Vannie Client ID:    Target Area:  SOFIA   Why Problem May Occur: Nervous about getting stuck outside on the ramp while operating the motorized wheelchair    Target Goal: To become more comfortable, familiar, and to learn how to use the motorized wheelchair over the next 160 days    STRATEGIES Coping Strategies: Ideas  Review motorized w/c manual 07/19/23 Reviewed 08/24/23 Reviewed 09/27/23 Review manual with daughter; Reach out to supplier for questions   Practice outside when family present 07/19/23 Reviewed 08/24/23 Reviewed 09/27/23 Practice going outside in the wheelchair more often when family is present  Familiarize with controls and techniques 07/19/23 Reviewed 08/24/23 Reviewed 09/27/23 Try different or new controls and techniques regularly         Prevention Ideas                  PRACTICE It is important to practice the strategies so we can determine if they will be effective in helping to reach the goal.    Follow these specific recommendations:        If strategy does not work the first time, try it again.     We may make some changes over the next few sessions.      Pablo Hurst, MSN, RN, BSN [place]  [facility], Healthy Communities RN Case Manager for  Aging Gracefully Direct Dial : [phone]      07/19/2023  Assessment: Met with Mrs. Mendel in her home with daughter Pamila. Mrs. Rupert reports she has not sat outside. States she has not sat outside because she does not want to sit outside alone. [name] reports recent Endocrinologist appointment went well. States insulin  increased to 32 units. [name] confirms she has spoke with [facility] RNCM and appreciates the resources and education provided. [name] reports she plans to utilize food finder app for food resources.  [name] states she has not had time to follow up with PCP about prescribing medication for mail order pharmacy with [facility].  Interventions: Demonstrated home exercises. Provided home exercises booklet.  Mrs. Renaud demonstrated return exercises partially. Encouraged Mrs. Konitzer to perform exercises regularly. Provided DM education booklet. Provided findhelp.com website to [employer] for additional community resources. Encouraged [name] to request PCP to prescribe medications for home delivery thru [facility]. Encouraged Mrs. Gemmer to go outside when family/visitors are present.  Plan: Scheduled next home visit on June 25th at 11am.    Pablo Hurst, MSN, RN, BSN [facility], Healthy Communities RN Case Manager for Aging Gracefully Direct Dial : [phone]     08/24/23  Assessment: Mrs. Trew home alone during visit today. Pleased with outdoor ramp completed by CHS. Reports going outside on the porch/ramp only when daughter is with her. Denies pain or recent falls. Mrs. Pearce states she and her daughter Pamila have been trying to find out if she has adult Medicaid. Mrs. Mascio asked Clinical research associate to call daughter via phone during visit.   Interventions: Writer performed home visit outside on the porch/ramp to ensure Mrs. Hedgepeth has outdoor time. The front of the house has big shady tree which made it cooler and pleasurable for Mrs. Pope. Encouraged Mrs. Alesi to perform seated home exercises. Telephone call made to [facility] during visit. [name] states she is waiting to hear back from DSS on whether Mrs Splinter still has adult Medicaid and if not, she wants to reapply. Encouraged [name] to call back if she does not receive a returned call this week. Scheduled next home visit with [name] and Mrs. Kaufmann. Encouraged Mrs. Thang to wear medical alert system at all times.   Plan: Scheduled next home visit for July 24th at 11 am.     09/27/23  Assessment: Mrs. Pellum pleased with  Aging Gracefully program. Reports she has been going outside in her motorized wheelchair when she has visitors. Reports she is comfortable with her motorized wheelchair. Denies having falls and pain. Rates pain 0 out of 10.  Mrs. Schrade [employer] on Wheels will begin in 4 weeks.  Interventions: Discussed fall precautions. Met with Mrs. Stegall on the front porch ramp per her request. Called daughter Pamila on speaker phone. Encouraged [name] to ask PCP office again to send prescriptions for [facility] for prescription home delivery. Discussed with Mrs. Mathia and daughter this is writer's last home visit. Provided non-perishable bag of food as courtesy of [employer].  Plan: Will notify Aging Gracefully team of visit of writer's last home visit today.                                                      If you are experiencing a Mental Health or Behavioral Health  Crisis or need someone to talk to, please call the Suicide and Crisis Lifeline: 988 call the [place]  National Suicide Prevention Lifeline: (214)549-0382 or TTY: 272-864-2082 TTY (636)561-7204) to talk to a trained counselor call 1-800-273-TALK (toll free, 24 hour hotline) go to [facility] [address] [place] [phone]) call 911   The patient verbalized understanding of instructions, educational materials, and care plan provided today and agreed to receive a mailed copy of patient instructions, educational materials, and care plan.   Pablo Hurst, MSN, RN, BSN [place]  [facility], Healthy Communities RN Case Manager for Aging Gracefully Direct Dial : [phone]

## 2023-09-29 ENCOUNTER — Ambulatory Visit: Admitting: Internal Medicine

## 2023-10-01 ENCOUNTER — Other Ambulatory Visit: Payer: Self-pay

## 2023-10-01 ENCOUNTER — Encounter (HOSPITAL_COMMUNITY): Payer: Self-pay

## 2023-10-01 ENCOUNTER — Emergency Department (HOSPITAL_COMMUNITY)
Admission: EM | Admit: 2023-10-01 | Discharge: 2023-10-01 | Disposition: A | Discharge status: Home / Self Care | Attending: Emergency Medicine | Admitting: Emergency Medicine

## 2023-10-01 ENCOUNTER — Emergency Department (HOSPITAL_COMMUNITY)

## 2023-10-01 DIAGNOSIS — Z743 Need for continuous supervision: Secondary | ICD-10-CM | POA: Diagnosis not present

## 2023-10-01 DIAGNOSIS — S92414A Nondisplaced fracture of proximal phalanx of right great toe, initial encounter for closed fracture: Secondary | ICD-10-CM | POA: Insufficient documentation

## 2023-10-01 DIAGNOSIS — S90111A Contusion of right great toe without damage to nail, initial encounter: Secondary | ICD-10-CM | POA: Diagnosis not present

## 2023-10-01 DIAGNOSIS — Z794 Long term (current) use of insulin: Secondary | ICD-10-CM | POA: Insufficient documentation

## 2023-10-01 DIAGNOSIS — M19071 Primary osteoarthritis, right ankle and foot: Secondary | ICD-10-CM | POA: Diagnosis not present

## 2023-10-01 DIAGNOSIS — M79674 Pain in right toe(s): Secondary | ICD-10-CM | POA: Diagnosis not present

## 2023-10-01 DIAGNOSIS — Z7982 Long term (current) use of aspirin: Secondary | ICD-10-CM | POA: Insufficient documentation

## 2023-10-01 NOTE — ED Triage Notes (Signed)
 Pt BIB EMS from home for right big toe pain. Pt ran into the wall with her power wheelchair and hit her foot. There are no deformities per EMS and there's only pain on palpitation.  No blood thinners

## 2023-10-01 NOTE — Discharge Instructions (Signed)
 As discussed, with your broken toe it is important to follow-up with your foot doctor on Monday via telephone.  Return here for concerning changes in your condition.

## 2023-10-01 NOTE — ED Provider Notes (Signed)
 [place] EMERGENCY DEPARTMENT AT [facility] Provider Note   CSN: [id number] Arrival date & time: 10/01/23  8044     Patient presents with: Foot Injury   Kristi Campos is a 73 y.o. female.   HPI Presents with her daughter who assists with history.  Patient was using her motorized wheelchair when she ran her foot against a wall.  Since that time she has had pain in the right great toe.  She continues to be able to move the toe, extend, flex, has no other injuries, no other complaints.    Prior to Admission medications   Medication Sig Start Date End Date Taking? Authorizing Provider  ACCU-CHEK GUIDE TEST test strip USE AS DIRECTED THREE TIMES DAILY 09/12/23   Vicci Barnie NOVAK, MD  AMBULATORY NON FORMULARY MEDICATION Please dispense 1 power wheelchair PSP patient DX code G23.1 06/18/22   Tat, Asberry RAMAN, DO  aspirin  EC 81 MG tablet Take 81 mg by mouth daily.    [provider]  atorvastatin  (LIPITOR) 10 MG tablet Take 1 tablet (10 mg total) by mouth daily. 11/02/22   Vicci Barnie NOVAK, MD  Blood Glucose Monitoring Suppl (ACCU-CHEK GUIDE) w/Device KIT Use to check blood sugar TID. Dx E11.69 10/30/21   Vicci Barnie NOVAK, MD  carbidopa -levodopa  (SINEMET  IR) 25-100 MG tablet Take 1 tablet by mouth 3 (three) times daily. 06/16/23   Tat, Asberry RAMAN, DO  diclofenac  Sodium (VOLTAREN ) 1 % GEL Apply 4 g topically 2 (two) times daily as needed. 10/04/22   Newlin, Enobong, MD  insulin  glargine (LANTUS  SOLOSTAR) 100 UNIT/ML Solostar Pen Inject 32 Units into the skin daily. 08/15/23   Vicci Barnie NOVAK, MD  Insulin  Pen Needle (B-D ULTRAFINE III SHORT PEN) 31G X 8 MM MISC USE AS DIRECTED 11/03/22   Vicci Barnie NOVAK, MD  JARDIANCE  25 MG TABS tablet TAKE 1 TABLET(25 MG) BY MOUTH DAILY 05/31/23   Vicci Barnie NOVAK, MD  lidocaine  (LIDODERM ) 5 % Place 1 patch onto the skin daily. Remove & Discard patch within 12 hours or as directed by MD 10/04/22   Delbert Clam, MD   losartan -hydrochlorothiazide (HYZAAR) 100-25 MG tablet TAKE 1/2 TABLET BY MOUTH DAILY 04/27/23   Vicci Barnie NOVAK, MD  nitroGLYCERIN  (NITROSTAT ) 0.4 MG SL tablet Place 1 tablet (0.4 mg total) under the tongue every 5 (five) minutes as needed for chest pain. 10/18/20   Krishnan, Gokul, MD  pantoprazole  (PROTONIX ) 40 MG tablet TAKE 1 TABLET BY MOUTH DAILY AT NOON 08/17/23   Vicci Barnie NOVAK, MD  tiZANidine  (ZANAFLEX ) 4 MG capsule Take 4 mg by mouth 3 (three) times daily.    [provider]  simvastatin (ZOCOR) 10 MG tablet Take 10 mg by mouth at bedtime. 01/01/19 02/28/20  [provider]    Allergies: Metformin  and related    Review of Systems  Updated Vital Signs BP (!) 151/82 (BP Location: Right Arm)   Pulse 86   Temp 98.9 F (37.2 C) (Oral)   Resp 18   SpO2 95%   Physical Exam Vitals and nursing note reviewed.  Constitutional:      General: She is not in acute distress.    Appearance: She is well-developed.  HENT:     Head: Normocephalic and atraumatic.  Eyes:     Conjunctiva/sclera: Conjunctivae normal.  Cardiovascular:     Rate and Rhythm: Normal rate and regular rhythm.     Pulses: Normal pulses.  Pulmonary:     Effort: Pulmonary effort is normal.  No respiratory distress.     Breath sounds: No stridor.  Abdominal:     General: There is no distension.  Musculoskeletal:     Comments: Right ankle flexion extension unremarkable, patient flexes and extends all toes as well though she has limited range of motion with the great toe secondary to pain.  There is tenderness palpation on the proximal phalanx.  No skin breakdown.  Skin:    General: Skin is warm and dry.  Neurological:     Mental Status: She is alert and oriented to person, place, and time.     Cranial Nerves: No cranial nerve deficit.  Psychiatric:        Mood and Affect: Mood normal.     (all labs ordered are listed, but only abnormal results are displayed) Labs Reviewed - No data to  display  EKG: None  Radiology: DG Toe Great Right Result Date: 10/01/2023 CLINICAL DATA:  Right great toe injury, pain EXAM: RIGHT GREAT TOE COMPARISON:  None Available. FINDINGS: Frontal, oblique, lateral views of the right great toe are obtained. There is a minimally displaced intra-articular fracture through the lateral aspect of the base of the first distal phalanx. Diffuse soft tissue swelling. 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 IMPRESSION: 1. Minimally displaced intra-articular fracture at the lateral margin of the base of the first distal phalanx. 2. Moderate osteoarthritis of the first MTP joint. Electronically Signed   By: Ozell Daring M.D.   On: 10/01/2023 20:26     Procedures   Medications Ordered in the ED - No data to display                                  Medical Decision Making Adult female presents after sustaining a toe injury.  At bedside I demonstrated the x-ray, including identifying fracture, consistent with patient's physical exam.  She has no other injuries, no other complaints, is distal neurovascularly intact, and immobilization with postop shoe will follow-up with her own foot doctor, which was discussed with the patient's daughter at length.  Amount and/or Complexity of Data Reviewed Radiology: independent interpretation performed. Decision-making details documented in ED Course.  Risk OTC drugs. Diagnosis or treatment significantly limited by social determinants of health.    Final diagnoses:  Closed nondisplaced fracture of proximal phalanx of right great toe, initial encounter    ED Discharge Orders     None          Garrick Charleston, MD 10/01/23 2142

## 2023-10-04 ENCOUNTER — Telehealth: Payer: Self-pay | Admitting: Internal Medicine

## 2023-10-04 NOTE — Telephone Encounter (Signed)
 Confirmed appt for 8/7

## 2023-10-06 ENCOUNTER — Ambulatory Visit: Attending: Family Medicine | Admitting: Pharmacist

## 2023-10-06 ENCOUNTER — Encounter: Payer: Self-pay | Admitting: Pharmacist

## 2023-10-06 DIAGNOSIS — E1169 Type 2 diabetes mellitus with other specified complication: Secondary | ICD-10-CM

## 2023-10-06 DIAGNOSIS — Z7984 Long term (current) use of oral hypoglycemic drugs: Secondary | ICD-10-CM | POA: Diagnosis not present

## 2023-10-06 DIAGNOSIS — E669 Obesity, unspecified: Secondary | ICD-10-CM

## 2023-10-06 DIAGNOSIS — Z794 Long term (current) use of insulin: Secondary | ICD-10-CM

## 2023-10-06 LAB — POCT GLYCOSYLATED HEMOGLOBIN (HGB A1C): HbA1c, POC (controlled diabetic range): 7.6 % — AB (ref 0.0–7.0)

## 2023-10-06 NOTE — Progress Notes (Signed)
 S:     No chief complaint on file.  Kristi Campos is a 73 y.o. female who presents for diabetes evaluation. 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 Presents with her daughter.   Last visit with pharmacist on 09/15/2023 where patient reported adherence to medication regimen and denied hypoglycemia.   Today, she is in good spirits. Her A1c today is 7.6 (down from 8.8 prior). Commended her for this! Denies any changes in symptoms since her last visit. No NV, abdominal pain, or diarrhea. She has some nocturia and visual changes that are at baseline. No changes since I saw her last month. She endorses adherence to her insulin  and Jardiance . Denies any S/sx of hypoglycemia.   Current diabetes medications include: Lantus  32 units once daily, Jardiance  25 mg daily.   Reported home fasting blood sugars: 70s - 150s. Gives 1 outlier since last visit > 200 mg/dL Reported 2 hour post-meal/random blood sugars: not checking  Patient denies polyuria. Patient denies neuropathy (nerve pain). Patient recently underwent cataract surgery. Endorses some blurry vision but is following up with Dr. Octavia. Patient reports self foot exams.   Patient reports adherence to taking all medications as prescribed.   Patient reported dietary habits:  -No changes since her last visit -Eats 2-3 meals daily -Patient doesn't eat a lot of carbs -Drinks apple and orange juice.   Patient-reported exercise habits -Patient doesn't have a current exercising regimen.   Family/Social History:  -Fhx: HTN -Tobacco: never smoked  O:  Lab Results  Component Value Date   HGBA1C 7.6 (A) 10/06/2023   There were no vitals filed for this visit.  Lipid Panel     Component Value Date/Time   CHOL 180 11/02/2022 1116   TRIG 138 11/02/2022 1116   HDL 51 11/02/2022 1116   CHOLHDL 3.5 11/02/2022 1116   LDLCALC 105 (H) 11/02/2022 1116    Clinical Atherosclerotic Cardiovascular Disease (ASCVD): No  The 10-year ASCVD risk score (Arnett DK, et al., 2019) is: 33.4%   Values used to calculate the score:     Age: 42 years     Clincally relevant sex: Female     Is Non-Hispanic African American: Yes     Diabetic: Yes     Tobacco smoker: No     Systolic Blood Pressure: 148 mmHg     Is BP treated: Yes     HDL Cholesterol: 51 mg/dL     Total Cholesterol: 180 mg/dL   A/P: Diabetes longstanding currently near goal. With her comorbid conditions, I think a good A1c goal for her is 7.0 - 7.5%. With her A1c of 7.6% today, she is now meeting out TNM. I expressed to her and her daughter how proud I am of her performance. She denies any current symptoms of hypoglycemia. Patient is able to verbalize appropriate hypoglycemia management plan. Home fasting sugars are mostly at goal. Stressed the need for a balance between good glycemic control and avoiding hypoglycemia/increasing her fall risk. Medication adherence appears appropriate. We will have her continue her current regimen. -Continue Lantus  32 units daily. -Continued Jardiance  25 mg daily.  -Patient educated on purpose, proper use, and potential adverse effects of Lantus .  -Extensively discussed pathophysiology of diabetes, recommended lifestyle interventions, dietary effects on blood sugar control.  -Counseled on s/sx of and management of hypoglycemia.  -Next A1c due 12/2023.  Written patient instructions provided. Patient verbalized understanding of  treatment plan.  Total time in face to face counseling 30 minutes.    Follow-up:  -Follow up with me prn. -Follow-up with Dr. Vicci 11/01/2023.   Kristi Campos, PharmD, Kristi Campos, CPP Clinical Pharmacist [facility] [phone]

## 2023-10-10 ENCOUNTER — Ambulatory Visit: Admitting: Podiatry

## 2023-10-10 ENCOUNTER — Encounter: Payer: Self-pay | Admitting: Podiatry

## 2023-10-10 DIAGNOSIS — S92424A Nondisplaced fracture of distal phalanx of right great toe, initial encounter for closed fracture: Secondary | ICD-10-CM

## 2023-10-10 NOTE — Progress Notes (Signed)
 Patient presents with complaint of a fracture on the hallux right.  She had radiographs done about a.  Surgical shoe.  Says it is feeling better he says it is feeling better and not as painful as it wore.   Physical exam:  General appearance: Pleasant, and in no acute distress. AOx3.  Vascular: Pedal pulses: DP 2/4 bilaterally, PT 1/4 bilaterally.  Moderate edema l hallux right. Capillary fill time immediate bilaterally.  Neurological: Light touch intact feet bilaterally.  Normal Achilles reflex bilaterally.  No clonus or spasticity noted.  Dermatologic:   Skin normal temperature bilaterally.  Skin normal color, tone, and texture bilaterally.   Musculoskeletal: Tenderness at lateral base of distal phalanx hallux right.  Some tenderness of the range of motion in the IPJ of the hallux right.  No crepitation noted.  Resolving ecchymosis hallux right     Diagnosis: 1.  Closed nondisplaced fracture base of distal phalanx hallux right  Plan: -Initiate closed treatment closed fracture distal phalanx hallux right.  She has a surgical shoe will have her continue wearing this.  She has been wearing it for about a week right now.  Minimize activity is much as possible.  -Reviewed x-rays right foot from October 03, 2023 -RICE  Return 2 weeks follow-up fracture hallux right and radiographs 3 views right foot

## 2023-10-24 ENCOUNTER — Ambulatory Visit (INDEPENDENT_AMBULATORY_CARE_PROVIDER_SITE_OTHER): Admitting: Podiatry

## 2023-10-24 ENCOUNTER — Ambulatory Visit (INDEPENDENT_AMBULATORY_CARE_PROVIDER_SITE_OTHER)

## 2023-10-24 DIAGNOSIS — S92424A Nondisplaced fracture of distal phalanx of right great toe, initial encounter for closed fracture: Secondary | ICD-10-CM

## 2023-10-24 DIAGNOSIS — S92424D Nondisplaced fracture of distal phalanx of right great toe, subsequent encounter for fracture with routine healing: Secondary | ICD-10-CM

## 2023-10-24 NOTE — Progress Notes (Signed)
 patient presents 2 weeks status post fracture distal phalanx hallux right.  Doing better not as much pain as initially.  Still wearing surgical shoe for all weightbearing.SABRA   Physical exam:  General appearance: Pleasant, and in no acute distress. AOx3.  Vascular: Pedal pulses: DP 2/4 bilaterally, PT 1/4 bilaterally.  Moderate edema lower legs bilaterally. Capillary fill time immediate bilaterally   Neurological: Grossly intact bilaterally  Dermatologic:   Skin normal temperature bilaterally.  Skin normal color, tone, and texture bilaterally.   Musculoskeletal: Some tenderness at base of distal phalanx on the hallux right.  Some tenderness of the range of motion of the inner phalangeal joint of the hallux.  No crepitation noted.    Radiographs: 3 views right foot: Good healing at fracture site.  No displacement of fracture fragment.  No signs of osteomyelitis.  Diagnosis: 1.  Status post fracture distal phalanx hallux right-routine healing  Plan: -POV. - Patient doing well and improving.  Will have her wear the surgical shoe for all weightbearing for another 2 to 4 weeks.  Minimize activity is much as possible.  Ice and elevate as needed.   Return 2 weeks follow-up fracture hallux right and x-rays 3 views right foot

## 2023-10-28 ENCOUNTER — Other Ambulatory Visit: Payer: Self-pay | Admitting: Internal Medicine

## 2023-10-28 ENCOUNTER — Telehealth: Payer: Self-pay | Admitting: Internal Medicine

## 2023-10-28 DIAGNOSIS — E1159 Type 2 diabetes mellitus with other circulatory complications: Secondary | ICD-10-CM

## 2023-10-28 NOTE — Telephone Encounter (Signed)
 Called patient to confirm upcoming appointment 11/01/2023. Patient appointment has been successfully confirmed

## 2023-11-01 ENCOUNTER — Encounter: Payer: Self-pay | Admitting: Internal Medicine

## 2023-11-01 ENCOUNTER — Ambulatory Visit: Attending: Internal Medicine | Admitting: Internal Medicine

## 2023-11-01 VITALS — BP 133/83 | HR 80 | Temp 98.0°F | Ht 63.0 in | Wt 197.0 lb

## 2023-11-01 DIAGNOSIS — Z23 Encounter for immunization: Secondary | ICD-10-CM

## 2023-11-01 DIAGNOSIS — Z7984 Long term (current) use of oral hypoglycemic drugs: Secondary | ICD-10-CM

## 2023-11-01 DIAGNOSIS — E1169 Type 2 diabetes mellitus with other specified complication: Secondary | ICD-10-CM

## 2023-11-01 DIAGNOSIS — Z794 Long term (current) use of insulin: Secondary | ICD-10-CM | POA: Diagnosis not present

## 2023-11-01 DIAGNOSIS — G231 Progressive supranuclear ophthalmoplegia [Steele-Richardson-Olszewski]: Secondary | ICD-10-CM | POA: Diagnosis not present

## 2023-11-01 DIAGNOSIS — I152 Hypertension secondary to endocrine disorders: Secondary | ICD-10-CM

## 2023-11-01 DIAGNOSIS — R4789 Other speech disturbances: Secondary | ICD-10-CM | POA: Diagnosis not present

## 2023-11-01 DIAGNOSIS — E669 Obesity, unspecified: Secondary | ICD-10-CM

## 2023-11-01 DIAGNOSIS — S92411D Displaced fracture of proximal phalanx of right great toe, subsequent encounter for fracture with routine healing: Secondary | ICD-10-CM | POA: Diagnosis not present

## 2023-11-01 DIAGNOSIS — E785 Hyperlipidemia, unspecified: Secondary | ICD-10-CM

## 2023-11-01 DIAGNOSIS — E119 Type 2 diabetes mellitus without complications: Secondary | ICD-10-CM

## 2023-11-01 DIAGNOSIS — E1159 Type 2 diabetes mellitus with other circulatory complications: Secondary | ICD-10-CM | POA: Diagnosis not present

## 2023-11-01 DIAGNOSIS — E042 Nontoxic multinodular goiter: Secondary | ICD-10-CM | POA: Diagnosis not present

## 2023-11-01 DIAGNOSIS — N1831 Chronic kidney disease, stage 3a: Secondary | ICD-10-CM

## 2023-11-01 MED ORDER — AMLODIPINE BESYLATE 2.5 MG PO TABS: 2.5000 mg | ORAL_TABLET | Freq: Every day | ORAL | 1 refills | Status: AC

## 2023-11-01 NOTE — Patient Instructions (Signed)
 VISIT SUMMARY:  Today, we discussed your ongoing health issues and made some adjustments to your treatment plan. We focused on your progressive supranuclear palsy, diabetes, hypertension, chronic kidney disease, multinodular goiter, and recent toe fracture. We also addressed your general health maintenance needs.  YOUR PLAN:  -PROGRESSIVE SUPRANUCLEAR PALSY WITH SPEECH IMPAIRMENT: Progressive supranuclear palsy is a brain disorder that affects movement, control of walking, and balance, and can cause speech difficulties. We will submit a referral for speech therapy to help manage your speech impairment.  -TYPE 2 DIABETES MELLITUS: Type 2 diabetes is a condition where your body does not use insulin  properly, leading to high blood sugar levels. Your blood sugar control has improved, but we recommend reducing your Twinkie consumption to one per day and increasing your intake of fresh fruits and vegetables. Continue taking Lantus  32 units daily and Jardiance  25 mg daily.  -HYPERTENSION: Hypertension is high blood pressure, which can lead to serious health problems if not managed. Your blood pressure readings have been high, so we are adding amlodipine  2.5 mg once daily to your current medication. Take it with your other blood pressure medication, but if you feel dizzy, take Cozaar  hydrochlorothiazide in the morning and amlodipine  in the evening.  -CHRONIC KIDNEY DISEASE, STAGE 3: Chronic kidney disease is a condition where your kidneys are damaged and can't filter blood as well as they should. We will order lab tests to recheck your kidney function.  -NONTOXIC MULTINODULAR GOITER: A nontoxic multinodular goiter is an enlarged thyroid  gland with multiple nodules that are not cancerous. We will follow up with another ultrasound in February 2026 to monitor the nodules.  -DISPLACED FRACTURE OF PROXIMAL PHALANX OF RIGHT GREAT TOE, ROUTINE HEALING: A displaced fracture means the bone is broken and out of its  normal position. Your right big toe is healing as expected. Continue to follow up with your podiatrist and wear the boot as advised.  -GENERAL HEALTH MAINTENANCE: You are due for a flu shot and a COVID booster. We will give you the flu shot today, and you should get the COVID booster at an outside pharmacy.  INSTRUCTIONS:  Please follow up with your podiatrist as scheduled next Tuesday for your toe fracture. We will also submit a referral for speech therapy. Make sure to get your COVID booster at an outside pharmacy. Additionally, we will order lab tests to recheck your kidney function.

## 2023-11-01 NOTE — Progress Notes (Signed)
 Upon Chart Review with management Barnie Gowda do not reschedule no show with [facility] Aging gracefully seems more appropriate at this time.  Harlene

## 2023-11-01 NOTE — Progress Notes (Signed)
 Patient ID: Kristi Campos, female    DOB: [birth year]  MRN: [id number]  CC: Diabetes (DM f/u. Layvonne a speech therapy referral. Janiece to flu vax)   Subjective: Kristi Campos is a 73 y.o. female who presents for chronic ds management. 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 Octavia), MNG (needs repeat US  04/2024).    Discussed the use of AI scribe software for clinical note transcription with the patient, who gave verbal consent to proceed.  History of Present Illness Kristi Campos is a 73 year old female with progressive supranuclear palsy who presents for a follow-up visit and a referral to speech therapy. She is accompanied by her daughter, Fredie.  She is experiencing worsening speech difficulties associated with her progressive supranuclear palsy. It is becoming increasingly difficult for her to articulate words, and her daughter notes that she tends to speak too quickly. Previous speech therapy sessions have been beneficial in managing these symptoms.  DM: She has a history of diabetes, with her last A1c recorded on August 7th showing improvement from 9.2% to 7.6%. She is currently on Lantus  32 units and Jardiance  25 mg daily. She monitors her blood sugar every morning, with recent readings ranging from 113 to 168 with highest of 192 mg/dL. She consumes two Twinkies daily but avoids sugary drinks.  HTN: Her blood pressure readings at home have been variable, with recent measurements including 125/87 mmHg and 171/101 mmHg. She takes Cozaar  hydrochlorothiazide 100/25 mg, half a tablet daily. She monitors her blood pressure every morning.  HL: on atorvastatin  10 mg daily for cholesterol management.   CKD 3a: Her kidney function was last checked in February, showing GFR 59. Range has been 40-50s.  She has a multinodular goiter with a nodule in the isthmus that does not  currently meet criteria for biopsy but needs repeat imaging in 1 yr which will be 04/2024  She recently broke her right big toe about a month ago after running into a wall with her motorized wheelchair. She has been under the care of a podiatrist and has been wearing a boot since the incident. An x-ray confirmed the fracture, and she is scheduled for a follow-up appointment next Tuesday.    Patient Active Problem List   Diagnosis Date Noted   Stage 3a chronic kidney disease (HCC) 06/24/2022   Patient has active power of attorney for health care 06/24/2022   Progressive supranuclear palsy (HCC) 01/27/2021   AKI (acute kidney injury) (HCC) 10/18/2020   Chest pain 10/17/2020   Primary osteoarthritis, right wrist 02/28/2020   Peripheral arterial disease (HCC) 10/03/2019   Essential hypertension 10/03/2019   Hyperlipidemia 10/03/2019   Type 2 diabetes mellitus with obesity (HCC) 11/24/2018   Chronic right-sided low back pain without sciatica 10/24/2018     Current Outpatient Medications on File Prior to Visit  Medication Sig Dispense Refill   ACCU-CHEK GUIDE TEST test strip USE AS DIRECTED THREE TIMES DAILY 100 strip 2   AMBULATORY NON FORMULARY MEDICATION Please dispense 1 power wheelchair 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     atorvastatin  (LIPITOR) 10 MG tablet Take 1 tablet (10 mg total) by mouth daily. 90 tablet 1   Blood Glucose Monitoring Suppl (ACCU-CHEK GUIDE) w/Device KIT Use to check blood sugar TID. Dx E11.69 1  kit 0   carbidopa -levodopa  (SINEMET  IR) 25-100 MG tablet Take 1 tablet by mouth 3 (three) times daily. 270 tablet 2   diclofenac  Sodium (VOLTAREN ) 1 % GEL Apply 4 g topically 2 (two) times daily as needed. 100 g 1   insulin  glargine (LANTUS  SOLOSTAR) 100 UNIT/ML Solostar Pen Inject 32 Units into the skin daily. 15 mL PRN   Insulin  Pen Needle (B-D ULTRAFINE III SHORT PEN) 31G X 8 MM MISC USE AS DIRECTED 100 each 2    JARDIANCE  25 MG TABS tablet TAKE 1 TABLET(25 MG) BY MOUTH DAILY 90 tablet 2   lidocaine  (LIDODERM ) 5 % Place 1 patch onto the skin daily. Remove & Discard patch within 12 hours or as directed by MD 30 patch 1   losartan -hydrochlorothiazide (HYZAAR) 100-25 MG tablet TAKE 1/2 TABLET BY MOUTH DAILY 45 tablet 1   nitroGLYCERIN  (NITROSTAT ) 0.4 MG SL tablet Place 1 tablet (0.4 mg total) under the tongue every 5 (five) minutes as needed for chest pain. 20 tablet 0   pantoprazole  (PROTONIX ) 40 MG tablet TAKE 1 TABLET BY MOUTH DAILY AT NOON 90 tablet 2   tiZANidine  (ZANAFLEX ) 4 MG capsule Take 4 mg by mouth 3 (three) times daily.     [DISCONTINUED] simvastatin (ZOCOR) 10 MG tablet Take 10 mg by mouth at bedtime.     No current facility-administered medications on file prior to visit.    Allergies  Allergen Reactions   Metformin  And Related Diarrhea    Social History   Socioeconomic History   Marital status: Widowed    Spouse name: Not on file   Number of children: 4   Years of education: 42   Highest education level: 11th grade  Occupational History    Employer: other   Occupation: retired    Comment: weave for living  Tobacco Use   Smoking status: Never   Smokeless tobacco: Never  Vaping Use   Vaping status: Never Used  Substance and Sexual Activity   Alcohol use: No    Alcohol/week: 0.0 standard drinks of alcohol   Drug use: No   Sexual activity: Not Currently    Partners: Male    Birth control/protection: Post-menopausal  Other Topics Concern   Not on file  Social History Narrative   Right handed    Lives at home alone ONE LEVEL   Caffeine1 cup daily   Social Drivers of Health   Financial Resource Strain: High Risk (06/14/2023)   Overall Financial Resource Strain (CARDIA)    Difficulty of Paying Living Expenses: Very hard  Food Insecurity: No Food Insecurity (08/17/2023)   Hunger Vital Sign    Worried About Running Out of Food in the Last Year: Never true    Ran Out of  Food in the Last Year: Never true  Recent Concern: Food Insecurity - Food Insecurity Present (07/14/2023)   Hunger Vital Sign    Worried About Running Out of Food in the Last Year: Sometimes true    Ran Out of Food in the Last Year: Sometimes true  Transportation Needs: No Transportation Needs (08/17/2023)   PRAPARE - Administrator, Civil Service (Medical): No    Lack of Transportation (Non-Medical): No  Physical Activity: Inactive (06/14/2023)   Exercise Vital Sign    Days of Exercise per Week: 0 days    Minutes of Exercise per Session: 0 min  Stress: No Stress Concern Present (06/14/2023)   [employer] of Occupational Health - Occupational Stress Questionnaire  Feeling of Stress : Not at all  Social Connections: Moderately Isolated (06/14/2023)   Social Connection and Isolation Panel    Frequency of Communication with Friends and Family: More than three times a week    Frequency of Social Gatherings with Friends and Family: More than three times a week    Attends Religious Services: More than 4 times per year    Active Member of [employer] or Organizations: No    Attends Banker Meetings: Never    Marital Status: Widowed  Intimate Partner Violence: Not At Risk (08/17/2023)   Humiliation, Afraid, Rape, and Kick questionnaire    Fear of Current or Ex-Partner: No    Emotionally Abused: No    Physically Abused: No    Sexually Abused: No    Family History  Problem Relation Age of Onset   Bipolar disorder Mother    Hypertension Sister    Hypertension Sister    Hypertension Sister    Hypertension Sister    Hypertension Sister    Hypertension Sister    Hypertension Daughter    Diabetes Son    Prostate cancer Son    BRCA 1/2 Neg Hx    Breast cancer Neg Hx     Past Surgical History:  Procedure Laterality Date   ARTERY BIOPSY Left 11/26/2021   Procedure: BIOPSY TEMPORAL ARTERY;  Surgeon: Magda Debby SAILOR, MD;  Location: [facility] OR;  Service: Vascular;   Laterality: Left;   COLONOSCOPY     I & D EXTREMITY Right 08/09/2013   Procedure: MINOR IRRIGATION AND DEBRIDEMENT EXTREMITY;  Surgeon: Lamar Leonor Mickey LULLA, MD;  Location: [facility];  Service: Orthopedics;  Laterality: Right;  long       wound class 4    ROS: Review of Systems Negative except as stated above  PHYSICAL EXAM: BP 133/83   Pulse 80   Temp 98 F (36.7 C) (Oral)   Ht 5' 3 (1.6 m)   Wt 197 lb (89.4 kg)   SpO2 98%   BMI 34.90 kg/m   Wt Readings from Last 3 Encounters:  11/01/23 197 lb (89.4 kg)  06/16/23 195 lb 8 oz (88.7 kg)  06/14/23 195 lb (88.5 kg)    Physical Exam  General appearance - alert, well appearing, and in no distress Mental status - normal mood, behavior, speech, dress, motor activity, and thought processes Neck - supple, no significant adenopathy Chest - clear to auscultation, no wheezes, rales or rhonchi, symmetric air entry Heart - normal rate, regular rhythm, normal S1, S2, no murmurs, rubs, clicks or gallops Neurological - Ambulates with rollator Clyne. Difficulty getting words out Extremities - no LE edema      Latest Ref Rng & Units 04/11/2023    3:15 PM 11/02/2022   11:16 AM 04/21/2022    1:29 PM  CMP  Glucose 70 - 99 mg/dL 848  841  862   BUN 8 - 27 mg/dL 15  18  17    Creatinine 0.57 - 1.00 mg/dL 8.98  8.80  8.82   Sodium 134 - 144 mmol/L 146  144  138   Potassium 3.5 - 5.2 mmol/L 5.0  4.4  3.9   Chloride 96 - 106 mmol/L 106  103  104   CO2 20 - 29 mmol/L 25  26  25    Calcium  8.7 - 10.3 mg/dL 9.7  89.9  9.1   Total Protein 6.0 - 8.5 g/dL  7.6  7.2   Total Bilirubin 0.0 -  1.2 mg/dL  0.4  0.8   Alkaline Phos 44 - 121 IU/L  82  61   AST 0 - 40 IU/L  14  17   ALT 0 - 32 IU/L  9  <5    Lipid Panel     Component Value Date/Time   CHOL 180 11/02/2022 1116   TRIG 138 11/02/2022 1116   HDL 51 11/02/2022 1116   CHOLHDL 3.5 11/02/2022 1116   LDLCALC 105 (H) 11/02/2022 1116    CBC    Component Value Date/Time    WBC 7.2 04/11/2023 1515   WBC 5.4 04/21/2022 1329   RBC 4.44 04/11/2023 1515   RBC 4.36 04/21/2022 1329   HGB 13.4 04/11/2023 1515   HCT 41.9 04/11/2023 1515   PLT 260 04/11/2023 1515   MCV 94 04/11/2023 1515   MCH 30.2 04/11/2023 1515   MCH 30.7 04/21/2022 1329   MCHC 32.0 04/11/2023 1515   MCHC 32.9 04/21/2022 1329   RDW 13.5 04/11/2023 1515   LYMPHSABS 2.1 04/21/2022 1329   MONOABS 0.5 04/21/2022 1329   EOSABS 0.1 04/21/2022 1329   BASOSABS 0.0 04/21/2022 1329    ASSESSMENT AND PLAN: 1. Type 2 diabetes mellitus with obesity (HCC) (Primary) Most recent A1c improved but not at goal.  Our goal is to get her less than 7.  Fasting morning blood sugar readings for the most part have been below 140 so I have not made any changes.  Continue Lantus  insulin  32 units daily and Jardiance  25 mg daily. -She is agreeable to cutting back eating Twinkies from 2 a day to 1 a day.  Encourage intake of more fruits glycemic index like apples, strawberries, berries. - Comprehensive metabolic panel with GFR  2. Long term (current) use of insulin  (HCC) 3. Diabetes mellitus treated with oral medication (HCC) See #1 above.  4. Progressive supranuclear palsy (HCC) Followed by neurology Dr. Evonnie - Ambulatory referral to Speech Therapy  5. Hypertension associated with diabetes (HCC) Not at goal.  Add low-dose amlodipine .  Continue Cozaar /HCTZ 100/25 half a tablet daily. - amLODipine  (NORVASC ) 2.5 MG tablet; Take 1 tablet (2.5 mg total) by mouth daily.  Dispense: 90 tablet; Refill: 1  6. Hyperlipidemia associated with type 2 diabetes mellitus (HCC) Continue atorvastatin  - Lipid panel  7. Stage 3a chronic kidney disease (HCC) We will continue to monitor.  Avoid NSAIDs.  8. Multinodular goiter Needs repeat ultrasound in February of next year to follow-up on nodule that is in the isthmus  9. Closed displaced fracture of proximal phalanx of right great toe with routine healing, subsequent  encounter Wearing a boot. Followed by podiatry  10. Need for influenza vaccination Given today  11. Other speech disturbance -I suspect that this will continue to get worse as her PSP progresses. - Ambulatory referral to Speech Therapy  Patient was given the opportunity to ask questions.  Patient verbalized understanding of the plan and was able to repeat key elements of the plan.   This documentation was completed using Paediatric nurse.  Any transcriptional errors are unintentional.  Orders Placed This Encounter  Procedures   Lipid panel   Comprehensive metabolic panel with GFR   Ambulatory referral to Speech Therapy     Requested Prescriptions   Signed Prescriptions Disp Refills   amLODipine  (NORVASC ) 2.5 MG tablet 90 tablet 1    Sig: Take 1 tablet (2.5 mg total) by mouth daily.    Return in about 4 months (around 03/02/2024) for chronic ds  management.  Barnie Louder, MD, FACP

## 2023-11-02 ENCOUNTER — Ambulatory Visit: Payer: Self-pay | Admitting: Internal Medicine

## 2023-11-02 ENCOUNTER — Other Ambulatory Visit: Payer: Self-pay | Admitting: Internal Medicine

## 2023-11-02 DIAGNOSIS — E1169 Type 2 diabetes mellitus with other specified complication: Secondary | ICD-10-CM

## 2023-11-02 LAB — LIPID PANEL
Chol/HDL Ratio: 3 ratio (ref 0.0–4.4)
Cholesterol, Total: 177 mg/dL (ref 100–199)
HDL: 59 mg/dL (ref >39)
LDL Chol Calc (NIH): 99 mg/dL (ref 0–99)
Triglycerides: 107 mg/dL (ref 0–149)
VLDL Cholesterol Cal: 19 mg/dL (ref 5–40)

## 2023-11-02 LAB — COMPREHENSIVE METABOLIC PANEL WITH GFR
ALT: 6 IU/L (ref 0–32)
AST: 14 IU/L (ref 0–40)
Albumin: 4.3 g/dL (ref 3.8–4.8)
Alkaline Phosphatase: 87 IU/L (ref 44–121)
BUN/Creatinine Ratio: 18 (ref 12–28)
BUN: 19 mg/dL (ref 8–27)
Bilirubin Total: 0.5 mg/dL (ref 0.0–1.2)
CO2: 22 mmol/L (ref 20–29)
Calcium: 10.1 mg/dL (ref 8.7–10.3)
Chloride: 102 mmol/L (ref 96–106)
Creatinine, Ser: 1.04 mg/dL — ABNORMAL HIGH (ref 0.57–1.00)
Globulin, Total: 3.4 g/dL (ref 1.5–4.5)
Glucose: 122 mg/dL — ABNORMAL HIGH (ref 70–99)
Potassium: 4.3 mmol/L (ref 3.5–5.2)
Sodium: 139 mmol/L (ref 134–144)
Total Protein: 7.7 g/dL (ref 6.0–8.5)
eGFR: 57 mL/min/1.73 — ABNORMAL LOW (ref >59)

## 2023-11-02 MED ORDER — ATORVASTATIN CALCIUM 20 MG PO TABS
20.0000 mg | ORAL_TABLET | Freq: Every day | ORAL | 1 refills | Status: AC
Start: 2023-11-02 — End: ?

## 2023-11-07 NOTE — Telephone Encounter (Signed)
 Copied from CRM [id number]. Topic: Clinical - Lab/Test Results >> Nov 07, 2023  3:54 PM Delon DASEN wrote: Reason for CRM: read results verbatim, no questions

## 2023-11-08 ENCOUNTER — Encounter: Payer: Self-pay | Admitting: Podiatry

## 2023-11-08 ENCOUNTER — Ambulatory Visit (INDEPENDENT_AMBULATORY_CARE_PROVIDER_SITE_OTHER)

## 2023-11-08 ENCOUNTER — Ambulatory Visit (INDEPENDENT_AMBULATORY_CARE_PROVIDER_SITE_OTHER): Admitting: Podiatry

## 2023-11-08 VITALS — Ht 63.0 in | Wt 197.0 lb

## 2023-11-08 DIAGNOSIS — S92424D Nondisplaced fracture of distal phalanx of right great toe, subsequent encounter for fracture with routine healing: Secondary | ICD-10-CM

## 2023-11-08 NOTE — Progress Notes (Signed)
 Patient presents for follow-up closed fracture hallux right.  Doing much better.  Just a few aches and a little bit of swelling.   Physical exam:  General appearance: Pleasant, and in no acute distress. AOx3.  Vascular: Pedal pulses: DP 2/4 bilaterally, PT 1/4 bilaterally.  Moderate edema lower legs bilaterally. Capillary fill time immediate bilaterally.  Neurological: Grossly intact bilaterally  Dermatologic:   Skin normal temperature bilaterally.  Skin normal color, tone, and texture bilaterally.   Musculoskeletal: Slight soreness around the distal phalanx of the hallux right.  No tenderness with range of motion of the IPJ of the hallux right.  Radiographs: 3 views right foot.  Good healing at fracture site.  Normal alignment.  No evidence of osteomyelitis. Diagnosis: 1.  4-week status post fracture distal phalanx hallux right.  Plan: -POV.  Healed well from fracture.  Explained she can have some aches and soreness and edema even up to a year after the fracture.  She can go back to her regular shoe.  Would not recommend going barefoot.  As far as her fracture concerned she is released   Return r as needed

## 2023-11-11 ENCOUNTER — Telehealth: Payer: Self-pay

## 2023-11-11 NOTE — Telephone Encounter (Signed)
 Daughter has been informed that patient should be taking 20 mg of her Cholesterol medication.     Copied from CRM #[id number]. Topic: Clinical - Medication Question >> Nov 10, 2023 12:43 PM Fonda T wrote: Reason for CRM: Received call from daughter of patient, Fredie, requesting a return call regarding change in cholesterol medication instructions.  Patient daughter can be reached at [phone], to discuss further.   Aware of same day call back.

## 2023-11-29 ENCOUNTER — Other Ambulatory Visit: Payer: Self-pay

## 2023-11-29 NOTE — Patient Outreach (Signed)
 Aging Gracefully Program  11/29/2023  Kristi Campos 1951-01-03 [id number]   [facility] Evaluation Interviewer attempted to call patient on today regarding Aging Gracefully referral. No answer from patient after multiple rings. CMA left confidential voicemail for patient to return call.  Will attempt to call back within 1 week.    [facility]  Population Health Care Management Assistant  Direct Dial : [phone]  Fax: [phone] Website: [email or web address]

## 2023-12-21 ENCOUNTER — Other Ambulatory Visit: Payer: Self-pay

## 2023-12-21 NOTE — Patient Outreach (Signed)
 Aging Gracefully Program  12/21/2023  Kristi Campos 08-25-1950 [id number]   [facility] Evaluation Interviewer attempted to call patient on today regarding Aging Gracefully referral. No answer from patient after multiple rings. CMA left confidential voicemail for patient to return call.  Will attempt to call back within 1 week.    [facility]  Population Health Care Management Assistant  Direct Dial : [phone]  Fax: [phone] Website: [email or web address]

## 2024-01-03 NOTE — Progress Notes (Unsigned)
 Assessment/Plan:   1.  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  Understands that there is an increased risk of falls, aspiration as subsequent morbidity and mortality from this disease compared to Parkinson's disease.             -Patient did not do well off of levodopa , so wants to continue carbidopa /levodopa  25/100, 1 tablet 3 times per day             -Patient does not drive.  She does live by herself, but daughter lives next door.        2.  Eyelid opening apraxia             -Not interested in Botox right now.   3.  Vision change/generalized fatigue jaw claudication             -TA biopsy negative             -Her vision issues really sound most consistent with PSP.  She has diplopia, which is likely due to PSP, but the blurry vision may be due to uncontrolled diabetes in combination with her cataracts.  She is scheduled to have cataract surgery soon.      4.  Dizziness             -Improved after blood pressure medications decreased   5.  Dysphagia             -She had a modified barium swallow May 11, 2022 and December 20, 2022.  There is no deterioration between the 2 studies.  This demonstrated moderately impaired oral phase and mildly impaired pharyngeal phase.  No aspiration was observed.  Mechanical soft diet with thin liquids was recommended.             -***she reports that she doesn't want feeding tube but would want intubation/chest compressions (she initially said she would not but changed her mind).  Her daughter is going to be working on changing advanced directives  6.  Uncontrolled diabetes  - Her hemoglobin A1c is 9.2.  Primary care is adjusting her medications and she is following up with.  We spent some time discussing what a proper diabetic diet might look like and she was quite surprised!   Subjective:   Tylin Coste was seen today in follow-up for PSP.  She is accompanied by her daughter who supplements the  history.  At our last visit, the patient had just received her motorized wheelchair and was learning how to use it.  Unfortunately, she was in emergency room August 2 after she was using that wheelchair and ran her foot into the wall while using it.  She ended up sustaining an intra-articular fracture at the base of the first distal phalanx.  She subsequently followed up with orthopedics.  That did heal well.  She still her primary care physician September 2.  Those notes are reviewed.  She was referred to speech therapy at that visit.  However, it does not look like she was contacted about the referral ***.last visit, I did ask patient/daughter to update their advance care directives and they note that ***   Current prescribed movement disorder medications: Carbidopa /levodopa  25/100, 1 tablet 3 times per day. Zanaflex , 2 mg at bed   ALLERGIES:   Allergies  Allergen Reactions   Metformin  And Related Diarrhea    CURRENT MEDICATIONS:  Outpatient Encounter Medications as of  01/05/2024  Medication Sig   ACCU-CHEK GUIDE TEST test strip USE AS DIRECTED THREE TIMES DAILY   AMBULATORY NON FORMULARY MEDICATION Please dispense 1 power wheelchair PSP patient DX code G23.1   amLODipine  (NORVASC ) 2.5 MG tablet Take 1 tablet (2.5 mg total) by mouth daily.   aspirin  EC 81 MG tablet Take 81 mg by mouth daily.   atorvastatin  (LIPITOR) 20 MG tablet Take 1 tablet (20 mg total) by mouth daily. Dose increase 11/02/2023   Blood Glucose Monitoring Suppl (ACCU-CHEK GUIDE) w/Device KIT Use to check blood sugar TID. Dx E11.69   carbidopa -levodopa  (SINEMET  IR) 25-100 MG tablet Take 1 tablet by mouth 3 (three) times daily.   diclofenac  Sodium (VOLTAREN ) 1 % GEL Apply 4 g topically 2 (two) times daily as needed.   insulin  glargine (LANTUS  SOLOSTAR) 100 UNIT/ML Solostar Pen Inject 32 Units into the skin daily.   Insulin  Pen Needle (B-D ULTRAFINE III SHORT PEN) 31G X 8 MM MISC USE AS DIRECTED   JARDIANCE  25 MG TABS tablet  TAKE 1 TABLET(25 MG) BY MOUTH DAILY   lidocaine  (LIDODERM ) 5 % Place 1 patch onto the skin daily. Remove & Discard patch within 12 hours or as directed by MD   losartan -hydrochlorothiazide (HYZAAR) 100-25 MG tablet TAKE 1/2 TABLET BY MOUTH DAILY   nitroGLYCERIN  (NITROSTAT ) 0.4 MG SL tablet Place 1 tablet (0.4 mg total) under the tongue every 5 (five) minutes as needed for chest pain.   pantoprazole  (PROTONIX ) 40 MG tablet TAKE 1 TABLET BY MOUTH DAILY AT NOON   tiZANidine  (ZANAFLEX ) 4 MG capsule Take 4 mg by mouth 3 (three) times daily.   [DISCONTINUED] simvastatin (ZOCOR) 10 MG tablet Take 10 mg by mouth at bedtime.   No facility-administered encounter medications on file as of 01/05/2024.    Objective:   PHYSICAL EXAMINATION:    VITALS:   There were no vitals filed for this visit.     GEN:  The patient appears stated age and is in NAD. HEENT:  Normocephalic, atraumatic.  The mucous membranes are moist. The superficial temporal arteries are without ropiness or tenderness.   CV  RRR Lungs:  CTAB Neck:  no bruits.  Chin toward check   Neurological examination:  Orientation: The patient is alert and oriented x3.  Cranial nerves: There is good facial symmetry. There is facial hypomimia.  Extraocular muscles are intact.  there is some eye opening apraxia.  Downgaze was just slightly decreased. No significant square wave jerks.  The visual fields are full to confrontational testing. The speech is fluent and dysarthric. some stuttering (same as previous).  She has mild trouble with the gutteral sounds.  Soft palate rises symmetrically and there is no tongue deviation. Hearing is intact to conversational tone. Sensation: Sensation is intact to light touch Motor: Strength is at least antigravity x 4    Movement examination: Tone: There is normal tone in the bilateral upper extremities.  The tone in the lower extremities is normal.  Abnormal movements: As above, she has some eyelid opening  apraxia.  She has some pulling of the right face intermittently. Coordination:  There is decremation, with any form of RAMS, including alternating supination and pronation of the forearm, hand opening and closing, finger taps, heel taps and toe taps on the L Gait and Station: not tested today  I have reviewed and interpreted the following labs independently    Chemistry      Component Value Date/Time   NA 139 11/01/2023 1055  K 4.3 11/01/2023 1055   CL 102 11/01/2023 1055   CO2 22 11/01/2023 1055   BUN 19 11/01/2023 1055   CREATININE 1.04 (H) 11/01/2023 1055      Component Value Date/Time   CALCIUM  10.1 11/01/2023 1055   ALKPHOS 87 11/01/2023 1055   AST 14 11/01/2023 1055   ALT 6 11/01/2023 1055   BILITOT 0.5 11/01/2023 1055       Lab Results  Component Value Date   WBC 7.2 04/11/2023   HGB 13.4 04/11/2023   HCT 41.9 04/11/2023   MCV 94 04/11/2023   PLT 260 04/11/2023    Lab Results  Component Value Date   TSH 0.962 04/11/2023   Lab Results  Component Value Date   ESRSEDRATE 32 11/23/2021   Lab Results  Component Value Date   CRP 7 11/23/2021   Lab Results  Component Value Date   HGBA1C 7.6 (A) 10/06/2023     Total time spent on today's visit was *** minutes, including both face-to-face time and nonface-to-face time.  Time included that spent on review of records (prior notes available to me/labs/imaging if pertinent), discussing treatment and goals, answering patient's questions and coordinating care.  Cc:  Vicci Barnie NOVAK, MD

## 2024-01-05 ENCOUNTER — Ambulatory Visit (INDEPENDENT_AMBULATORY_CARE_PROVIDER_SITE_OTHER): Admitting: Neurology

## 2024-01-05 ENCOUNTER — Encounter: Payer: Self-pay | Admitting: Neurology

## 2024-01-05 ENCOUNTER — Other Ambulatory Visit: Payer: Self-pay

## 2024-01-05 VITALS — BP 122/76 | HR 73 | Ht 63.0 in | Wt 197.0 lb

## 2024-01-05 DIAGNOSIS — G231 Progressive supranuclear ophthalmoplegia [Steele-Richardson-Olszewski]: Secondary | ICD-10-CM | POA: Diagnosis not present

## 2024-01-05 DIAGNOSIS — R1319 Other dysphagia: Secondary | ICD-10-CM

## 2024-01-08 NOTE — Progress Notes (Unsigned)
 S:     No chief complaint on file.  73 y.o. female who presents for diabetes evaluation, education, and management. 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   Patient was referred and last seen by Primary Care Provider, Dr. Vicci, on 11/01/23. At last visit, no medication changes were made. Dr. Vicci instructed patient to reduce her snacking on sweets intake and increase low glycemic index foods.   Pharmacy has seen patient for some time. Last visit on 10/06/23 with no medication changes made. A1c at the time was 7.6%, down from 8.8% prior.   Today, patient arrives in good spirits and presents with her daughter. She reports recent heartburn symptoms when eating. She has a rxn for pantoprazole  but has not filled in several months. Denies any recent changes in diet that could contribute. Of note, she is incorporating more low glycemic index foods into her diet. Otherwise, no changes regarding her DM diabetes. FBG this morning was 178 mg/dL. Patient last ate around 5pm yesterday,had not eaten anything, and denies any snacking. No s/sx of hypoglycemia.  Family/Social History:  -Fhx: HTN -Tobacco: never smoked  Current diabetes medications include: Lantus  32 units subcutaneously once daily, Jardiance  25 mg Current hypertension medications include: amlodipine  2.5 mg daily, losartan -hydrochlorothiazide 100-25 mg (half a tablet a day) Current hyperlipidemia medications include: atorvastatin  20 mg  Insurance coverage: [facility] Medicare  Patient denies hypoglycemic events.  Patient denies nocturia (nighttime urination).  Patient denies neuropathy (nerve pain). Patient denies visual changes. Patient reports self foot exams.   Patient reported dietary habits:  -Making improvements with more low glycemic index foods and Twinkies consumption decreased to 1 -Eats 2-3 meals daily -Patient doesn't eat a lot of  carbs -Drinks apple and orange juice.    Patient-reported exercise habits -Patient doesn't have a current exercising regimen.   O:  Reported home fasting blood sugars: 178 mg/dL  Reported 2 hour post-meal/random blood sugars: does not check.  Lab Results  Component Value Date   HGBA1C 8.8 (A) 01/09/2024   There were no vitals filed for this visit.  Lipid Panel     Component Value Date/Time   CHOL 177 11/01/2023 1055   TRIG 107 11/01/2023 1055   HDL 59 11/01/2023 1055   CHOLHDL 3.0 11/01/2023 1055   LDLCALC 99 11/01/2023 1055    Clinical Atherosclerotic Cardiovascular Disease (ASCVD): Yes  The 10-year ASCVD risk score (Arnett DK, et al., 2019) is: 26%   Values used to calculate the score:     Age: 46 years     Clincally relevant sex: Female     Is Non-Hispanic African American: Yes     Diabetic: Yes     Tobacco smoker: No     Systolic Blood Pressure: 122 mmHg     Is BP treated: Yes     HDL Cholesterol: 59 mg/dL     Total Cholesterol: 177 mg/dL   A/P: Diabetes longstanding is currently uncontrolled. A1c today of 8.8% which is up from 7.6% back in August. Based on A1c and FBG reported, current insulin  dose will need to be adjusted. Currently asymptomatic, patient is able to verbalize appropriate hypoglycemia management plan. Plan to titrate insulin  based on FBG readings. Encouraged patient to work towards 150s FBG.  -Increased dose of basal insulin  Lantus  (insulin  glargine) from 32 units to 36 units daily in the morning for 7 days. Patient will continue  to titrate to 40 units once daily IF fasting blood sugar > 150mg /dl at that time.  -Continued SGLT2-I Jardiance  (empagliflozin ) 25 mg. Counseled on sick day rules. -Patient educated on purpose, proper use, and potential adverse effects of Insulin .  -Extensively discussed pathophysiology of diabetes, recommended lifestyle interventions, dietary effects on blood sugar control.  -Counseled on s/sx of and management of  hypoglycemia.  -Next A1c anticipated 04/2024.   Heartburn Patient noted that she as out of her pantoprazole , which is contributing to her current heartburn. Pantoprazole  refill was called into pharmacy for her to pick up.  Written patient instructions provided. Patient verbalized understanding of treatment plan.  Total time in face to face counseling 30 minutes.    Follow-up:  Pharmacist 02/09/24 PCP clinic visit on 03/02/2024 with Dr. Vicci Jenkins Graces, PharmD PGY1 Pharmacy Resident [phone]

## 2024-01-09 ENCOUNTER — Ambulatory Visit: Attending: Internal Medicine | Admitting: Pharmacist

## 2024-01-09 ENCOUNTER — Encounter: Payer: Self-pay | Admitting: Pharmacist

## 2024-01-09 DIAGNOSIS — E669 Obesity, unspecified: Secondary | ICD-10-CM | POA: Diagnosis not present

## 2024-01-09 DIAGNOSIS — E119 Type 2 diabetes mellitus without complications: Secondary | ICD-10-CM | POA: Diagnosis not present

## 2024-01-09 DIAGNOSIS — Z794 Long term (current) use of insulin: Secondary | ICD-10-CM | POA: Diagnosis not present

## 2024-01-09 DIAGNOSIS — Z7984 Long term (current) use of oral hypoglycemic drugs: Secondary | ICD-10-CM | POA: Diagnosis not present

## 2024-01-09 LAB — POCT GLYCOSYLATED HEMOGLOBIN (HGB A1C): HbA1c, POC (controlled diabetic range): 8.8 % — AB (ref 0.0–7.0)

## 2024-01-09 MED ORDER — FAMOTIDINE 20 MG PO TABS
20.0000 mg | ORAL_TABLET | Freq: Two times a day (BID) | ORAL | 0 refills | Status: AC
Start: 2024-01-09 — End: ?

## 2024-01-09 MED ORDER — LANTUS SOLOSTAR 100 UNIT/ML ~~LOC~~ SOPN: 36.0000 [IU] | PEN_INJECTOR | Freq: Every day | SUBCUTANEOUS | 1 refills | Status: DC

## 2024-01-09 MED ORDER — TIZANIDINE HCL 4 MG PO CAPS: 4.0000 mg | ORAL_CAPSULE | Freq: Three times a day (TID) | ORAL | 3 refills | Status: DC

## 2024-01-10 ENCOUNTER — Telehealth: Payer: Self-pay | Admitting: Internal Medicine

## 2024-01-10 NOTE — Telephone Encounter (Signed)
 Copied from CRM [id number]. Topic: Clinical - Prescription Issue >> Jan 10, 2024  8:51 AM Charlet HERO wrote:  Reason for CRM: Patient daughter is calling about meds being $90 the pharmacy informed her that Dr would need to reach out to insurance for her to get at no cost. Please call patient back with further information.

## 2024-01-11 ENCOUNTER — Other Ambulatory Visit: Payer: Self-pay

## 2024-01-11 NOTE — Patient Outreach (Signed)
 Aging Gracefully Program  01/11/2024  Kristi Campos Jul 17, 1950 [id number]   [facility] Evaluation Interviewer attempted to call patient on today regarding Aging Gracefully referral. No answer from patient after multiple rings. CMA left confidential voicemail for patient to return call.  Will attempt to call back within 1 week.    [facility]  Population Health Care Management Assistant  Direct Dial : [phone]  Fax: [phone] Website: [email or web address]

## 2024-01-11 NOTE — Telephone Encounter (Signed)
 Kristi Campos: find out from daughter which med or meds she is referring to then reply to everyone included on this message. Not enough information contained in this original message. May be something that [facility] or [facility] can assist with.

## 2024-01-12 NOTE — Telephone Encounter (Signed)
 Called & spoke to [employer].   Pricing of medications are as follows: Tiazadine - $200. Insurance is not covering a 30 day supply even with a prior authorization.   Please see if there are other alternatives.   Yesterday 01/11/24 the patient picked up the Lantus , famotidine, pantoprazole  with no copay. The representative stated that she she may have been previously informed that there was a copay because they were on backorder but once they were back in stock the copay was disqualified.

## 2024-01-13 ENCOUNTER — Other Ambulatory Visit: Payer: Self-pay | Admitting: Pharmacist

## 2024-01-13 ENCOUNTER — Other Ambulatory Visit: Payer: Self-pay

## 2024-01-13 MED ORDER — TIZANIDINE HCL 4 MG PO TABS: 4.0000 mg | ORAL_TABLET | Freq: Three times a day (TID) | ORAL | 0 refills | Status: DC

## 2024-01-13 NOTE — Telephone Encounter (Signed)
 Called & spoke to [facility] (authorized to receive information per DPR on file). Informed that Tizanidine  was originally sent as capsules that insurance does not cover. It has now be resent as tablets that should afford coverage. [name] expressed verbal understanding.

## 2024-01-14 ENCOUNTER — Other Ambulatory Visit: Payer: Self-pay | Admitting: Internal Medicine

## 2024-01-14 DIAGNOSIS — I152 Hypertension secondary to endocrine disorders: Secondary | ICD-10-CM

## 2024-01-14 NOTE — Telephone Encounter (Signed)
 PC placed to pt's daughter Kristi Campos this evening. I informed her that I was calling about the Tizanidine  RF request. I inquired who was prescribing the med in the past for pt. Looks like it appeared on med list earlier this yr as a historical med. Kristi Campos stated that it was prescribed by her spine specialist Dr. Bonner for back pain. Had injs to the back in past but since then she has been doing well. Has not had to see him in over 1 yr. She took Tizanidine  PRN but was out for a while. Kristi Campos reports when pt was taking in the past it made her very drowsy and she would try to eat them like candy. She had to remind pt not to take every day because of this. She does not feel her mother needs the medication any more because she has not c/o any back pain in a while. Rxn sent in yesterday by out clinical pharmacist for Tizanidine  to take TID. Kristi Campos has not pick up as yet. I advised not to pick up. I will call pharmacist when they open tomorrow to cancel the rxn. Pt has PSP and I think taking Tizanidine  can increase her risk for fall. Advise to use Tylenol  500 mg PRN when needed for pain. Daughter is in agreement with this plan.

## 2024-01-16 ENCOUNTER — Other Ambulatory Visit: Payer: Self-pay | Admitting: Internal Medicine

## 2024-01-16 NOTE — Telephone Encounter (Signed)
 Called [facility] and spoke to Corinne who canceled the prescription for Tizanidine . No further assistance needed at this time.

## 2024-01-16 NOTE — Telephone Encounter (Signed)
 Please cal [facility] and cancel Tizanidine  prescription.

## 2024-01-19 ENCOUNTER — Telehealth: Payer: Self-pay | Admitting: Neurology

## 2024-01-19 NOTE — Telephone Encounter (Signed)
 Who's calling (name and relationship to patient) : Tomeka Loftis; daughter  Best contact number: [phone]  Provider they see: Dr. Evonnie  Reason for call: Kristi Campos dropped off paper work that was already filled out stating that some questions were not filled out. Question 1 and question 11 pt. 2 not completed. She has to turned the form back in on the 30th.

## 2024-01-19 NOTE — Telephone Encounter (Signed)
 Physical Therapist [name] : Speech Therapy 1x for 9 wks and physical therapy eval Needs verbal order, secure voicemail [phone]

## 2024-01-19 NOTE — Telephone Encounter (Signed)
 Paperwork filled out pt called to come get it copy was made

## 2024-01-20 NOTE — Telephone Encounter (Signed)
 Called and spoke to [employer]. Informed her that ok for the ST.   Informed Alden that Dr. Evonnie does not want patient walking without assist so what is PT going to be doing/teaching? Its why she has a motorized WC now. [name] stated that a PT evaluate will be done and teach patient to safely transfer and evaluate strength. Patient has been using a Rolator and Crescenzo around the house.

## 2024-01-21 ENCOUNTER — Other Ambulatory Visit: Payer: Self-pay | Admitting: Internal Medicine

## 2024-01-21 DIAGNOSIS — E669 Obesity, unspecified: Secondary | ICD-10-CM

## 2024-01-22 ENCOUNTER — Other Ambulatory Visit: Payer: Self-pay | Admitting: Internal Medicine

## 2024-01-22 DIAGNOSIS — E669 Obesity, unspecified: Secondary | ICD-10-CM

## 2024-01-23 ENCOUNTER — Encounter (HOSPITAL_COMMUNITY): Payer: Self-pay

## 2024-01-23 ENCOUNTER — Emergency Department (HOSPITAL_COMMUNITY)
Admission: EM | Admit: 2024-01-23 | Discharge: 2024-01-24 | Disposition: A | Discharge status: Home / Self Care | Attending: Emergency Medicine | Admitting: Emergency Medicine

## 2024-01-23 ENCOUNTER — Other Ambulatory Visit: Payer: Self-pay

## 2024-01-23 ENCOUNTER — Emergency Department (HOSPITAL_COMMUNITY)

## 2024-01-23 DIAGNOSIS — N189 Chronic kidney disease, unspecified: Secondary | ICD-10-CM | POA: Insufficient documentation

## 2024-01-23 DIAGNOSIS — E1122 Type 2 diabetes mellitus with diabetic chronic kidney disease: Secondary | ICD-10-CM | POA: Insufficient documentation

## 2024-01-23 DIAGNOSIS — Z7982 Long term (current) use of aspirin: Secondary | ICD-10-CM | POA: Insufficient documentation

## 2024-01-23 DIAGNOSIS — S0012XA Contusion of left eyelid and periocular area, initial encounter: Secondary | ICD-10-CM | POA: Insufficient documentation

## 2024-01-23 DIAGNOSIS — S0083XA Contusion of other part of head, initial encounter: Secondary | ICD-10-CM

## 2024-01-23 DIAGNOSIS — Z794 Long term (current) use of insulin: Secondary | ICD-10-CM | POA: Insufficient documentation

## 2024-01-23 DIAGNOSIS — W010XXA Fall on same level from slipping, tripping and stumbling without subsequent striking against object, initial encounter: Secondary | ICD-10-CM | POA: Diagnosis not present

## 2024-01-23 DIAGNOSIS — W19XXXA Unspecified fall, initial encounter: Secondary | ICD-10-CM

## 2024-01-23 DIAGNOSIS — S0990XA Unspecified injury of head, initial encounter: Secondary | ICD-10-CM | POA: Diagnosis present

## 2024-01-23 NOTE — Discharge Instructions (Signed)
 You were seen in the ER today after a fall. Your imaging of your head and neck were thankfully normal with no concerning findings. You do appear to have a small hematoma to the left eyebrow which will take some time to go down and may be mildly painful for several days. Take Tylenol  for pain as needed. For any concerns of new or worsening symptoms, return to the ER.

## 2024-01-23 NOTE — ED Triage Notes (Signed)
 Pt BIB EMS from Home due to mechanical fall, no LOC, no blood thinners, pt did hit front left temple on cabinet. Pt GCS 15; AAOx4. Hx of PSP neuro degenerative disorder. Pt ambulates with [facility] at home.  BP 122/88 HR 100 RR 16 SpO2 96%

## 2024-01-23 NOTE — ED Notes (Signed)
 Contacted [facility] and set up transport for pt.

## 2024-01-23 NOTE — Telephone Encounter (Signed)
 Called Kristi Campos and provided her with verbal OK for PT. [name] thanked me for the call and had no further questions.

## 2024-01-24 NOTE — ED Provider Notes (Signed)
 [place] EMERGENCY DEPARTMENT AT [facility] Provider Note   CSN: [id number] Arrival date & time: 01/23/24  8161     Patient presents with: Fall and Head Injury   Kristi Campos is a 73 y.o. female.  Patient presented to the emergency department today with concerns of a fall and head injury.  Reportedly fell after try to use her [place] and tripped over the [place].  Past history significant for type 2 diabetes, peripheral artery disease, progressive supranuclear palsy, CKD.  She reports no significant area pain but has a small hematoma to the left periorbital region.  Denies any significant headache, nausea, or visual disturbance.  She is not current on any blood thinners.   Fall  Head Injury      Prior to Admission medications   Medication Sig Start Date End Date Taking? Authorizing Provider  ACCU-CHEK GUIDE TEST test strip USE AS DIRECTED THREE TIMES DAILY 01/22/24   Vicci Barnie NOVAK, MD  AMBULATORY NON FORMULARY MEDICATION Please dispense 1 power wheelchair PSP patient DX code G23.1 06/18/22   Tat, Asberry RAMAN, DO  amLODipine  (NORVASC ) 2.5 MG tablet Take 1 tablet (2.5 mg total) by mouth daily. 11/01/23   Vicci Barnie NOVAK, MD  aspirin  EC 81 MG tablet Take 81 mg by mouth daily.    [provider]  atorvastatin  (LIPITOR) 20 MG tablet Take 1 tablet (20 mg total) by mouth daily. Dose increase 11/02/2023 11/02/23   Vicci Barnie NOVAK, MD  Blood Glucose Monitoring Suppl (ACCU-CHEK GUIDE) w/Device KIT Use to check blood sugar TID. Dx E11.69 10/30/21   Vicci Barnie NOVAK, MD  carbidopa -levodopa  (SINEMET  IR) 25-100 MG tablet Take 1 tablet by mouth 3 (three) times daily. 06/16/23   Tat, Asberry RAMAN, DO  diclofenac  Sodium (VOLTAREN ) 1 % GEL Apply 4 g topically 2 (two) times daily as needed. 10/04/22   Newlin, Enobong, MD  famotidine  (PEPCID ) 20 MG tablet Take 1 tablet (20 mg total) by mouth 2 (two) times daily. 01/09/24   Vicci Barnie NOVAK, MD  insulin  glargine (LANTUS  SOLOSTAR) 100  UNIT/ML Solostar Pen Inject 40 Units into the skin daily. 01/22/24   Vicci Barnie NOVAK, MD  Insulin  Pen Needle (B-D ULTRAFINE III SHORT PEN) 31G X 8 MM MISC USE AS DIRECTED 11/03/22   Vicci Barnie NOVAK, MD  JARDIANCE  25 MG TABS tablet TAKE 1 TABLET(25 MG) BY MOUTH DAILY 05/31/23   Vicci Barnie NOVAK, MD  lidocaine  (LIDODERM ) 5 % Place 1 patch onto the skin daily. Remove & Discard patch within 12 hours or as directed by MD 10/04/22   Delbert Clam, MD  losartan -hydrochlorothiazide (HYZAAR) 100-25 MG tablet TAKE 1/2 TABLET BY MOUTH DAILY 10/28/23   Vicci Barnie NOVAK, MD  nitroGLYCERIN  (NITROSTAT ) 0.4 MG SL tablet Place 1 tablet (0.4 mg total) under the tongue every 5 (five) minutes as needed for chest pain. 10/18/20   Krishnan, Gokul, MD  pantoprazole  (PROTONIX ) 40 MG tablet TAKE 1 TABLET BY MOUTH DAILY AT NOON 08/17/23   Vicci Barnie NOVAK, MD  simvastatin (ZOCOR) 10 MG tablet Take 10 mg by mouth at bedtime. 01/01/19 02/28/20  [provider]    Allergies: Metformin  and related    Review of Systems  HENT:         Head injury  All other systems reviewed and are negative.   Updated Vital Signs BP 138/73 (BP Location: Right Arm)   Pulse 100   Temp 98.4 F (36.9 C) (Oral)   Resp 16   SpO2 99%  Physical Exam Vitals and nursing note reviewed.  Constitutional:      General: She is not in acute distress.    Appearance: She is well-developed.  HENT:     Head: Normocephalic and atraumatic.      Comments: Small hematoma lateral to the left eyebrow. No lacerations seen. EOMS intact and PERRL. Eyes:     Conjunctiva/sclera: Conjunctivae normal.  Cardiovascular:     Rate and Rhythm: Normal rate and regular rhythm.     Heart sounds: No murmur heard. Pulmonary:     Effort: Pulmonary effort is normal. No respiratory distress.     Breath sounds: Normal breath sounds.  Abdominal:     Palpations: Abdomen is soft.     Tenderness: There is no abdominal tenderness.  Musculoskeletal:         General: No swelling or tenderness.     Cervical back: Neck supple.     Comments: No swelling, tenderness, or deformity to upper or lower extremities.  Skin:    General: Skin is warm and dry.     Capillary Refill: Capillary refill takes less than 2 seconds.  Neurological:     Mental Status: She is alert.  Psychiatric:        Mood and Affect: Mood normal.     (all labs ordered are listed, but only abnormal results are displayed) Labs Reviewed - No data to display  EKG: None  Radiology: CT Cervical Spine Wo Contrast Result Date: 01/23/2024 CLINICAL DATA:  Clemens, neck trauma EXAM: CT CERVICAL SPINE WITHOUT CONTRAST TECHNIQUE: Multidetector CT imaging of the cervical spine was performed without intravenous contrast. Multiplanar CT image reconstructions were also generated. RADIATION DOSE REDUCTION: This exam was performed according to the departmental dose-optimization program which includes automated exposure control, adjustment of the mA and/or kV according to patient size and/or use of iterative reconstruction technique. COMPARISON:  04/08/2023 FINDINGS: Alignment: Alignment is anatomic. Skull base and vertebrae: No acute fracture. No primary bone lesion or focal pathologic process. Soft tissues and spinal canal: No prevertebral fluid or swelling. No visible canal hematoma. Disc levels: Mild multilevel spondylosis greatest at C3-4 and C5-6, stable. Upper chest: Airway is patent. Lung apices are clear. Stable nodularity of the thyroid , previously evaluated by ultrasound. Other: Reconstructed images demonstrate no additional findings. IMPRESSION: 1. No acute cervical spine fracture. Electronically Signed   By: Ozell Daring M.D.   On: 01/23/2024 21:10   CT Head Wo Contrast Result Date: 01/23/2024 CLINICAL DATA:  Clemens, head trauma EXAM: CT HEAD WITHOUT CONTRAST TECHNIQUE: Contiguous axial images were obtained from the base of the skull through the vertex without intravenous contrast. RADIATION  DOSE REDUCTION: This exam was performed according to the departmental dose-optimization program which includes automated exposure control, adjustment of the mA and/or kV according to patient size and/or use of iterative reconstruction technique. COMPARISON:  04/08/2023 FINDINGS: Brain: No acute infarct or hemorrhage. Lateral ventricles and midline structures are unremarkable. No acute extra-axial fluid collections. No mass effect. Stable calcifications along the anterior falx and tentorium. Vascular: No hyperdense vessel or unexpected calcification. Skull: Normal. Negative for fracture or focal lesion. Sinuses/Orbits: No acute finding. Other: None. IMPRESSION: 1. No acute intracranial process. Electronically Signed   By: Ozell Daring M.D.   On: 01/23/2024 21:06     Procedures   Medications Ordered in the ED - No data to display  Medical Decision Making Amount and/or Complexity of Data Reviewed Radiology: ordered.   This patient presents to the ED for concern of fall.  Differential diagnosis includes concussion, head injury, SAH, SDH, scalp hematoma    Additional history obtained:  Additional history obtained from chart review    Imaging Studies ordered:  I ordered imaging studies including CT head, CT cervical spine I independently visualized and interpreted imaging which showed negative for any acute traumatic findings I agree with the radiologist interpretation   Problem List / ED Course:  Patient presents to the emergency department following mechanical fall.  Reportedly fell after getting her feet caught in her [place].  Endorses strike to the left head.  Not on blood thinners.  No significant headache, loss of conscious, nausea, vomiting.  She is at her baseline at this time. Physical exam is reassuring with no acute findings of traumatic head injury.  There is a small hematoma to the left periorbital region around the left eyebrow.  No  laceration seen.  No tenderness to the upper or lower extremities or the abdomen.  No abnormal heart or lung sounds. Imaging of the head and neck to evaluate for injuries were thankfully negative.  No signs of skull fracture, head bleeding, or other acute injuries.  There is only of note a small hematoma to the left side that can be seen on physical exam.  Given no obvious findings of traumatic eye injury and patient at baseline, do not feel the patient requires more emergent assessment or evaluation at this time.  She endorses minimal pain and discomfort.  Will arrange for transport back to home.  Return precautions discussed such as concerns for new or worsening symptoms or recurrent falls.  Advised patient to discontinue use of [place]yer and use wheelchair as previously advised by neurology.  Patient and daughter at bedside verbalized understanding and agreement.  Discharged home in stable condition.   Social Determinants of Health:  None  Final diagnoses:  Fall, initial encounter  Traumatic hematoma of left eyebrow, initial encounter    ED Discharge Orders     None          Cecily Legrand LABOR, PA-C 01/24/24 0006    Lenor Hollering, MD 01/24/24 [phone]

## 2024-01-24 NOTE — ED Provider Notes (Incomplete)
 [place] EMERGENCY DEPARTMENT AT [facility] Provider Note   CSN: [id number] Arrival date & time: 01/23/24  8161     Patient presents with: Fall and Head Injury   Kristi Campos is a 73 y.o. female.  Patient presented to the emergency department today with concerns of a fall and head injury.  Reportedly fell after try to use her [place] and tripped over the [place].  Past history significant for type 2 diabetes, peripheral artery disease, progressive supranuclear palsy, CKD.  She reports no significant area pain but has a small hematoma to the left periorbital region.  Denies any significant headache, nausea, or visual disturbance.  She is not current on any blood thinners.   Fall  Head Injury      Prior to Admission medications   Medication Sig Start Date End Date Taking? Authorizing Provider  ACCU-CHEK GUIDE TEST test strip USE AS DIRECTED THREE TIMES DAILY 01/22/24   Vicci Barnie NOVAK, MD  AMBULATORY NON FORMULARY MEDICATION Please dispense 1 power wheelchair PSP patient DX code G23.1 06/18/22   Tat, Asberry RAMAN, DO  amLODipine  (NORVASC ) 2.5 MG tablet Take 1 tablet (2.5 mg total) by mouth daily. 11/01/23   Vicci Barnie NOVAK, MD  aspirin  EC 81 MG tablet Take 81 mg by mouth daily.    [provider]  atorvastatin  (LIPITOR) 20 MG tablet Take 1 tablet (20 mg total) by mouth daily. Dose increase 11/02/2023 11/02/23   Vicci Barnie NOVAK, MD  Blood Glucose Monitoring Suppl (ACCU-CHEK GUIDE) w/Device KIT Use to check blood sugar TID. Dx E11.69 10/30/21   Vicci Barnie NOVAK, MD  carbidopa -levodopa  (SINEMET  IR) 25-100 MG tablet Take 1 tablet by mouth 3 (three) times daily. 06/16/23   Tat, Asberry RAMAN, DO  diclofenac  Sodium (VOLTAREN ) 1 % GEL Apply 4 g topically 2 (two) times daily as needed. 10/04/22   Newlin, Enobong, MD  famotidine  (PEPCID ) 20 MG tablet Take 1 tablet (20 mg total) by mouth 2 (two) times daily. 01/09/24   Vicci Barnie NOVAK, MD  insulin  glargine (LANTUS  SOLOSTAR) 100  UNIT/ML Solostar Pen Inject 40 Units into the skin daily. 01/22/24   Vicci Barnie NOVAK, MD  Insulin  Pen Needle (B-D ULTRAFINE III SHORT PEN) 31G X 8 MM MISC USE AS DIRECTED 11/03/22   Vicci Barnie NOVAK, MD  JARDIANCE  25 MG TABS tablet TAKE 1 TABLET(25 MG) BY MOUTH DAILY 05/31/23   Vicci Barnie NOVAK, MD  lidocaine  (LIDODERM ) 5 % Place 1 patch onto the skin daily. Remove & Discard patch within 12 hours or as directed by MD 10/04/22   Delbert Clam, MD  losartan -hydrochlorothiazide (HYZAAR) 100-25 MG tablet TAKE 1/2 TABLET BY MOUTH DAILY 10/28/23   Vicci Barnie NOVAK, MD  nitroGLYCERIN  (NITROSTAT ) 0.4 MG SL tablet Place 1 tablet (0.4 mg total) under the tongue every 5 (five) minutes as needed for chest pain. 10/18/20   Krishnan, Gokul, MD  pantoprazole  (PROTONIX ) 40 MG tablet TAKE 1 TABLET BY MOUTH DAILY AT NOON 08/17/23   Vicci Barnie NOVAK, MD  simvastatin (ZOCOR) 10 MG tablet Take 10 mg by mouth at bedtime. 01/01/19 02/28/20  [provider]    Allergies: Metformin  and related    Review of Systems  HENT:         Head injury  All other systems reviewed and are negative.   Updated Vital Signs BP 138/73 (BP Location: Right Arm)   Pulse 100   Temp 98.4 F (36.9 C) (Oral)   Resp 16   SpO2 99%  Physical Exam Vitals and nursing note reviewed.  Constitutional:      General: She is not in acute distress.    Appearance: She is well-developed.  HENT:     Head: Normocephalic and atraumatic.      Comments: Small hematoma lateral to the left eyebrow. No lacerations seen. EOMS intact and PERRL. Eyes:     Conjunctiva/sclera: Conjunctivae normal.  Cardiovascular:     Rate and Rhythm: Normal rate and regular rhythm.     Heart sounds: No murmur heard. Pulmonary:     Effort: Pulmonary effort is normal. No respiratory distress.     Breath sounds: Normal breath sounds.  Abdominal:     Palpations: Abdomen is soft.     Tenderness: There is no abdominal tenderness.  Musculoskeletal:         General: No swelling or tenderness.     Cervical back: Neck supple.     Comments: No swelling, tenderness, or deformity to upper or lower extremities.  Skin:    General: Skin is warm and dry.     Capillary Refill: Capillary refill takes less than 2 seconds.  Neurological:     Mental Status: She is alert.  Psychiatric:        Mood and Affect: Mood normal.     (all labs ordered are listed, but only abnormal results are displayed) Labs Reviewed - No data to display  EKG: None  Radiology: CT Cervical Spine Wo Contrast Result Date: 01/23/2024 CLINICAL DATA:  Clemens, neck trauma EXAM: CT CERVICAL SPINE WITHOUT CONTRAST TECHNIQUE: Multidetector CT imaging of the cervical spine was performed without intravenous contrast. Multiplanar CT image reconstructions were also generated. RADIATION DOSE REDUCTION: This exam was performed according to the departmental dose-optimization program which includes automated exposure control, adjustment of the mA and/or kV according to patient size and/or use of iterative reconstruction technique. COMPARISON:  04/08/2023 FINDINGS: Alignment: Alignment is anatomic. Skull base and vertebrae: No acute fracture. No primary bone lesion or focal pathologic process. Soft tissues and spinal canal: No prevertebral fluid or swelling. No visible canal hematoma. Disc levels: Mild multilevel spondylosis greatest at C3-4 and C5-6, stable. Upper chest: Airway is patent. Lung apices are clear. Stable nodularity of the thyroid , previously evaluated by ultrasound. Other: Reconstructed images demonstrate no additional findings. IMPRESSION: 1. No acute cervical spine fracture. Electronically Signed   By: Ozell Daring M.D.   On: 01/23/2024 21:10   CT Head Wo Contrast Result Date: 01/23/2024 CLINICAL DATA:  Clemens, head trauma EXAM: CT HEAD WITHOUT CONTRAST TECHNIQUE: Contiguous axial images were obtained from the base of the skull through the vertex without intravenous contrast. RADIATION  DOSE REDUCTION: This exam was performed according to the departmental dose-optimization program which includes automated exposure control, adjustment of the mA and/or kV according to patient size and/or use of iterative reconstruction technique. COMPARISON:  04/08/2023 FINDINGS: Brain: No acute infarct or hemorrhage. Lateral ventricles and midline structures are unremarkable. No acute extra-axial fluid collections. No mass effect. Stable calcifications along the anterior falx and tentorium. Vascular: No hyperdense vessel or unexpected calcification. Skull: Normal. Negative for fracture or focal lesion. Sinuses/Orbits: No acute finding. Other: None. IMPRESSION: 1. No acute intracranial process. Electronically Signed   By: Ozell Daring M.D.   On: 01/23/2024 21:06     Procedures   Medications Ordered in the ED - No data to display  Medical Decision Making Amount and/or Complexity of Data Reviewed Radiology: ordered.   This patient presents to the ED for concern of *** differential diagnosis includes ***    Additional history obtained:  Additional history obtained from *** External records from outside source obtained and reviewed including ***   Lab Tests:  I Ordered, and personally interpreted labs.  The pertinent results include:  ***   Imaging Studies ordered:  I ordered imaging studies including ***  I independently visualized and interpreted imaging which showed *** I agree with the radiologist interpretation   Medicines ordered and prescription drug management:  I ordered medication including ***  for ***  Reevaluation of the patient after these medicines showed that the patient {resolved/improved/worsened:23923::improved} I have reviewed the patients home medicines and have made adjustments as needed   Problem List / ED Course:  ***   Social Determinants of Health:    Final diagnoses:  Fall, initial encounter  Facial  hematoma, initial encounter    ED Discharge Orders     None

## 2024-02-07 ENCOUNTER — Other Ambulatory Visit: Payer: Self-pay

## 2024-02-07 ENCOUNTER — Ambulatory Visit: Admitting: Podiatry

## 2024-02-07 NOTE — Patient Outreach (Signed)
 Aging Gracefully Program  02/07/2024  Kristi Campos 11/07/1950 [id number]   [facility] Evaluation Interviewer made contact with patient. Aging Gracefully 5 month survey completed.    [facility]  Population Health Care Management Assistant  Direct Dial : [phone]  Fax: [phone] Website: [email or web address]

## 2024-02-09 ENCOUNTER — Encounter: Payer: Self-pay | Admitting: Pharmacist

## 2024-02-09 ENCOUNTER — Ambulatory Visit: Attending: Internal Medicine | Admitting: Pharmacist

## 2024-02-09 DIAGNOSIS — E669 Obesity, unspecified: Secondary | ICD-10-CM

## 2024-02-09 DIAGNOSIS — E119 Type 2 diabetes mellitus without complications: Secondary | ICD-10-CM

## 2024-02-09 DIAGNOSIS — Z794 Long term (current) use of insulin: Secondary | ICD-10-CM

## 2024-02-09 DIAGNOSIS — Z7984 Long term (current) use of oral hypoglycemic drugs: Secondary | ICD-10-CM

## 2024-02-09 NOTE — Progress Notes (Signed)
 I connected with  Kristi Campos on 02/09/2024 by a video enabled telemedicine application and verified that I am speaking with the correct person using two identifiers.   I discussed the limitations of evaluation and management by telemedicine. The patient expressed understanding and agreed to proceed.  Location of myself: clinic office   Location of patient: home   Persons participating in the call: the patient and myself   S:     No chief complaint on file.  73 y.o. female who presents for diabetes evaluation, education, and management. 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   Patient was referred and last seen by Primary Care Provider, Dr. Vicci, on 11/01/23. At last visit, no medication changes were made. Dr. Vicci instructed patient to reduce her snacking on sweets intake and increase low glycemic index foods.   Pharmacy has seen patient for some time. Last visit with us  was on 01/09/24. A1c was back up to 8.8% at that visit. We increased her glargine insulin  to 36 units daily.   Today, patient is in good spirits. She continues to focus on incorporating improving her diet. She also endorses adherence to Jardiance  and Lantus  daily. She is injecting 36 units daily as prescribed. FBG this morning was 131 mg/dL. Overall she thinks her home sugars have improved since last visit, ranging in the 90 - 130 mg/dL range when she checks. Specifically, she denies any readings >200 mg/dL or < 70 mg/dL.   Family/Social History:  -Fhx: HTN -Tobacco: never smoked  Current diabetes medications include: Lantus  36 units subcutaneously once daily, Jardiance  25 mg Current hypertension medications include: amlodipine  2.5 mg daily, losartan -hydrochlorothiazide 100-25 mg (half a tablet a day) Current hyperlipidemia medications include: atorvastatin  20 mg  Insurance coverage: [facility] Medicare  Patient denies  hypoglycemic events.  Patient denies nocturia (nighttime urination).  Patient denies neuropathy (nerve pain). Patient denies visual changes. Patient reports self foot exams.   Patient reported dietary habits:  -Making improvements with more low glycemic index foods and Twinkies consumption decreased to 1 -Eats 2-3 meals daily -Patient doesn't eat a lot of carbs -Drinks apple and orange juice.    Patient-reported exercise habits -Patient doesn't have a current exercising regimen.   O:   Lab Results  Component Value Date   HGBA1C 8.8 (A) 01/09/2024   There were no vitals filed for this visit.  Lipid Panel     Component Value Date/Time   CHOL 177 11/01/2023 1055   TRIG 107 11/01/2023 1055   HDL 59 11/01/2023 1055   CHOLHDL 3.0 11/01/2023 1055   LDLCALC 99 11/01/2023 1055    Clinical Atherosclerotic Cardiovascular Disease (ASCVD): Yes  The 10-year ASCVD risk score (Arnett DK, et al., 2019) is: 31.4%   Values used to calculate the score:     Age: 89 years     Clinically relevant sex: Female     Is Non-Hispanic African American: Yes     Diabetic: Yes     Tobacco smoker: No     Systolic Blood Pressure: 135 mmHg     Is BP treated: Yes     HDL Cholesterol: 59 mg/dL     Total Cholesterol: 177 mg/dL   A/P: Diabetes longstanding currently uncontrolled. A1c last month of 8.8%, up from 7.6% back in August. She is tolerating the increased Lantus  dose well with no hypoglycemia reported. Patient is able to verbalize appropriate  hypoglycemia management plan. -Continue Lantus  (insulin  glargine) 36 units daily in the morning. -Continued SGLT2-I Jardiance  (empagliflozin ) 25 mg. Counseled on sick day rules. -Patient educated on purpose, proper use, and potential adverse effects of Insulin .  -Extensively discussed pathophysiology of diabetes, recommended lifestyle interventions, dietary effects on blood sugar control.  -Counseled on s/sx of and management of hypoglycemia.  -Next A1c  anticipated 04/2024.   Written patient instructions provided. Patient verbalized understanding of treatment plan.  Total time in face to face counseling 30 minutes.    Follow-up:  Pharmacist prn PCP clinic visit on 03/02/2024 with Dr. Vicci Herlene Fleeta Tonia, PharmD, BCACP, CPP Clinical Pharmacist [facility] [phone]

## 2024-02-17 ENCOUNTER — Ambulatory Visit: Admitting: Podiatry

## 2024-02-17 DIAGNOSIS — M79672 Pain in left foot: Secondary | ICD-10-CM

## 2024-02-17 DIAGNOSIS — M79671 Pain in right foot: Secondary | ICD-10-CM | POA: Diagnosis not present

## 2024-02-17 DIAGNOSIS — B351 Tinea unguium: Secondary | ICD-10-CM

## 2024-02-17 NOTE — Progress Notes (Signed)
 Patient presents for evaluation and treatment of tenderness and some redness around nails feet.  Tenderness around toes with walking and wearing shoes.  Physical exam:  General appearance: Alert, pleasant, and in no acute distress.  Vascular: Pedal pulses: DP 2/4 B/L, PT 1/4 B/L. Moderate edema lower legs bilaterally.  Capillary refill time immediate bilaterally  Neurologic:  Dermatologic:  Nails thickened, disfigured, discolored 1-5 BL with subungual debris.  Redness and hypertrophic nail folds along nail folds bilaterally but no signs of drainage or infection.  Musculoskeletal:     Diagnosis: 1. Painful onychomycotic nails 1 through 5 bilaterally. 2. Pain toes 1 through 5 bilaterally.  Plan: -Debrided onychomycotic nails 1 through 5 bilaterally.  Sharply debrided nails with nail clipper and reduced with a power bur.  Return 3 months [facility]

## 2024-03-02 ENCOUNTER — Other Ambulatory Visit: Payer: Self-pay | Admitting: Internal Medicine

## 2024-03-02 ENCOUNTER — Ambulatory Visit: Attending: Internal Medicine | Admitting: Internal Medicine

## 2024-03-02 ENCOUNTER — Encounter: Payer: Self-pay | Admitting: Internal Medicine

## 2024-03-02 VITALS — BP 120/76 | HR 75 | Temp 98.2°F | Ht 63.0 in | Wt 194.0 lb

## 2024-03-02 DIAGNOSIS — Z23 Encounter for immunization: Secondary | ICD-10-CM | POA: Diagnosis not present

## 2024-03-02 DIAGNOSIS — N1831 Chronic kidney disease, stage 3a: Secondary | ICD-10-CM | POA: Diagnosis not present

## 2024-03-02 DIAGNOSIS — E1169 Type 2 diabetes mellitus with other specified complication: Secondary | ICD-10-CM | POA: Diagnosis not present

## 2024-03-02 DIAGNOSIS — E785 Hyperlipidemia, unspecified: Secondary | ICD-10-CM | POA: Diagnosis not present

## 2024-03-02 DIAGNOSIS — G231 Progressive supranuclear ophthalmoplegia [Steele-Richardson-Olszewski]: Secondary | ICD-10-CM

## 2024-03-02 DIAGNOSIS — E669 Obesity, unspecified: Secondary | ICD-10-CM | POA: Diagnosis not present

## 2024-03-02 DIAGNOSIS — I10 Essential (primary) hypertension: Secondary | ICD-10-CM | POA: Diagnosis not present

## 2024-03-02 DIAGNOSIS — Z7984 Long term (current) use of oral hypoglycemic drugs: Secondary | ICD-10-CM | POA: Diagnosis not present

## 2024-03-02 DIAGNOSIS — Z794 Long term (current) use of insulin: Secondary | ICD-10-CM | POA: Diagnosis not present

## 2024-03-02 DIAGNOSIS — E1159 Type 2 diabetes mellitus with other circulatory complications: Secondary | ICD-10-CM

## 2024-03-02 DIAGNOSIS — E042 Nontoxic multinodular goiter: Secondary | ICD-10-CM | POA: Diagnosis not present

## 2024-03-02 NOTE — Patient Instructions (Signed)
" °  VISIT SUMMARY: Today, we reviewed your diabetes, hypertension, hyperlipidemia, and progressive supranuclear palsy management. We also discussed your recent fall and your current medications. You received a flu shot during this visit.  YOUR PLAN: -TYPE 2 DIABETES MELLITUS: Your blood sugar levels have increased, with your A1c rising to 8.8%. We have adjusted your Lantus  insulin  to 36 units daily and you should continue taking Jardiance  25 mg daily. Please bring your blood sugar log to future appointments for review. We also discussed healthier snack options and reducing your Twinkie consumption.  -STAGE 3A CHRONIC KIDNEY DISEASE: Your kidney function remains stable. It is important to avoid NSAIDs like ibuprofen , Aleve , and Advil  to protect your kidneys.  -PROGRESSIVE SUPRANUCLEAR PALSY: This condition affects your movement and balance. You should continue taking carbidopa -levodopa  three times daily and use your power wheelchair for mobility, especially at home. Continue with your weekly speech and physical therapy sessions.  -MULTINODULAR GOITER: A goiter is an enlargement of the thyroid  gland. You have a solid nodule that needs monitoring, so we have ordered a follow-up thyroid  ultrasound in February.  -GENERAL HEALTH MAINTENANCE: You received your flu shot today to help protect you from the flu this season.  INSTRUCTIONS: Please bring your blood sugar log to your next appointment. Avoid NSAIDs to protect your kidneys. Continue with your current medications and therapies. Follow up with a thyroid  ultrasound in February.                      Contains text generated by Abridge.                                 Contains text generated by Abridge.   "

## 2024-03-02 NOTE — Progress Notes (Signed)
 "   Patient ID: Kristi Campos, female    DOB: 1950/03/11  MRN: [id number]  CC: Diabetes (DM f/u. /No questions / concerns/Yes to flu vax)   Subjective: Kristi Campos is a 74 y.o. female who presents for chronic ds management. Daughter is with her and helps with hx. 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 Octavia), MNG (needs repeat US  04/2024).    Discussed the use of AI scribe software for clinical note transcription with the patient, who gave verbal consent to proceed.  History of Present Illness Kristi Campos is a 74 year old female with diabetes, hypertension, hyperlipidemia, and progressive supranuclear palsy who presents for a routine follow-up visit.  DM:  Her diabetes management includes an increase in Lantus  insulin  from 32 to 36 units when she saw clin pharmacist in Nov and Jardiance  25 mg daily. Her A1c has risen from 7.6 in August to 8.8 in November. Reports BS have improved with increase dose Lantus  insulin . Her morning blood glucose readings have been between 114 and 141, with a postprandial reading of 181. She monitors her blood sugar once daily but forgot to bring her log today.  She has reduced her Twinkie consumption from two to one per day as was discussed on last visit.  HTN:  managed with Cozaar /hydrochlorothiazide 100/25 mg, half a tablet daily, and amlodipine  2.5 mg daily. She infrequently checks her blood pressure at home and attempts to limit salt intake.  HL: she takes atorvastatin  20 mg daily. Her LDL has improved from 105 to 99, though the target is less than 70. Has other meds with her today except the Atorvastatin  but she confirmed possession of her medication and taking it daily.  She has progressive supranuclear palsy and is under the care of a neurologist Dr. Casper. She takes carbidopa -levodopa  three times daily. In November, she experienced a fall when  her foot got caught in her Zweber, resulting in an ER visit where CT scans of head and cervical spine showed no acute injury. She has a power wheelchair at home.  As her disease progresses, she continues to struggle with being able to get her words out.  She receives weekly speech and physical therapy at home.  MNG: She has multinodular goiter.  Nodule in the isthmus was of concern on last ultrasound done about a year ago.  Radiologist recommended repeat ultrasound in 1 year to follow-up on this nodule.  CKD: GFR has been in 40-50s with last 2 readings of 57 and 59. Not on NSAIDs.    Patient Active Problem List   Diagnosis Date Noted   Stage 3a chronic kidney disease (HCC) 06/24/2022   Patient has active power of attorney for health care 06/24/2022   Progressive supranuclear palsy (HCC) 01/27/2021   AKI (acute kidney injury) 10/18/2020   Chest pain 10/17/2020   Primary osteoarthritis, right wrist 02/28/2020   Peripheral arterial disease 10/03/2019   Essential hypertension 10/03/2019   Hyperlipidemia 10/03/2019   Type 2 diabetes mellitus in patient with obesity (HCC) 11/24/2018   Chronic right-sided low back pain without sciatica 10/24/2018     Medications Ordered Prior to Encounter[1]  Allergies[2]  Social History   Socioeconomic History   Marital status: Widowed    Spouse name: Not on file   Number of children: 4   Years of education: 11   Highest education level: 11th grade  Occupational History    Employer: other   Occupation: retired    Comment: we[employer] for living  Tobacco Use   Smoking status: Never   Smokeless tobacco: Never  Vaping Use   Vaping status: Never Used  Substance and Sexual Activity   Alcohol use: No    Alcohol/week: 0.0 standard drinks of alcohol   Drug use: No   Sexual activity: Not Currently    Partners: Male    Birth control/protection: Post-menopausal  Other Topics Concern   Not on file  Social History Narrative   Right handed    Lives at  home alone ONE LEVEL   Caffeine1 cup daily   Social Drivers of Health   Tobacco Use: Low Risk (02/09/2024)   Patient History    Smoking Tobacco Use: Never    Smokeless Tobacco Use: Never    Passive Exposure: Not on file  Financial Resource Strain: High Risk (06/14/2023)   Overall Financial Resource Strain (CARDIA)    Difficulty of Paying Living Expenses: Very hard  Food Insecurity: No Food Insecurity (08/17/2023)   Epic    Worried About Programme Researcher, Broadcasting/film/video in the Last Year: Never true    Ran Out of Food in the Last Year: Never true  Recent Concern: Food Insecurity - Food Insecurity Present (07/14/2023)   Hunger Vital Sign    Worried About Running Out of Food in the Last Year: Sometimes true    Ran Out of Food in the Last Year: Sometimes true  Transportation Needs: No Transportation Needs (08/17/2023)   Epic    Lack of Transportation (Medical): No    Lack of Transportation (Non-Medical): No  Physical Activity: Inactive (06/14/2023)   Exercise Vital Sign    Days of Exercise per Week: 0 days    Minutes of Exercise per Session: 0 min  Stress: No Stress Concern Present (06/14/2023)   [employer] of Occupational Health - Occupational Stress Questionnaire    Feeling of Stress : Not at all  Social Connections: Moderately Isolated (06/14/2023)   Social Connection and Isolation Panel    Frequency of Communication with Friends and Family: More than three times a week    Frequency of Social Gatherings with Friends and Family: More than three times a week    Attends Religious Services: More than 4 times per year    Active Member of [employer] or Organizations: No    Attends Banker Meetings: Never    Marital Status: Widowed  Intimate Partner Violence: Not At Risk (08/17/2023)   Epic    Fear of Current or Ex-Partner: No    Emotionally Abused: No    Physically Abused: No    Sexually Abused: No  Depression (PHQ2-9): Low Risk (11/01/2023)   Depression (PHQ2-9)    PHQ-2 Score: 1   Alcohol Screen: Low Risk (06/14/2023)   Alcohol Screen    Last Alcohol Screening Score (AUDIT): 0  Housing: Low Risk (08/17/2023)   Epic    Unable to Pay for Housing in the Last Year: No    Number of Times Moved in the Last Year: 0    Homeless in the Last Year: No  Utilities: At Risk (06/14/2023)   AHC Utilities    Threatened with loss of utilities: Yes  Health Literacy: Inadequate Health Literacy (06/14/2023)   B1300 Health Literacy    Frequency of need for help with medical instructions: Sometimes    Family History  Problem Relation Age of Onset   Bipolar disorder Mother  Hypertension Sister    Hypertension Sister    Hypertension Sister    Hypertension Sister    Hypertension Sister    Hypertension Sister    Hypertension Daughter    Diabetes Son    Prostate cancer Son    BRCA 1/2 Neg Hx    Breast cancer Neg Hx     Past Surgical History:  Procedure Laterality Date   ARTERY BIOPSY Left 11/26/2021   Procedure: BIOPSY TEMPORAL ARTERY;  Surgeon: Magda Debby SAILOR, MD;  Location: [facility] OR;  Service: Vascular;  Laterality: Left;   COLONOSCOPY     I & D EXTREMITY Right 08/09/2013   Procedure: MINOR IRRIGATION AND DEBRIDEMENT EXTREMITY;  Surgeon: Lamar Leonor Mickey LULLA, MD;  Location:  SURGERY CENTER;  Service: Orthopedics;  Laterality: Right;  long       wound class 4    ROS: Review of Systems Negative except as stated above  PHYSICAL EXAM: BP 120/76 (BP Location: Left Arm, Patient Position: Sitting, Cuff Size: Large)   Pulse 75   Temp 98.2 F (36.8 C) (Oral)   Ht 5' 3 (1.6 m)   Wt 194 lb (88 kg)   SpO2 97%   BMI 34.37 kg/m   Physical Exam  General appearance - alert, well appearing, older AAF and in no distress Mental status - pt answers questions with moderate difficulty in getting words out Mouth - moist oral mucosa Neck - supple, no significant adenopathy Chest - clear to auscultation, no wheezes, rales or rhonchi, symmetric air entry Heart - normal rate,  regular rhythm, normal S1, S2, no murmurs, rubs, clicks or gallops Musculoskeletal - pt in manual WC Extremities - no LE edema Diabetic Foot Exam - Simple   Simple Foot Form Diabetic Foot exam was performed with the following findings: Yes 03/02/2024 10:41 AM  Visual Inspection No deformities, no ulcerations, no other skin breakdown bilaterally: Yes Sensation Testing See comments: Yes Pulse Check Posterior Tibialis and Dorsalis pulse intact bilaterally: Yes Comments Some decrease sensation on plantar heels BL         Latest Ref Rng & Units 11/01/2023   10:55 AM 04/11/2023    3:15 PM 11/02/2022   11:16 AM  CMP  Glucose 70 - 99 mg/dL 877  848  841   BUN 8 - 27 mg/dL 19  15  18    Creatinine 0.57 - 1.00 mg/dL 8.95  8.98  8.80   Sodium 134 - 144 mmol/L 139  146  144   Potassium 3.5 - 5.2 mmol/L 4.3  5.0  4.4   Chloride 96 - 106 mmol/L 102  106  103   CO2 20 - 29 mmol/L 22  25  26    Calcium  8.7 - 10.3 mg/dL 89.8  9.7  89.9   Total Protein 6.0 - 8.5 g/dL 7.7   7.6   Total Bilirubin 0.0 - 1.2 mg/dL 0.5   0.4   Alkaline Phos 44 - 121 IU/L 87   82   AST 0 - 40 IU/L 14   14   ALT 0 - 32 IU/L 6   9    Lipid Panel     Component Value Date/Time   CHOL 177 11/01/2023 1055   TRIG 107 11/01/2023 1055   HDL 59 11/01/2023 1055   CHOLHDL 3.0 11/01/2023 1055   LDLCALC 99 11/01/2023 1055    CBC    Component Value Date/Time   WBC 7.2 04/11/2023 1515   WBC 5.4 04/21/2022 1329   RBC  4.44 04/11/2023 1515   RBC 4.36 04/21/2022 1329   HGB 13.4 04/11/2023 1515   HCT 41.9 04/11/2023 1515   PLT 260 04/11/2023 1515   MCV 94 04/11/2023 1515   MCH 30.2 04/11/2023 1515   MCH 30.7 04/21/2022 1329   MCHC 32.0 04/11/2023 1515   MCHC 32.9 04/21/2022 1329   RDW 13.5 04/11/2023 1515   LYMPHSABS 2.1 04/21/2022 1329   MONOABS 0.5 04/21/2022 1329   EOSABS 0.1 04/21/2022 1329   BASOSABS 0.0 04/21/2022 1329    ASSESSMENT AND PLAN: 1. Type 2 diabetes mellitus in patient with obesity (HCC)  (Primary) 2. Long term (current) use of insulin  (HCC) 3. Long term (current) use of oral hypoglycemic drugs Last A1C was not at goal. Blood sugars improved with Lantus  adjustment. Snacking on Twinkies reduced.  Continue to encourage eating healthier snacks like fruits. - Continue Lantus  insulin  36 units daily. - Continue Jardiance  25 mg daily. - Encouraged bringing blood sugar log to appointments for review.  4. Hypertension associated with diabetes (HCC) At goal.  Continue Cozaar /HCTZ 100/25 half a tablet daily and Norvasc  2.5 mg once a day.  5. Hyperlipidemia associated with type 2 diabetes mellitus (HCC) Continue pravastatin 20 mg daily.  Patient to check when she returns home to make sure that she has her medication.  6. Progressive supranuclear palsy (HCC) Patient has had falls and is prone to recurrent falls as PSP progresses.  Strongly encouraged her to use her mobility chair at home especially since she lives alone.  Physical therapy and speech therapy continues to work with her  7. Stage 3a chronic kidney disease (HCC) GFR stable in upper fifties. Advised to avoid NSAIDs.  8. Multinodular goiter Due for repeat thyroid  ultrasound in February for follow-up of nodule in the isthmus - US  THYROID ; Future  9. Need for influenza vaccination Given today    Patient was given the opportunity to ask questions.  Patient verbalized understanding of the plan and was able to repeat key elements of the plan.   This documentation was completed using Paediatric nurse.  Any transcriptional errors are unintentional.  Orders Placed This Encounter  Procedures   US  THYROID    Flu vaccine HIGH DOSE PF(Fluzone Trivalent)     Requested Prescriptions    No prescriptions requested or ordered in this encounter    Return in about 4 months (around 06/30/2024).  Barnie Louder, MD, FACP     [1]  Current Outpatient Medications on File Prior to Visit  Medication Sig  Dispense Refill   ACCU-CHEK GUIDE TEST test strip USE AS DIRECTED THREE TIMES DAILY 100 strip 2   AMBULATORY NON FORMULARY MEDICATION Please dispense 1 power wheelchair PSP patient DX code G23.1 1 Device 0   amLODipine  (NORVASC ) 2.5 MG tablet Take 1 tablet (2.5 mg total) by mouth daily. 90 tablet 1   aspirin  EC 81 MG tablet Take 81 mg by mouth daily.     atorvastatin  (LIPITOR) 20 MG tablet Take 1 tablet (20 mg total) by mouth daily. Dose increase 11/02/2023 90 tablet 1   Blood Glucose Monitoring Suppl (ACCU-CHEK GUIDE) w/Device KIT Use to check blood sugar TID. Dx E11.69 1 kit 0   carbidopa -levodopa  (SINEMET  IR) 25-100 MG tablet Take 1 tablet by mouth 3 (three) times daily. 270 tablet 2   diclofenac  Sodium (VOLTAREN ) 1 % GEL Apply 4 g topically 2 (two) times daily as needed. 100 g 1   famotidine  (PEPCID ) 20 MG tablet Take 1 tablet (20 mg total)  by mouth 2 (two) times daily. 14 tablet 0   insulin  glargine (LANTUS  SOLOSTAR) 100 UNIT/ML Solostar Pen Inject 40 Units into the skin daily. 45 mL 1   Insulin  Pen Needle (B-D ULTRAFINE III SHORT PEN) 31G X 8 MM MISC USE AS DIRECTED 100 each 2   JARDIANCE  25 MG TABS tablet TAKE 1 TABLET(25 MG) BY MOUTH DAILY 90 tablet 2   lidocaine  (LIDODERM ) 5 % Place 1 patch onto the skin daily. Remove & Discard patch within 12 hours or as directed by MD 30 patch 1   losartan -hydrochlorothiazide (HYZAAR) 100-25 MG tablet TAKE 1/2 TABLET BY MOUTH DAILY 45 tablet 1   pantoprazole  (PROTONIX ) 40 MG tablet TAKE 1 TABLET BY MOUTH DAILY AT NOON 90 tablet 2   nitroGLYCERIN  (NITROSTAT ) 0.4 MG SL tablet Place 1 tablet (0.4 mg total) under the tongue every 5 (five) minutes as needed for chest pain. (Patient not taking: Reported on 03/02/2024) 20 tablet 0   [DISCONTINUED] simvastatin (ZOCOR) 10 MG tablet Take 10 mg by mouth at bedtime.     No current facility-administered medications on file prior to visit.  [2]  Allergies Allergen Reactions   Metformin  And Related Diarrhea   "

## 2024-03-02 NOTE — Telephone Encounter (Signed)
 Requested Prescriptions  Pending Prescriptions Disp Refills   JARDIANCE  25 MG TABS tablet [Pharmacy Med Name: JARDIANCE  25MG  TABLETS] 90 tablet 1    Sig: TAKE 1 TABLET(25 MG) BY MOUTH DAILY     Endocrinology:  Diabetes - SGLT2 Inhibitors Failed - 03/02/2024  2:21 PM      Failed - Cr in normal range and within 360 days    Creatinine, Ser  Date Value Ref Range Status  11/01/2023 1.04 (H) 0.57 - 1.00 mg/dL Final         Failed - HBA1C is between 0 and 7.9 and within 180 days    HbA1c, POC (controlled diabetic range)  Date Value Ref Range Status  01/09/2024 8.8 (A) 0.0 - 7.0 % Final         Failed - eGFR in normal range and within 360 days    GFR calc Af Amer  Date Value Ref Range Status  12/06/2019 74 >59 mL/min/1.73 Final    Comment:    **[facility] currently reports eGFR in compliance with the current**   recommendations of the [employer]. [facility] will   update reporting as new guidelines are published from the NKF-ASN   Task force.    GFR, Estimated  Date Value Ref Range Status  04/21/2022 50 (L) >60 mL/min Final    Comment:    (NOTE) Calculated using the CKD-EPI Creatinine Equation (2021)    eGFR  Date Value Ref Range Status  11/01/2023 57 (L) >59 mL/min/1.73 Final         Passed - Valid encounter within last 6 months    Recent Outpatient Visits           Today Type 2 diabetes mellitus in patient with obesity [facility])   [facility] Dept Of [place]. [facility] Vicci Sober B, MD   3 weeks ago Type 2 diabetes mellitus in patient with obesity [facility])   [facility] - A Dept Of [place]. [facility] Fleeta Morris, [place] L, RPH-CPP   1 month ago Type 2 diabetes mellitus in patient with obesity [facility])   [facility] - A Dept Of [place]. [facility] Fleeta Morris, [place] L, RPH-CPP   4 months ago Type 2 diabetes mellitus with obesity   [facility]  - A Dept Of [place]. [facility] Vicci Sober B, MD   4 months ago Type 2 diabetes mellitus with obesity   [facility] - A Dept Of [place]. [facility] Fleeta Morris Garnette LITTIE, RPH-CPP

## 2024-03-16 ENCOUNTER — Telehealth: Payer: Self-pay | Admitting: Neurology

## 2024-03-16 NOTE — Telephone Encounter (Signed)
 HH ORDERS   Caller Name: [facility] Agency Name: [facility] Callback Phone #: [phone] Direct Line  Service Requested: [facility] PT recertification  (examples: OT/PT/Skilled Nursing/Social Work/Speech Therapy/Wound Care)  Frequency of Visits: 1 time a week for 8 weeks

## 2024-03-16 NOTE — Telephone Encounter (Signed)
 Gave verbal order for patient home PT

## 2024-03-22 ENCOUNTER — Telehealth: Payer: Self-pay | Admitting: Neurology

## 2024-03-22 ENCOUNTER — Other Ambulatory Visit: Payer: Self-pay

## 2024-03-22 ENCOUNTER — Ambulatory Visit
Admission: RE | Admit: 2024-03-22 | Discharge: 2024-03-22 | Disposition: A | Discharge status: Home / Self Care | Source: Ambulatory Visit | Attending: Internal Medicine | Admitting: Internal Medicine

## 2024-03-22 DIAGNOSIS — G231 Progressive supranuclear ophthalmoplegia [Steele-Richardson-Olszewski]: Secondary | ICD-10-CM

## 2024-03-22 DIAGNOSIS — E042 Nontoxic multinodular goiter: Secondary | ICD-10-CM

## 2024-03-22 DIAGNOSIS — R1319 Other dysphagia: Secondary | ICD-10-CM

## 2024-03-22 NOTE — Telephone Encounter (Signed)
 Pt's daughter  Fredie called in this morning and stated that Pt is having a hard time swallowing. Tomeka stated that Pt needs  an order for another swallow test. Please call. Thanks

## 2024-03-22 NOTE — Telephone Encounter (Signed)
 It has been ordered.

## 2024-03-22 NOTE — Telephone Encounter (Signed)
 Last swallow test done in 2024

## 2024-03-23 ENCOUNTER — Other Ambulatory Visit (HOSPITAL_COMMUNITY): Payer: Self-pay | Admitting: *Deleted

## 2024-03-23 DIAGNOSIS — R1310 Dysphagia, unspecified: Secondary | ICD-10-CM

## 2024-03-25 ENCOUNTER — Ambulatory Visit: Payer: Self-pay | Admitting: Internal Medicine

## 2024-04-19 ENCOUNTER — Encounter (HOSPITAL_COMMUNITY)

## 2024-05-16 ENCOUNTER — Ambulatory Visit: Admitting: Podiatry

## 2024-06-19 ENCOUNTER — Ambulatory Visit

## 2024-07-05 ENCOUNTER — Ambulatory Visit: Payer: Self-pay | Admitting: Internal Medicine

## 2025-01-04 ENCOUNTER — Ambulatory Visit: Admitting: Neurology
